# Patient Record
Sex: Male | Born: 1945 | Race: Black or African American | Hispanic: No | Marital: Single | Smoking: Light tobacco smoker
Health system: Southern US, Community
[De-identification: ages and names within clinical notes are randomized; demographics above are authoritative.]

## PROBLEM LIST (undated history)

## (undated) DIAGNOSIS — I10 Essential (primary) hypertension: Secondary | ICD-10-CM

## (undated) DIAGNOSIS — F0390 Unspecified dementia without behavioral disturbance: Secondary | ICD-10-CM

## (undated) DIAGNOSIS — Z978 Presence of other specified devices: Secondary | ICD-10-CM

## (undated) HISTORY — PX: CATARACT EXTRACTION, BILATERAL: SHX1313

## (undated) HISTORY — PX: KNEE SURGERY: SHX244

---

## 2020-07-03 ENCOUNTER — Ambulatory Visit (INDEPENDENT_AMBULATORY_CARE_PROVIDER_SITE_OTHER): Payer: Medicare HMO

## 2020-07-03 ENCOUNTER — Encounter (HOSPITAL_COMMUNITY): Payer: Self-pay

## 2020-07-03 ENCOUNTER — Other Ambulatory Visit: Payer: Self-pay

## 2020-07-03 ENCOUNTER — Ambulatory Visit (HOSPITAL_COMMUNITY)
Admission: EM | Admit: 2020-07-03 | Discharge: 2020-07-03 | Disposition: A | Discharge status: Home / Self Care | Payer: Medicare HMO | Attending: Family Medicine | Admitting: Family Medicine

## 2020-07-03 DIAGNOSIS — R2231 Localized swelling, mass and lump, right upper limb: Secondary | ICD-10-CM

## 2020-07-03 DIAGNOSIS — M79641 Pain in right hand: Secondary | ICD-10-CM

## 2020-07-03 HISTORY — DX: Essential (primary) hypertension: I10

## 2020-07-03 MED ORDER — PREDNISONE 20 MG PO TABS: 40.0000 mg | ORAL_TABLET | Freq: Every day | ORAL | 0 refills | Status: DC

## 2020-07-03 MED ORDER — ACETAMINOPHEN 500 MG PO TABS: ORAL_TABLET | ORAL | 0 refills | Status: DC

## 2020-07-03 NOTE — ED Provider Notes (Signed)
[facility]   [id number] 07/03/20 Arrival Time: 1135  ASSESSMENT & PLAN:  1. Right hand pain     I have personally viewed the imaging studies ordered this visit. Apparent healed/-ing 5th metacarpal fracture.  Likely arthritic hand pain also with swelling.  Begin: Meds ordered this encounter  Medications  . predniSONE (DELTASONE) 20 MG tablet    Sig: Take 2 tablets (40 mg total) by mouth daily.    Dispense:  10 tablet    Refill:  0  . acetaminophen (TYLENOL) 500 MG tablet    Sig: Take no more than 1-2 tablets every 6 hours as needed for pain.    Dispense:  20 tablet    Refill:  0     Orders Placed This Encounter  Procedures  . DG Hand Complete Right    Recommend:    Follow-up Information    Betha Loa, MD.   Specialty: Orthopedic Surgery Why: If worsening or failing to improve as anticipated. Contact information: [address] [place] [address] [phone]                Reviewed expectations re: course of current medical issues. Questions answered. Outlined signs and symptoms indicating need for more acute intervention. Patient verbalized understanding. After Visit Summary given.  SUBJECTIVE: History from: patient and caregiver. Jack Rivas is a 74 y.o. male who reports R hand pain; fall one m ago; on/off soreness since; noted swelling worse since yesterday; is painful. No extremity sensation changes or weakness. OTC pain cream without much help.   Past Surgical History:  Procedure Laterality Date  . CATARACT EXTRACTION, BILATERAL    . KNEE SURGERY        OBJECTIVE:  Vitals:   07/03/20 1308  BP: 107/64  Pulse: 64  Resp: 18  Temp: 97.8 F (36.6 C)  TempSrc: Oral  SpO2: 100%    General appearance: alert; no distress HEENT: [place]; AT Neck: supple with FROM Resp: unlabored respirations Extremities: . RUE: warm with well perfused appearance; poorly localized moderate tenderness over right dorsal proximal hand;  without gross deformities; swelling: minimal; bruising: none; wrist and all fingers ROM: normal CV: brisk extremity capillary refill of RUE; 2+ radial pulse of RUE. Skin: warm and dry; no visible rashes Neurologic: gait normal; normal sensation and strength of RUE Psychological: alert and cooperative; normal mood and affect  Imaging: DG Hand Complete Right  Result Date: 07/03/2020 CLINICAL DATA:  Trauma 1 month ago.  Swelling EXAM: RIGHT HAND - COMPLETE 3+ VIEW COMPARISON:  None. FINDINGS: No acute fracture or dislocation. Mild cortical irregularity of the fifth metacarpal consistent with the sequela of remote prior fracture. Well corticated ossific density adjacent to the ulnar styloid process consistent with sequela of remote prior trauma. Moderate degenerative changes of the first CMC and first MCP. Moderate degenerative changes of the second and third MCPs with hook osteophyte formation and subcortical cyst formation. Scattered degenerative changes throughout the DIPs greater than the PIPs, most pronounced in the fifth phalanx. Scattered subcortical cysts throughout the carpal bones. 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 No unexpected radiopaque foreign body. Vascular calcifications. Soft tissue edema. IMPRESSION: 1. No acute fracture or dislocation. 2. Multifocal degenerative changes including degenerative changes of the second and third MCPs. 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 Electronically Signed   By: Meda Klinefelter MD   On: 07/03/2020 14:05      No Known Allergies  Past Medical History:  Diagnosis Date  . Hypertension    Social History  Socioeconomic History  . Marital status: Single    Spouse name: Not on file  . Number of children: Not on file  . Years of education: Not on file  . Highest education level: Not on file  Occupational History  . Not on file  Tobacco Use  . Smoking status: Light Tobacco Smoker  . Smokeless tobacco: Never Used  Substance and Sexual  Activity  . Alcohol use: Yes  . Drug use: Never  . Sexual activity: Not on file  Other Topics Concern  . Not on file  Social History Narrative  . Not on file   Social Determinants of Health   Financial Resource Strain: Not on file  Food Insecurity: Not on file  Transportation Needs: Not on file  Physical Activity: Not on file  Stress: Not on file  Social Connections: Not on file   History reviewed. No pertinent family history. Past Surgical History:  Procedure Laterality Date  . CATARACT EXTRACTION, BILATERAL    . KNEE SURGERY        Mardella Layman, MD 07/03/20 1446

## 2020-07-03 NOTE — ED Triage Notes (Signed)
Pt presents with pain and swelling in the right wrist and right hand x 1 month. Pt states he fell 1 month ago and tried to catch himself with he hands. Pt used OTC pain creme, without relief.

## 2021-03-19 ENCOUNTER — Emergency Department (HOSPITAL_COMMUNITY): Payer: Medicare Other

## 2021-03-19 ENCOUNTER — Other Ambulatory Visit: Payer: Self-pay

## 2021-03-19 ENCOUNTER — Inpatient Hospital Stay (HOSPITAL_COMMUNITY)
Admission: EM | Admit: 2021-03-19 | Discharge: 2021-03-28 | DRG: 177 | Disposition: A | Discharge status: Skilled Nursing Facility | Payer: Medicare Other | Source: Skilled Nursing Facility | Attending: Internal Medicine | Admitting: Internal Medicine

## 2021-03-19 DIAGNOSIS — Z781 Physical restraint status: Secondary | ICD-10-CM

## 2021-03-19 DIAGNOSIS — F05 Delirium due to known physiological condition: Secondary | ICD-10-CM | POA: Diagnosis present

## 2021-03-19 DIAGNOSIS — W19XXXA Unspecified fall, initial encounter: Secondary | ICD-10-CM

## 2021-03-19 DIAGNOSIS — E162 Hypoglycemia, unspecified: Secondary | ICD-10-CM | POA: Diagnosis present

## 2021-03-19 DIAGNOSIS — Z931 Gastrostomy status: Secondary | ICD-10-CM

## 2021-03-19 DIAGNOSIS — N179 Acute kidney failure, unspecified: Secondary | ICD-10-CM | POA: Diagnosis present

## 2021-03-19 DIAGNOSIS — Z79899 Other long term (current) drug therapy: Secondary | ICD-10-CM

## 2021-03-19 DIAGNOSIS — E440 Moderate protein-calorie malnutrition: Secondary | ICD-10-CM | POA: Diagnosis present

## 2021-03-19 DIAGNOSIS — R059 Cough, unspecified: Secondary | ICD-10-CM

## 2021-03-19 DIAGNOSIS — R1310 Dysphagia, unspecified: Secondary | ICD-10-CM

## 2021-03-19 DIAGNOSIS — R5381 Other malaise: Secondary | ICD-10-CM | POA: Diagnosis present

## 2021-03-19 DIAGNOSIS — F419 Anxiety disorder, unspecified: Secondary | ICD-10-CM | POA: Diagnosis present

## 2021-03-19 DIAGNOSIS — J690 Pneumonitis due to inhalation of food and vomit: Principal | ICD-10-CM | POA: Diagnosis present

## 2021-03-19 DIAGNOSIS — Z20822 Contact with and (suspected) exposure to covid-19: Secondary | ICD-10-CM | POA: Diagnosis present

## 2021-03-19 DIAGNOSIS — Z7952 Long term (current) use of systemic steroids: Secondary | ICD-10-CM

## 2021-03-19 DIAGNOSIS — G9341 Metabolic encephalopathy: Secondary | ICD-10-CM | POA: Diagnosis present

## 2021-03-19 DIAGNOSIS — R680 Hypothermia, not associated with low environmental temperature: Secondary | ICD-10-CM | POA: Diagnosis present

## 2021-03-19 DIAGNOSIS — F0390 Unspecified dementia without behavioral disturbance: Secondary | ICD-10-CM | POA: Diagnosis present

## 2021-03-19 DIAGNOSIS — F17200 Nicotine dependence, unspecified, uncomplicated: Secondary | ICD-10-CM | POA: Diagnosis present

## 2021-03-19 DIAGNOSIS — R4182 Altered mental status, unspecified: Secondary | ICD-10-CM

## 2021-03-19 LAB — CBC
HCT: 32 % — ABNORMAL LOW (ref 39.0–52.0)
Hemoglobin: 10 g/dL — ABNORMAL LOW (ref 13.0–17.0)
MCH: 29.3 pg (ref 26.0–34.0)
MCHC: 31.3 g/dL (ref 30.0–36.0)
MCV: 93.8 fL (ref 80.0–100.0)
Platelets: 162 10*3/uL (ref 150–400)
RBC: 3.41 MIL/uL — ABNORMAL LOW (ref 4.22–5.81)
RDW: 13.3 % (ref 11.5–15.5)
WBC: 6.1 10*3/uL (ref 4.0–10.5)
nRBC: 0 % (ref 0.0–0.2)

## 2021-03-19 LAB — RESP PANEL BY RT-PCR (FLU A&B, COVID) ARPGX2
Influenza A by PCR: NEGATIVE
Influenza B by PCR: NEGATIVE
SARS Coronavirus 2 by RT PCR: NEGATIVE

## 2021-03-19 LAB — CBG MONITORING, ED: Glucose-Capillary: 107 mg/dL — ABNORMAL HIGH (ref 70–99)

## 2021-03-19 LAB — COMPREHENSIVE METABOLIC PANEL
ALT: 91 U/L — ABNORMAL HIGH (ref 0–44)
AST: 53 U/L — ABNORMAL HIGH (ref 15–41)
Albumin: 3.5 g/dL (ref 3.5–5.0)
Alkaline Phosphatase: 71 U/L (ref 38–126)
Anion gap: 8 (ref 5–15)
BUN: 52 mg/dL — ABNORMAL HIGH (ref 8–23)
CO2: 25 mmol/L (ref 22–32)
Calcium: 9.6 mg/dL (ref 8.9–10.3)
Chloride: 109 mmol/L (ref 98–111)
Creatinine, Ser: 2.07 mg/dL — ABNORMAL HIGH (ref 0.61–1.24)
GFR, Estimated: 33 mL/min — ABNORMAL LOW (ref >60)
Glucose, Bld: 97 mg/dL (ref 70–99)
Potassium: 6.2 mmol/L — ABNORMAL HIGH (ref 3.5–5.1)
Sodium: 142 mmol/L (ref 135–145)
Total Bilirubin: 0.8 mg/dL (ref 0.3–1.2)
Total Protein: 8.1 g/dL (ref 6.5–8.1)

## 2021-03-19 LAB — LIPASE, BLOOD: Lipase: 30 U/L (ref 11–51)

## 2021-03-19 LAB — LACTIC ACID, PLASMA: Lactic Acid, Venous: 1.1 mmol/L (ref 0.5–1.9)

## 2021-03-19 MED ORDER — IOHEXOL 350 MG/ML SOLN
70.0000 mL | Freq: Once | INTRAVENOUS | Status: AC | PRN
  Administered 2021-03-20: 70 mL via INTRAVENOUS

## 2021-03-19 NOTE — ED Provider Notes (Signed)
[facility]-EMERGENCY DEPT Provider Note   CSN: [id number] Arrival date & time: 03/19/21  2050     History Chief Complaint  Patient presents with   Aspiration    Jack Rivas is a 75 y.o. male.  The history is provided by medical records. The history is limited by the condition of the patient. No language interpreter was used.  Cough Cough characteristics:  Harsh Severity:  Severe Timing:  Unable to specify Progression:  Unable to specify Relieved by:  Nothing Worsened by:  Nothing Ineffective treatments:  None tried Associated symptoms: no fever and no rash    LVL 5 caveat for AMS     Past Medical History:  Diagnosis Date   Hypertension     There are no problems to display for this patient.   Past Surgical History:  Procedure Laterality Date   CATARACT EXTRACTION, BILATERAL     KNEE SURGERY         No family history on file.  Social History   Tobacco Use   Smoking status: Light Smoker   Smokeless tobacco: Never  Substance Use Topics   Alcohol use: Yes   Drug use: Never    Home Medications Prior to Admission medications   Medication Sig Start Date End Date Taking? Authorizing Provider  acetaminophen (TYLENOL) 500 MG tablet Take no more than 1-2 tablets every 6 hours as needed for pain. 07/03/20   Mardella Layman, MD  amLODipine (NORVASC) 10 MG tablet Take 10 mg by mouth daily. 05/08/20   [provider]  losartan (COZAAR) 50 MG tablet Take 50 mg by mouth daily. 05/12/20   [provider]  predniSONE (DELTASONE) 20 MG tablet Take 2 tablets (40 mg total) by mouth daily. 07/03/20   Mardella Layman, MD    Allergies    Patient has no known allergies.  Review of Systems   Review of Systems  Unable to perform ROS: Mental status change  Constitutional:  Negative for fever.  Respiratory:  Positive for cough.   Gastrointestinal:  Positive for abdominal pain. Negative for nausea and vomiting.  Skin:  Negative for rash  and wound.  Neurological:  Positive for speech difficulty.  Psychiatric/Behavioral:  Negative for agitation.    Physical Exam Updated Vital Signs BP (!) 188/90 (BP Location: Right Arm)   Pulse 80   Temp 98 F (36.7 C) (Oral)   Resp (!) 22   SpO2 99%   Physical Exam Vitals and nursing note reviewed.  Constitutional:      Appearance: He is well-developed.  HENT:     Head: Normocephalic and atraumatic.     Nose: No congestion.     Mouth/Throat:     Mouth: Mucous membranes are moist.  Eyes:     Extraocular Movements: Extraocular movements intact.     Conjunctiva/sclera: Conjunctivae normal.     Pupils: Pupils are equal, round, and reactive to light.  Cardiovascular:     Rate and Rhythm: Normal rate and regular rhythm.     Heart sounds: No murmur heard. Pulmonary:     Effort: Pulmonary effort is normal. No respiratory distress.     Breath sounds: Normal breath sounds.  Abdominal:     General: Abdomen is flat.     Palpations: Abdomen is soft.     Tenderness: There is abdominal tenderness. There is no guarding or rebound.     Comments: Peg tube in place  Musculoskeletal:        General: No tenderness.  Cervical back: Neck supple. No tenderness.  Skin:    General: Skin is warm and dry.  Neurological:     Mental Status: He is alert.     GCS: GCS eye subscore is 4. GCS verbal subscore is 1. GCS motor subscore is 6.     Comments: Patient is essentially nonverbal at this time and family reports this is new for him over the last few days.  Patient appears to have sensation all extremities and would move all extremities for me    ED Results / Procedures / Treatments   Labs (all labs ordered are listed, but only abnormal results are displayed) Labs Reviewed  COMPREHENSIVE METABOLIC PANEL - Abnormal; Notable for the following components:      Result Value   Potassium 6.2 (*)    BUN 52 (*)    Creatinine, Ser 2.07 (*)    AST 53 (*)    ALT 91 (*)    GFR, Estimated 33 (*)     All other components within normal limits  CBC - Abnormal; Notable for the following components:   RBC 3.41 (*)    Hemoglobin 10.0 (*)    HCT 32.0 (*)    All other components within normal limits  TSH - Abnormal; Notable for the following components:   TSH 5.644 (*)    All other components within normal limits  CBG MONITORING, ED - Abnormal; Notable for the following components:   Glucose-Capillary 107 (*)    All other components within normal limits  RESP PANEL BY RT-PCR (FLU A&B, COVID) ARPGX2  CULTURE, BLOOD (ROUTINE X 2)  CULTURE, BLOOD (ROUTINE X 2)  URINE CULTURE  LACTIC ACID, PLASMA  LIPASE, BLOOD  URINALYSIS, ROUTINE W REFLEX MICROSCOPIC    EKG EKG Interpretation  Date/Time:  Saturday March 19 2021 22:37:29 EDT Ventricular Rate:  74 PR Interval:    QRS Duration: 113 QT Interval:  377 QTC Calculation: 419 R Axis:   5 Text Interpretation: Atrial fibrillation Borderline intraventricular conduction delay Borderline abnrm T, anterolateral leads NO prior ECG for comparison. No STEMI Confirmed by Jack Rivas ([id number]) on 03/19/2021 10:44:13 PM  Radiology DG Chest Portable 1 View  Result Date: 03/19/2021 CLINICAL DATA:  Concern for aspiration. Possible altered mental status. Abdominal pain. Lethargy. Cough. EXAM: PORTABLE CHEST 1 VIEW COMPARISON:  None. FINDINGS: Lung volumes are low. The heart is enlarged. Aortic tortuosity. Minor atelectasis at the right lung base. No confluent consolidation. No pleural effusion or pneumothorax. No pulmonary edema. Left humeral head arthroplasty is partially included. IMPRESSION: Low lung volumes with mild atelectasis at the right lung base. Electronically Signed   By: Narda Rutherford M.D.   On: 03/19/2021 22:52    Procedures Procedures   Medications Ordered in ED Medications  iohexol (OMNIPAQUE) 350 MG/ML injection 70 mL (70 mLs Intravenous Contrast Given 03/20/21 0001)    ED Course  I have reviewed the triage vital signs and the  nursing notes.  Pertinent labs & imaging results that were available during my care of the patient were reviewed by me and considered in my medical decision making (see chart for details).    MDM Rules/Calculators/A&P                           Jack Rivas is a 75 y.o. male with a past medical history significant for hypertension, dementia, dysphagia with PEG tube who presents from [facility] facility for concern of cough/aspiration and altered mental  status.  According to EMS report, patient was found to be altered from his baseline but it is unclear the context.  The facility was concerned about coughing and that he may have aspirated as he was fed something orally.  He does have a PEG tube.  Patient is unable to answer any questions and is unclear what his baseline is.  I called the facility multiple times and unfortunately was unable to speak to someone who could provide further information.  On exam, lungs are coarse bilaterally.  Chest is nontender but abdomen is very tender diffusely.  He does have a PEG tube that appears to be in place with no significant drainage surrounding.  Patient is moving extremities and has pupils that are 3 mm and reactive bilaterally.  He is not speaking.  He is keeping his mouth open and was gurgling saliva.  This is concerning for ongoing aspiration.  We were able to suction out his mouth and this improved however he still not answer any questions.  Clinically I am concerned that if he had anything his mouth may have aspirated so we will get a chest x-ray.  With his abdominal tenderness on exam and there is question of altered mental status, will order CT scan to further evaluate given his PEG tube.  We will get other basic labs, COVID swab, and work-up started.  Will start work-up for questionable altered mental status and possible aspiration without much other information at this time  11:01 PM The patient's daughter arrived who she reports is the  healthcare power of attorney.  She says that when patient was discharged from [facility] 5 days ago and went to [facility], he was conversant able to answer questions.  She does say that his dementia makes it hard to talk at times but he normally is conversational.  Per daughter, patient is now not answering any questions and is not his normal speech pattern or baseline.  We will get a head CT.  The last time she spoke with him was several days ago she reports.  She also says that although he has a DNR piece of paper signed with him, he is not DNR and she does not know anything about this paperwork.  She reports that the last time she spoke to him about this he wanted to be full code.  She subsequently remove the DNR form from the room.  As she reports she is the healthcare power attorney and says that he is not DNR, patient will remain full code.  She is concerned that she has seen patient eating food that he was not supposed to.  She says that he is supposed to be using his PEG tube and can only certain foods that are thickened.  It is not clear what thickened foods he is allowed to eat.  She suspects that he is eating candy and may have choked or aspirated.  Given our concern for aspiration, will continue to get chest x-ray as well as a CT abdomen pelvis given the tenderness.  We will add on a head CT to look for acute intracranial abnormality however anticipate he will need admission for altered mental status, speech abnormalities, and presumed aspiration.  Due to the extremely coarse breath sounds and the discomfort in the upper abdomen and diffuse in the abdomen, will get CT chest/abdomen/pelvis to look for evidence of pneumonia, further aspiration, or other abnormalities.  Care transferred to oncoming team while waiting for work-up.  Anticipate admission for  altered mental status and possible aspiration after work-up is completed.   Final Clinical Impression(s) / ED Diagnoses Final diagnoses:   Cough  Altered mental status, unspecified altered mental status type  Fall, initial encounter    Clinical Impression: 1. Cough   2. Altered mental status, unspecified altered mental status type   3. Fall, initial encounter     Disposition: Care transferred to oncoming team while waiting for work-up.  Anticipate admission for altered mental status and possible aspiration after work-up is completed.  This note was prepared with assistance of Conservation officer, historic buildingsDragon voice recognition software. Occasional wrong-word or sound-a-like substitutions may have occurred due to the inherent limitations of voice recognition software.     Caprice Wasko, Canary Brimhristopher J, MD 03/20/21 [phone]

## 2021-03-19 NOTE — ED Triage Notes (Signed)
PT BIB EMS from SNF [facility]) c/o aspiration. Per report pt was found lethargic in his room and have a horrible cough. Per report pt was on soft diet in facility and unsure if they're feeding the pt orally or via G tube. Pt poor historian. Pt hx of dementia.   CBG 178 BP 160/94 HR 78 RR 22 O2sat 100%

## 2021-03-20 ENCOUNTER — Inpatient Hospital Stay (HOSPITAL_COMMUNITY): Payer: Medicare Other

## 2021-03-20 DIAGNOSIS — Z781 Physical restraint status: Secondary | ICD-10-CM | POA: Diagnosis not present

## 2021-03-20 DIAGNOSIS — Z7189 Other specified counseling: Secondary | ICD-10-CM | POA: Diagnosis not present

## 2021-03-20 DIAGNOSIS — Z20822 Contact with and (suspected) exposure to covid-19: Secondary | ICD-10-CM | POA: Diagnosis present

## 2021-03-20 DIAGNOSIS — F419 Anxiety disorder, unspecified: Secondary | ICD-10-CM | POA: Diagnosis present

## 2021-03-20 DIAGNOSIS — Z931 Gastrostomy status: Secondary | ICD-10-CM | POA: Diagnosis not present

## 2021-03-20 DIAGNOSIS — R680 Hypothermia, not associated with low environmental temperature: Secondary | ICD-10-CM | POA: Diagnosis present

## 2021-03-20 DIAGNOSIS — N179 Acute kidney failure, unspecified: Secondary | ICD-10-CM | POA: Diagnosis present

## 2021-03-20 DIAGNOSIS — G9341 Metabolic encephalopathy: Secondary | ICD-10-CM | POA: Diagnosis present

## 2021-03-20 DIAGNOSIS — R1310 Dysphagia, unspecified: Secondary | ICD-10-CM | POA: Diagnosis not present

## 2021-03-20 DIAGNOSIS — J690 Pneumonitis due to inhalation of food and vomit: Secondary | ICD-10-CM

## 2021-03-20 DIAGNOSIS — F17200 Nicotine dependence, unspecified, uncomplicated: Secondary | ICD-10-CM | POA: Diagnosis present

## 2021-03-20 DIAGNOSIS — E162 Hypoglycemia, unspecified: Secondary | ICD-10-CM | POA: Diagnosis present

## 2021-03-20 DIAGNOSIS — Z79899 Other long term (current) drug therapy: Secondary | ICD-10-CM | POA: Diagnosis not present

## 2021-03-20 DIAGNOSIS — F0390 Unspecified dementia without behavioral disturbance: Secondary | ICD-10-CM | POA: Diagnosis present

## 2021-03-20 DIAGNOSIS — E440 Moderate protein-calorie malnutrition: Secondary | ICD-10-CM | POA: Diagnosis present

## 2021-03-20 DIAGNOSIS — Z7952 Long term (current) use of systemic steroids: Secondary | ICD-10-CM | POA: Diagnosis not present

## 2021-03-20 DIAGNOSIS — F05 Delirium due to known physiological condition: Secondary | ICD-10-CM | POA: Diagnosis present

## 2021-03-20 DIAGNOSIS — R5381 Other malaise: Secondary | ICD-10-CM | POA: Diagnosis present

## 2021-03-20 DIAGNOSIS — R059 Cough, unspecified: Secondary | ICD-10-CM | POA: Diagnosis present

## 2021-03-20 DIAGNOSIS — F0391 Unspecified dementia with behavioral disturbance: Secondary | ICD-10-CM | POA: Diagnosis not present

## 2021-03-20 HISTORY — DX: Pneumonitis due to inhalation of food and vomit: J69.0

## 2021-03-20 LAB — COMPREHENSIVE METABOLIC PANEL
ALT: 87 U/L — ABNORMAL HIGH (ref 0–44)
AST: 43 U/L — ABNORMAL HIGH (ref 15–41)
Albumin: 3.3 g/dL — ABNORMAL LOW (ref 3.5–5.0)
Alkaline Phosphatase: 67 U/L (ref 38–126)
Anion gap: 6 (ref 5–15)
BUN: 47 mg/dL — ABNORMAL HIGH (ref 8–23)
CO2: 25 mmol/L (ref 22–32)
Calcium: 9.3 mg/dL (ref 8.9–10.3)
Chloride: 111 mmol/L (ref 98–111)
Creatinine, Ser: 1.88 mg/dL — ABNORMAL HIGH (ref 0.61–1.24)
GFR, Estimated: 37 mL/min — ABNORMAL LOW (ref >60)
Glucose, Bld: 66 mg/dL — ABNORMAL LOW (ref 70–99)
Potassium: 5.2 mmol/L — ABNORMAL HIGH (ref 3.5–5.1)
Sodium: 142 mmol/L (ref 135–145)
Total Bilirubin: 0.4 mg/dL (ref 0.3–1.2)
Total Protein: 7.6 g/dL (ref 6.5–8.1)

## 2021-03-20 LAB — CBC
HCT: 30.3 % — ABNORMAL LOW (ref 39.0–52.0)
Hemoglobin: 9.8 g/dL — ABNORMAL LOW (ref 13.0–17.0)
MCH: 29.5 pg (ref 26.0–34.0)
MCHC: 32.3 g/dL (ref 30.0–36.0)
MCV: 91.3 fL (ref 80.0–100.0)
Platelets: 150 10*3/uL (ref 150–400)
RBC: 3.32 MIL/uL — ABNORMAL LOW (ref 4.22–5.81)
RDW: 13.2 % (ref 11.5–15.5)
WBC: 5.2 10*3/uL (ref 4.0–10.5)
nRBC: 0 % (ref 0.0–0.2)

## 2021-03-20 LAB — URINALYSIS, ROUTINE W REFLEX MICROSCOPIC
Bacteria, UA: NONE SEEN
Bilirubin Urine: NEGATIVE
Glucose, UA: NEGATIVE mg/dL
Ketones, ur: NEGATIVE mg/dL
Leukocytes,Ua: NEGATIVE
Nitrite: NEGATIVE
Protein, ur: 30 mg/dL — AB
RBC / HPF: >50 RBC/hpf — ABNORMAL HIGH (ref 0–5)
Specific Gravity, Urine: <1.005 — ABNORMAL LOW (ref 1.005–1.030)
pH: 7 (ref 5.0–8.0)

## 2021-03-20 LAB — GLUCOSE, CAPILLARY
Glucose-Capillary: 63 mg/dL — ABNORMAL LOW (ref 70–99)
Glucose-Capillary: 73 mg/dL (ref 70–99)
Glucose-Capillary: 76 mg/dL (ref 70–99)

## 2021-03-20 LAB — BLOOD GAS, VENOUS
Acid-Base Excess: 0.4 mmol/L (ref 0.0–2.0)
Bicarbonate: 26.7 mmol/L (ref 20.0–28.0)
O2 Saturation: 49.1 %
Patient temperature: 98.6
pCO2, Ven: 54.8 mmHg (ref 44.0–60.0)
pH, Ven: 7.308 (ref 7.250–7.430)
pO2, Ven: 29 mmHg — CL (ref 32.0–45.0)

## 2021-03-20 LAB — AMMONIA: Ammonia: 17 umol/L (ref 9–35)

## 2021-03-20 LAB — TSH: TSH: 5.644 u[IU]/mL — ABNORMAL HIGH (ref 0.350–4.500)

## 2021-03-20 LAB — STREP PNEUMONIAE URINARY ANTIGEN: Strep Pneumo Urinary Antigen: NEGATIVE

## 2021-03-20 LAB — POTASSIUM: Potassium: 5 mmol/L (ref 3.5–5.1)

## 2021-03-20 LAB — HIV ANTIBODY (ROUTINE TESTING W REFLEX): HIV Screen 4th Generation wRfx: NONREACTIVE

## 2021-03-20 LAB — T4, FREE: Free T4: 0.85 ng/dL (ref 0.61–1.12)

## 2021-03-20 MED ORDER — SODIUM ZIRCONIUM CYCLOSILICATE 10 G PO PACK
10.0000 g | PACK | Freq: Every day | ORAL | Status: DC
  Administered 2021-03-20: 10 g
  Filled 2021-03-20: qty 1

## 2021-03-20 MED ORDER — HEPARIN SODIUM (PORCINE) 5000 UNIT/ML IJ SOLN
5000.0000 [IU] | Freq: Three times a day (TID) | INTRAMUSCULAR | Status: DC
  Administered 2021-03-20 – 2021-03-28 (×24): 5000 [IU] via SUBCUTANEOUS
  Filled 2021-03-20 (×25): qty 1

## 2021-03-20 MED ORDER — MELATONIN 3 MG PO TABS
6.0000 mg | ORAL_TABLET | Freq: Every day | ORAL | Status: DC
  Administered 2021-03-20 – 2021-03-27 (×8): 6 mg
  Filled 2021-03-20 (×7): qty 2

## 2021-03-20 MED ORDER — SODIUM CHLORIDE 0.9 % IV SOLN
500.0000 mg | INTRAVENOUS | Status: AC
  Administered 2021-03-21 – 2021-03-24 (×4): 500 mg via INTRAVENOUS
  Filled 2021-03-20 (×4): qty 500

## 2021-03-20 MED ORDER — HALOPERIDOL LACTATE 5 MG/ML IJ SOLN
2.0000 mg | Freq: Once | INTRAMUSCULAR | Status: AC
  Administered 2021-03-20: 2 mg via INTRAVENOUS
  Filled 2021-03-20: qty 1

## 2021-03-20 MED ORDER — MELATONIN 3 MG PO TABS
6.0000 mg | ORAL_TABLET | Freq: Every day | ORAL | Status: DC
  Filled 2021-03-20: qty 2

## 2021-03-20 MED ORDER — FAMOTIDINE 20 MG PO TABS
20.0000 mg | ORAL_TABLET | Freq: Every day | ORAL | Status: DC
  Administered 2021-03-20 – 2021-03-27 (×8): 20 mg
  Filled 2021-03-20 (×8): qty 1

## 2021-03-20 MED ORDER — LOSARTAN POTASSIUM 50 MG PO TABS
50.0000 mg | ORAL_TABLET | Freq: Every day | ORAL | Status: DC
  Administered 2021-03-20: 50 mg

## 2021-03-20 MED ORDER — AMLODIPINE BESYLATE 10 MG PO TABS
10.0000 mg | ORAL_TABLET | Freq: Every day | ORAL | Status: DC
  Administered 2021-03-20: 10 mg

## 2021-03-20 MED ORDER — SODIUM CHLORIDE 0.9 % IV SOLN
1.0000 g | INTRAVENOUS | Status: AC
  Administered 2021-03-21 – 2021-03-24 (×4): 1 g via INTRAVENOUS
  Filled 2021-03-20: qty 1
  Filled 2021-03-20 (×3): qty 10

## 2021-03-20 MED ORDER — OSMOLITE 1.2 CAL PO LIQD
1000.0000 mL | ORAL | Status: DC
  Administered 2021-03-20 – 2021-03-21 (×3): 1000 mL
  Filled 2021-03-20 (×2): qty 1000

## 2021-03-20 MED ORDER — SODIUM CHLORIDE 0.9 % IV SOLN
2.0000 g | Freq: Once | INTRAVENOUS | Status: AC
  Administered 2021-03-20: 2 g via INTRAVENOUS
  Filled 2021-03-20: qty 20

## 2021-03-20 MED ORDER — AMLODIPINE BESYLATE 10 MG PO TABS
10.0000 mg | ORAL_TABLET | Freq: Every day | ORAL | Status: DC
  Filled 2021-03-20: qty 1

## 2021-03-20 MED ORDER — LOSARTAN POTASSIUM 50 MG PO TABS
50.0000 mg | ORAL_TABLET | Freq: Every day | ORAL | Status: DC
  Filled 2021-03-20: qty 1

## 2021-03-20 MED ORDER — QUETIAPINE FUMARATE 50 MG PO TABS
25.0000 mg | ORAL_TABLET | Freq: Every day | ORAL | Status: DC
  Administered 2021-03-20: 25 mg

## 2021-03-20 MED ORDER — POLYETHYLENE GLYCOL 3350 17 G PO PACK
17.0000 g | PACK | Freq: Two times a day (BID) | ORAL | Status: DC
  Administered 2021-03-20 – 2021-03-26 (×14): 17 g
  Filled 2021-03-20 (×14): qty 1

## 2021-03-20 MED ORDER — SODIUM CHLORIDE 0.9 % IV SOLN
500.0000 mg | Freq: Once | INTRAVENOUS | Status: AC
  Administered 2021-03-20: 500 mg via INTRAVENOUS
  Filled 2021-03-20: qty 500

## 2021-03-20 MED ORDER — SODIUM CHLORIDE 0.9 % IV SOLN: INTRAVENOUS | Status: DC

## 2021-03-20 MED ORDER — ORAL CARE MOUTH RINSE
15.0000 mL | Freq: Two times a day (BID) | OROMUCOSAL | Status: DC
  Administered 2021-03-20 – 2021-03-28 (×9): 15 mL via OROMUCOSAL

## 2021-03-20 MED ORDER — QUETIAPINE FUMARATE 25 MG PO TABS
25.0000 mg | ORAL_TABLET | Freq: Every day | ORAL | Status: DC
  Filled 2021-03-20: qty 1

## 2021-03-20 MED ORDER — CHLORHEXIDINE GLUCONATE 0.12 % MT SOLN
15.0000 mL | Freq: Two times a day (BID) | OROMUCOSAL | Status: DC
  Administered 2021-03-20 – 2021-03-28 (×14): 15 mL via OROMUCOSAL
  Filled 2021-03-20 (×12): qty 15

## 2021-03-20 NOTE — ED Notes (Signed)
Patient resting quietly at this time.  Respirations even and unlabored.  Vital signs stable at this time

## 2021-03-20 NOTE — Plan of Care (Signed)

## 2021-03-20 NOTE — Progress Notes (Signed)
Date and time results received: 03/20/21 0825   Test: ABG Po2 Critical Value: 29  Name of Provider Notified: Ghimire  Orders Received? Or Actions Taken?: Actions Taken: O2 at 2L

## 2021-03-20 NOTE — Plan of Care (Signed)

## 2021-03-20 NOTE — H&P (Signed)
TRH H&P    Patient Demographics:    Jack Rivas, is a 75 y.o. male  MRN: [id number]  DOB - 1946-01-26  Admit Date - 03/19/2021  Referring MD/NP/PA: Blinda Leatherwood  Outpatient Primary MD for the patient is Patient, No Pcp Per (Inactive)  Patient coming from: [facility]  Chief complaint- altered mental status   HPI:    Jack Rivas  is a 75 y.o. male, with history of hypertension, dementia, dysphagia with PEG tube presents the ED with a chief complaint of altered mental status.  Patient is nonverbal at this time.  History is obtained from chart.  Apparently at the facility, which she has been at less than a week after being discharged from [facility], patient started experiencing cough and altered mental status.  According to EMS patient was found to be altered from his baseline but is unclear what his baseline is.  Facility was concerned about the cough because they fear he may have aspirated.  Patient had abdominal tenderness on exam with ED provider and with myself.  CT chest abdomen pelvis shows cardiomegaly, mild groundglass opacities in the right lower lobe, colonic diverticulosis, moderate stool burden, no acute findings in the abdomen or pelvis.  Patient is afebrile, did not have a leukocytosis, lactic acid was normal.  At first patient was very somnolent in the ED.  Later he became very agitated and combative requiring a dose of Haldol.  It took several people to even just draw blood.  His chemistry panel showed an elevated BUN at 52 and an elevated creatinine at 2.07.  Last creatinine was 1.93, and baseline seems to be between 1.7 and 1.9.  BUN seems to be around 50 at baseline.  Patient had negative COVID and flu.  AST and ALT were mildly elevated at 53 and 91.  Patient has not been hypoxic in the ED.  Admission requested for further work-up of acute metabolic encephalopathy.    Review of systems:    By unfortunately  review of systems cannot be obtained secondary to patient's altered mental status    Past History of the following :    Past Medical History:  Diagnosis Date   Hypertension       Past Surgical History:  Procedure Laterality Date   CATARACT EXTRACTION, BILATERAL     KNEE SURGERY        Social History:      Social History   Tobacco Use   Smoking status: Light Smoker   Smokeless tobacco: Never  Substance Use Topics   Alcohol use: Yes       Family History :    No family history on file. Unfortunately family history could not be reviewed due to patient's altered mental status   Home Medications:   Prior to Admission medications   Medication Sig Start Date End Date Taking? Authorizing Provider  acetaminophen (TYLENOL) 500 MG tablet Take no more than 1-2 tablets every 6 hours as needed for pain. 07/03/20   Mardella Layman, MD  amLODipine (NORVASC) 10 MG tablet Take 10 mg by  mouth daily. 05/08/20   [provider]  losartan (COZAAR) 50 MG tablet Take 50 mg by mouth daily. 05/12/20   [provider]  predniSONE (DELTASONE) 20 MG tablet Take 2 tablets (40 mg total) by mouth daily. 07/03/20   Mardella LaymanHagler, Brian, MD     Allergies:    No Known Allergies   Physical Exam:   Vitals  Blood pressure (!) 147/59, pulse (!) 56, temperature (!) 97.5 F (36.4 C), temperature source Axillary, resp. rate 15, height 6\' 3"  (1.905 m), weight 85.4 kg, SpO2 98 %.  1.  General: Patient lying supine in bed,  no acute distress   2. Psychiatric: Somnolent and nonverbal,    3. Neurologic: Incomprehensible moaning, face is symmetric, equal strength in upper extremities bilaterally when resisting my exam, not following commands to move lower extremities,    4. HEENMT:  Head is atraumatic, normocephalic, pupils reactive to light, neck is supple, trachea is midline, mucous membranes are mildly dry   5. Respiratory : Lungs with mild rhonchi, no rales, no cyanosis, no  increase in work of breathing or accessory muscle use   6. Cardiovascular : Heart rate normal, rhythm is regular, no murmurs, rubs or gallops, no peripheral edema, peripheral pulses palpated   7. Gastrointestinal:  Abdomen is soft, nondistended but tender to palpation bowel sounds active, no masses or organomegaly palpated, PEG tube in place-dressing clean dry   8. Skin:  Skin is warm, dry and intact without rashes, acute lesions, or ulcers on limited exam   9.Musculoskeletal:  No acute deformities or trauma, no asymmetry in tone, no peripheral edema, peripheral pulses palpated, no tenderness to palpation in the extremities     Data Review:    CBC Recent Labs  Lab 03/19/21 2209  WBC 6.1  HGB 10.0*  HCT 32.0*  PLT 162  MCV 93.8  MCH 29.3  MCHC 31.3  RDW 13.3   ------------------------------------------------------------------------------------------------------------------  Results for orders placed or performed during the hospital encounter of 03/19/21 (from the past 48 hour(s))  Comprehensive metabolic panel     Status: Abnormal   Collection Time: 03/19/21 10:09 PM  Result Value Ref Range   Sodium 142 135 - 145 mmol/L   Potassium 6.2 (H) 3.5 - 5.1 mmol/L   Chloride 109 98 - 111 mmol/L   CO2 25 22 - 32 mmol/L   Glucose, Bld 97 70 - 99 mg/dL    Comment: Glucose reference range applies only to samples taken after fasting for at least 8 hours.   BUN 52 (H) 8 - 23 mg/dL   Creatinine, Ser 1.612.07 (H) 0.61 - 1.24 mg/dL   Calcium 9.6 8.9 - 09.610.3 mg/dL   Total Protein 8.1 6.5 - 8.1 g/dL   Albumin 3.5 3.5 - 5.0 g/dL   AST 53 (H) 15 - 41 U/L   ALT 91 (H) 0 - 44 U/L   Alkaline Phosphatase 71 38 - 126 U/L   Total Bilirubin 0.8 0.3 - 1.2 mg/dL   GFR, Estimated 33 (L) >60 mL/min    Comment: (NOTE) Calculated using the CKD-EPI Creatinine Equation (2021)    Anion gap 8 5 - 15    Comment: Performed at [facility], [address] [place], [place] [id number]   CBC     Status: Abnormal   Collection Time: 03/19/21 10:09 PM  Result Value Ref Range   WBC 6.1 4.0 - 10.5 K/uL   RBC 3.41 (L) 4.22 - 5.81 MIL/uL   Hemoglobin 10.0 (L)  13.0 - 17.0 g/dL   HCT 03.4 (L) 74.2 - 59.5 %   MCV 93.8 80.0 - 100.0 fL   MCH 29.3 26.0 - 34.0 pg   MCHC 31.3 30.0 - 36.0 g/dL   RDW 63.8 75.6 - 43.3 %   Platelets 162 150 - 400 K/uL   nRBC 0.0 0.0 - 0.2 %    Comment: Performed at [facility], [address] [place], [place] [address]  Lipase, blood     Status: None   Collection Time: 03/19/21 10:09 PM  Result Value Ref Range   Lipase 30 11 - 51 U/L    Comment: Performed at [facility], [address], [place], [place] [address]  Resp Panel by RT-PCR (Flu A&B, Covid) Nasopharyngeal Swab     Status: None   Collection Time: 03/19/21 10:09 PM   Specimen: Nasopharyngeal Swab; Nasopharyngeal(NP) swabs in vial transport medium  Result Value Ref Range   SARS Coronavirus 2 by RT PCR NEGATIVE NEGATIVE    Comment: (NOTE) SARS-CoV-2 target nucleic acids are NOT DETECTED.  The SARS-CoV-2 RNA is generally detectable in upper respiratory specimens during the acute phase of infection. The lowest concentration of SARS-CoV-2 viral copies this assay can detect is 138 copies/mL. A negative result does not preclude SARS-Cov-2 infection and should not be used as the sole basis for treatment or other patient management decisions. A negative result may occur with  improper specimen collection/handling, submission of specimen other than nasopharyngeal swab, presence of viral mutation(s) within the areas targeted by this assay, and inadequate number of viral copies(<138 copies/mL). A negative result must be combined with clinical observations, patient history, and epidemiological information. The expected result is Negative.  Fact Sheet for Patients:  BloggerCourse.com  Fact Sheet for Healthcare Providers:   SeriousBroker.it  This test is no t yet approved or cleared by the [place] FDA and  has been authorized for detection and/or diagnosis of SARS-CoV-2 by FDA under an Emergency Use Authorization (EUA). This EUA will remain  in effect (meaning this test can be used) for the duration of the COVID-19 declaration under Section 564(b)(1) of the Act, 21 U.S.C.section 360bbb-3(b)(1), unless the authorization is terminated  or revoked sooner.       Influenza A by PCR NEGATIVE NEGATIVE   Influenza B by PCR NEGATIVE NEGATIVE    Comment: (NOTE) The Xpert Xpress SARS-CoV-2/FLU/RSV plus assay is intended as an aid in the diagnosis of influenza from Nasopharyngeal swab specimens and should not be used as a sole basis for treatment. Nasal washings and aspirates are unacceptable for Xpert Xpress SARS-CoV-2/FLU/RSV testing.  Fact Sheet for Patients: BloggerCourse.com  Fact Sheet for Healthcare Providers: SeriousBroker.it  This test is not yet approved or cleared by the [place] FDA and has been authorized for detection and/or diagnosis of SARS-CoV-2 by FDA under an Emergency Use Authorization (EUA). This EUA will remain in effect (meaning this test can be used) for the duration of the COVID-19 declaration under Section 564(b)(1) of the Act, 21 U.S.C. section 360bbb-3(b)(1), unless the authorization is terminated or revoked.  Performed at [facility], [address] [place], [place] [address]   TSH     Status: Abnormal   Collection Time: 03/19/21 10:24 PM  Result Value Ref Range   TSH 5.644 (H) 0.350 - 4.500 uIU/mL    Comment: Performed by a 3rd Generation assay with a functional sensitivity of <=0.01 uIU/mL. Performed at [facility], [address] [place], [place] [address]   Lactic acid, plasma     Status: None   Collection Time: 03/19/21 10:26 PM  Result  Value Ref Range   Lactic Acid, Venous 1.1 0.5 - 1.9 mmol/L    Comment: Performed at [facility], [address], [place], [place] [address]  CBG monitoring, ED     Status: Abnormal   Collection Time: 03/19/21 10:47 PM  Result Value Ref Range   Glucose-Capillary 107 (H) 70 - 99 mg/dL    Comment: Glucose reference range applies only to samples taken after fasting for at least 8 hours.  Urinalysis, Routine w reflex microscopic Urine, Clean Catch     Status: Abnormal   Collection Time: 03/20/21  1:51 AM  Result Value Ref Range   Color, Urine YELLOW (A) YELLOW   APPearance CLEAR (A) CLEAR   Specific Gravity, Urine <1.005 (L) 1.005 - 1.030   pH 7.0 5.0 - 8.0   Glucose, UA NEGATIVE NEGATIVE mg/dL   Hgb urine dipstick LARGE (A) NEGATIVE   Bilirubin Urine NEGATIVE NEGATIVE   Ketones, ur NEGATIVE NEGATIVE mg/dL   Protein, ur 30 (A) NEGATIVE mg/dL   Nitrite NEGATIVE NEGATIVE   Leukocytes,Ua NEGATIVE NEGATIVE   RBC / HPF >50 (H) 0 - 5 RBC/hpf   WBC, UA 0-5 0 - 5 WBC/hpf   Bacteria, UA NONE SEEN NONE SEEN   Squamous Epithelial / LPF 0-5 0 - 5   Mucus PRESENT     Comment: Performed at [facility], [address] [place], [place] [address]  Potassium     Status: None   Collection Time: 03/20/21  2:51 AM  Result Value Ref Range   Potassium 5.0 3.5 - 5.1 mmol/L    Comment: DELTA CHECK NOTED Performed at [facility], [address] [place], [place] [address]     Chemistries  Recent Labs  Lab 03/19/21 2209 03/20/21 0251  NA 142  --   K 6.2* 5.0  CL 109  --   CO2 25  --   GLUCOSE 97  --   BUN 52*  --   CREATININE 2.07*  --   CALCIUM 9.6  --   AST 53*  --   ALT 91*  --   ALKPHOS 71  --   BILITOT 0.8  --     ------------------------------------------------------------------------------------------------------------------  ------------------------------------------------------------------------------------------------------------------ GFR: Estimated Creatinine Clearance: 36.9 mL/min (A) (by C-G formula based on SCr of 2.07 mg/dL (H)). Liver Function Tests: Recent Labs  Lab 03/19/21 2209  AST 53*  ALT 91*  ALKPHOS 71  BILITOT 0.8  PROT 8.1  ALBUMIN 3.5   Recent Labs  Lab 03/19/21 2209  LIPASE 30   No results for input(s): AMMONIA in the last 168 hours. Coagulation Profile: No results for input(s): INR, PROTIME in the last 168 hours. Cardiac Enzymes: No results for input(s): CKTOTAL, CKMB, CKMBINDEX, TROPONINI in the last 168 hours. BNP (last 3 results) No results for input(s): PROBNP in the last 8760 hours. HbA1C: No results for input(s): HGBA1C in the last 72 hours. CBG: Recent Labs  Lab 03/19/21 2247  GLUCAP 107*   Lipid Profile: No results for input(s): CHOL, HDL, LDLCALC, TRIG, CHOLHDL, LDLDIRECT in the last 72 hours. Thyroid Function Tests: Recent Labs    03/19/21 2224  TSH 5.644*   Anemia Panel: No results for input(s): VITAMINB12, FOLATE, FERRITIN, TIBC, IRON, RETICCTPCT in the last 72 hours.  --------------------------------------------------------------------------------------------------------------- Urine analysis:    Component Value Date/Time  COLORURINE YELLOW (A) 03/20/2021 0151   APPEARANCEUR CLEAR (A) 03/20/2021 0151   LABSPEC <1.005 (L) 03/20/2021 0151   PHURINE 7.0 03/20/2021 0151   GLUCOSEU NEGATIVE 03/20/2021 0151   HGBUR LARGE (A) 03/20/2021 0151   BILIRUBINUR NEGATIVE 03/20/2021 0151   KETONESUR NEGATIVE 03/20/2021 0151   PROTEINUR 30 (A) 03/20/2021 0151   NITRITE NEGATIVE 03/20/2021 0151   LEUKOCYTESUR NEGATIVE 03/20/2021 0151      Imaging Results:    CT HEAD WO CONTRAST ( )  Result Date: 03/20/2021 CLINICAL DATA:   Encephalopathy EXAM: CT HEAD WITHOUT CONTRAST TECHNIQUE: Contiguous axial images were obtained from the base of the skull through the vertex without intravenous contrast. COMPARISON:  None. FINDINGS: Brain: Motion degraded study. 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 Generalized volume loss is mild. No visible hemorrhage or extra-axial collection. Vascular: No abnormal hyperdensity of the major intracranial arteries or dural venous sinuses. No intracranial atherosclerosis. Skull: The visualized skull base, calvarium and extracranial soft tissues are normal. Sinuses/Orbits: Right mastoid effusion. Mild mucosal thickening of the right sphenoid and maxillary sinuses. Small amount of fluid in the right maxillary sinus. The orbits are normal. IMPRESSION: 1. Motion degraded study. No acute intracranial abnormality. Electronically Signed   By: Deatra Robinson M.D.   On: 03/20/2021 00:57   CT Chest W Contrast  Result Date: 03/20/2021 CLINICAL DATA:  Cough, possible aspiration.  Abdominal pain. EXAM: CT CHEST, ABDOMEN, AND PELVIS WITH CONTRAST TECHNIQUE: Multidetector CT imaging of the chest, abdomen and pelvis was performed following the standard protocol during bolus administration of intravenous contrast. CONTRAST:  70mL OMNIPAQUE IOHEXOL 350 MG/ML SOLN COMPARISON:  None. FINDINGS: CT CHEST FINDINGS Cardiovascular: Mild cardiomegaly. Aortic calcifications. No aneurysm. Mediastinum/Nodes: No mediastinal, hilar, or axillary adenopathy. Trachea and esophagus are unremarkable. Thyroid unremarkable. Lungs/Pleura: Mild ground-glass opacities in the right lower lobe. No confluent opacities on the left. No effusions or pneumothorax. Musculoskeletal: No acute bony abnormality. CT ABDOMEN PELVIS FINDINGS Hepatobiliary: No focal hepatic abnormality. Gallbladder unremarkable. Pancreas: No focal abnormality or ductal dilatation. Spleen: No focal abnormality.  Normal size. Adrenals/Urinary Tract: No adrenal abnormality. No  focal renal abnormality. No stones or hydronephrosis. Urinary bladder is unremarkable. Stomach/Bowel: Large stool burden throughout the colon. Diffuse colonic diverticulosis. No active diverticulitis. No bowel obstruction. Gastrostomy tube within the stomach. Vascular/Lymphatic: Aortic atherosclerosis. Reproductive: Mildly prominent prostate. Other: No free fluid or free air. Musculoskeletal: Degenerative changes in the lumbar spine. No acute bony abnormality. IMPRESSION: Cardiomegaly.  Aortic atherosclerosis. Mild ground-glass opacities in the right lower lobe could reflect atelectasis or early infiltrate. No effusions. Colonic diverticulosis. Moderate stool burden throughout the colon. No acute findings in the abdomen or pelvis. Electronically Signed   By: Charlett Nose M.D.   On: 03/20/2021 00:48   CT ABDOMEN PELVIS W CONTRAST  Result Date: 03/20/2021 CLINICAL DATA:  Cough, possible aspiration.  Abdominal pain. EXAM: CT CHEST, ABDOMEN, AND PELVIS WITH CONTRAST TECHNIQUE: Multidetector CT imaging of the chest, abdomen and pelvis was performed following the standard protocol during bolus administration of intravenous contrast. CONTRAST:  70mL OMNIPAQUE IOHEXOL 350 MG/ML SOLN COMPARISON:  None. FINDINGS: CT CHEST FINDINGS Cardiovascular: Mild cardiomegaly. Aortic calcifications. No aneurysm. Mediastinum/Nodes: No mediastinal, hilar, or axillary adenopathy. Trachea and esophagus are unremarkable. Thyroid unremarkable. Lungs/Pleura: Mild ground-glass opacities in the right lower lobe. No confluent opacities on the left. No effusions or pneumothorax. Musculoskeletal: No acute bony abnormality. CT ABDOMEN PELVIS FINDINGS Hepatobiliary: No focal hepatic abnormality. Gallbladder unremarkable. Pancreas: No focal abnormality or ductal dilatation. Spleen: No focal  abnormality.  Normal size. Adrenals/Urinary Tract: No adrenal abnormality. No focal renal abnormality. No stones or hydronephrosis. Urinary bladder is  unremarkable. Stomach/Bowel: Large stool burden throughout the colon. Diffuse colonic diverticulosis. No active diverticulitis. No bowel obstruction. Gastrostomy tube within the stomach. Vascular/Lymphatic: Aortic atherosclerosis. Reproductive: Mildly prominent prostate. Other: No free fluid or free air. Musculoskeletal: Degenerative changes in the lumbar spine. No acute bony abnormality. IMPRESSION: Cardiomegaly.  Aortic atherosclerosis. Mild ground-glass opacities in the right lower lobe could reflect atelectasis or early infiltrate. No effusions. Colonic diverticulosis. Moderate stool burden throughout the colon. No acute findings in the abdomen or pelvis. Electronically Signed   By: Charlett Nose M.D.   On: 03/20/2021 00:48   DG Chest Portable 1 View  Result Date: 03/19/2021 CLINICAL DATA:  Concern for aspiration. Possible altered mental status. Abdominal pain. Lethargy. Cough. EXAM: PORTABLE CHEST 1 VIEW COMPARISON:  None. FINDINGS: Lung volumes are low. The heart is enlarged. Aortic tortuosity. Minor atelectasis at the right lung base. No confluent consolidation. No pleural effusion or pneumothorax. No pulmonary edema. Left humeral head arthroplasty is partially included. IMPRESSION: Low lung volumes with mild atelectasis at the right lung base. Electronically Signed   By: Narda Rutherford M.D.   On: 03/19/2021 22:52      Assessment & Plan:    Active Problems:   Acute metabolic encephalopathy   Aspiration pneumonia (HCC)   Dementia (HCC)   Dysphagia   Aspiration pneumonia No leukocytosis, but patient does have cough and groundglass opacities in the right lower lobe Continue Rocephin and Zithromax Speech consult Dietitian consult for tube feeds Continue to monitor Dementia Apparently able to converse at baseline according to daughter Haldol required in the ED Continue to attempt reorientation Will add Haldol as needed if needed Dysphagia Known dysphagia with a special diet for which  she has not been getting Speech consult Likely because of his aspiration Acute metabolic encephalopathy Baseline is unclear, but it is reported that this is abnormal for him Patient was at first very somnolent, then very agitated On my exam he is not following commands, but does wake up to palpation of the stomach TSH is elevated and T3 and T4 Add ammonia given slight transaminitis Check blood gas-venous Likely to improve with improved provement of infection Continue to monitor    DVT Prophylaxis-   Heparin- SCDs   AM Labs Ordered, also please review Full Orders  Family Communication: I did not personally speak with family but I am told that daughter was here, and when she said DNR paper at bedside she picked it up, Tore it up and left Code Status: Full code  Admission status: Inpatient :The appropriate admission status for this patient is INPATIENT. Inpatient status is judged to be reasonable and necessary in order to provide the required intensity of service to ensure the patient's safety. The patient's presenting symptoms, physical exam findings, and initial radiographic and laboratory data in the context of their chronic comorbidities is felt to place them at high risk for further clinical deterioration. Furthermore, it is not anticipated that the patient will be medically stable for discharge from the hospital within 2 midnights of admission. The following factors support the admission status of inpatient.     The patient's presenting symptoms include cough altered mental status. The worrisome physical exam findings include encephalopathic. The initial radiographic and laboratory data are worrisome because of pneumonia. The chronic co-morbidities include dementia, hypertension.       * I certify that at the point  of admission it is my clinical judgment that the patient will require inpatient hospital care spanning beyond 2 midnights from the point of admission due to high  intensity of service, high risk for further deterioration and high frequency of surveillance required.*  Time spent in minutes : 65   Shyrl Obi B Zierle-Ghosh DO

## 2021-03-20 NOTE — ED Notes (Signed)
Pt getting agitated and aggressive to staff even with daughter in the bedside. RN unable to get full set of VS. Will continue to monitor.

## 2021-03-20 NOTE — Evaluation (Signed)
Clinical/Bedside Swallow Evaluation Patient Details  Name: Jack Rivas MRN: [id number] Date of Birth: March 11, 1946  Today's Date: 03/20/2021 Time: SLP Start Time (ACUTE ONLY): 1450 SLP Stop Time (ACUTE ONLY): 1504 SLP Time Calculation (min) (ACUTE ONLY): 14 min  Past Medical History:  Past Medical History:  Diagnosis Date   Hypertension    Past Surgical History:  Past Surgical History:  Procedure Laterality Date   CATARACT EXTRACTION, BILATERAL     KNEE SURGERY     HPI:  75 year old gentleman admitted with Acute metabolic encephalopathy in a patient with advanced dementia. 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 Pt with history of hypertension, dementia, dysphagia with PEG tube in place. Pt was brought to [facility] by his daughter last month from [place] where he was at an assisted living facility to be here at [facility].  He was admitted with multiple issues including dysphagia and aspiration pneumonia.  Discharge note indicates patient eating mechanical soft with honey thick liquids. Pt reportedly had 2 MBS while admitted. SLP notes not found, but radiology notes report silent aspriation of necter in one trial via straw, otherwise no aspiration.   Assessment / Plan / Recommendation Clinical Impression  Pt demonstrates mentation likely altered futher from baseline dementia. He is awake and responsive to total assisted feeding, but keeps eyes closed and head tilted posteriorally. He accepts teaspoons of puree and sips of honey thick liquids, but multiple swallows and intermittent wet vocal quality noted during incoherent mumbling. Pt recommended to remain NPO with nutrition and meds via PEG until he is more alert and possibly better positioned with head in neutral. Will f/u for further trials. SLP Visit Diagnosis: Dysphagia, oropharyngeal phase (R13.12)    Aspiration Risk  Severe aspiration risk    Diet Recommendation NPO;Alternative means -  long-term   Medication Administration: Via alternative means    Other  Recommendations Oral Care Recommendations: Oral care QID   Follow up Recommendations Skilled Nursing facility      Frequency and Duration min 2x/week  2 weeks       Prognosis Prognosis for Safe Diet Advancement: Fair Barriers to Reach Goals: Cognitive deficits;Severity of deficits      Swallow Study   General HPI: 75 year old gentleman admitted with Acute metabolic encephalopathy in a patient with advanced dementia. 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 Pt with history of hypertension, dementia, dysphagia with PEG tube in place. Pt was brought to [facility] by his daughter last month from [place] where he was at an assisted living facility to be here at [name].  He was admitted with multiple issues including dysphagia and aspiration pneumonia.  Discharge note indicates patient eating mechanical soft with honey thick liquids. Pt reportedly had 2 MBS while admitted. SLP notes not found, but radiology notes report silent aspriation of necter in one trial via straw, otherwise no aspiration. Type of Study: Bedside Swallow Evaluation Diet Prior to this Study: NPO;PEG tube Temperature Spikes Noted: No Respiratory Status: Room air Behavior/Cognition: Lethargic/Drowsy;Requires cueing Oral Cavity Assessment: Within Functional Limits Oral Care Completed by SLP: No Oral Cavity - Dentition: Adequate natural dentition Self-Feeding Abilities: Total assist Patient Positioning: Upright in bed Baseline Vocal Quality: Low vocal intensity;Wet Volitional Cough: Cognitively unable to elicit Volitional Swallow: Unable to elicit    Oral/Motor/Sensory Function Overall Oral Motor/Sensory Function: Other (comment) (unable)   Ice Chips Ice chips: Not tested   Thin Liquid Thin Liquid: Not tested    Nectar  Thick Nectar Thick Liquid: Not tested   Honey Thick Honey Thick Liquid:  Impaired Presentation: Cup Pharyngeal Phase Impairments: Multiple swallows   Puree Puree: Impaired Presentation: Spoon Pharyngeal Phase Impairments: Multiple swallows;Wet Vocal Quality;Throat Clearing - Immediate   Solid     Solid: Not tested     Harlon Ditty, MA CCC-SLP  Office [phone]  Claudine Mouton 03/20/2021,3:08 PM

## 2021-03-20 NOTE — Progress Notes (Signed)
 Brief Nutrition Note  Consult received for enteral/tube feeding initiation and management. Pt has PEG tube  Adult Enteral Nutrition Protocol initiated. Full assessment to follow.  Admitting Dx: Cough [R05.9] Fall, initial encounter [W19.XXXA] Altered mental status, unspecified altered mental status type [R41.82] Acute metabolic encephalopathy [G93.41]   Labs:  Recent Labs  Lab 03/19/21 2209 03/20/21 0251 03/20/21 0709  NA 142  --  142  K 6.2* 5.0 5.2*  CL 109  --  111  CO2 25  --  25  BUN 52*  --  47*  CREATININE 2.07*  --  1.88*  CALCIUM 9.6  --  9.3  GLUCOSE 97  --  66*     Robin Pafford MS, RDN, LDN, CNSC Registered Dietitian III Clinical Nutrition RD Pager and On-Call Pager Number Located in [place]

## 2021-03-20 NOTE — Progress Notes (Addendum)
Patient admitted early morning hours by nighttime hospitalist.  History assessment plan reviewed.  In summary 75 year old gentleman with history of hypertension, dementia, dysphagia with PEG tube in place presented to the emergency room with altered mental status.  Patient is not able to give any history.  Reviewed his previous hospitalization at [facility] and also admission notes.  According to the records, he was brought to [facility] by his daughter last month from [place] where he was at an assisted living facility to be here at [facility].  He was admitted with multiple issues including dysphagia and aspiration pneumonia.  Discharge note indicates patient eating dysphagia diet and even plan for taking out PEG tube.  Brought in to the emergency room with altered mentation.  Acute metabolic encephalopathy in a patient with advanced dementia: Probably multifactorial but likely advancement of dementia. No other acute findings, patient unable to eat.  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  Plan: N.p.o. Start tube feeding, aspiration precautions, head of bed 30 degree elevation In this condition, I do not believe patient can eat Will treat for aspiration pneumonia with Rocephin, azithromycin added for atypical coverage. Will need goal of care discussion, however ER note indicates that family rescinded DNR orders and made him full code.  Will discuss with patient's daughter and if they agree, will consult palliative care for ongoing discussion. Continue supportive care and full CODE STATUS for now. Continue IV fluids.  No charge visit  Called and updated patient's daughter on the phone  Daughter states developed dementia since January. Patient was on ventilator since Jan -March 2022 and in nursing home since then.

## 2021-03-21 ENCOUNTER — Encounter (HOSPITAL_COMMUNITY): Payer: Self-pay | Admitting: Family Medicine

## 2021-03-21 ENCOUNTER — Other Ambulatory Visit: Payer: Self-pay

## 2021-03-21 DIAGNOSIS — E440 Moderate protein-calorie malnutrition: Secondary | ICD-10-CM | POA: Insufficient documentation

## 2021-03-21 LAB — BASIC METABOLIC PANEL
Anion gap: 6 (ref 5–15)
BUN: 41 mg/dL — ABNORMAL HIGH (ref 8–23)
CO2: 25 mmol/L (ref 22–32)
Calcium: 9.1 mg/dL (ref 8.9–10.3)
Chloride: 108 mmol/L (ref 98–111)
Creatinine, Ser: 1.74 mg/dL — ABNORMAL HIGH (ref 0.61–1.24)
GFR, Estimated: 40 mL/min — ABNORMAL LOW (ref >60)
Glucose, Bld: 121 mg/dL — ABNORMAL HIGH (ref 70–99)
Potassium: 4.5 mmol/L (ref 3.5–5.1)
Sodium: 139 mmol/L (ref 135–145)

## 2021-03-21 LAB — CBC WITH DIFFERENTIAL/PLATELET
Abs Immature Granulocytes: 0.01 10*3/uL (ref 0.00–0.07)
Basophils Absolute: 0 10*3/uL (ref 0.0–0.1)
Basophils Relative: 0 %
Eosinophils Absolute: 0.4 10*3/uL (ref 0.0–0.5)
Eosinophils Relative: 9 %
HCT: 26.5 % — ABNORMAL LOW (ref 39.0–52.0)
Hemoglobin: 8.5 g/dL — ABNORMAL LOW (ref 13.0–17.0)
Immature Granulocytes: 0 %
Lymphocytes Relative: 39 %
Lymphs Abs: 1.6 10*3/uL (ref 0.7–4.0)
MCH: 29.3 pg (ref 26.0–34.0)
MCHC: 32.1 g/dL (ref 30.0–36.0)
MCV: 91.4 fL (ref 80.0–100.0)
Monocytes Absolute: 0.3 10*3/uL (ref 0.1–1.0)
Monocytes Relative: 7 %
Neutro Abs: 1.8 10*3/uL (ref 1.7–7.7)
Neutrophils Relative %: 45 %
Platelets: 142 10*3/uL — ABNORMAL LOW (ref 150–400)
RBC: 2.9 MIL/uL — ABNORMAL LOW (ref 4.22–5.81)
RDW: 13.2 % (ref 11.5–15.5)
WBC: 4 10*3/uL (ref 4.0–10.5)
nRBC: 0 % (ref 0.0–0.2)

## 2021-03-21 LAB — GLUCOSE, CAPILLARY
Glucose-Capillary: 113 mg/dL — ABNORMAL HIGH (ref 70–99)
Glucose-Capillary: 126 mg/dL — ABNORMAL HIGH (ref 70–99)
Glucose-Capillary: 71 mg/dL (ref 70–99)
Glucose-Capillary: 96 mg/dL (ref 70–99)
Glucose-Capillary: 98 mg/dL (ref 70–99)
Glucose-Capillary: 99 mg/dL (ref 70–99)

## 2021-03-21 LAB — PHOSPHORUS: Phosphorus: 4.6 mg/dL (ref 2.5–4.6)

## 2021-03-21 LAB — URINE CULTURE

## 2021-03-21 LAB — MAGNESIUM: Magnesium: 1.9 mg/dL (ref 1.7–2.4)

## 2021-03-21 MED ORDER — HALOPERIDOL LACTATE 5 MG/ML IJ SOLN
2.0000 mg | Freq: Four times a day (QID) | INTRAMUSCULAR | Status: DC | PRN
  Administered 2021-03-21 – 2021-03-28 (×10): 2 mg via INTRAVENOUS
  Filled 2021-03-21 (×10): qty 1

## 2021-03-21 MED ORDER — FREE WATER
100.0000 mL | Status: DC
  Administered 2021-03-21 – 2021-03-22 (×5): 100 mL

## 2021-03-21 MED ORDER — PROSOURCE TF PO LIQD
45.0000 mL | Freq: Every day | ORAL | Status: DC
  Administered 2021-03-21 – 2021-03-28 (×8): 45 mL
  Filled 2021-03-21 (×8): qty 45

## 2021-03-21 MED ORDER — HYDROXYZINE HCL 50 MG/ML IM SOLN
25.0000 mg | Freq: Once | INTRAMUSCULAR | Status: AC
  Administered 2021-03-21: 25 mg via INTRAMUSCULAR
  Filled 2021-03-21: qty 0.5

## 2021-03-21 MED ORDER — OSMOLITE 1.5 CAL PO LIQD
1000.0000 mL | ORAL | Status: DC
  Administered 2021-03-21 – 2021-03-28 (×10): 1000 mL
  Filled 2021-03-21 (×13): qty 1000

## 2021-03-21 MED ORDER — LORAZEPAM 2 MG/ML IJ SOLN
INTRAMUSCULAR | Status: AC
  Administered 2021-03-21: 0.5 mg
  Filled 2021-03-21: qty 1

## 2021-03-21 MED ORDER — CHLORHEXIDINE GLUCONATE CLOTH 2 % EX PADS
6.0000 | MEDICATED_PAD | Freq: Every day | CUTANEOUS | Status: DC
  Administered 2021-03-21 – 2021-03-28 (×8): 6 via TOPICAL

## 2021-03-21 MED ORDER — LORAZEPAM 2 MG/ML IJ SOLN: 0.5000 mg | Freq: Once | INTRAMUSCULAR | Status: AC

## 2021-03-21 MED ORDER — QUETIAPINE FUMARATE 50 MG PO TABS
50.0000 mg | ORAL_TABLET | Freq: Every day | ORAL | Status: DC
  Administered 2021-03-21 – 2021-03-27 (×7): 50 mg
  Filled 2021-03-21 (×7): qty 1

## 2021-03-21 MED ORDER — FLEET ENEMA 7-19 GM/118ML RE ENEM
1.0000 | ENEMA | Freq: Once | RECTAL | Status: AC
  Administered 2021-03-21: 1 via RECTAL
  Filled 2021-03-21: qty 1

## 2021-03-21 NOTE — Progress Notes (Signed)
PROGRESS NOTE    Leonte Horrigan  WUJ:[id number] DOB: Dec 07, 1945 DOA: 03/19/2021 PCP: Patient, No Pcp Per (Inactive)    Brief Narrative:  75 year old gentleman with history of hypertension, dementia, dysphagia with PEG tube in place brought to the emergency room with altered mental status.  Patient is mostly nonverbal.  Apparently, he had extensive hospitalization in [place] since January to March with respiratory failure on ventilator status post tracheostomy and PEG tube placement and was at nursing home.  He has chronic dysphagia and aspiration and supposed to be on pured diet.  Recently admitted to [facility] and discharged to nursing home.  Daughter reported that he has been eating pured diet secondary to his previous tracheostomy but there are reports of choking on food. Presented to the ER with agitation, confusion.  Hemodynamically stable.  Creatinine 2.07.  COVID-19 and flu test negative.  CT head with no acute findings.  Assessment & Plan:   Active Problems:   Acute metabolic encephalopathy   Aspiration pneumonia (HCC)   Dementia (HCC)   Dysphagia  Acute metabolic encephalopathy in a patient with advanced dementia:  Probably multifactorial.  Suspect aspiration pneumonia acute on chronic. CT head with chronic findings.  MRI of the brain was limited but did not show any evidence of acute stroke. Chest x-ray with right lower lobe airspace disease, treating as aspiration pneumonia with Rocephin and Flagyl. Very high risk of recurrent aspiration, currently n.p.o.  Will use PEG tube for all nutrition needs and medications. Once his mental status improves, repeat evaluation with speech and may start puree diet.  Essential hypertension: stable. Not on treatment. No need for antihypertensive at home.   Agitation/ anxiety : previously good response to Seroquel. Restarted on seroquel 25 mg daily.  Acute kidney injury with history of chronic kidney disease: Reported baseline  creatinine of 1.8-1.9 according to previous hospitalization.  Treated with IV fluids.  Creatinine 1.74 today.  This is at about his baseline.  We will continue IV fluids today until he has well-established PEG tube intake.  Goal of care: Detail discussion with daughter who is states that patient has good mentation at baseline and is able to eat pured diet. She is looking forward for him to have rehab. Full code. She wants to explore about other SNF's.   DVT prophylaxis: heparin injection 5,000 Units Start: 03/20/21 0630 SCDs Start: 03/20/21 9562   Code Status: Full code Family Communication: Daughter on the phone Disposition Plan: Status is: Inpatient  Remains inpatient appropriate because:IV treatments appropriate due to intensity of illness or inability to take PO and Inpatient level of care appropriate due to severity of illness  Dispo: The patient is from: SNF              Anticipated d/c is to: SNF              Patient currently is not medically stable to d/c.   Difficult to place patient No         Consultants:  None  Procedures:  None  Antimicrobials:  Rocephin and azithromycin 9/3----   Subjective: Patient was seen and examined.  Today he was slightly more awake but unable to keep up any interaction.  No overnight events.  Nursing reported no agitation.  Objective: Vitals:   03/20/21 1354 03/20/21 2020 03/21/21 0451 03/21/21 1124  BP: 136/83 134/72 (!) 147/97 132/77  Pulse: (!) 53 (!) 55 (!) 53 (!) 55  Resp: Temp: (!) 97.4 F (  36.3 C) 97.6 F (36.4 C) 98 F (36.7 C)   TempSrc: Axillary Oral Axillary   SpO2: 100% 96% 100% 99%  Weight:   85.4 kg   Height:        Intake/Output Summary (Last 24 hours) at 03/21/2021 1142 Last data filed at 03/21/2021 0815 Gross per 24 hour  Intake 2279.37 ml  Output 2400 ml  Net -120.63 ml   Filed Weights   03/20/21 0535 03/21/21 0451  Weight: 85.4 kg 85.4 kg    Examination:  General: Chronically  sick looking.  Frail and debilitated.  Not in any distress.  He is on room air. Patient is alert on stimulation but not oriented.  Unable to keep up any conversation.  He speaks 1 or 2 sentences, he was able to say his name and say "all right" Cardiovascular: S1-S2 normal.  Regular rate rhythm. Respiratory: Bilateral clear.  No added sounds. Gastrointestinal: Soft.  PEG tube present.  He has palpable stool in his colon. Ext: No cyanosis.  No edema. Neuro: Alert on stimulation but not oriented.     Data Reviewed: I have personally reviewed following labs and imaging studies  CBC: Recent Labs  Lab 03/19/21 2209 03/20/21 0709 03/21/21 0420  WBC 6.1 5.2 4.0  NEUTROABS  --   --  1.8  HGB 10.0* 9.8* 8.5*  HCT 32.0* 30.3* 26.5*  MCV 93.8 91.3 91.4  PLT 162 150 142*   Basic Metabolic Panel: Recent Labs  Lab 03/19/21 2209 03/20/21 0251 03/20/21 0709 03/21/21 0420  NA 142  --  142 139  K 6.2* 5.0 5.2* 4.5  CL 109  --  111 108  CO2 25  --  25 25  GLUCOSE 97  --  66* 121*  BUN 52*  --  47* 41*  CREATININE 2.07*  --  1.88* 1.74*  CALCIUM 9.6  --  9.3 9.1  MG  --   --   --  1.9  PHOS  --   --   --  4.6   GFR: Estimated Creatinine Clearance: 43.8 mL/min (A) (by C-G formula based on SCr of 1.74 mg/dL (H)). Liver Function Tests: Recent Labs  Lab 03/19/21 2209 03/20/21 0709  AST 53* 43*  ALT 91* 87*  ALKPHOS 71 67  BILITOT 0.8 0.4  PROT 8.1 7.6  ALBUMIN 3.5 3.3*   Recent Labs  Lab 03/19/21 2209  LIPASE 30   Recent Labs  Lab 03/20/21 0709  AMMONIA 17   Coagulation Profile: No results for input(s): INR, PROTIME in the last 168 hours. Cardiac Enzymes: No results for input(s): CKTOTAL, CKMB, CKMBINDEX, TROPONINI in the last 168 hours. BNP (last 3 results) No results for input(s): PROBNP in the last 8760 hours. HbA1C: No results for input(s): HGBA1C in the last 72 hours. CBG: Recent Labs  Lab 03/20/21 2014 03/21/21 0018 03/21/21 0420 03/21/21 0706  03/21/21 1122  GLUCAP 76 98 126* 99 71   Lipid Profile: No results for input(s): CHOL, HDL, LDLCALC, TRIG, CHOLHDL, LDLDIRECT in the last 72 hours. Thyroid Function Tests: Recent Labs    03/19/21 2224 03/20/21 0709  TSH 5.644*  --   FREET4  --  0.85   Anemia Panel: No results for input(s): VITAMINB12, FOLATE, FERRITIN, TIBC, IRON, RETICCTPCT in the last 72 hours. Sepsis Labs: Recent Labs  Lab 03/19/21 2226  LATICACIDVEN 1.1    Recent Results (from the past 240 hour(s))  Resp Panel by RT-PCR (Flu A&B, Covid) Nasopharyngeal Swab     Status:  None   Collection Time: 03/19/21 10:09 PM   Specimen: Nasopharyngeal Swab; Nasopharyngeal(NP) swabs in vial transport medium  Result Value Ref Range Status   SARS Coronavirus 2 by RT PCR NEGATIVE NEGATIVE Final    Comment: (NOTE) SARS-CoV-2 target nucleic acids are NOT DETECTED.  The SARS-CoV-2 RNA is generally detectable in upper respiratory specimens during the acute phase of infection. The lowest concentration of SARS-CoV-2 viral copies this assay can detect is 138 copies/mL. A negative result does not preclude SARS-Cov-2 infection and should not be used as the sole basis for treatment or other patient management decisions. A negative result may occur with  improper specimen collection/handling, submission of specimen other than nasopharyngeal swab, presence of viral mutation(s) within the areas targeted by this assay, and inadequate number of viral copies(<138 copies/mL). A negative result must be combined with clinical observations, patient history, and epidemiological information. The expected result is Negative.  Fact Sheet for Patients:  BloggerCourse.comhttps://www.fda.gov/media/152166/download  Fact Sheet for Healthcare Providers:  SeriousBroker.ithttps://www.fda.gov/media/152162/download  This test is no t yet approved or cleared by the [place] FDA and  has been authorized for detection and/or diagnosis of SARS-CoV-2 by FDA under an Emergency  Use Authorization (EUA). This EUA will remain  in effect (meaning this test can be used) for the duration of the COVID-19 declaration under Section 564(b)(1) of the Act, 21 U.S.C.section 360bbb-3(b)(1), unless the authorization is terminated  or revoked sooner.       Influenza A by PCR NEGATIVE NEGATIVE Final   Influenza B by PCR NEGATIVE NEGATIVE Final    Comment: (NOTE) The Xpert Xpress SARS-CoV-2/FLU/RSV plus assay is intended as an aid in the diagnosis of influenza from Nasopharyngeal swab specimens and should not be used as a sole basis for treatment. Nasal washings and aspirates are unacceptable for Xpert Xpress SARS-CoV-2/FLU/RSV testing.  Fact Sheet for Patients: BloggerCourse.comhttps://www.fda.gov/media/152166/download  Fact Sheet for Healthcare Providers: SeriousBroker.ithttps://www.fda.gov/media/152162/download  This test is not yet approved or cleared by the [place] FDA and has been authorized for detection and/or diagnosis of SARS-CoV-2 by FDA under an Emergency Use Authorization (EUA). This EUA will remain in effect (meaning this test can be used) for the duration of the COVID-19 declaration under Section 564(b)(1) of the Act, 21 U.S.C. section 360bbb-3(b)(1), unless the authorization is terminated or revoked.  Performed at [facility], [address] [place], [place] [address]   Blood culture (routine x 2)     Status: None (Preliminary result)   Collection Time: 03/19/21 10:24 PM   Specimen: BLOOD  Result Value Ref Range Status   Specimen Description   Final    BLOOD LEFT ANTECUBITAL Performed at [facility], [address] [place], [place] [address]    Special Requests   Final    BOTTLES DRAWN AEROBIC AND ANAEROBIC Blood Culture adequate volume Performed at [facility], [address] [place], [place] [address]    Culture   Final    NO GROWTH 1 DAY Performed at [facility], [address] [place], [place]  [address]    Report Status PENDING  Incomplete  Blood culture (routine x 2)     Status: None (Preliminary result)   Collection Time: 03/19/21 10:24 PM   Specimen: BLOOD  Result Value Ref Range Status   Specimen Description   Final    BLOOD BLOOD LEFT HAND Performed at [facility], [address] [place], [place] [address]    Special Requests  Final    BOTTLES DRAWN AEROBIC AND ANAEROBIC Blood Culture results may not be optimal due to an inadequate volume of blood received in culture bottles Performed at [facility], [address] [place], [place] [address]    Culture   Final    NO GROWTH 1 DAY Performed at [facility], [address] [place], [place] [address]    Report Status PENDING  Incomplete  Urine Culture     Status: Abnormal   Collection Time: 03/20/21  1:51 AM   Specimen: Urine, Clean Catch  Result Value Ref Range Status   Specimen Description   Final    URINE, CLEAN CATCH Performed at [facility], [address], [place], [place] [address]    Special Requests   Final    NONE Performed at [facility], [address] [place], [place] [address]    Culture MULTIPLE SPECIES PRESENT, SUGGEST RECOLLECTION (A)  Final   Report Status 03/21/2021 FINAL  Final         Radiology Studies: CT HEAD WO CONTRAST ( )  Result Date: 03/20/2021 CLINICAL DATA:  Encephalopathy EXAM: CT HEAD WITHOUT CONTRAST TECHNIQUE: Contiguous axial images were obtained from the base of the skull through the vertex without intravenous contrast. COMPARISON:  None. FINDINGS: Brain: Motion degraded study. 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 Generalized volume loss is mild. No visible hemorrhage or extra-axial collection. Vascular: No abnormal hyperdensity of the major intracranial arteries or dural venous sinuses. No intracranial atherosclerosis. Skull: The visualized skull base, calvarium and extracranial  soft tissues are normal. Sinuses/Orbits: Right mastoid effusion. Mild mucosal thickening of the right sphenoid and maxillary sinuses. Small amount of fluid in the right maxillary sinus. The orbits are normal. IMPRESSION: 1. Motion degraded study. No acute intracranial abnormality. Electronically Signed   By: Deatra Robinson M.D.   On: 03/20/2021 00:57   CT Chest W Contrast  Result Date: 03/20/2021 CLINICAL DATA:  Cough, possible aspiration.  Abdominal pain. EXAM: CT CHEST, ABDOMEN, AND PELVIS WITH CONTRAST TECHNIQUE: Multidetector CT imaging of the chest, abdomen and pelvis was performed following the standard protocol during bolus administration of intravenous contrast. CONTRAST:  47mL OMNIPAQUE IOHEXOL 350 MG/ML SOLN COMPARISON:  None. FINDINGS: CT CHEST FINDINGS Cardiovascular: Mild cardiomegaly. Aortic calcifications. No aneurysm. Mediastinum/Nodes: No mediastinal, hilar, or axillary adenopathy. Trachea and esophagus are unremarkable. Thyroid unremarkable. Lungs/Pleura: Mild ground-glass opacities in the right lower lobe. No confluent opacities on the left. No effusions or pneumothorax. Musculoskeletal: No acute bony abnormality. CT ABDOMEN PELVIS FINDINGS Hepatobiliary: No focal hepatic abnormality. Gallbladder unremarkable. Pancreas: No focal abnormality or ductal dilatation. Spleen: No focal abnormality.  Normal size. Adrenals/Urinary Tract: No adrenal abnormality. No focal renal abnormality. No stones or hydronephrosis. Urinary bladder is unremarkable. Stomach/Bowel: Large stool burden throughout the colon. Diffuse colonic diverticulosis. No active diverticulitis. No bowel obstruction. Gastrostomy tube within the stomach. Vascular/Lymphatic: Aortic atherosclerosis. Reproductive: Mildly prominent prostate. Other: No free fluid or free air. Musculoskeletal: Degenerative changes in the lumbar spine. No acute bony abnormality. IMPRESSION: Cardiomegaly.  Aortic atherosclerosis. Mild ground-glass opacities in the  right lower lobe could reflect atelectasis or early infiltrate. No effusions. Colonic diverticulosis. Moderate stool burden throughout the colon. No acute findings in the abdomen or pelvis. Electronically Signed   By: Charlett Nose M.D.   On: 03/20/2021 00:48   MR BRAIN WO CONTRAST  Result Date: 03/20/2021 CLINICAL DATA:  Mental status change, persistent or worsening. EXAM: MRI HEAD WITHOUT CONTRAST TECHNIQUE:  Multiplanar, multiecho pulse sequences of the brain and surrounding structures were obtained without intravenous contrast. COMPARISON:  Head CT performed earlier today 03/20/2021. FINDINGS: Brain: The patient was unable to tolerate the full examination. As a result, a coronal diffusion-weighted sequence and a coronal T2 TSE sequence could not be obtained. Multiple acquired sequences are significantly motion degraded, limiting evaluation. Most notably, there is moderate motion degradation of the axial T2 TSE sequence, moderate/severe motion degradation of the axial SWI sequence, severe motion degradation of the axial T2/FLAIR sequence and severe motion degradation of the axial T1 weighted sequence. Mild generalized cerebral atrophy. The diffusion-weighted imaging is of good quality. No evidence of acute infarction Motion degradation significantly limits evaluation for parenchymal signal abnormality. Within the limitations of motion degradation, no intracranial mass, chronic intracranial blood products or extra-axial fluid collection is identified. No midline shift Vascular: Maintained flow voids within the proximal large arterial vessels. Skull and upper cervical spine: No focal suspicious marrow lesion. 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 Sinuses/Orbits: Visualized orbits show no acute finding. Bilateral lens replacements. Chronic medially displaced fracture deformity of the right lamina papyracea. Mild-to-moderate mucosal thickening within the bilateral  maxillary and right sphenoid sinuses. Mild mucosal thickening within the left sphenoid sinus. Other: Large right middle ear/mastoid effusion. Small left mastoid effusion. IMPRESSION: Prematurely terminated, significantly motion degraded and limited examination as described. The diffusion-weighted imaging is of good quality. No evidence of acute infarction. Within the limitations of motion degradation, no acute intracranial abnormality is identified. Mild generalized cerebral atrophy. Paranasal sinus disease, as described. Large right middle ear/mastoid effusion. A small left mastoid effusion is also present. 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 Electronically Signed   By: Jackey Loge D.O.   On: 03/20/2021 17:35   CT ABDOMEN PELVIS W CONTRAST  Result Date: 03/20/2021 CLINICAL DATA:  Cough, possible aspiration.  Abdominal pain. EXAM: CT CHEST, ABDOMEN, AND PELVIS WITH CONTRAST TECHNIQUE: Multidetector CT imaging of the chest, abdomen and pelvis was performed following the standard protocol during bolus administration of intravenous contrast. CONTRAST:  16mL OMNIPAQUE IOHEXOL 350 MG/ML SOLN COMPARISON:  None. FINDINGS: CT CHEST FINDINGS Cardiovascular: Mild cardiomegaly. Aortic calcifications. No aneurysm. Mediastinum/Nodes: No mediastinal, hilar, or axillary adenopathy. Trachea and esophagus are unremarkable. Thyroid unremarkable. Lungs/Pleura: Mild ground-glass opacities in the right lower lobe. No confluent opacities on the left. No effusions or pneumothorax. Musculoskeletal: No acute bony abnormality. CT ABDOMEN PELVIS FINDINGS Hepatobiliary: No focal hepatic abnormality. Gallbladder unremarkable. Pancreas: No focal abnormality or ductal dilatation. Spleen: No focal abnormality.  Normal size. Adrenals/Urinary Tract: No adrenal abnormality. No focal renal abnormality. No stones or hydronephrosis. Urinary bladder is unremarkable. Stomach/Bowel: Large stool burden  throughout the colon. Diffuse colonic diverticulosis. No active diverticulitis. No bowel obstruction. Gastrostomy tube within the stomach. Vascular/Lymphatic: Aortic atherosclerosis. Reproductive: Mildly prominent prostate. Other: No free fluid or free air. Musculoskeletal: Degenerative changes in the lumbar spine. No acute bony abnormality. IMPRESSION: Cardiomegaly.  Aortic atherosclerosis. Mild ground-glass opacities in the right lower lobe could reflect atelectasis or early infiltrate. No effusions. Colonic diverticulosis. Moderate stool burden throughout the colon. No acute findings in the abdomen or pelvis. Electronically Signed   By: Charlett Nose M.D.   On: 03/20/2021 00:48   DG Chest Portable 1 View  Result Date: 03/19/2021 CLINICAL DATA:  Concern for aspiration. Possible altered mental status. Abdominal pain. Lethargy. Cough. EXAM: PORTABLE CHEST 1 VIEW COMPARISON:  None. FINDINGS: Lung volumes are low. The heart is enlarged. Aortic tortuosity. Minor  atelectasis at the right lung base. No confluent consolidation. No pleural effusion or pneumothorax. No pulmonary edema. Left humeral head arthroplasty is partially included. IMPRESSION: Low lung volumes with mild atelectasis at the right lung base. Electronically Signed   By: Narda Rutherford M.D.   On: 03/19/2021 22:52        Scheduled Meds:  chlorhexidine  15 mL Mouth Rinse BID   famotidine  20 mg Per Tube QHS   heparin  5,000 Units Subcutaneous Q8H   mouth rinse  15 mL Mouth Rinse q12n4p   melatonin  6 mg Per Tube QHS   polyethylene glycol  17 g Per Tube BID   QUEtiapine  25 mg Per Tube QHS   Continuous Infusions:  sodium chloride 75 mL/hr at 03/20/21 2313   azithromycin 500 mg (03/21/21 0605)   cefTRIAXone (ROCEPHIN)  IV 1 g (03/21/21 0438)   feeding supplement (OSMOLITE 1.2 CAL) 1,000 mL (03/21/21 0103)     LOS: 1 day    Time spent: 35 minutes    Dorcas Carrow, MD [facility] Pager [phone]

## 2021-03-21 NOTE — Progress Notes (Signed)
  Speech Language Pathology Treatment: Dysphagia  Patient Details Name: Jack Rivas MRN: [id number] DOB: 12-14-1945 Today's Date: 03/21/2021 Time: 8341-9622 SLP Time Calculation (min) (ACUTE ONLY): 20 min  Assessment / Plan / Recommendation Clinical Impression  Patient seen to address dysphagia goals and determine readiness for PO's. Patient was awake and alert when SLP arrived in his room. No family present at bedside. Patient aggreeable to PO intake. He was able to form lips around cup when SLP presented cup sips of honey thick liquids. He did exhibit mulitiple swallows however swallow initiation appeared timely. Cups sips of honey thick liquids were given while alternating bites of puree solids (applesauce). Oral phase of swallow appeared functional with no observed residuals or holding of boulses. Patient was impulsive and would continue to drink if given the opportunity. Voice appeared mildly congested throughout but this did not seem different compared to prior to PO intake. Patient commented to SLP, "so they feed you now?" and did appear to be motivated to eat/drink. As patient with h/o dysphagia, SLP recommending to progress conservatively and continue NPO with primary nutrition and medications via PEG but allow floor stock purees and honey thick liquids when patient is alert.   HPI HPI: 75 year old gentleman admitted with Acute metabolic encephalopathy in a patient with advanced dementia. 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 Pt with history of hypertension, dementia, dysphagia with PEG tube in place. Pt was brought to [facility] by his daughter last month from [place] where he was at an assisted living facility to be here at [facility].  He was admitted with multiple issues including dysphagia and aspiration pneumonia.  Discharge note indicates patient eating mechanical soft with honey thick liquids. Pt reportedly had 2 MBS while admitted. SLP notes not  found, but radiology notes report silent aspriation of necter in one trial via straw, otherwise no aspiration.      SLP Plan  Continue with current plan of care       Recommendations  Diet recommendations: NPO;Other(comment) (floor stock honey thick liquids, puree solids) Liquids provided via: Cup Medication Administration: Via alternative means Supervision: Full supervision/cueing for compensatory strategies;Trained caregiver to feed patient Compensations: Slow rate;Small sips/bites Postural Changes and/or Swallow Maneuvers: Seated upright 90 degrees                Oral Care Recommendations: Oral care QID;Staff/trained caregiver to provide oral care Follow up Recommendations: Skilled Nursing facility SLP Visit Diagnosis: Dysphagia, oropharyngeal phase (R13.12) Plan: Continue with current plan of care       Angela Nevin, MA, CCC-SLP Speech Therapy

## 2021-03-21 NOTE — Progress Notes (Signed)
Pt rectal temp 94.3. MD notified. Orders placed for transfer to stepdown for blanket warmer. Daughter, [name], was called and updated on patients transfer to room 1225.

## 2021-03-21 NOTE — Progress Notes (Signed)
Initial Nutrition Assessment  DOCUMENTATION CODES:   Non-severe (moderate) malnutrition in context of chronic illness  INTERVENTION:  - will adjust TF regimen: Osmolite 1.5 @ 30 ml/hr to advance by 10 ml every 4 hours to reach goal rate of 60 ml/hr with 45 ml Prosource TF once/day and 100 ml free water every 4 hours. - at goal rate, this regimen will provide 2200 kcal (96% kcal need), 101 grams protein (92% protein need), and 1697 ml free water.  - diet advancement as medically feasible.    NUTRITION DIAGNOSIS:   Moderate Malnutrition related to chronic illness (dementia and dysphagia) as evidenced by moderate fat depletion, moderate muscle depletion.  GOAL:   Patient will meet greater than or equal to 90% of their needs  MONITOR:   Diet advancement, TF tolerance, Labs, Weight trends  REASON FOR ASSESSMENT:   Consult Enteral/tube feeding initiation and management  ASSESSMENT:   75 year old male with medical history of HTN, dementia, and dysphagia s/p PEG. He presented to the ED with AMS. Notes indicate that he has chronic dysphagia and aspiration and is to be on a pureed diet.  Patient noted to be a/o to self only. No family or visitors present. Unable to locate documentation in the EMR about TF regimen PTA.  Patient unable to provide any reliable information. He requests a sandwich, ice cream, and soda. Informed him of NPO status and rationale, but unsure if he understood.   SLP worked with patient yesterday and at that time recommended that he remain NPO. SLP re-evaluated patient a short time ago and continues to recommend NPO but that patient can have purees and honey-thick liquids from floor stock.   PEG in place from PTA and he is currently receiving Osmolite 1.2 @ 50 ml/hr which is providing 1440 kcal, 67 grams protein, and 984 ml free water.  Weight yesterday and today documented as 188 lb and no other weight recordings available in the chart.   Labs reviewed; CBGs:  98, 126, 99, 71 mg/dl, BUN: 41 mg/dl, creatinine: 0.53 mg/dl, GFR: 40 ml/min.  Medications reviewed; 20 mg pepcid/day, 6 mg melatonin/night, 17 g miralax BID. IVF; NS @ 75 ml/hr.     NUTRITION - FOCUSED PHYSICAL EXAM:  Flowsheet Row Most Recent Value  Orbital Region Moderate depletion  Upper Arm Region Moderate depletion  Thoracic and Lumbar Region Unable to assess  Buccal Region Moderate depletion  Temple Region Mild depletion  Clavicle Bone Region Moderate depletion  Clavicle and Acromion Bone Region Moderate depletion  Scapular Bone Region Unable to assess  Dorsal Hand Moderate depletion  Patellar Region Moderate depletion  Anterior Thigh Region Moderate depletion  Posterior Calf Region Moderate depletion  Edema (RD Assessment) Mild  [BLE]  Hair Reviewed  Eyes Reviewed  Mouth Unable to assess  Skin Reviewed  Nails Reviewed       Diet Order:   Diet Order             Diet NPO time specified Except for: Other (See Comments)  Diet effective now                   EDUCATION NEEDS:   No education needs have been identified at this time  Skin:  Skin Assessment: Reviewed RN Assessment  Last BM:  PTA/unknown  Height:   Ht Readings from Last 1 Encounters:  03/20/21 6\' 3"  (1.905 m)    Weight:   Wt Readings from Last 1 Encounters:  03/21/21 85.4 kg    Estimated  Nutritional Needs:  Kcal:  2300-2500 kcal Protein:  110-125 grams Fluid:  >/= 2.5 L/day     Trenton Gammon, MS, RD, LDN, CNSC Inpatient Clinical Dietitian RD pager # available in [facility]  After hours/weekend pager # available in [facility]

## 2021-03-21 NOTE — Plan of Care (Signed)
Pt more alert able to state his name and birthdate.   Problem: Clinical Measurements: Goal: Ability to maintain clinical measurements within normal limits will improve Outcome: Progressing Goal: Will remain free from infection Outcome: Progressing Goal: Diagnostic test results will improve Outcome: Progressing  Problem: Respiratory: Goal: Ability to maintain adequate ventilation will improve Outcome: Progressing Goal: Ability to maintain a clear airway will improve Outcome: Progressing   Problem: Skin Integrity: Goal: Risk for impaired skin integrity will decrease Outcome: Progressing  Problem: Nutrition: Goal: Adequate nutrition will be maintained Outcome: Progressing   Problem: Safety: Goal: Ability to remain free from injury will improve Outcome: Progressing
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
## 2021-03-21 NOTE — NC FL2 (Signed)
[place] MEDICAID FL2 LEVEL OF CARE SCREENING TOOL     IDENTIFICATION  Patient Name: Jack Rivas Birthdate: 1946-01-21 Sex: male Admission Date (Current Location): 03/19/2021  [facility] and [place] Number:  Producer, television/film/video and Address:  [facility],  [address] [place], [place] [address]      Provider Number: [id number]  Attending Physician Name and Address:  Dorcas Carrow, MD  Relative Name and Phone Number:  Rocky Crafts Daughter   [phone]    Current Level of Care: Hospital Recommended Level of Care: Skilled Nursing Facility Prior Approval Number:    Date Approved/Denied:   PASRR Number: [id number]  Discharge Plan: SNF    Current Diagnoses: Patient Active Problem List   Diagnosis Date Noted   Acute metabolic encephalopathy 03/20/2021   Aspiration pneumonia (HCC) 03/20/2021   Dementia (HCC) 03/20/2021   Dysphagia 03/20/2021    Orientation RESPIRATION BLADDER Height & Weight     Self  Normal Incontinent Weight: 188 lb 4.8 oz (85.4 kg) Height:  6\' 3"  (190.5 cm)  BEHAVIORAL SYMPTOMS/MOOD NEUROLOGICAL BOWEL NUTRITION STATUS      Incontinent Feeding tube  AMBULATORY STATUS COMMUNICATION OF NEEDS Skin   Limited Assist Verbally Normal                       Personal Care Assistance Level of Assistance  Bathing, Feeding, Dressing Bathing Assistance: Limited assistance Feeding assistance: Maximum assistance Dressing Assistance: Limited assistance     Functional Limitations Info  Sight, Speech, Hearing Sight Info: Adequate Hearing Info: Adequate Speech Info: Adequate    SPECIAL CARE FACTORS FREQUENCY  PT (By licensed PT), OT (By licensed OT)     PT Frequency: Minimum 5x a week OT Frequency: Minimum 5x a week            Contractures Contractures Info: Not present    Additional Factors Info  Code Status, Allergies, Psychotropic (Full Code) Code Status Info: Full Code Allergies Info: NKA Psychotropic Info: QUEtiapine  (SEROQUEL) tablet 25 mg         Current Medications (03/21/2021):  This is the current hospital active medication list Current Facility-Administered Medications  Medication Dose Route Frequency Provider Last Rate Last Admin   0.9 %  sodium chloride infusion   Intravenous Continuous Zierle-Ghosh, Asia B, DO 75 mL/hr at 03/21/21 1335 New Bag at 03/21/21 1335   azithromycin (ZITHROMAX) 500 mg in sodium chloride 0.9 % 250 mL IVPB  500 mg Intravenous Q24H Zierle-Ghosh, Asia B, DO 250 mL/hr at 03/21/21 0605 500 mg at 03/21/21 0605   cefTRIAXone (ROCEPHIN) 1 g in sodium chloride 0.9 % 100 mL IVPB  1 g Intravenous Q24H Zierle-Ghosh, Asia B, DO 200 mL/hr at 03/21/21 0438 1 g at 03/21/21 0438   chlorhexidine (PERIDEX) 0.12 % solution 15 mL  15 mL Mouth Rinse BID Zierle-Ghosh, Asia B, DO   15 mL at 03/21/21 1057   famotidine (PEPCID) tablet 20 mg  20 mg Per Tube QHS 05/21/21, MD   20 mg at 03/20/21 2218   feeding supplement (OSMOLITE 1.5 CAL) liquid 1,000 mL  1,000 mL Per Tube Continuous 2219, MD       feeding supplement (PROSource TF) liquid 45 mL  45 mL Per Tube Daily Ghimire, Dorcas Carrow, MD       free water 100 mL  100 mL Per Tube Q4H Ghimire, Kuber, MD       heparin injection 5,000 Units  5,000 Units Subcutaneous Q8H Zierle-Ghosh, Asia B, DO  5,000 Units at 03/21/21 1309   MEDLINE mouth rinse  15 mL Mouth Rinse q12n4p Zierle-Ghosh, Asia B, DO   15 mL at 03/21/21 1127   melatonin tablet 6 mg  6 mg Per Tube QHS Dorcas Carrow, MD   6 mg at 03/20/21 2230   polyethylene glycol (MIRALAX / GLYCOLAX) packet 17 g  17 g Per Tube BID Dorcas Carrow, MD   17 g at 03/21/21 1251   QUEtiapine (SEROQUEL) tablet 25 mg  25 mg Per Tube QHS Dorcas Carrow, MD   25 mg at 03/20/21 2230     Discharge Medications: Please see discharge summary for a list of discharge medications.  Relevant Imaging Results:  Relevant Lab Results:   Additional Information SSN [id number]  Darleene Cleaver, LCSW

## 2021-03-21 NOTE — TOC Progression Note (Signed)
Transition of Care [facility]) - Progression Note    Patient Details  Name: Jack Rivas MRN: [id number] Date of Birth: 01-30-1946  Transition of Care [facility]) CM/SW Contact  Darleene Cleaver, [place] Phone Number: 03/21/2021, 2:26 PM  Clinical Narrative:     Patient is from [facility].  CSW was informed that patient's daughter might be interested in a different SNF.  CSW attempted to contact daughter to discuss, however unable to leave a message on voice mail.  TOC to follow up tomorrow and discuss disposition plan.        Expected Discharge Plan and Services                                                 Social Determinants of Health (SDOH) Interventions    Readmission Risk Interventions No flowsheet data found.

## 2021-03-22 LAB — BASIC METABOLIC PANEL
Anion gap: 7 (ref 5–15)
BUN: 37 mg/dL — ABNORMAL HIGH (ref 8–23)
CO2: 25 mmol/L (ref 22–32)
Calcium: 8.9 mg/dL (ref 8.9–10.3)
Chloride: 107 mmol/L (ref 98–111)
Creatinine, Ser: 1.66 mg/dL — ABNORMAL HIGH (ref 0.61–1.24)
GFR, Estimated: 43 mL/min — ABNORMAL LOW (ref >60)
Glucose, Bld: 87 mg/dL (ref 70–99)
Potassium: 4.9 mmol/L (ref 3.5–5.1)
Sodium: 139 mmol/L (ref 135–145)

## 2021-03-22 LAB — MRSA NEXT GEN BY PCR, NASAL: MRSA by PCR Next Gen: NOT DETECTED

## 2021-03-22 LAB — GLUCOSE, CAPILLARY
Glucose-Capillary: 62 mg/dL — ABNORMAL LOW (ref 70–99)
Glucose-Capillary: 94 mg/dL (ref 70–99)
Glucose-Capillary: 115 mg/dL — ABNORMAL HIGH (ref 70–99)
Glucose-Capillary: 38 mg/dL — CL (ref 70–99)
Glucose-Capillary: 49 mg/dL — ABNORMAL LOW (ref 70–99)
Glucose-Capillary: 122 mg/dL — ABNORMAL HIGH (ref 70–99)
Glucose-Capillary: 152 mg/dL — ABNORMAL HIGH (ref 70–99)
Glucose-Capillary: 198 mg/dL — ABNORMAL HIGH (ref 70–99)

## 2021-03-22 LAB — LEGIONELLA PNEUMOPHILA SEROGP 1 UR AG: L. pneumophila Serogp 1 Ur Ag: NEGATIVE

## 2021-03-22 LAB — GLUCOSE, RANDOM: Glucose, Bld: 79 mg/dL (ref 70–99)

## 2021-03-22 LAB — CORTISOL-AM, BLOOD: Cortisol - AM: 6.7 ug/dL (ref 6.7–22.6)

## 2021-03-22 LAB — T3, FREE: T3, Free: 2.6 pg/mL (ref 2.0–4.4)

## 2021-03-22 MED ORDER — HYDROCORTISONE NA SUCCINATE PF 100 MG IJ SOLR
100.0000 mg | Freq: Every day | INTRAMUSCULAR | Status: DC
  Administered 2021-03-22: 100 mg via INTRAVENOUS
  Filled 2021-03-22: qty 2

## 2021-03-22 MED ORDER — QUETIAPINE FUMARATE 25 MG PO TABS
25.0000 mg | ORAL_TABLET | Freq: Every morning | ORAL | Status: DC
  Administered 2021-03-22 – 2021-03-28 (×6): 25 mg
  Filled 2021-03-22 (×6): qty 1

## 2021-03-22 MED ORDER — FREE WATER
150.0000 mL | Status: DC
  Administered 2021-03-22 – 2021-03-28 (×36): 150 mL

## 2021-03-22 MED ORDER — DEXTROSE 10 % IV SOLN: INTRAVENOUS | Status: DC

## 2021-03-22 NOTE — TOC Progression Note (Signed)
Transition of Care [facility]) - Progression Note   Patient Details  Name: Jack Rivas MRN: [id number] Date of Birth: 06/21/1946  Transition [facility]) CM/SW Contact  Ewing Schlein, LCSW Phone Number: 03/22/2021, 2:41 PM  Clinical Narrative: Patient has received 2 bed offers: [facility] and [facility]. CSW called daughter and provided bed offers. Daughter to review offers. TOC to follow.  Expected Discharge Plan: Skilled Nursing Facility Barriers to Discharge: Continued Medical Work up  Expected Discharge Plan and Services Expected Discharge Plan: Skilled Nursing Facility In-house Referral: Clinical Social Work Post Acute Care Choice: Skilled Nursing Facility             DME Arranged: N/A DME Agency: NA  Readmission Risk Interventions No flowsheet data found.

## 2021-03-22 NOTE — Progress Notes (Signed)
PROGRESS NOTE    Cliford Sequeira  MGN:[id number] DOB: 1946-01-30 DOA: 03/19/2021 PCP: Patient, No Pcp Per (Inactive)    Brief Narrative:  75 year old gentleman with history of hypertension, dementia, dysphagia with PEG tube in place brought to the emergency room with altered mental status.  Patient is mostly nonverbal.  Apparently, he had extensive hospitalization in [place] since January to March with respiratory failure on ventilator status post tracheostomy and PEG tube placement and was at nursing home.  He has chronic dysphagia and aspiration and supposed to be on pured diet.  Recently admitted to [facility] and discharged to nursing home.  Daughter reported that he has been eating pured diet secondary to his previous tracheostomy but there are reports of choking on food. Presented to the ER with agitation, confusion.  Hemodynamically stable.  Creatinine 2.07.  COVID-19 and flu test negative.  CT head with no acute findings.  Chest x-ray with possible right lower lobe pneumonia and treated as aspiration pneumonia. Patient was found with significant hypoglycemia even on tube feeding for unknown reason.  Also had episode of hypothermia.  Assessment & Plan:   Active Problems:   Acute metabolic encephalopathy   Aspiration pneumonia (HCC)   Dementia (HCC)   Dysphagia   Malnutrition of moderate degree  Acute metabolic encephalopathy in a patient with advanced dementia:  Multifactorial.  Suspect aspiration pneumonia which is acute on chronic. Episode of hypoglycemia for reason unclear?  Possibly causing encephalopathy.  CT head with chronic findings.  MRI of the brain was limited but did not show any evidence of acute stroke. Chest x-ray with right lower lobe airspace disease, treating as aspiration pneumonia with Rocephin and azithromycin. Very high risk of recurrent aspiration, currently n.p.o with nectar thick liquid at bedside.  All-time aspiration precautions.  Will use PEG  tube for all nutrition needs and medications.  Speech therapy will continue to follow.  At this time patient is on nectar thick from the floor stock.  Nutrition to be met by tube feeding.  Hypoglycemia/hypothermia: Normotensive.  Patient noted to be intermittently hypoglycemic even on full dose of Osmolite.  No obvious cause. Low capillary blood sugars confirmed with serum blood sugar. Was he intermittently getting hypoglycemic at nursing home? Cortisol a.m. 6.7 borderline low, will try hydrocortisone. Check sulfonylurea levels, C-peptide and proinsulin levels. 9/6, will start patient on 10% dextrose solution, continue tube feeding and stress dose of steroids. Hypothermia improved with heating blankets, continue monitoring at the stepdown unit.  Essential hypertension: stable. Not on treatment. No need for antihypertensive at home.   Agitation/ anxiety and agitation: Multifactorial.  Hospital-acquired delirium. Seroquel 25 mg in the morning, 50 mg at night.  As needed Haldol. Due to significant agitation and confusion, to protect patient from injury and to protect IV lines and tubes, patient will need soft restraint.  Acute kidney injury with history of chronic kidney disease: Reported baseline creatinine of 1.8-1.9 according to previous hospitalization.  Treated with IV fluids.  Creatinine 1.74 today.  This is at about his baseline.  We will continue IV fluids today until he has well-established PEG tube intake. Increase free water flush to 150 mL every 4 hours along with Osmolite.  Goal of care: Detail discussion with daughter who is states that patient has good mentation at baseline and is able to eat pured diet. She is looking forward for him to have rehab. Full code. She wants to explore about other SNF's. Social worker on board.   DVT prophylaxis: heparin  injection 5,000 Units Start: 03/20/21 0630 SCDs Start: 03/20/21 0998   Code Status: Full code Family Communication:  Daughter on the phone.  Called and updated. Disposition Plan: Status is: Inpatient  Remains inpatient appropriate because:IV treatments appropriate due to intensity of illness or inability to take PO and Inpatient level of care appropriate due to severity of illness  Dispo: The patient is from: SNF              Anticipated d/c is to: SNF              Patient currently is not medically stable to d/c.   Difficult to place patient No   Significant hypoglycemia , hypothermia, stay at step down unit.    Consultants:  None  Procedures:  None  Antimicrobials:  Rocephin and azithromycin 9/3----   Subjective: Patient seen and examined.  He was alert and awake but not oriented.  Able to follow simple commands.  He was with some paranoid ideas, was talking about some home issues.  His voice was clear but disoriented.  He does not know he is in the hospital. Significant event: Noted his blood sugars 38 on Accu-Cheks, already given some dextrose by mouth, serum blood sugar 72 which correlates with earlier blood sugars.  Temperature improved with Bair hugger.  Objective: Vitals:   03/22/21 0541 03/22/21 0600 03/22/21 0700 03/22/21 0800  BP:  124/61 (!) 148/84 (!) 145/77  Pulse:  77 83 (!) 48  Resp:  $Remo'15 14 11  'DdbaC$ Temp:    97.8 F (36.6 C)  TempSrc:    Oral  SpO2:  100% 100% 100%  Weight: 87.6 kg     Height:        Intake/Output Summary (Last 24 hours) at 03/22/2021 1032 Last data filed at 03/22/2021 0800 Gross per 24 hour  Intake 3268.32 ml  Output 2875 ml  Net 393.32 ml   Filed Weights   03/20/21 0535 03/21/21 0451 03/22/21 0541  Weight: 85.4 kg 85.4 kg 87.6 kg    Examination:  General: Chronically sick looking.  Frail and debilitated.  Not in any distress.  He is on room air. Alert and awake.  Disoriented.  Unable to keep up any conversation.  Easily distractible.  Speaks in few sentences with clear voice. Cardiovascular: S1-S2 normal.  Regular rate rhythm. Respiratory:  Bilateral clear.  No added sounds. Gastrointestinal: Soft.  PEG tube present.  Tube feeding running. Ext: No cyanosis.  No edema. Neuro: Alert on stimulation but not oriented.     Data Reviewed: I have personally reviewed following labs and imaging studies  CBC: Recent Labs  Lab 03/19/21 2209 03/20/21 0709 03/21/21 0420  WBC 6.1 5.2 4.0  NEUTROABS  --   --  1.8  HGB 10.0* 9.8* 8.5*  HCT 32.0* 30.3* 26.5*  MCV 93.8 91.3 91.4  PLT 162 150 338*   Basic Metabolic Panel: Recent Labs  Lab 03/19/21 2209 03/20/21 0251 03/20/21 0709 03/21/21 0420 03/22/21 0236 03/22/21 0933  NA 142  --  142 139 139  --   K 6.2* 5.0 5.2* 4.5 4.9  --   CL 109  --  111 108 107  --   CO2 25  --  $R'25 25 25  'ix$ --   GLUCOSE 97  --  66* 121* 87 79  BUN 52*  --  47* 41* 37*  --   CREATININE 2.07*  --  1.88* 1.74* 1.66*  --   CALCIUM 9.6  --  9.3 9.1  8.9  --   MG  --   --   --  1.9  --   --   PHOS  --   --   --  4.6  --   --    GFR: Estimated Creatinine Clearance: 46 mL/min (A) (by C-G formula based on SCr of 1.66 mg/dL (H)). Liver Function Tests: Recent Labs  Lab 03/19/21 2209 03/20/21 0709  AST 53* 43*  ALT 91* 87*  ALKPHOS 71 67  BILITOT 0.8 0.4  PROT 8.1 7.6  ALBUMIN 3.5 3.3*   Recent Labs  Lab 03/19/21 2209  LIPASE 30   Recent Labs  Lab 03/20/21 0709  AMMONIA 17   Coagulation Profile: No results for input(s): INR, PROTIME in the last 168 hours. Cardiac Enzymes: No results for input(s): CKTOTAL, CKMB, CKMBINDEX, TROPONINI in the last 168 hours. BNP (last 3 results) No results for input(s): PROBNP in the last 8760 hours. HbA1C: No results for input(s): HGBA1C in the last 72 hours. CBG: Recent Labs  Lab 03/22/21 0506 03/22/21 0612 03/22/21 0835 03/22/21 0836 03/22/21 0907  GLUCAP 62* 115* 38* 38* 49*   Lipid Profile: No results for input(s): CHOL, HDL, LDLCALC, TRIG, CHOLHDL, LDLDIRECT in the last 72 hours. Thyroid Function Tests: Recent Labs    03/19/21 2224  03/20/21 0709  TSH 5.644*  --   FREET4  --  0.85  T3FREE  --  2.6   Anemia Panel: No results for input(s): VITAMINB12, FOLATE, FERRITIN, TIBC, IRON, RETICCTPCT in the last 72 hours. Sepsis Labs: Recent Labs  Lab 03/19/21 2226  LATICACIDVEN 1.1    Recent Results (from the past 240 hour(s))  Resp Panel by RT-PCR (Flu A&B, Covid) Nasopharyngeal Swab     Status: None   Collection Time: 03/19/21 10:09 PM   Specimen: Nasopharyngeal Swab; Nasopharyngeal(NP) swabs in vial transport medium  Result Value Ref Range Status   SARS Coronavirus 2 by RT PCR NEGATIVE NEGATIVE Final    Comment: (NOTE) SARS-CoV-2 target nucleic acids are NOT DETECTED.  The SARS-CoV-2 RNA is generally detectable in upper respiratory specimens during the acute phase of infection. The lowest concentration of SARS-CoV-2 viral copies this assay can detect is 138 copies/mL. A negative result does not preclude SARS-Cov-2 infection and should not be used as the sole basis for treatment or other patient management decisions. A negative result may occur with  improper specimen collection/handling, submission of specimen other than nasopharyngeal swab, presence of viral mutation(s) within the areas targeted by this assay, and inadequate number of viral copies(<138 copies/mL). A negative result must be combined with clinical observations, patient history, and epidemiological information. The expected result is Negative.  Fact Sheet for Patients:  EntrepreneurPulse.com.au  Fact Sheet for Healthcare Providers:  IncredibleEmployment.be  This test is no t yet approved or cleared by the [place] FDA and  has been authorized for detection and/or diagnosis of SARS-CoV-2 by FDA under an Emergency Use Authorization (EUA). This EUA will remain  in effect (meaning this test can be used) for the duration of the COVID-19 declaration under Section 564(b)(1) of the Act, 21 U.S.C.section  360bbb-3(b)(1), unless the authorization is terminated  or revoked sooner.       Influenza A by PCR NEGATIVE NEGATIVE Final   Influenza B by PCR NEGATIVE NEGATIVE Final    Comment: (NOTE) The Xpert Xpress SARS-CoV-2/FLU/RSV plus assay is intended as an aid in the diagnosis of influenza from Nasopharyngeal swab specimens and should not be used as a sole basis for  treatment. Nasal washings and aspirates are unacceptable for Xpert Xpress SARS-CoV-2/FLU/RSV testing.  Fact Sheet for Patients: EntrepreneurPulse.com.au  Fact Sheet for Healthcare Providers: IncredibleEmployment.be  This test is not yet approved or cleared by the [place] FDA and has been authorized for detection and/or diagnosis of SARS-CoV-2 by FDA under an Emergency Use Authorization (EUA). This EUA will remain in effect (meaning this test can be used) for the duration of the COVID-19 declaration under Section 564(b)(1) of the Act, 21 U.S.C. section 360bbb-3(b)(1), unless the authorization is terminated or revoked.  Performed at [facility], [place] [address] [place], [place] [address]   Blood culture (routine x 2)     Status: None (Preliminary result)   Collection Time: 03/19/21 10:24 PM   Specimen: BLOOD  Result Value Ref Range Status   Specimen Description   Final    BLOOD LEFT ANTECUBITAL Performed at [facility] [address], [place], [place] [address]    Special Requests   Final    BOTTLES DRAWN AEROBIC AND ANAEROBIC Blood Culture adequate volume Performed at [facility] [address] [place], [place] [address]    Culture   Final    NO GROWTH 2 DAYS Performed at [facility] [address] [place], [place] [address]    Report Status PENDING  Incomplete  Blood culture (routine x 2)     Status: None (Preliminary result)   Collection Time: 03/19/21 10:24 PM   Specimen: BLOOD  Result Value Ref  Range Status   Specimen Description   Final    BLOOD BLOOD LEFT HAND Performed at [facility] [address] [place], [place] [address]    Special Requests   Final    BOTTLES DRAWN AEROBIC AND ANAEROBIC Blood Culture results may not be optimal due to an inadequate volume of blood received in culture bottles Performed at [facility] [address], [place], [place] [address]    Culture   Final    NO GROWTH 2 DAYS Performed at [facility] [address], [place], [place] [address]    Report Status PENDING  Incomplete  Urine Culture     Status: Abnormal   Collection Time: 03/20/21  1:51 AM   Specimen: Urine, Clean Catch  Result Value Ref Range Status   Specimen Description   Final    URINE, CLEAN CATCH Performed at [facility], [place] [address], [place], [place] [address]    Special Requests   Final    NONE Performed at [facility], [place] [address] [place], [place] [address]    Culture MULTIPLE SPECIES PRESENT, SUGGEST RECOLLECTION (A)  Final   Report Status 03/21/2021 FINAL  Final  MRSA Next Gen by PCR, Nasal     Status: None   Collection Time: 03/22/21  8:12 AM   Specimen: Nasal Mucosa; Nasal Swab  Result Value Ref Range Status   MRSA by PCR Next Gen NOT DETECTED NOT DETECTED Final    Comment: (NOTE) The GeneXpert MRSA Assay (FDA approved for NASAL specimens only), is one component of a comprehensive MRSA colonization surveillance program. It is not intended to diagnose MRSA infection nor to guide or monitor treatment for MRSA infections. Test performance is not FDA approved in patients less than 21 years old. Performed at [facility], [place] [address] [place], [place] [address]          Radiology Studies: MR  BRAIN WO CONTRAST  Result Date: 03/20/2021 CLINICAL DATA:  Mental status change, persistent or worsening. EXAM: MRI HEAD WITHOUT CONTRAST TECHNIQUE:  Multiplanar, multiecho pulse sequences of the brain and surrounding structures were obtained without intravenous contrast. COMPARISON:  Head CT performed earlier today 03/20/2021. FINDINGS: Brain: The patient was unable to tolerate the full examination. As a result, a coronal diffusion-weighted sequence and a coronal T2 TSE sequence could not be obtained. Multiple acquired sequences are significantly motion degraded, limiting evaluation. Most notably, there is moderate motion degradation of the axial T2 TSE sequence, moderate/severe motion degradation of the axial SWI sequence, severe motion degradation of the axial T2/FLAIR sequence and severe motion degradation of the axial T1 weighted sequence. Mild generalized cerebral atrophy. The diffusion-weighted imaging is of good quality. No evidence of acute infarction Motion degradation significantly limits evaluation for parenchymal signal abnormality. Within the limitations of motion degradation, no intracranial mass, chronic intracranial blood products or extra-axial fluid collection is identified. No midline shift Vascular: Maintained flow voids within the proximal large arterial vessels. Skull and upper cervical spine: No focal suspicious marrow lesion. 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 Sinuses/Orbits: Visualized orbits show no acute finding. Bilateral lens replacements. Chronic medially displaced fracture deformity of the right lamina papyracea. Mild-to-moderate mucosal thickening within the bilateral maxillary and right sphenoid sinuses. Mild mucosal thickening within the left sphenoid sinus. Other: Large right middle ear/mastoid effusion. Small left mastoid effusion. IMPRESSION: Prematurely terminated, significantly motion degraded and limited examination as described. The diffusion-weighted imaging is of good quality. No evidence of acute infarction. Within the limitations of motion degradation, no acute  intracranial abnormality is identified. Mild generalized cerebral atrophy. Paranasal sinus disease, as described. Large right middle ear/mastoid effusion. A small left mastoid effusion is also present. 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 Electronically Signed   By: Kellie Simmering D.O.   On: 03/20/2021 17:35        Scheduled Meds:  chlorhexidine  15 mL Mouth Rinse BID   Chlorhexidine Gluconate Cloth  6 each Topical Daily   famotidine  20 mg Per Tube QHS   feeding supplement (PROSource TF)  45 mL Per Tube Daily   free water  150 mL Per Tube Q4H   heparin  5,000 Units Subcutaneous Q8H   hydrocortisone sod succinate (SOLU-CORTEF) inj  100 mg Intravenous Daily   mouth rinse  15 mL Mouth Rinse q12n4p   melatonin  6 mg Per Tube QHS   polyethylene glycol  17 g Per Tube BID   QUEtiapine  25 mg Per Tube q morning   QUEtiapine  50 mg Per Tube QHS   Continuous Infusions:  azithromycin Stopped (03/22/21 0640)   cefTRIAXone (ROCEPHIN)  IV Stopped (03/22/21 0536)   dextrose 75 mL/hr at 03/22/21 1002   feeding supplement (OSMOLITE 1.5 CAL) 1,000 mL (03/22/21 0816)     LOS: 2 days    Time spent: 35 minutes    Barb Merino, MD [facility] Pager [phone]

## 2021-03-22 NOTE — Progress Notes (Signed)
Speech Language Pathology Treatment: Dysphagia  Patient Details Name: Jack Rivas MRN: [id number] DOB: [birth year] Today's Date: 03/22/2021 Time: 2202-5427 (+ 15 minutes on phone with daughter) SLP Time Calculation (min) (ACUTE ONLY): 20 min  Assessment / Plan / Recommendation Clinical Impression  Patient seen to determine readiness for full po diet and to discuss pt's hx with daughter Jack Rivas* via phone.  Reveiwed pt's medical history including pt requiring vent use for 3 months - off vent April 2022 - and having had a trach for one month due to secretion aspiration.    Daughter reports pt was on a soft/nectar diet at [facility] after having undergone MBS.  SLP Reviewed [facility] (radiology note) that showed pt had aspiration of thin and nectar via straw - pooling of puree and no aspiration/penetration of solids - but SLP could not locate SLP report.    Daughter confirms pt's voice is "garbled" at baseline - causing SLP to suspect some baseline chronic secretion aspiration.   Jack Rivas further states that the SNF wanted pt to get his PEG tube removed, stating he was on a regular diet. SLP advised daughter speak to MD regarding this issue - but advised that pt is using his PEG tube with this acute illness due to concerns for aspiration.   Per notes from [place], PEG left in place upon DC due to pt's repeated hypoglycemia episodes.    [facility] MD also documented that family was feeding pt with accepted aspiration risk.  Informed daughter to chronicity of aspiration risk with pt's dementia, h/o aspiration, etc and mitigation strategies are implemented.  Further reviewed other risk factors for aspiration pneumonias - not just dysphagia - to help [name] view pt's entire medical condition.    SLP then saw pt to address dysphagia goals. Pt was fully alert and said "I'm hungry".  After repositioning and freeing pt from restraints to allow self feeding, provided pt with nectar thick soda and applesauce - Pt  consumed 7.5 ounces of nectar soda and 4 ounces of applesauce with SLP provided total cues to slow rate and swallow before taking more.  He was observed to become "gurgly" toward end of session and did not follow directions to cough or clear his throat.  Pt's rehab potential for acttive dysphagia treatement will be limited by his decreased ability to follow directions at this time. Mitigation strategies with full supervision is appropriate at this time.   Dependent on mentation, baseline cognition, active SLP swallow rehab may be indicated at next venue of care - as daughter reports pt's cognition is worse currently.    Recommend to continue floor stock items - nectar via cup - and purees for now with total assist for airway protection.  Will follow up for readiness to transition into full po diet and/or instrumental evaluation indication and ongoing family education.  Per review of [facility] notes, pt was at [facility] 02/21/21-03/15/2021 before admitted here on 03/20/2021.  SLP would recommend due to pt's complex medical hx and lengthy [facility] admit, rec consideration for palliative for GOC.    HPI HPI: 75 year old gentleman admitted with Acute metabolic encephalopathy in a patient with advanced dementia. 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 Pt with history of hypertension, dementia, dysphagia with PEG tube in place. Pt was brought to [facility] by his daughter last month from [place] where he was at an assisted living facility to be here at [facility].  He was admitted with multiple issues including dysphagia and aspiration  pneumonia.  Discharge note indicates patient eating mechanical soft with honey thick liquids. Pt reportedly had 2 MBS while admitted. SLP notes not found, but radiology notes report silent aspriation of necter in one trial via straw, otherwise no aspiration.      SLP Plan  Continue with current plan of care       Recommendations   Diet recommendations: Nectar-thick liquid;Dysphagia 1 (puree) Liquids provided via: Cup;No straw Medication Administration: Via alternative means Supervision: Full supervision/cueing for compensatory strategies;Trained caregiver to feed patient Compensations: Slow rate;Small sips/bites Postural Changes and/or Swallow Maneuvers: Seated upright 90 degrees (cue to cough if voice gurgly)                Oral Care Recommendations: Oral care QID;Staff/trained caregiver to provide oral care Follow up Recommendations: Skilled Nursing facility SLP Visit Diagnosis: Dysphagia, oropharyngeal phase (R13.12) Plan: Continue with current plan of care       GO                Chales Abrahams 03/22/2021, 12:00 PM

## 2021-03-22 NOTE — Progress Notes (Signed)
Occupational Therapy Evaluation  Patient with history of dementia and superimposed AMS therefore unable to provide any PLOF information and family not present. Patient very tangential and often making statements that do not make sense. Does not appear to like having people behind him or adjusting under gown (doing so to ensure safety with PEG tube), becomes more combative and does swing at PT. Patient not following any 1 step directions throughout session. Attempted sit to stand with OT/ PT supporting under each arm and RN in front to hold arms/hands however patient with strong posterior lean and even scissors left leg over right therefore stand back onto bed. Unable to fully extend at hips with attempt, unsure if related to cognition/not understanding task of trying to stand vs balance. Will trial acute OT services to minimize caregiver burden and recommend return to SNF at D/C as patient currently needing 24/7 assistance for all self care tasks.     03/22/21 1301  OT Visit Information  Last OT Received On 03/22/21  Assistance Needed +2  PT/OT/SLP Co-Evaluation/Treatment Yes  Reason for Co-Treatment Complexity of the patient's impairments (multi-system involvement);Necessary to address cognition/behavior during functional activity;For patient/therapist safety;To address functional/ADL transfers  PT goals addressed during session Mobility/safety with mobility  OT goals addressed during session ADL's and self-care  History of Present Illness 75 year old gentleman brought to the emergency room with altered mental status, recently DC'd from [facility] to SNF. Apparently, he had extensive hospitalization in [place] since January to March with respiratory failure on ventilator status post tracheostomy and PEG tube placement. Admitted with Acute metabolic encephalopathy in a patient with advanced dementia: Probably multifactorial.  Suspect aspiration pneumonia acute on chronic.  CT head with  chronic findings.  MRI of the brain was limited but did not show any evidence of acute stroke.  Chest x-ray with right lower lobe airspace disease, treating as aspiration pneumonia. PMH includes hypertension, dementia, dysphagia with PEG tube in place  Precautions  Precautions Fall  Precaution Comments gets agitated and combative, PEG  Restrictions  Weight Bearing Restrictions No  Home Living  Family/patient expects to be discharged to: Skilled nursing facility  Prior Function  Comments unsure of patient's PLOF as he has had extensive hospitalizations since apparently Janurary per chart review  Communication  Communication Expressive difficulties  Pain Assessment  Pain Assessment Faces  Faces Pain Scale 0  Cognition  Arousal/Alertness Awake/alert  Behavior During Therapy Agitated;Restless  Overall Cognitive Status No family/caregiver present to determine baseline cognitive functioning  General Comments not oriented, never spoke his name when asked, Did not follow any directions. More agitated when touched. Released wrist restraints briefly and replaced at end. intermittently takes a swing at therapist  Upper Extremity Assessment  Upper Extremity Assessment Difficult to assess due to impaired cognition  Lower Extremity Assessment  Lower Extremity Assessment  (appears functional strength given resistance when trying to look at arm bands and attempting to stand)  ADL  Overall ADL's  Needs assistance/impaired  Eating/Feeding Total assistance  Grooming Total assistance  Upper Body Bathing Total assistance  Lower Body Bathing Total assistance  Upper Body Dressing  Total assistance  Lower Body Dressing Total assistance  Toilet Transfer Maximal assistance (+3)  Toilet Transfer Details (indicate cue type and reason) unable to come to complete stand at edge of bed patient strong posterior lean, unsure if this is balace related, cognition/not following cues, two assist under arms and RN  present to hold patient hands  Toileting- Clothing Manipulation and Hygiene Total assistance;Bed  level  General ADL Comments due to confusion, unable to follow any 1 step directions patient is total assist for all self care tasks at this time  Bed Mobility  Overal bed mobility Needs Assistance  Bed Mobility Supine to Sit;Sit to Supine  Supine to sit Mod assist  Sit to supine Max assist;+2 for physical assistance;+2 for safety/equipment  General bed mobility comments extra time, multimodal cues but does not follow directions, Legs already over bed edge, patient  rocked to attempt to get closer to bed edge. 2 max to return to supine , patient more agitated  Transfers  Overall transfer level Needs assistance  Equipment used  (3 person hand hold)  Transfers Sit to/from Stand  General transfer comment attempted to stand with patient, 2 under arms and 1 holding both hands to attempt to  pull up to stand, buttock clear bed however unable to come to complete stand  Balance  Overall balance assessment Needs assistance  Sitting-balance support No upper extremity supported;Feet unsupported  Sitting balance-Leahy Scale Fair  Sitting balance - Comments sits on bed edge and maintains balance  OT - End of Session  Activity Tolerance Treatment limited secondary to agitation  Patient left in bed;with call bell/phone within reach;with nursing/sitter in room;with restraints reapplied  Nurse Communication Other (comment) (RN present for majority of session)  OT Assessment  OT Recommendation/Assessment Patient needs continued OT Services  OT Visit Diagnosis Other abnormalities of gait and mobility (R26.89);Other symptoms and signs involving cognitive function  OT Problem List Impaired balance (sitting and/or standing);Decreased cognition;Decreased safety awareness;Decreased activity tolerance  OT Plan  OT Frequency (ACUTE ONLY) Min 2X/week  OT Treatment/Interventions (ACUTE ONLY) Self-care/ADL  training;Therapeutic activities;Cognitive remediation/compensation;Patient/family education;Balance training  AM-PAC OT "6 Clicks" Daily Activity Outcome Measure (Version 2)  Help from another person eating meals? 1  Help from another person taking care of personal grooming? 1  Help from another person toileting, which includes using toliet, bedpan, or urinal? 1  Help from another person bathing (including washing, rinsing, drying)? 1  Help from another person to put on and taking off regular upper body clothing? 1  Help from another person to put on and taking off regular lower body clothing? 1  6 Click Score 6  Progressive Mobility  What is the highest level of mobility based on the progressive mobility assessment? Level 2 (Chairfast) - Balance while sitting on edge of bed and cannot stand  Mobility Sit up in bed/chair position for meals  OT Recommendation  Follow Up Recommendations SNF;Supervision/Assistance - 24 hour  OT Equipment None recommended by OT  Individuals Consulted  Consulted and Agree with Results and Recommendations Patient unable/family or caregiver not available  Acute Rehab OT Goals  Patient Stated Goal "where is my truck"  OT Goal Formulation With patient  Time For Goal Achievement 04/05/21  Potential to Achieve Goals Fair  OT Time Calculation  OT Start Time (ACUTE ONLY) 1135  OT Stop Time (ACUTE ONLY) 1150  OT Time Calculation (min) 15 min  OT General Charges  $OT Visit 1 Visit  OT Evaluation  $OT Eval Low Complexity 1 Low  Written Expression  Dominant Hand  (unable to specify)   Marlyce Huge OT OT pager: [phone]

## 2021-03-22 NOTE — Plan of Care (Signed)
  Problem: Education: Goal: Knowledge of General Education information will improve Description: Including pain rating scale, medication(s)/side effects and non-pharmacologic comfort measures Outcome: Not Progressing   Problem: Health Behavior/Discharge Planning: Goal: Ability to manage health-related needs will improve Outcome: Not Progressing   Problem: Activity: Goal: Risk for activity intolerance will decrease Outcome: Not Progressing   Problem: Nutrition: Goal: Adequate nutrition will be maintained Outcome: Progressing   Problem: Coping: Goal: Level of anxiety will decrease Outcome: Not Progressing

## 2021-03-22 NOTE — Evaluation (Signed)
Physical Therapy Evaluation Patient Details Name: Jack Rivas MRN: [id number] DOB: [birth year] Today's Date: 03/22/2021   History of Present Illness  75 year old gentleman with history of hypertension, dementia, dysphagia with PEG tube in place brought to the emergency room with altered mental status, recently DC'd from [facility] to SNF.Apparently, he had extensive hospitalization in [place] since January to March with respiratory failure on ventilator status post tracheostomy and PEG tube placement. Admitted with Acute metabolic encephalopathy in a patient with advanced dementia:   Suspect aspiration pneumonia- acute on chronic.  CT head with chronic findings.  MRI of the brain was limited but did not show any evidence of acute stroke.  Chest x-ray with right lower lobe airspace disease, treating as aspiration pneumonia  Clinical Impression  The  patient is restless in bed, legs hanging over edge. Patient did not follow any directions,  Not oriented.Speech mumbled mostly , at times able to understand :" I want my car." Verbally aggressive and swinging at therapist.  Patient's wrist restraints loosened in order to assess bed mobility. Patient sat upright and rocked self to scoot to bed edge. With 3 persons, attempted to stand . Patient  did not rise and bear weight. More agitated when being touched. Assisted back into bed and restraints applied. RN in room.  Patient comes from SNF, prior level of function unknown at this time. Recommend return to SNF.   Will provide trial of PT if patient is able to participate and not be aggressive .  Follow Up Recommendations SNF    Equipment Recommendations  None recommended by PT    Recommendations for Other Services       Precautions / Restrictions Precautions Precautions: Fall Precaution Comments: gets agitated and combative, PEG      Mobility  Bed Mobility Overal bed mobility: Needs Assistance Bed Mobility: Supine to Sit;Sit to Supine      Supine to sit: Mod assist Sit to supine: Max assist   General bed mobility comments: extra time, multimodal cues but does not follow directions, Legs already over bed edge, patient  rocked to attempt to get closer to bed edge. 2 max to return to supine , patient more agitated    Transfers Overall transfer level: Needs assistance Equipment used:  (3 person  handhold, body support) Transfers: Sit to/from Stand           General transfer comment: attempted to stand with patient, 2 under arms and 1 holding both hands to attempt to  pull up to stand., patient did not stand  Ambulation/Gait                Stairs            Wheelchair Mobility    Modified Rankin (Stroke Patients Only)       Balance Overall balance assessment: Needs assistance Sitting-balance support: No upper extremity supported;Feet unsupported Sitting balance-Leahy Scale: Fair Sitting balance - Comments: sits on bed edge and maintains balance                                     Pertinent Vitals/Pain Pain Assessment: Faces Faces Pain Scale: No hurt    Home Living Family/patient expects to be discharged to:: Skilled nursing facility                      Prior Function  Comments: unsure of functional level     Hand Dominance        Extremity/Trunk Assessment        Lower Extremity Assessment Lower Extremity Assessment: Difficult to assess due to impaired cognition (patient did have both legs over bed edge, sis not bear weight with attempt to stand with 3 persons)    Cervical / Trunk Assessment Cervical / Trunk Assessment: Normal;Other exceptions Cervical / Trunk Exceptions: sitting upright in bed not leaning against bed, restless, fidgety  Communication   Communication: Expressive difficulties  Cognition Arousal/Alertness: Awake/alert Behavior During Therapy: Agitated;Restless Overall Cognitive Status: No family/caregiver present to  determine baseline cognitive functioning                                 General Comments: not oriented, never spoke his name when asked, Did not follow any directions. More agitated when touched. Released mits briefly and replaced at end. intermittently takes a swing at therapist      General Comments      Exercises     Assessment/Plan    PT Assessment Patient needs continued PT services  PT Problem List Decreased mobility;Decreased activity tolerance;Decreased cognition;Decreased knowledge of use of DME       PT Treatment Interventions Therapeutic activities;Cognitive remediation;Therapeutic exercise;Patient/family education;Functional mobility training    PT Goals (Current goals can be found in the Care Plan section)  Acute Rehab PT Goals PT Goal Formulation: Patient unable to participate in goal setting Time For Goal Achievement: 04/05/21 Potential to Achieve Goals: Fair    Frequency Min 2X/week   Barriers to discharge        Co-evaluation PT/OT/SLP Co-Evaluation/Treatment: Yes Reason for Co-Treatment: Necessary to address cognition/behavior during functional activity;For patient/therapist safety PT goals addressed during session: Mobility/safety with mobility OT goals addressed during session: ADL's and self-care       AM-PAC PT "6 Clicks" Mobility  Outcome Measure Help needed turning from your back to your side while in a flat bed without using bedrails?: Total Help needed moving from lying on your back to sitting on the side of a flat bed without using bedrails?: Total Help needed moving to and from a bed to a chair (including a wheelchair)?: Total Help needed standing up from a chair using your arms (e.g., wheelchair or bedside chair)?: Total Help needed to walk in hospital room?: Total Help needed climbing 3-5 steps with a railing? : Total 6 Click Score: 6    End of Session   Activity Tolerance: Treatment limited secondary to  agitation Patient left: in bed;with call bell/phone within reach;with nursing/sitter in room;with restraints reapplied Nurse Communication: Mobility status PT Visit Diagnosis: Muscle weakness (generalized) (M62.81)    Time: 3295-1884 PT Time Calculation (min) (ACUTE ONLY): 17 min   Charges:   PT Evaluation $PT Eval Low Complexity: 1 Low         Blanchard Kelch PT Acute Rehabilitation Services Pager [phone] Office [phone]   Rada Hay 03/22/2021, 12:52 PM

## 2021-03-23 DIAGNOSIS — Z515 Encounter for palliative care: Secondary | ICD-10-CM

## 2021-03-23 DIAGNOSIS — Z7189 Other specified counseling: Secondary | ICD-10-CM

## 2021-03-23 LAB — BASIC METABOLIC PANEL WITH GFR
Anion gap: 7 (ref 5–15)
BUN: 36 mg/dL — ABNORMAL HIGH (ref 8–23)
CO2: 25 mmol/L (ref 22–32)
Calcium: 8.8 mg/dL — ABNORMAL LOW (ref 8.9–10.3)
Chloride: 103 mmol/L (ref 98–111)
Creatinine, Ser: 1.66 mg/dL — ABNORMAL HIGH (ref 0.61–1.24)
GFR, Estimated: 43 mL/min — ABNORMAL LOW
Glucose, Bld: 135 mg/dL — ABNORMAL HIGH (ref 70–99)
Potassium: 5.1 mmol/L (ref 3.5–5.1)
Sodium: 135 mmol/L (ref 135–145)

## 2021-03-23 LAB — CBC WITH DIFFERENTIAL/PLATELET
Abs Immature Granulocytes: 0.02 K/uL (ref 0.00–0.07)
Basophils Absolute: 0 K/uL (ref 0.0–0.1)
Basophils Relative: 0 %
Eosinophils Absolute: 0 K/uL (ref 0.0–0.5)
Eosinophils Relative: 1 %
HCT: 28.9 % — ABNORMAL LOW (ref 39.0–52.0)
Hemoglobin: 9.4 g/dL — ABNORMAL LOW (ref 13.0–17.0)
Immature Granulocytes: 0 %
Lymphocytes Relative: 28 %
Lymphs Abs: 1.4 K/uL (ref 0.7–4.0)
MCH: 29.3 pg (ref 26.0–34.0)
MCHC: 32.5 g/dL (ref 30.0–36.0)
MCV: 90 fL (ref 80.0–100.0)
Monocytes Absolute: 0.5 K/uL (ref 0.1–1.0)
Monocytes Relative: 9 %
Neutro Abs: 3.2 K/uL (ref 1.7–7.7)
Neutrophils Relative %: 62 %
Platelets: 142 K/uL — ABNORMAL LOW (ref 150–400)
RBC: 3.21 MIL/uL — ABNORMAL LOW (ref 4.22–5.81)
RDW: 13.2 % (ref 11.5–15.5)
WBC: 5.1 K/uL (ref 4.0–10.5)
nRBC: 0 % (ref 0.0–0.2)

## 2021-03-23 LAB — C-PEPTIDE: C-Peptide: 2.6 ng/mL (ref 1.1–4.4)

## 2021-03-23 LAB — GLUCOSE, CAPILLARY
Glucose-Capillary: 141 mg/dL — ABNORMAL HIGH (ref 70–99)
Glucose-Capillary: 116 mg/dL — ABNORMAL HIGH (ref 70–99)
Glucose-Capillary: 97 mg/dL (ref 70–99)
Glucose-Capillary: 118 mg/dL — ABNORMAL HIGH (ref 70–99)
Glucose-Capillary: 145 mg/dL — ABNORMAL HIGH (ref 70–99)
Glucose-Capillary: 155 mg/dL — ABNORMAL HIGH (ref 70–99)
Glucose-Capillary: 135 mg/dL — ABNORMAL HIGH (ref 70–99)

## 2021-03-23 LAB — MAGNESIUM: Magnesium: 1.7 mg/dL (ref 1.7–2.4)

## 2021-03-23 LAB — PHOSPHORUS: Phosphorus: 4.3 mg/dL (ref 2.5–4.6)

## 2021-03-23 MED ORDER — HYDROCORTISONE NA SUCCINATE PF 100 MG IJ SOLR
50.0000 mg | Freq: Every day | INTRAMUSCULAR | Status: DC
  Administered 2021-03-23 – 2021-03-25 (×3): 50 mg via INTRAVENOUS
  Filled 2021-03-23 (×3): qty 2

## 2021-03-23 NOTE — Progress Notes (Signed)
PROGRESS NOTE    Jack Rivas  LNL:[id number] DOB: [birth year] DOA: 03/19/2021 PCP: Patient, No Pcp Per (Inactive)    Chief Complaint  Patient presents with   Aspiration    Brief Narrative:  75 year old gentleman prior history of hypertension, dementia, dysphagia s/p PEG placement brought to ED with altered mental status patient is mostly nonverbal.  As per the family patient had extensive hospitalization for 6 months due to respiratory failure s/p trach and PEG placement and was discharged to a nursing home facility earlier this year.  In view of his chronic dysphagia and aspiration pneumonia patient had been on pured diet he was also recently admitted to [facility] for similar complaints and discharged to SNF.  Prior to this admission patient has been on pured diet with episodes of choking on food.  Patient's CT of the chest showed possible right lower lobe filtrates suspicious for aspiration pneumonia and his hospital course was complicated by significant hypoglycemia. Asimia episodes have improved with IV Solu-Cortef.   Assessment & Plan:   Active Problems:   Acute metabolic encephalopathy   Aspiration pneumonia (HCC)   Dementia (HCC)   Dysphagia   Malnutrition of moderate degree   Acute metabolic encephalopathy in the setting of advanced dementia Possibly secondary to aspiration pneumonia which also might be contributing to hypothermia and hypoglycemia. Initial CT of the head is negative for acute findings. MRI of the brain was limited due to motion but did not show any acute stroke. Patient was started empirically on IV Rocephin and Zithromax, patient is more alert today but on restraints. Patient is very high risk for recurrent aspiration. SLP on board and will continue to follow.    Hypothermia/hypoglycemia Probably secondary to aspiration pneumonia Of note patient has borderline cortisol levels.  IV Solu-Cortef was added with improvement in CBGs. Pending sulfonylurea  C-peptide and proinsulin levels which have been ordered yesterday.  We will slowly wean his dextrose fluids.  And his hypothermia has improved.     Essential hypertension Blood pressure parameters have been stable at this time.    Acute kidney injury Baseline creatinine appears to be around 1.8-1.9. Creatinine appears to be better than baseline.    Intermittent agitation Probably secondary to hospital-acquired delirium in the setting of history of dementia. Currently on soft restraints at this time. Also on Seroquel 25 mg in the morning and 50 mg at night. Patient is on Haldol as needed.       DVT prophylaxis: Heparin Code Status: Full code Family Communication: None at bedside disposition:   Status is: Inpatient  Remains inpatient appropriate because:IV treatments appropriate due to intensity of illness or inability to take PO  Dispo: The patient is from: SNF              Anticipated d/c is to: SNF              Patient currently is not medically stable to d/c.   Difficult to place patient No       Consultants:  None Procedures: (None Antimicrobials:  Antibiotics Given (last 72 hours)     Date/Time Action Medication Dose Rate   03/21/21 0438 New Bag/Given   cefTRIAXone (ROCEPHIN) 1 g in sodium chloride 0.9 % 100 mL IVPB 1 g 200 mL/hr   03/21/21 0605 New Bag/Given   azithromycin (ZITHROMAX) 500 mg in sodium chloride 0.9 % 250 mL IVPB 500 mg 250 mL/hr   03/22/21 0459 New Bag/Given   cefTRIAXone (ROCEPHIN) 1 g in sodium chloride  0.9 % 100 mL IVPB 1 g 200 mL/hr   03/22/21 0539 New Bag/Given   azithromycin (ZITHROMAX) 500 mg in sodium chloride 0.9 % 250 mL IVPB 500 mg 250 mL/hr   03/23/21 0433 New Bag/Given   cefTRIAXone (ROCEPHIN) 1 g in sodium chloride 0.9 % 100 mL IVPB 1 g 200 mL/hr   03/23/21 0533 New Bag/Given   azithromycin (ZITHROMAX) 500 mg in sodium chloride 0.9 % 250 mL IVPB 500 mg 250 mL/hr         Subjective: PT ALERT AND OPENING EYES ON  VERBAL COMMANDS.   Objective: Vitals:   03/23/21 0800 03/23/21 0900 03/23/21 1000 03/23/21 1114  BP: (!) 151/81 (!) 154/75 (!) 149/103 (!) 100/55  Pulse:      Resp: 14 13 15  (!) 9  Temp: (!) 96.5 F (35.8 C)     TempSrc: Axillary     SpO2: 98%   94%  Weight:      Height:        Intake/Output Summary (Last 24 hours) at 03/23/2021 1159 Last data filed at 03/23/2021 1000 Gross per 24 hour  Intake 2842.32 ml  Output 1450 ml  Net 1392.32 ml   Filed Weights   03/21/21 0451 03/22/21 0541 03/23/21 0500  Weight: 85.4 kg 87.6 kg 90.1 kg    Examination:  General exam: Appears calm and comfortable  Respiratory system: Clear to auscultation. Respiratory effort normal. Cardiovascular system: S1 & S2 heard, RRR. No JVD,  No pedal edema. Gastrointestinal system: Abdomen is nondistended, soft and nontender.  Normal bowel sounds heard. Central nervous system: Alert but confused.  Extremities: Symmetric 5 x 5 power. Skin: No rashes,  Psychiatry: pt is calm, but still in restraints.     Data Reviewed: I have personally reviewed following labs and imaging studies  CBC: Recent Labs  Lab 03/19/21 2209 03/20/21 0709 03/21/21 0420 03/23/21 0248  WBC 6.1 5.2 4.0 5.1  NEUTROABS  --   --  1.8 3.2  HGB 10.0* 9.8* 8.5* 9.4*  HCT 32.0* 30.3* 26.5* 28.9*  MCV 93.8 91.3 91.4 90.0  PLT 162 150 142* 142*    Basic Metabolic Panel: Recent Labs  Lab 03/19/21 2209 03/20/21 0251 03/20/21 0709 03/21/21 0420 03/22/21 0236 03/22/21 0933 03/23/21 0248  NA 142  --  142 139 139  --  135  K 6.2* 5.0 5.2* 4.5 4.9  --  5.1  CL 109  --  111 108 107  --  103  CO2 25  --  25 25 25   --  25  GLUCOSE 97  --  66* 121* 87 79 135*  BUN 52*  --  47* 41* 37*  --  36*  CREATININE 2.07*  --  1.88* 1.74* 1.66*  --  1.66*  CALCIUM 9.6  --  9.3 9.1 8.9  --  8.8*  MG  --   --   --  1.9  --   --  1.7  PHOS  --   --   --  4.6  --   --  4.3    GFR: Estimated Creatinine Clearance: 46 mL/min (A) (by C-G  formula based on SCr of 1.66 mg/dL (H)).  Liver Function Tests: Recent Labs  Lab 03/19/21 2209 03/20/21 0709  AST 53* 43*  ALT 91* 87*  ALKPHOS 71 67  BILITOT 0.8 0.4  PROT 8.1 7.6  ALBUMIN 3.5 3.3*    CBG: Recent Labs  Lab 03/22/21 1547 03/22/21 2019 03/23/21 0013 03/23/21 0419 03/23/21 05/23/21  GLUCAP 152* 198* 141* 116* 97     Recent Results (from the past 240 hour(s))  Resp Panel by RT-PCR (Flu A&B, Covid) Nasopharyngeal Swab     Status: None   Collection Time: 03/19/21 10:09 PM   Specimen: Nasopharyngeal Swab; Nasopharyngeal(NP) swabs in vial transport medium  Result Value Ref Range Status   SARS Coronavirus 2 by RT PCR NEGATIVE NEGATIVE Final    Comment: (NOTE) SARS-CoV-2 target nucleic acids are NOT DETECTED.  The SARS-CoV-2 RNA is generally detectable in upper respiratory specimens during the acute phase of infection. The lowest concentration of SARS-CoV-2 viral copies this assay can detect is 138 copies/mL. A negative result does not preclude SARS-Cov-2 infection and should not be used as the sole basis for treatment or other patient management decisions. A negative result may occur with  improper specimen collection/handling, submission of specimen other than nasopharyngeal swab, presence of viral mutation(s) within the areas targeted by this assay, and inadequate number of viral copies(<138 copies/mL). A negative result must be combined with clinical observations, patient history, and epidemiological information. The expected result is Negative.  Fact Sheet for Patients:  BloggerCourse.comhttps://www.fda.gov/media/152166/download  Fact Sheet for Healthcare Providers:  SeriousBroker.ithttps://www.fda.gov/media/152162/download  This test is no t yet approved or cleared by the [place] FDA and  has been authorized for detection and/or diagnosis of SARS-CoV-2 by FDA under an Emergency Use Authorization (EUA). This EUA will remain  in effect (meaning this test can be used) for the  duration of the COVID-19 declaration under Section 564(b)(1) of the Act, 21 U.S.C.section 360bbb-3(b)(1), unless the authorization is terminated  or revoked sooner.       Influenza A by PCR NEGATIVE NEGATIVE Final   Influenza B by PCR NEGATIVE NEGATIVE Final    Comment: (NOTE) The Xpert Xpress SARS-CoV-2/FLU/RSV plus assay is intended as an aid in the diagnosis of influenza from Nasopharyngeal swab specimens and should not be used as a sole basis for treatment. Nasal washings and aspirates are unacceptable for Xpert Xpress SARS-CoV-2/FLU/RSV testing.  Fact Sheet for Patients: BloggerCourse.comhttps://www.fda.gov/media/152166/download  Fact Sheet for Healthcare Providers: SeriousBroker.ithttps://www.fda.gov/media/152162/download  This test is not yet approved or cleared by the [place] FDA and has been authorized for detection and/or diagnosis of SARS-CoV-2 by FDA under an Emergency Use Authorization (EUA). This EUA will remain in effect (meaning this test can be used) for the duration of the COVID-19 declaration under Section 564(b)(1) of the Act, 21 U.S.C. section 360bbb-3(b)(1), unless the authorization is terminated or revoked.  Performed at [facility], [address] [place], [place] [address]   Blood culture (routine x 2)     Status: None (Preliminary result)   Collection Time: 03/19/21 10:24 PM   Specimen: BLOOD  Result Value Ref Range Status   Specimen Description   Final    BLOOD LEFT ANTECUBITAL Performed at [facility], [address] [place], [place] [address]    Special Requests   Final    BOTTLES DRAWN AEROBIC AND ANAEROBIC Blood Culture adequate volume Performed at [facility], [address] [place], [place] [address]    Culture   Final    NO GROWTH 3 DAYS Performed at [facility], [address] [place], [place] [address]    Report Status PENDING  Incomplete  Blood culture (routine x 2)     Status: None  (Preliminary result)   Collection Time: 03/19/21 10:24 PM   Specimen: BLOOD  Result Value Ref Range Status  Specimen Description   Final    BLOOD BLOOD LEFT HAND Performed at [facility], [address], [place], [place] [address]    Special Requests   Final    BOTTLES DRAWN AEROBIC AND ANAEROBIC Blood Culture results may not be optimal due to an inadequate volume of blood received in culture bottles Performed at [facility], [address], [place], [place] [address]    Culture   Final    NO GROWTH 3 DAYS Performed at [facility], [address] [place], [place] [address]    Report Status PENDING  Incomplete  Urine Culture     Status: Abnormal   Collection Time: 03/20/21  1:51 AM   Specimen: Urine, Clean Catch  Result Value Ref Range Status   Specimen Description   Final    URINE, CLEAN CATCH Performed at [facility], [address] [place], [place] [address]    Special Requests   Final    NONE Performed at [facility], [address] [place], [place] [address]    Culture MULTIPLE SPECIES PRESENT, SUGGEST RECOLLECTION (A)  Final   Report Status 03/21/2021 FINAL  Final  MRSA Next Gen by PCR, Nasal     Status: None   Collection Time: 03/22/21  8:12 AM   Specimen: Nasal Mucosa; Nasal Swab  Result Value Ref Range Status   MRSA by PCR Next Gen NOT DETECTED NOT DETECTED Final    Comment: (NOTE) The GeneXpert MRSA Assay (FDA approved for NASAL specimens only), is one component of a comprehensive MRSA colonization surveillance program. It is not intended to diagnose MRSA infection nor to guide or monitor treatment for MRSA infections. Test performance is not FDA approved in patients less than 40 years old. Performed at [facility], [address] [place], [place] [address]          Radiology Studies: No results found.      Scheduled Meds:  chlorhexidine   15 mL Mouth Rinse BID   Chlorhexidine Gluconate Cloth  6 each Topical Daily   famotidine  20 mg Per Tube QHS   feeding supplement (PROSource TF)  45 mL Per Tube Daily   free water  150 mL Per Tube Q4H   heparin  5,000 Units Subcutaneous Q8H   hydrocortisone sod succinate (SOLU-CORTEF) inj  50 mg Intravenous Daily   mouth rinse  15 mL Mouth Rinse q12n4p   melatonin  6 mg Per Tube QHS   polyethylene glycol  17 g Per Tube BID   QUEtiapine  25 mg Per Tube q morning   QUEtiapine  50 mg Per Tube QHS   Continuous Infusions:  azithromycin Stopped (03/23/21 7893)   cefTRIAXone (ROCEPHIN)  IV Stopped (03/23/21 0503)   dextrose 50 mL/hr at 03/23/21 1037   feeding supplement (OSMOLITE 1.5 CAL) 1,000 mL (03/23/21 1037)     LOS: 3 days       Kathlen Mody, MD [facility]   To contact the attending provider between 7A-7P or the covering provider during after hours 7P-7A, please log into the web site [email or web address] and access using universal Admire password for that web site. If you do not have the password, please call the hospital operator.  03/23/2021, 11:59 AM

## 2021-03-23 NOTE — TOC Progression Note (Signed)
Transition of Care [facility]) - Progression Note   Patient Details  Name: Jack Rivas MRN: [id number] Date of Birth: Jun 19, 1946  Transition of Care [facility]) CM/SW Contact  Ewing Schlein, LCSW Phone Number: 03/23/2021, 2:21 PM  Clinical Narrative: Daughter chose [facility] for SNF, but due to staff limitations [facility] is no longer able to accept the patient. CSW spoke with daughter and she selected [facility]. CSW spoke with Olegario Messier at [facility] and they can accept the patient with the PEG tube. [facility] is aware the patient has been vaccinated for COVID, but the family does not know where his COVID vaccine card is located. TOC to follow.  Expected Discharge Plan: Skilled Nursing Facility Barriers to Discharge: Continued Medical Work up  Expected Discharge Plan and Services Expected Discharge Plan: Skilled Nursing Facility In-house Referral: Clinical Social Work Post Acute Care Choice: Skilled Nursing Facility              DME Arranged: N/A DME Agency: NA  Readmission Risk Interventions No flowsheet data found.

## 2021-03-24 LAB — BASIC METABOLIC PANEL
Anion gap: 10 (ref 5–15)
BUN: 40 mg/dL — ABNORMAL HIGH (ref 8–23)
CO2: 26 mmol/L (ref 22–32)
Calcium: 8.7 mg/dL — ABNORMAL LOW (ref 8.9–10.3)
Chloride: 101 mmol/L (ref 98–111)
Creatinine, Ser: 1.77 mg/dL — ABNORMAL HIGH (ref 0.61–1.24)
GFR, Estimated: 40 mL/min — ABNORMAL LOW (ref >60)
Glucose, Bld: 91 mg/dL (ref 70–99)
Potassium: 5.1 mmol/L (ref 3.5–5.1)
Sodium: 137 mmol/L (ref 135–145)

## 2021-03-24 LAB — GLUCOSE, CAPILLARY
Glucose-Capillary: 121 mg/dL — ABNORMAL HIGH (ref 70–99)
Glucose-Capillary: 135 mg/dL — ABNORMAL HIGH (ref 70–99)
Glucose-Capillary: 140 mg/dL — ABNORMAL HIGH (ref 70–99)
Glucose-Capillary: 180 mg/dL — ABNORMAL HIGH (ref 70–99)
Glucose-Capillary: 67 mg/dL — ABNORMAL LOW (ref 70–99)
Glucose-Capillary: 88 mg/dL (ref 70–99)

## 2021-03-24 LAB — CBC WITH DIFFERENTIAL/PLATELET
Abs Immature Granulocytes: 0.01 10*3/uL (ref 0.00–0.07)
Basophils Absolute: 0 10*3/uL (ref 0.0–0.1)
Basophils Relative: 0 %
Eosinophils Absolute: 0.1 10*3/uL (ref 0.0–0.5)
Eosinophils Relative: 1 %
HCT: 26.7 % — ABNORMAL LOW (ref 39.0–52.0)
Hemoglobin: 8.7 g/dL — ABNORMAL LOW (ref 13.0–17.0)
Immature Granulocytes: 0 %
Lymphocytes Relative: 36 %
Lymphs Abs: 1.5 10*3/uL (ref 0.7–4.0)
MCH: 29.4 pg (ref 26.0–34.0)
MCHC: 32.6 g/dL (ref 30.0–36.0)
MCV: 90.2 fL (ref 80.0–100.0)
Monocytes Absolute: 0.3 10*3/uL (ref 0.1–1.0)
Monocytes Relative: 8 %
Neutro Abs: 2.3 10*3/uL (ref 1.7–7.7)
Neutrophils Relative %: 55 %
Platelets: 147 10*3/uL — ABNORMAL LOW (ref 150–400)
RBC: 2.96 MIL/uL — ABNORMAL LOW (ref 4.22–5.81)
RDW: 13.1 % (ref 11.5–15.5)
WBC: 4.2 10*3/uL (ref 4.0–10.5)
nRBC: 0 % (ref 0.0–0.2)

## 2021-03-24 MED ORDER — DEXTROSE 50 % IV SOLN
12.5000 g | INTRAVENOUS | Status: AC
  Administered 2021-03-24: 12.5 g via INTRAVENOUS
  Filled 2021-03-24: qty 50

## 2021-03-24 NOTE — TOC Progression Note (Signed)
Transition of Care [facility]) - Progression Note    Patient Details  Name: Jack Rivas MRN: [id number] Date of Birth: [birth year]  Transition of Care [facility]) CM/SW Contact  Darleene Cleaver, [place] Phone Number: 03/24/2021, 5:55 PM  Clinical Narrative:     CSW received message that patient's daughter wanted to talk with CSW regarding any discharge updates.  CSW spoke to daughter [name], and she asked if there are any other bed offers for SNF placement, CSW informed her no.  Patient's daughter had chosen [facility] or [facility].  Daughter asked if there would be any other chance a different facility may offer, and CSW told, her that updated clinicals can be sent out once patient is closer to discharge and CSW can check if there are new offers.  CSW also informed her that if patient has a feeding tube it may reduce the choices of SNFs.  Patient's daughter expressed understanding and in agreement.  CSW to send updated clinicals to SNFs once patient becomes more medically ready for discharge. CSW continuing to follow patient's progress.     Expected Discharge Plan: Skilled Nursing Facility Barriers to Discharge: Continued Medical Work up  Expected Discharge Plan and Services Expected Discharge Plan: Skilled Nursing Facility In-house Referral: Clinical Social Work   Post Acute Care Choice: Skilled Nursing Facility                   DME Arranged: N/A DME Agency: NA                   Social Determinants of Health (SDOH) Interventions    Readmission Risk Interventions No flowsheet data found.

## 2021-03-24 NOTE — Consult Note (Signed)
Consultation Note Date: 03/24/2021   Patient Name: Jack Rivas  DOB: [birth year]  MRN: [id number]  Age / Sex: 75 y.o., male  PCP: Patient, No Pcp Per (Inactive) Referring Physician: Kathlen Mody, MD  Reason for Consultation: Establishing goals of care  HPI/Patient Profile: 75 y.o. male  with past medical history of hypertension, dementia, dysphagia with PEG tube and recent extended admission to [facility] admitted on 03/19/2021 with altered mental status.  He had possible infiltrate concerning for aspiration pneumonia as well as significant hypoglycemia.  Hypoglycemic episodes have improved with IV Solu-Cortef.  He has continued to have periods of agitation.  Of note, he was living in [place] prior to admission to [facility] but his daughter brought him from facility there to the emergency room at [facility].  He had a prolonged admission but was then discharged to local skilled facility once his [place] Medicaid became active.  Clinical Assessment and Goals of Care: Palliative care consult received.  Chart reviewed including personal review of pertinent labs and imaging.  I spent a great deal of time reviewing prior notes from [facility] that are available in care everywhere.  During that admission, he was on the ACE unit (acute care for the elderly) and providers had multiple conversations with family regarding the same concerns we are currently dealing with with poor nutrition and aspiration.    it was noted there that family at the bedside continued to feed him despite known aspiration risk.  Also noted that during that encounter, he was DNR.  H&P reveals that this paperwork was torn up by his daughter on arrival to the hospital and he is remains full code at this time.  I saw and examined Mr. Fitchett today.  He was awake but is confused and does not have insight into his situation.  He is noted to be combative at  times but was calm during my encounter.  I called and was able to reach patient's daughter, [name].  Unfortunately, we did not have a good connection as it sounded as though [name] may have been in public environment [employer]?)  And it was difficult to hear each other.  We did discuss continued concerns about aspiration, poor nutrition, and declining functional status in light of progressive disease of dementia.  [name] reports that she wants to work to get him out of the hospital to another facility to see if he can rehab prior to making any other long-term care plans.  She is concerned to know what day he is going to discharge.  SUMMARY OF RECOMMENDATIONS   -Currently full code full scope -His daughter feels that he needs another opportunity to see how he does at skilled facility prior to further conversations regarding long-term goals of care.  I did voice concern that this likely to be recurrent issue with repeat admissions. -I would recommend outpatient palliative care to follow him at skilled facility  Code Status/Advance Care Planning: Full code  Palliative Prophylaxis:  Aspiration  Additional Recommendations (Limitations, Scope, Preferences): Full Scope  Treatment  Psycho-social/Spiritual:  Desire for further Chaplaincy support: Did not address today Additional Recommendations: Caregiving  Support/Resources  Prognosis:  Guarded  Discharge Planning: Skilled Nursing Facility for rehab with Palliative care service follow-up      Primary Diagnoses: Present on Admission:  Acute metabolic encephalopathy  Aspiration pneumonia (HCC)  Dementia (HCC)   I have reviewed the medical record, interviewed the patient and family, and examined the patient. The following aspects are pertinent.  Past Medical History:  Diagnosis Date   Hypertension    Social History   Socioeconomic History   Marital status: Single    Spouse name: Not on file   Number of children: Not on file    Years of education: Not on file   Highest education level: Not on file  Occupational History   Not on file  Tobacco Use   Smoking status: Light Smoker   Smokeless tobacco: Never  Substance and Sexual Activity   Alcohol use: Yes   Drug use: Never   Sexual activity: Not on file  Other Topics Concern   Not on file  Social History Narrative   Not on file   Social Determinants of Health   Financial Resource Strain: Not on file  Food Insecurity: Not on file  Transportation Needs: Not on file  Physical Activity: Not on file  Stress: Not on file  Social Connections: Not on file   History reviewed. No pertinent family history. Scheduled Meds:  chlorhexidine  15 mL Mouth Rinse BID   Chlorhexidine Gluconate Cloth  6 each Topical Daily   famotidine  20 mg Per Tube QHS   feeding supplement (PROSource TF)  45 mL Per Tube Daily   free water  150 mL Per Tube Q4H   heparin  5,000 Units Subcutaneous Q8H   hydrocortisone sod succinate (SOLU-CORTEF) inj  50 mg Intravenous Daily   mouth rinse  15 mL Mouth Rinse q12n4p   melatonin  6 mg Per Tube QHS   polyethylene glycol  17 g Per Tube BID   QUEtiapine  25 mg Per Tube q morning   QUEtiapine  50 mg Per Tube QHS   Continuous Infusions:  azithromycin Stopped (03/24/21 0707)   cefTRIAXone (ROCEPHIN)  IV Stopped (03/24/21 0436)   dextrose 100 mL/hr at 03/24/21 0840   feeding supplement (OSMOLITE 1.5 CAL) 1,000 mL (03/24/21 0400)   PRN Meds:.haloperidol lactate Medications Prior to Admission:  Prior to Admission medications   Medication Sig Start Date End Date Taking? Authorizing Provider  acetaminophen (TYLENOL) 325 MG tablet Take 650 mg by mouth every 6 (six) hours as needed for pain. 03/15/21  Yes [provider]  diclofenac Sodium (VOLTAREN) 1 % GEL Apply 1 application topically 2 (two) times daily. Apply to right knee 08/26/19  Yes [provider]  famotidine (PEPCID) 20 MG tablet Take 20 mg by mouth at bedtime. 03/15/21   Yes [provider]  melatonin 3 MG TABS tablet Take 6 mg by mouth at bedtime. 03/15/21  Yes [provider]  polyethylene glycol powder (GLYCOLAX/MIRALAX) 17 GM/SCOOP powder Take 17 g by mouth daily. Mix with 8oz of fluid 03/15/21  Yes [provider]  sodium zirconium cyclosilicate (LOKELMA) 10 g PACK packet Take 10 g by mouth daily. 03/15/21  Yes [provider]  acetaminophen (TYLENOL) 500 MG tablet Take no more than 1-2 tablets every 6 hours as needed for pain. Patient not taking: No sig reported 07/03/20   Mardella Layman, MD  amLODipine (NORVASC) 10 MG  tablet Take 10 mg by mouth daily. Patient not taking: Reported on 03/20/2021 05/08/20   [provider]  losartan (COZAAR) 50 MG tablet Take 50 mg by mouth daily. Patient not taking: Reported on 03/20/2021 05/12/20   [provider]  predniSONE (DELTASONE) 20 MG tablet Take 2 tablets (40 mg total) by mouth daily. Patient not taking: Reported on 03/20/2021 07/03/20   Mardella Layman, MD   No Known Allergies Review of Systems Unable to obtain  Physical Exam General: Alert, awake, in no acute distress.  Confused. HEENT: No bruits, no goiter, no JVD Heart: Regular rate and rhythm. No murmur appreciated. Lungs: Good air movement, clear Abdomen: Soft, nontender, nondistended, positive bowel sounds.   Ext: No significant edema Skin: Warm and dry  Vital Signs: BP (!) 149/102   Pulse 74   Temp 97.7 F (36.5 C) (Axillary)   Resp 11   Ht 6\' 3"  (1.905 m)   Wt 89.8 kg   SpO2 95%   BMI 24.74 kg/m  Pain Scale: PAINAD   Pain Score: 0-No pain   SpO2: SpO2: 95 % O2 Device:SpO2: 95 % O2 Flow Rate: .O2 Flow Rate (L/min): 2 L/min  IO: Intake/output summary:  Intake/Output Summary (Last 24 hours) at 03/24/2021 0851 Last data filed at 03/24/2021 0600 Gross per 24 hour  Intake 4016.68 ml  Output 700 ml  Net 3316.68 ml    LBM: Last BM Date: 03/21/21 Baseline Weight: Weight: 85.4 kg Most recent  weight: Weight: 89.8 kg     Palliative Assessment/Data:   Flowsheet Rows    Flowsheet Row Most Recent Value  Intake Tab   Referral Department Hospitalist  Unit at Time of Referral ICU  Palliative Care Primary Diagnosis Sepsis/Infectious Disease  Date Notified 03/22/21  Palliative Care Type New Palliative care  Reason for referral Clarify Goals of Care  Date of Admission 03/19/21  Date first seen by Palliative Care 03/23/21  # of days Palliative referral response time 1 Day(s)  # of days IP prior to Palliative referral 3  Clinical Assessment   Palliative Performance Scale Score 30%  Psychosocial & Spiritual Assessment   Palliative Care Outcomes   Patient/Family meeting held? Yes  Who was at the meeting? Daughter via phone       Time In: 1750 Time Out: 1850 Time Total: 60 Greater than 50%  of this time was spent counseling and coordinating care related to the above assessment and plan.  Signed by: 05/23/21, MD   Please contact Palliative Medicine Team phone at [phone] for questions and concerns.  For individual provider: See [phone]

## 2021-03-24 NOTE — Progress Notes (Signed)
  Speech Language Pathology Treatment: Dysphagia  Patient Details Name: Jack Rivas MRN: [id number] DOB: [birth year] Today's Date: 03/24/2021 Time: 7106-2694 SLP Time Calculation (min) (ACUTE ONLY): 20 min  Assessment / Plan / Recommendation Clinical Impression  No indication of aspiration with ice cream, Ensure mixture even with sequential consumption via straw.  Pt stated "come on" when SLP giving him intake - indicating SlP feeding him too slowly. He again is impulsive and takes large boluses if allowed.  Cough x2 after graham crackers - concerning for inadequate mastication/lingual loss into larynx.  No wet gurgly voice noted during entire snack.- RN reports observed x1 after pudding - indicative of likely secretions retained in larynx.     Recommend to advance to dys1/nectar and consider MBS next am to assure pt is on the least restrictive diet.  He repeatedly indicates desire for "hot dog" despite SLP advising this is not in the hospital.    Suspect his mentation may be near baseline and as long as pt has full supervision with strict precautions - he should tolerate po diet and this may improve his QOL/cooperation.  Will plan MBS 03/25/2021 with Md permission.    RN Zollie Scale informed and agreeable to plan. Pt did not report understanding to information reviewed but did not debate po offering.    HPI HPI: 75 year old gentleman admitted with Acute metabolic encephalopathy in a patient with advanced dementia. 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 Pt with history of hypertension, dementia, dysphagia with PEG tube in place. Pt was [facility] by his daughter last month from [place] where he was at an assisted living facility to be [facility] at [facility].  He was admitted with multiple issues including dysphagia and aspiration pneumonia.  Discharge note indicates patient eating mechanical soft with honey thick liquids. Pt reportedly had 2 MBS while  admitted. SLP notes not found, but radiology notes report silent aspriation of necter in one trial via straw, otherwise no aspiration.      SLP Plan  Continue with current plan of care;MBS       Recommendations  Diet recommendations: Dysphagia 1 (puree);Nectar-thick liquid Liquids provided via: Cup Medication Administration: Via alternative means Supervision: Full supervision/cueing for compensatory strategies;Trained caregiver to feed patient Compensations: Slow rate;Small sips/bites Postural Changes and/or Swallow Maneuvers: Seated upright 90 degrees (cue to cough if voice gurgly)                Oral Care Recommendations: Oral care QID;Staff/trained caregiver to provide oral care Follow up Recommendations: Skilled Nursing facility SLP Visit Diagnosis: Dysphagia, oropharyngeal phase (R13.12) Plan: Continue with current plan of care;MBS       GO               Rolena Infante, MS [facility] SLP Acute Rehab Services Office [phone] Pager [phone]   Chales Abrahams 03/24/2021, 1:02 PM

## 2021-03-24 NOTE — Progress Notes (Signed)
PROGRESS NOTE    Jack DikesJohn Rivas  NGE:[id number] DOB: Dec 02, 1945 DOA: 03/19/2021 PCP: Patient, No Pcp Per (Inactive)    Chief Complaint  Patient presents with   Aspiration    Brief Narrative:  75 year old gentleman prior history of hypertension, dementia, dysphagia s/p PEG placement brought to ED with altered mental status patient is mostly nonverbal.  As per the family patient had extensive hospitalization for 6 months due to respiratory failure s/p trach and PEG placement and was discharged to a nursing home facility earlier this year.  In view of his chronic dysphagia and aspiration pneumonia patient had been on pured diet he was also recently admitted to [facility] for similar complaints and discharged to SNF.  Prior to this admission patient has been on pured diet with episodes of choking on food.  Patient's CT of the chest showed possible right lower lobe filtrates suspicious for aspiration pneumonia and his hospital course was complicated by significant hypoglycemia. Hypoglycemia episodes have improved with IV Solu-Cortef.   Assessment & Plan:   Active Problems:   Acute metabolic encephalopathy   Aspiration pneumonia (HCC)   Dementia (HCC)   Dysphagia   Malnutrition of moderate degree   Acute metabolic encephalopathy in the setting of advanced dementia Possibly secondary to aspiration pneumonia which also might be contributing to hypothermia and hypoglycemia. Initial CT of the head is negative for acute findings. MRI of the brain was limited due to motion but did not show any acute stroke. Patient was started empirically on IV Rocephin and Zithromax,d/c antibiotics after 5 days.   patient is more alert today but on restraints. Patient is very high risk for recurrent aspiration. SLP on board and will continue to follow. Plan for MBS tomorrow.     Hypothermia/hypoglycemia Probably secondary to aspiration pneumonia Of note patient has borderline cortisol levels.  IV  Solu-Cortef was added with improvement in CBGs. Start weaning his solu cortef. C peptide level is wnl.  Pending sulfonylurea and proinsulin levels which have been ordered yesterday.  We will slowly wean his dextrose fluids.  And his hypothermia has improved.     Essential hypertension Blood pressure parameters have been optimal.     Acute kidney injury Baseline creatinine appears to be around 1.8-1.9. Creatinine appears to be better than baseline. Creatinine at 1.77 today.     Intermittent agitation Probably secondary to hospital-acquired delirium in the setting of history of dementia. Currently on soft restraints at this time. Also on Seroquel 25 mg in the morning and 50 mg at night. Patient is on Haldol as needed.       DVT prophylaxis: Heparin Code Status: Full code Family Communication: None at bedside , discussed with daughter over the phone today. disposition:   Status is: Inpatient  Remains inpatient appropriate because:IV treatments appropriate due to intensity of illness or inability to take PO  Dispo: The patient is from: SNF              Anticipated d/c is to: SNF              Patient currently is not medically stable to d/c.   Difficult to place patient No       Consultants:  None Procedures: (None Antimicrobials:  Antibiotics Given (last 72 hours)     Date/Time Action Medication Dose Rate   03/22/21 0459 New Bag/Given   cefTRIAXone (ROCEPHIN) 1 g in sodium chloride 0.9 % 100 mL IVPB 1 g 200 mL/hr   03/22/21 0539 New Bag/Given  azithromycin (ZITHROMAX) 500 mg in sodium chloride 0.9 % 250 mL IVPB 500 mg 250 mL/hr   03/23/21 0433 New Bag/Given   cefTRIAXone (ROCEPHIN) 1 g in sodium chloride 0.9 % 100 mL IVPB 1 g 200 mL/hr   03/23/21 0533 New Bag/Given   azithromycin (ZITHROMAX) 500 mg in sodium chloride 0.9 % 250 mL IVPB 500 mg 250 mL/hr   03/24/21 0406 New Bag/Given   cefTRIAXone (ROCEPHIN) 1 g in sodium chloride 0.9 % 100 mL IVPB 1 g 200  mL/hr   03/24/21 0607 New Bag/Given   azithromycin (ZITHROMAX) 500 mg in sodium chloride 0.9 % 250 mL IVPB 500 mg 250 mL/hr         Subjective: Pt alert. Asking for food.   Objective: Vitals:   03/24/21 0400 03/24/21 0500 03/24/21 0600 03/24/21 0700  BP: (!) 156/104  (!) 149/102   Pulse:      Resp: 12  11   Temp:    97.7 F (36.5 C)  TempSrc:    Axillary  SpO2: 95%     Weight:  89.8 kg    Height:        Intake/Output Summary (Last 24 hours) at 03/24/2021 1313 Last data filed at 03/24/2021 1300 Gross per 24 hour  Intake 3636.89 ml  Output 1450 ml  Net 2186.89 ml    Filed Weights   03/22/21 0541 03/23/21 0500 03/24/21 0500  Weight: 87.6 kg 90.1 kg 89.8 kg    Examination:  General exam: elderly gentleman chronically ill appearing on RA.  Respiratory system: diminished air entry at bases, no wheezing heard.  Cardiovascular system: S1 & S2 heard, RRR, no JVD, no pedal edema.  Gastrointestinal system: Abdomen is soft, non tender non distended bs+ Central nervous system: Alert but confused Extremities: No edema Skin: No rashes,  Psychiatry: pt is calm, but still in restraints.     Data Reviewed: I have personally reviewed following labs and imaging studies  CBC: Recent Labs  Lab 03/19/21 2209 03/20/21 0709 03/21/21 0420 03/23/21 0248 03/24/21 0239  WBC 6.1 5.2 4.0 5.1 4.2  NEUTROABS  --   --  1.8 3.2 2.3  HGB 10.0* 9.8* 8.5* 9.4* 8.7*  HCT 32.0* 30.3* 26.5* 28.9* 26.7*  MCV 93.8 91.3 91.4 90.0 90.2  PLT 162 150 142* 142* 147*     Basic Metabolic Panel: Recent Labs  Lab 03/20/21 0709 03/21/21 0420 03/22/21 0236 03/22/21 0933 03/23/21 0248 03/24/21 0850  NA 142 139 139  --  135 137  K 5.2* 4.5 4.9  --  5.1 5.1  CL 111 108 107  --  103 101  CO2 25 25 25   --  25 26  GLUCOSE 66* 121* 87 79 135* 91  BUN 47* 41* 37*  --  36* 40*  CREATININE 1.88* 1.74* 1.66*  --  1.66* 1.77*  CALCIUM 9.3 9.1 8.9  --  8.8* 8.7*  MG  --  1.9  --   --  1.7  --    PHOS  --  4.6  --   --  4.3  --      GFR: Estimated Creatinine Clearance: 43.1 mL/min (A) (by C-G formula based on SCr of 1.77 mg/dL (H)).  Liver Function Tests: Recent Labs  Lab 03/19/21 2209 03/20/21 0709  AST 53* 43*  ALT 91* 87*  ALKPHOS 71 67  BILITOT 0.8 0.4  PROT 8.1 7.6  ALBUMIN 3.5 3.3*     CBG: Recent Labs  Lab 03/23/21 2313 03/24/21 0318  03/24/21 0752 03/24/21 0842 03/24/21 1203  GLUCAP 135* 121* 67* 88 140*      Recent Results (from the past 240 hour(s))  Resp Panel by RT-PCR (Flu A&B, Covid) Nasopharyngeal Swab     Status: None   Collection Time: 03/19/21 10:09 PM   Specimen: Nasopharyngeal Swab; Nasopharyngeal(NP) swabs in vial transport medium  Result Value Ref Range Status   SARS Coronavirus 2 by RT PCR NEGATIVE NEGATIVE Final    Comment: (NOTE) SARS-CoV-2 target nucleic acids are NOT DETECTED.  The SARS-CoV-2 RNA is generally detectable in upper respiratory specimens during the acute phase of infection. The lowest concentration of SARS-CoV-2 viral copies this assay can detect is 138 copies/mL. A negative result does not preclude SARS-Cov-2 infection and should not be used as the sole basis for treatment or other patient management decisions. A negative result may occur with  improper specimen collection/handling, submission of specimen other than nasopharyngeal swab, presence of viral mutation(s) within the areas targeted by this assay, and inadequate number of viral copies(<138 copies/mL). A negative result must be combined with clinical observations, patient history, and epidemiological information. The expected result is Negative.  Fact Sheet for Patients:  BloggerCourse.com  Fact Sheet for Healthcare Providers:  SeriousBroker.it  This test is no t yet approved or cleared by the [place] FDA and  has been authorized for detection and/or diagnosis of SARS-CoV-2 by FDA under an  Emergency Use Authorization (EUA). This EUA will remain  in effect (meaning this test can be used) for the duration of the COVID-19 declaration under Section 564(b)(1) of the Act, 21 U.S.C.section 360bbb-3(b)(1), unless the authorization is terminated  or revoked sooner.       Influenza A by PCR NEGATIVE NEGATIVE Final   Influenza B by PCR NEGATIVE NEGATIVE Final    Comment: (NOTE) The Xpert Xpress SARS-CoV-2/FLU/RSV plus assay is intended as an aid in the diagnosis of influenza from Nasopharyngeal swab specimens and should not be used as a sole basis for treatment. Nasal washings and aspirates are unacceptable for Xpert Xpress SARS-CoV-2/FLU/RSV testing.  Fact Sheet for Patients: BloggerCourse.com  Fact Sheet for Healthcare Providers: SeriousBroker.it  This test is not yet approved or cleared by the [place] FDA and has been authorized for detection and/or diagnosis of SARS-CoV-2 by FDA under an Emergency Use Authorization (EUA). This EUA will remain in effect (meaning this test can be used) for the duration of the COVID-19 declaration under Section 564(b)(1) of the Act, 21 U.S.C. section 360bbb-3(b)(1), unless the authorization is terminated or revoked.  Performed at [facility], [address], [place], [place] [address]   Blood culture (routine x 2)     Status: None (Preliminary result)   Collection Time: 03/19/21 10:24 PM   Specimen: BLOOD  Result Value Ref Range Status   Specimen Description   Final    BLOOD LEFT ANTECUBITAL Performed at [facility], [address] [place], [place] [address]    Special Requests   Final    BOTTLES DRAWN AEROBIC AND ANAEROBIC Blood Culture adequate volume Performed at [facility], [address] [place], [place] [address]    Culture   Final    NO GROWTH 4 DAYS Performed at [facility], [address],  [place], [place] [address]    Report Status PENDING  Incomplete  Blood culture (routine x 2)     Status: None (Preliminary result)   Collection Time: 03/19/21 10:24 PM   Specimen:  BLOOD  Result Value Ref Range Status   Specimen Description   Final    BLOOD BLOOD LEFT HAND Performed at [facility], [address] [place], [place] [address]    Special Requests   Final    BOTTLES DRAWN AEROBIC AND ANAEROBIC Blood Culture results may not be optimal due to an inadequate volume of blood received in culture bottles Performed at [facility], [address] [place], [place] [address]    Culture   Final    NO GROWTH 4 DAYS Performed at [facility], [address] [place], [place] [address]    Report Status PENDING  Incomplete  Urine Culture     Status: Abnormal   Collection Time: 03/20/21  1:51 AM   Specimen: Urine, Clean Catch  Result Value Ref Range Status   Specimen Description   Final    URINE, CLEAN CATCH Performed at [facility], [address], [place], [place] [address]    Special Requests   Final    NONE Performed at [facility], [address] [place], [place] [address]    Culture MULTIPLE SPECIES PRESENT, SUGGEST RECOLLECTION (A)  Final   Report Status 03/21/2021 FINAL  Final  MRSA Next Gen by PCR, Nasal     Status: None   Collection Time: 03/22/21  8:12 AM   Specimen: Nasal Mucosa; Nasal Swab  Result Value Ref Range Status   MRSA by PCR Next Gen NOT DETECTED NOT DETECTED Final    Comment: (NOTE) The GeneXpert MRSA Assay (FDA approved for NASAL specimens only), is one component of a comprehensive MRSA colonization surveillance program. It is not intended to diagnose MRSA infection nor to guide or monitor treatment for MRSA infections. Test performance is not FDA approved in patients less than 76 years old. Performed at [facility], [address], [place],  [place] [address]           Radiology Studies: No results found.      Scheduled Meds:  chlorhexidine  15 mL Mouth Rinse BID   Chlorhexidine Gluconate Cloth  6 each Topical Daily   famotidine  20 mg Per Tube QHS   feeding supplement (PROSource TF)  45 mL Per Tube Daily   free water  150 mL Per Tube Q4H   heparin  5,000 Units Subcutaneous Q8H   hydrocortisone sod succinate (SOLU-CORTEF) inj  50 mg Intravenous Daily   mouth rinse  15 mL Mouth Rinse q12n4p   melatonin  6 mg Per Tube QHS   polyethylene glycol  17 g Per Tube BID   QUEtiapine  25 mg Per Tube q morning   QUEtiapine  50 mg Per Tube QHS   Continuous Infusions:  azithromycin Stopped (03/24/21 0707)   cefTRIAXone (ROCEPHIN)  IV Stopped (03/24/21 0436)   dextrose 100 mL/hr at 03/24/21 0840   feeding supplement (OSMOLITE 1.5 CAL) 1,000 mL (03/24/21 0400)     LOS: 4 days       Kathlen Mody, MD [facility]   To contact the attending provider between 7A-7P or the covering provider during after hours 7P-7A, please log into the web site [email or web address] and access using universal [place] password for that web site. If you do not have the password, please call the hospital operator.  03/24/2021, 1:13 PM

## 2021-03-25 ENCOUNTER — Inpatient Hospital Stay (HOSPITAL_COMMUNITY): Payer: Medicare Other

## 2021-03-25 LAB — CBC WITH DIFFERENTIAL/PLATELET
Abs Immature Granulocytes: 0.01 10*3/uL (ref 0.00–0.07)
Basophils Absolute: 0 10*3/uL (ref 0.0–0.1)
Basophils Relative: 0 %
Eosinophils Absolute: 0.2 10*3/uL (ref 0.0–0.5)
Eosinophils Relative: 3 %
HCT: 29.5 % — ABNORMAL LOW (ref 39.0–52.0)
Hemoglobin: 9.6 g/dL — ABNORMAL LOW (ref 13.0–17.0)
Immature Granulocytes: 0 %
Lymphocytes Relative: 40 %
Lymphs Abs: 1.9 10*3/uL (ref 0.7–4.0)
MCH: 29.3 pg (ref 26.0–34.0)
MCHC: 32.5 g/dL (ref 30.0–36.0)
MCV: 89.9 fL (ref 80.0–100.0)
Monocytes Absolute: 0.4 10*3/uL (ref 0.1–1.0)
Monocytes Relative: 8 %
Neutro Abs: 2.2 10*3/uL (ref 1.7–7.7)
Neutrophils Relative %: 49 %
Platelets: 148 10*3/uL — ABNORMAL LOW (ref 150–400)
RBC: 3.28 MIL/uL — ABNORMAL LOW (ref 4.22–5.81)
RDW: 13 % (ref 11.5–15.5)
WBC: 4.6 10*3/uL (ref 4.0–10.5)
nRBC: 0 % (ref 0.0–0.2)

## 2021-03-25 LAB — GLUCOSE, CAPILLARY
Glucose-Capillary: 112 mg/dL — ABNORMAL HIGH (ref 70–99)
Glucose-Capillary: 96 mg/dL (ref 70–99)
Glucose-Capillary: 102 mg/dL — ABNORMAL HIGH (ref 70–99)
Glucose-Capillary: 132 mg/dL — ABNORMAL HIGH (ref 70–99)
Glucose-Capillary: 106 mg/dL — ABNORMAL HIGH (ref 70–99)

## 2021-03-25 LAB — CULTURE, BLOOD (ROUTINE X 2)
Culture: NO GROWTH
Culture: NO GROWTH
Special Requests: ADEQUATE

## 2021-03-25 MED ORDER — FOOD THICKENER (SIMPLYTHICK)
1.0000 | ORAL | Status: DC | PRN
  Filled 2021-03-25 (×6): qty 1

## 2021-03-25 MED ORDER — ACETAMINOPHEN 325 MG PO TABS
650.0000 mg | ORAL_TABLET | ORAL | Status: DC | PRN
  Administered 2021-03-27: 650 mg via ORAL
  Filled 2021-03-25: qty 2

## 2021-03-25 NOTE — Progress Notes (Signed)
Modified Barium Swallow Progress Note  Patient Details  Name: Jack Rivas MRN: [id number] Date of Birth: 16-Aug-1945  Today's Date: 03/25/2021  Modified Barium Swallow completed.  Full report located under Chart Review in the Imaging Section.  Brief recommendations include the following:  Clinical Impression  Patient presents with oral more than pharyngeal dysphagia c/b decreased oral coordination with premature spillage of barium into pharynx.  Solids accumulate to pyriform sinus region with swallow triggering at 8 seconds.  Aspiration of thin noted due to delay in swallow and barium mixing with secretions.  Pharyngeal swallow is strong but DELAYED and this delay, decreased ability to follow directions and with weak cough - raise pt's aspiration pna risk significantly.   Recommend pt have puree/nectar and allowed tsp of thin.  Soft solids from family when visiting ok but institutionalized feeding still recommend dys1/nectar.  Oral suctioning advised before and after meals and strict dental brushing am and pm.   Follow up briefly for dysphagia management at SNF advised to determine appropriateness for advancement.  Skilled SLP included trials of compensations - chin tuck inadquately performed and cued cough/dry swallows not helpful.   Swallow Evaluation Recommendations       SLP Diet Recommendations: Nectar thick liquid;Thin liquid;Dysphagia 1 (Puree) solids (tsps thin)   Liquid Administration via: Cup;Straw   Medication Administration: Via alternative means   Supervision: Patient able to self feed   Compensations: Slow rate;Small sips/bites (have pt orally suction before and after meals - aspirates secretions)   Postural Changes: Remain semi-upright after after feeds/meals (Comment);Seated upright at 90 degrees   Oral Care Recommendations: Oral care BID      Rolena Infante, MS [facility] SLP Acute Rehab Services Office [phone] Pager [phone]   Chales Abrahams 03/25/2021,9:42 AM

## 2021-03-25 NOTE — Progress Notes (Signed)
PROGRESS NOTE    Iris Tatsch  TKP:[id number] DOB: [birth year] DOA: 03/19/2021 PCP: Patient, No Pcp Per (Inactive)    Chief Complaint  Patient presents with   Aspiration    Brief Narrative:  75 year old gentleman prior history of hypertension, dementia, dysphagia s/p PEG placement brought to ED with altered mental status patient is mostly nonverbal.  As per the family patient had extensive hospitalization for 6 months due to respiratory failure s/p trach and PEG placement and was discharged to a nursing home facility earlier this year.  In view of his chronic dysphagia and aspiration pneumonia patient had been on pured diet he was also recently admitted to [facility] for similar complaints and discharged to SNF.  Prior to this admission patient has been on pured diet with episodes of choking on food.  Patient's CT of the chest showed possible right lower lobe filtrates suspicious for aspiration pneumonia and his hospital course was complicated by significant hypoglycemia. Hypoglycemia episodes have improved with IV Solu-Cortef.   Assessment & Plan:   Active Problems:   Acute metabolic encephalopathy   Aspiration pneumonia (HCC)   Dementia (HCC)   Dysphagia   Malnutrition of moderate degree   Acute metabolic encephalopathy in the setting of advanced dementia Possibly secondary to aspiration pneumonia which also might be contributing to hypothermia and hypoglycemia. Initial CT of the head is negative for acute findings. MRI of the brain was limited due to motion but did not show any acute stroke. Patient was started empirically on IV Rocephin and Zithromax, completed the course of antibiotics Patient is alert and off restraints today since the afternoon Patient is very high risk for recurrent aspiration. SLP on board and will continue to follow.  Modified barium swallow done and recommended dysphagia 1 diet with nectar thick liquids    Hypothermia/hypoglycemia Probably secondary  to aspiration pneumonia Of note patient has borderline cortisol levels.  IV Solu-Cortef was added with improvement in CBGs. Start weaning his solu cortef. C peptide level is wnl.  DC Solu-Cortef today Pending sulfonylurea and proinsulin levels which have been ordered  Decrease the dextrose infusion to 50 mL/h and continue with Prosource 45 mL/h    Essential hypertension Blood pressure parameters have been elevated slightly.  We will start him on as needed hydralazine.    Acute kidney injury Baseline creatinine appears to be around 1.8-1.9. Creatinine appears to be better than baseline. Creatinine at 1.77 today.     Intermittent agitation Probably secondary to hospital-acquired delirium in the setting of history of dementia. Also on Seroquel 25 mg in the morning and 50 mg at night. Patient is on Haldol as needed. Will plan to be taken off his restraints tonight. Continue with melatonin 6 mg at bedtime       DVT prophylaxis: Heparin Code Status: Full code Family Communication: Discussed with daughter at bedside disposition:   Status is: Inpatient  Remains inpatient appropriate because:IV treatments appropriate due to intensity of illness or inability to take PO  Dispo: The patient is from: SNF              Anticipated d/c is to: SNF              Patient currently is not medically stable to d/c.   Difficult to place patient No       Consultants:  None Procedures: (None Antimicrobials:  Antibiotics Given (last 72 hours)     Date/Time Action Medication Dose Rate   03/23/21 0433 New Bag/Given  cefTRIAXone (ROCEPHIN) 1 g in sodium chloride 0.9 % 100 mL IVPB 1 g 200 mL/hr   03/23/21 0533 New Bag/Given   azithromycin (ZITHROMAX) 500 mg in sodium chloride 0.9 % 250 mL IVPB 500 mg 250 mL/hr   03/24/21 0406 New Bag/Given   cefTRIAXone (ROCEPHIN) 1 g in sodium chloride 0.9 % 100 mL IVPB 1 g 200 mL/hr   03/24/21 0607 New Bag/Given   azithromycin (ZITHROMAX) 500 mg in  sodium chloride 0.9 % 250 mL IVPB 500 mg 250 mL/hr         Subjective: Patient is alert and communicating  Objective: Vitals:   03/24/21 2350 03/25/21 0614 03/25/21 0750 03/25/21 1300  BP: (!) 186/89 (!) 145/85 (!) 152/79 (!) 150/80  Pulse: (!) 57 (!) 53 60 (!) 56  Resp: 14 14  16   Temp: (!) 97.5 F (36.4 C) 97.9 F (36.6 C) (!) 97.1 F (36.2 C) (!) 97 F (36.1 C)  TempSrc: Oral Oral Oral Oral  SpO2: 100% 100% 100% 100%  Weight:  93.4 kg    Height:        Intake/Output Summary (Last 24 hours) at 03/25/2021 1514 Last data filed at 03/25/2021 1300 Gross per 24 hour  Intake 2748.68 ml  Output 3300 ml  Net -551.32 ml    Filed Weights   03/24/21 0500 03/24/21 2009 03/25/21 0614  Weight: 89.8 kg 93.4 kg 93.4 kg    Examination:  General exam: Elderly gentleman, appears comfortable not in any kind of distress Respiratory system: Air entry fair bilateral on room air no tachypnea Cardiovascular system: S1-S2 heard, regular rate rhythm, no JVD or pedal edema Gastrointestinal system: Abdomen is soft nontender bowel sounds normal Central nervous system: Alert, able to answer simple questions Extremities: No pedal edema Skin: No rashes,  Psychiatry: Patient is more calmer today    Data Reviewed: I have personally reviewed following labs and imaging studies  CBC: Recent Labs  Lab 03/20/21 0709 03/21/21 0420 03/23/21 0248 03/24/21 0239 03/25/21 0516  WBC 5.2 4.0 5.1 4.2 4.6  NEUTROABS  --  1.8 3.2 2.3 2.2  HGB 9.8* 8.5* 9.4* 8.7* 9.6*  HCT 30.3* 26.5* 28.9* 26.7* 29.5*  MCV 91.3 91.4 90.0 90.2 89.9  PLT 150 142* 142* 147* 148*     Basic Metabolic Panel: Recent Labs  Lab 03/20/21 0709 03/21/21 0420 03/22/21 0236 03/22/21 0933 03/23/21 0248 03/24/21 0850  NA 142 139 139  --  135 137  K 5.2* 4.5 4.9  --  5.1 5.1  CL 111 108 107  --  103 101  CO2 25 25 25   --  25 26  GLUCOSE 66* 121* 87 79 135* 91  BUN 47* 41* 37*  --  36* 40*  CREATININE 1.88* 1.74*  1.66*  --  1.66* 1.77*  CALCIUM 9.3 9.1 8.9  --  8.8* 8.7*  MG  --  1.9  --   --  1.7  --   PHOS  --  4.6  --   --  4.3  --      GFR: Estimated Creatinine Clearance: 43.1 mL/min (A) (by C-G formula based on SCr of 1.77 mg/dL (H)).  Liver Function Tests: Recent Labs  Lab 03/19/21 2209 03/20/21 0709  AST 53* 43*  ALT 91* 87*  ALKPHOS 71 67  BILITOT 0.8 0.4  PROT 8.1 7.6  ALBUMIN 3.5 3.3*     CBG: Recent Labs  Lab 03/24/21 1554 03/24/21 2352 03/25/21 0617 03/25/21 0800 03/25/21 1155  GLUCAP 180*  135* 112* 96 102*      Recent Results (from the past 240 hour(s))  Resp Panel by RT-PCR (Flu A&B, Covid) Nasopharyngeal Swab     Status: None   Collection Time: 03/19/21 10:09 PM   Specimen: Nasopharyngeal Swab; Nasopharyngeal(NP) swabs in vial transport medium  Result Value Ref Range Status   SARS Coronavirus 2 by RT PCR NEGATIVE NEGATIVE Final    Comment: (NOTE) SARS-CoV-2 target nucleic acids are NOT DETECTED.  The SARS-CoV-2 RNA is generally detectable in upper respiratory specimens during the acute phase of infection. The lowest concentration of SARS-CoV-2 viral copies this assay can detect is 138 copies/mL. A negative result does not preclude SARS-Cov-2 infection and should not be used as the sole basis for treatment or other patient management decisions. A negative result may occur with  improper specimen collection/handling, submission of specimen other than nasopharyngeal swab, presence of viral mutation(s) within the areas targeted by this assay, and inadequate number of viral copies(<138 copies/mL). A negative result must be combined with clinical observations, patient history, and epidemiological information. The expected result is Negative.  Fact Sheet for Patients:  BloggerCourse.com  Fact Sheet for Healthcare Providers:  SeriousBroker.it  This test is no t yet approved or cleared by the [place]  FDA and  has been authorized for detection and/or diagnosis of SARS-CoV-2 by FDA under an Emergency Use Authorization (EUA). This EUA will remain  in effect (meaning this test can be used) for the duration of the COVID-19 declaration under Section 564(b)(1) of the Act, 21 U.S.C.section 360bbb-3(b)(1), unless the authorization is terminated  or revoked sooner.       Influenza A by PCR NEGATIVE NEGATIVE Final   Influenza B by PCR NEGATIVE NEGATIVE Final    Comment: (NOTE) The Xpert Xpress SARS-CoV-2/FLU/RSV plus assay is intended as an aid in the diagnosis of influenza from Nasopharyngeal swab specimens and should not be used as a sole basis for treatment. Nasal washings and aspirates are unacceptable for Xpert Xpress SARS-CoV-2/FLU/RSV testing.  Fact Sheet for Patients: BloggerCourse.com  Fact Sheet for Healthcare Providers: SeriousBroker.it  This test is not yet approved or cleared by the [place] FDA and has been authorized for detection and/or diagnosis of SARS-CoV-2 by FDA under an Emergency Use Authorization (EUA). This EUA will remain in effect (meaning this test can be used) for the duration of the COVID-19 declaration under Section 564(b)(1) of the Act, 21 U.S.C. section 360bbb-3(b)(1), unless the authorization is terminated or revoked.  Performed at [facility], [address], [place], [place] [address]   Blood culture (routine x 2)     Status: None   Collection Time: 03/19/21 10:24 PM   Specimen: BLOOD  Result Value Ref Range Status   Specimen Description   Final    BLOOD LEFT ANTECUBITAL Performed at [facility], [address] [place], [place] [address]    Special Requests   Final    BOTTLES DRAWN AEROBIC AND ANAEROBIC Blood Culture adequate volume Performed at [facility], [address] [place], [place] [address]    Culture   Final    NO  GROWTH 5 DAYS Performed at [facility], [address], [place], [place] [address]    Report Status 03/25/2021 FINAL  Final  Blood culture (routine x 2)     Status: None   Collection Time: 03/19/21 10:24 PM   Specimen: BLOOD  Result Value Ref Range Status   Specimen Description  Final    BLOOD BLOOD LEFT HAND Performed at [facility], [address] [place], [place] [address]    Special Requests   Final    BOTTLES DRAWN AEROBIC AND ANAEROBIC Blood Culture results may not be optimal due to an inadequate volume of blood received in culture bottles Performed at [facility], [address], [place], [place] [address]    Culture   Final    NO GROWTH 5 DAYS Performed at [facility], [address] [place], [place] [address]    Report Status 03/25/2021 FINAL  Final  Urine Culture     Status: Abnormal   Collection Time: 03/20/21  1:51 AM   Specimen: Urine, Clean Catch  Result Value Ref Range Status   Specimen Description   Final    URINE, CLEAN CATCH Performed at [facility], [address] [place], [place] [address]    Special Requests   Final    NONE Performed at [facility], [address] [place], [place] [address]    Culture MULTIPLE SPECIES PRESENT, SUGGEST RECOLLECTION (A)  Final   Report Status 03/21/2021 FINAL  Final  MRSA Next Gen by PCR, Nasal     Status: None   Collection Time: 03/22/21  8:12 AM   Specimen: Nasal Mucosa; Nasal Swab  Result Value Ref Range Status   MRSA by PCR Next Gen NOT DETECTED NOT DETECTED Final    Comment: (NOTE) The GeneXpert MRSA Assay (FDA approved for NASAL specimens only), is one component of a comprehensive MRSA colonization surveillance program. It is not intended to diagnose MRSA infection nor to guide or monitor treatment for MRSA infections. Test performance is not FDA approved in patients less than 36 years old. Performed at [facility], [address] [place], [place] [address]           Radiology Studies: DG Swallowing Func-Speech Pathology  Result Date: 03/25/2021 Table formatting from the original result was not included. Objective Swallowing Evaluation: Type of Study: MBS-Modified Barium Swallow Study  Patient Details Name: Abiel Antrim MRN: [id number] Date of Birth: February 26, 1946 Today's Date: 03/25/2021 Time: SLP Start Time (ACUTE ONLY): 0840 -SLP Stop Time (ACUTE ONLY): 0903 SLP Time Calculation (min) (ACUTE ONLY): 23 min Past Medical History: Past Medical History: Diagnosis Date  Hypertension  Past Surgical History: Past Surgical History: Procedure Laterality Date  CATARACT EXTRACTION, BILATERAL    KNEE SURGERY   HPI: 75 year old gentleman admitted with Acute metabolic encephalopathy in a patient with advanced dementia. 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 Pt with history of hypertension, dementia, dysphagia with PEG tube in place. Pt was brought to [facility] by his daughter last month from [place] where he was at an assisted living facility to be here at [facility].  He was admitted with multiple issues including dysphagia and aspiration pneumonia.  Discharge note indicates patient eating mechanical soft with honey thick liquids. Pt reportedly had 2 MBS while admitted. SLP notes not found, but radiology notes report silent aspriation of necter in one trial via straw, otherwise no aspiration.  Subjective: pt awke in chair Assessment / Plan / Recommendation CHL IP CLINICAL IMPRESSIONS 03/25/2021 Clinical Impression Patient presents with oral more than pharyngeal dysphagia c/b decreased oral coordination with premature spillage of barium into pharynx.  Solids accumulate to pyriform sinus region with swallow triggering at 8 seconds.  Aspiration of thin noted due to delay in swallow  and barium mixing with secretions.  Pharyngeal swallow is strong but DELAYED and this delay,  decreased ability to follow directions and with weak cough - raise pt's aspiration pna risk significantly.   Recommend pt have puree/nectar and allowed tsp of thin.  Soft solids from family when visiting ok but institutionalized feeding still recommend dys1/nectar.  Oral suctioning advised before and after meals and strict dental brushing am and pm.   Follow up briefly for dysphagia management at SNF advised to determine appropriateness for advancement.  Skilled SLP included trials of compensations - chin tuck inadquately performed and cued cough/dry swallows not helpful. SLP Visit Diagnosis Dysphagia, oropharyngeal phase (R13.12) Attention and concentration deficit following -- Frontal lobe and executive function deficit following -- Impact on safety and function Moderate aspiration risk   CHL IP TREATMENT RECOMMENDATION 03/25/2021 Treatment Recommendations Therapy as outlined in treatment plan below   Prognosis 03/25/2021 Prognosis for Safe Diet Advancement Fair Barriers to Reach Goals Cognitive deficits;Severity of deficits Barriers/Prognosis Comment -- CHL IP DIET RECOMMENDATION 03/25/2021 SLP Diet Recommendations Nectar thick liquid;Thin liquid;Dysphagia 1 (Puree) solids Liquid Administration via Cup;Straw Medication Administration Via alternative means Compensations Slow rate;Small sips/bites Postural Changes Remain semi-upright after after feeds/meals (Comment);Seated upright at 90 degrees   CHL IP OTHER RECOMMENDATIONS 03/25/2021 Recommended Consults -- Oral Care Recommendations Oral care BID Other Recommendations --   CHL IP FOLLOW UP RECOMMENDATIONS 03/25/2021 Follow up Recommendations Skilled Nursing facility   [facility] IP FREQUENCY AND DURATION 03/25/2021 Speech Therapy Frequency (ACUTE ONLY) min 2x/week Treatment Duration 2 weeks      CHL IP ORAL PHASE 03/25/2021 Oral Phase Impaired Oral - Pudding Teaspoon -- Oral - Pudding Cup -- Oral - Honey Teaspoon -- Oral - Honey Cup -- Oral - Nectar Teaspoon Decreased bolus cohesion  Oral - Nectar Cup Decreased bolus cohesion;Premature spillage Oral - Nectar Straw Decreased bolus cohesion;Premature spillage Oral - Thin Teaspoon Decreased bolus cohesion;Premature spillage Oral - Thin Cup Decreased bolus cohesion;Lingual/palatal residue;Premature spillage Oral - Thin Straw Decreased bolus cohesion;Lingual/palatal residue;Premature spillage Oral - Puree Decreased bolus cohesion;Premature spillage Oral - Mech Soft Decreased bolus cohesion;Lingual/palatal residue;Premature spillage;Weak lingual manipulation;Piecemeal swallowing;Other (Comment) Oral - Regular -- Oral - Multi-Consistency -- Oral - Pill -- Oral Phase - Comment --  CHL IP PHARYNGEAL PHASE 03/25/2021 Pharyngeal Phase Impaired Pharyngeal- Pudding Teaspoon -- Pharyngeal -- Pharyngeal- Pudding Cup -- Pharyngeal -- Pharyngeal- Honey Teaspoon -- Pharyngeal -- Pharyngeal- Honey Cup -- Pharyngeal -- Pharyngeal- Nectar Teaspoon Delayed swallow initiation-pyriform sinuses;Delayed swallow initiation-vallecula Pharyngeal Material does not enter airway Pharyngeal- Nectar Cup Delayed swallow initiation-pyriform sinuses;Delayed swallow initiation-vallecula Pharyngeal Material enters airway, CONTACTS cords and not ejected out Pharyngeal- Nectar Straw Delayed swallow initiation-pyriform sinuses;Delayed swallow initiation-vallecula;Penetration/Aspiration during swallow Pharyngeal Material enters airway, CONTACTS cords and not ejected out Pharyngeal- Thin Teaspoon Delayed swallow initiation-pyriform sinuses;Penetration/Aspiration during swallow Pharyngeal Material enters airway, passes BELOW cords without attempt by patient to eject out (silent aspiration) Pharyngeal- Thin Cup Delayed swallow initiation-vallecula;Delayed swallow initiation-pyriform sinuses;Penetration/Aspiration during swallow Pharyngeal Material enters airway, passes BELOW cords without attempt by patient to eject out (silent aspiration) Pharyngeal- Thin Straw Delayed swallow  initiation-pyriform sinuses Pharyngeal Material enters airway, passes BELOW cords without attempt by patient to eject out (silent aspiration) Pharyngeal- Puree Delayed swallow initiation-pyriform sinuses Pharyngeal Material does not enter airway Pharyngeal- Mechanical Soft Delayed swallow initiation-pyriform sinuses Pharyngeal Material does not enter airway Pharyngeal- Regular -- Pharyngeal -- Pharyngeal- Multi-consistency -- Pharyngeal -- Pharyngeal- Pill -- Pharyngeal -- Pharyngeal Comment minimal pharyngeal retention mixed with secretions - with aspiration -  silent in nature-, pt did not cough on comman nor to aspiration - cough noted x3 during MBS - without barium visualized in proximal trachea or larynx at that time - ? delayed response to aspiration?  CHL IP CERVICAL ESOPHAGEAL PHASE 03/25/2021 Cervical Esophageal Phase Impaired Pudding Teaspoon -- Pudding Cup -- Honey Teaspoon -- Honey Cup -- Nectar Teaspoon -- Nectar Cup -- Nectar Straw -- Thin Teaspoon -- Thin Cup -- Thin Straw -- Puree -- Mechanical Soft -- Regular -- Multi-consistency -- Pill -- Cervical Esophageal Comment -- Rolena Infanteammy K, MS [facility] SLP Acute Rehab Services Office [phone] Pager [phone] Chales AbrahamsKimball, Tamara Ann 03/25/2021, 9:43 AM                   Scheduled Meds:  chlorhexidine  15 mL Mouth Rinse BID   Chlorhexidine Gluconate Cloth  6 each Topical Daily   famotidine  20 mg Per Tube QHS   feeding supplement (PROSource TF)  45 mL Per Tube Daily   free water  150 mL Per Tube Q4H   heparin  5,000 Units Subcutaneous Q8H   hydrocortisone sod succinate (SOLU-CORTEF) inj  50 mg Intravenous Daily   mouth rinse  15 mL Mouth Rinse q12n4p   melatonin  6 mg Per Tube QHS   polyethylene glycol  17 g Per Tube BID   QUEtiapine  25 mg Per Tube q morning   QUEtiapine  50 mg Per Tube QHS   Continuous Infusions:  dextrose 100 mL/hr at 03/25/21 1140   feeding supplement (OSMOLITE 1.5 CAL) 60 mL/hr at 03/25/21 0958     LOS: 5 days        Kathlen ModyVijaya Keasha Malkiewicz, MD [facility]   To contact the attending provider between 7A-7P or the covering provider during after hours 7P-7A, please log into the web site [email or web address] and access using universal [place] password for that web site. If you do not have the password, please call the hospital operator.  03/25/2021, 3:14 PM

## 2021-03-25 NOTE — Care Management Important Message (Signed)
Important Message  Patient Details IM Letter placed in room for Daughter Judi Cong. Name: Jack Rivas MRN: [id number] Date of Birth: [birth year]   Medicare Important Message Given:  Yes     Caren Macadam 03/25/2021, 10:02 AM

## 2021-03-25 NOTE — Progress Notes (Signed)
Physical Therapy Treatment Patient Details Name: Jack Rivas MRN: [id number] DOB: April 09, 1946 Today's Date: 03/25/2021       PT Comments     Patient improved ability to participate this date but continues to require repeated cues to complete tasks. Max+2 assist required for all mobility and pt completed 2 stands with HHA and 2 stands with Stedy. Pt with flexed/kyphotic trunk and hip flexed in standing. Pt with poor safety awareness and attempted to pick Lt foot up while sitting on Stedy paddles. Therapist manually prevented for safety as pt not following verbal commands. EOS pt Total/Max +2 assist for rolling to finish pericare 2/2 BM. Will continue to progress pt as able in acute setting.    03/25/21 1300  PT Visit Information  Last PT Received On 03/25/21  Assistance Needed +2  PT/OT/SLP Co-Evaluation/Treatment Yes  Reason for Co-Treatment For patient/therapist safety;To address functional/ADL transfers  PT goals addressed during session Mobility/safety with mobility;Balance;Proper use of DME  OT goals addressed during session ADL's and self-care  History of Present Illness 75 year old gentleman brought to the emergency room with altered mental status, recently DC'd from [facility] to SNF. Apparently, he had extensive hospitalization in [place] since January to March with respiratory failure on ventilator status post tracheostomy and PEG tube placement. Admitted with Acute metabolic encephalopathy in a patient with advanced dementia: Probably multifactorial.  Suspect aspiration pneumonia acute on chronic.  CT head with chronic findings.  MRI of the brain was limited but did not show any evidence of acute stroke.  Chest x-ray with right lower lobe airspace disease, treating as aspiration pneumonia. PMH includes hypertension, dementia, dysphagia with PEG tube in place  Subjective Data  Patient Stated Goal "i want some breakfast"  Precautions  Precautions Fall  Precaution Comments  PEG, mits and restraints not combative 9/9  Restrictions  Weight Bearing Restrictions No  Pain Assessment  Pain Assessment Faces  Faces Pain Scale 6  Pain Location knees, back  Pain Descriptors / Indicators Grimacing  Pain Intervention(s) Limited activity within patient's tolerance;Monitored during session;Repositioned  Cognition  Arousal/Alertness Awake/alert  Behavior During Therapy WFL for tasks assessed/performed  Overall Cognitive Status No family/caregiver present to determine baseline cognitive functioning  General Comments pt pleasant today. requires repeated cues for tasks and is tangental in conversation. pt has hx of dementia.  Bed Mobility  Overal bed mobility Needs Assistance  Bed Mobility Supine to Sit;Sit to Supine;Rolling  Rolling Max assist;+2 for physical assistance;+2 for safety/equipment  Supine to sit Mod assist;HOB elevated  Sit to supine Max assist;+2 for physical assistance  General bed mobility comments Multimodal cues for sequencing and Mod assist to bring LE's off EOB; pt able to use rail to initiate pivoting trunk to sit. Max +2 to return to supine safely. 2+ max asssit for rolling to finish pericare.  Transfers  Overall transfer level Needs assistance  Equipment used 2 person hand held assist  Transfer via Psychologist, forensic  Transfers Sit to/from Stand  Sit to Stand Max assist;+2 physical assistance;+2 safety/equipment;From elevated surface  General transfer comment Heavy Max +2 assist for sit<>stand from EOB x2 with 2HHA. pt unable to obtain fully upright posture. 2x sit<>stand with Stedy toincrease upright acitvity and allow for pericare due to significant BM in bed.  Balance  Overall balance assessment Needs assistance  Sitting-balance support Feet supported  Sitting balance-Leahy Scale Poor  Sitting balance - Comments reliant on UE support  Standing balance support Bilateral upper extremity supported  Standing balance-Leahy Scale Zero  Standing  balance comment max x2 and unable to come to complete stand in stedy  PT - End of Session  Equipment Utilized During Treatment Gait belt  Activity Tolerance Patient tolerated treatment well  Patient left in bed;with call bell/phone within reach;with bed alarm set;with restraints reapplied  Nurse Communication Mobility status   PT - Assessment/Plan  PT Plan Current plan remains appropriate  PT Visit Diagnosis Muscle weakness (generalized) (M62.81)  PT Frequency (ACUTE ONLY) Min 2X/week  Follow Up Recommendations SNF  PT equipment None recommended by PT  AM-PAC PT "6 Clicks" Mobility Outcome Measure (Version 2)  Help needed turning from your back to your side while in a flat bed without using bedrails? 1  Help needed moving from lying on your back to sitting on the side of a flat bed without using bedrails? 1  Help needed moving to and from a bed to a chair (including a wheelchair)? 1  Help needed standing up from a chair using your arms (e.g., wheelchair or bedside chair)? 1  Help needed to walk in hospital room? 1  Help needed climbing 3-5 steps with a railing?  1  6 Click Score 6  Consider Recommendation of Discharge To: CIR/SNF/LTACH  Progressive Mobility  What is the highest level of mobility based on the progressive mobility assessment? Level 2 (Chairfast) - Balance while sitting on edge of bed and cannot stand  Mobility Out of bed to chair with meals  PT Goal Progression  Progress towards PT goals Progressing toward goals  Acute Rehab PT Goals  PT Goal Formulation Patient unable to participate in goal setting  Time For Goal Achievement 04/05/21  Potential to Achieve Goals Fair  PT Time Calculation  PT Start Time (ACUTE ONLY) 0926  PT Stop Time (ACUTE ONLY) 0957  PT Time Calculation (min) (ACUTE ONLY) 31 min  PT General Charges  $$ ACUTE PT VISIT 1 Visit  PT Treatments  $Therapeutic Activity 8-22 mins    Wynn Maudlin, DPT Acute Rehabilitation Services Office  [phone] Pager [phone]   Anitra Lauth 03/25/2021, 6:23 PM

## 2021-03-25 NOTE — Progress Notes (Signed)
Occupational Therapy Treatment Patient Details Name: Jack Rivas MRN: [id number] DOB: 26-Nov-1945 Today's Date: 03/25/2021    History of present illness 75 year old gentleman brought to the emergency room with altered mental status, recently DC'd from [facility] to SNF. Apparently, he had extensive hospitalization in [place] since January to March with respiratory failure on ventilator status post tracheostomy and PEG tube placement. Admitted with Acute metabolic encephalopathy in a patient with advanced dementia: Probably multifactorial.  Suspect aspiration pneumonia acute on chronic.  CT head with chronic findings.  MRI of the brain was limited but did not show any evidence of acute stroke.  Chest x-ray with right lower lobe airspace disease, treating as aspiration pneumonia. PMH includes hypertension, dementia, dysphagia with PEG tube in place   OT comments  Patient much more pleasant this session, does not strike out at anytime with therapists. Still having difficulty following cues/initiating tasks and tangential conversation with inappropriate answers to questions at times (not making sense). Patient needing max x2 to power up to semi-stand position, kyphotic posture and unable to fully extend at hips but was able to stand enough to place stedy paddles under patient buttock. Note patient had bowel movement, attempted perianal care in semi stand however patient fatigued and completed with rolling in bed, total A for hygiene. Acute OT to follow.    Follow Up Recommendations  SNF;Supervision/Assistance - 24 hour    Equipment Recommendations  None recommended by OT       Precautions / Restrictions Precautions Precautions: Fall Precaution Comments: PEG, mits and restraints not combative 9/9       Mobility Bed Mobility Overal bed mobility: Needs Assistance Bed Mobility: Supine to Sit;Sit to Supine     Supine to sit: Mod assist;HOB elevated Sit to supine: Max assist;+2 for  physical assistance   General bed mobility comments: needing multimodal cues to bring legs to edge of bed and mod A for trunk. max x2 to position trunk and lift legs back onto bed at end of session    Transfers Overall transfer level: Needs assistance   Transfers: Sit to/from Stand Sit to Stand: Max assist;+2 physical assistance;+2 safety/equipment;From elevated surface         General transfer comment: please see toilet transfer in ADL section; heavy assist with third person present to assist with standing from stedy    Balance Overall balance assessment: Needs assistance Sitting-balance support: Feet supported Sitting balance-Leahy Scale: Fair     Standing balance support: Bilateral upper extremity supported Standing balance-Leahy Scale: Zero Standing balance comment: max x2 and unable to come to complete stand in stedy                           ADL either performed or assessed with clinical judgement   ADL Overall ADL's : Needs assistance/impaired                     Lower Body Dressing: Total assistance Lower Body Dressing Details (indicate cue type and reason): to don socks Toilet Transfer: Total assistance;+2 for physical assistance;+2 for safety/equipment;Cueing for safety;Cueing for sequencing Toilet Transfer Details (indicate cue type and reason): max A x2 to power up to semi stand enough to place stedy paddles underneath patient buttock, very flexed posture. max x2-3 to lift up from stedy to transition back onto the bed Toileting- Clothing Manipulation and Hygiene: Total assistance;+2 for physical assistance;+2 for safety/equipment;Sit to/from stand;Bed level Toileting - Clothing Manipulation Details (indicate cue  type and reason): attempted perianal care in semi-stand from stedy however patient fatigued therefore completed with rolling bed as patient soiled from bowel movement     Functional mobility during ADLs: Maximal assistance;+2 for physical  assistance;+2 for safety/equipment;Total assistance (stedy) General ADL Comments: patient better able to interact with therapists this session however still having difficulty with direction following and max difficulty with attempted standing      Cognition Arousal/Alertness: Awake/alert Behavior During Therapy: WFL for tasks assessed/performed Overall Cognitive Status: No family/caregiver present to determine baseline cognitive functioning                                 General Comments: per chart has history of dementia so unsure of baseline however still needing repetitve cues to follow 1 step directions and is not always consistently following. also tangential speech not always answering questions appropriately. more pleasant today did not get combative at all.                   Pertinent Vitals/ Pain       Pain Assessment: Faces Faces Pain Scale: Hurts even more Pain Location: knees, back Pain Descriptors / Indicators: Grimacing Pain Intervention(s): Monitored during session         Frequency  Min 2X/week        Progress Toward Goals  OT Goals(current goals can now be found in the care plan section)  Progress towards OT goals: Progressing toward goals  Acute Rehab OT Goals Patient Stated Goal: "i want some breakfast" OT Goal Formulation: With patient Time For Goal Achievement: 04/05/21 Potential to Achieve Goals: Fair ADL Goals Pt Will Perform Grooming: with supervision Pt Will Transfer to Toilet: with mod assist;stand pivot transfer;bedside commode Additional ADL Goal #1: Patient will follow 1 step directions with 50% multimodal cues in order to safely participate in self care tasks.  Plan Discharge plan remains appropriate    Co-evaluation    PT/OT/SLP Co-Evaluation/Treatment: Yes Reason for Co-Treatment: For patient/therapist safety;To address functional/ADL transfers PT goals addressed during session: Mobility/safety with mobility OT  goals addressed during session: ADL's and self-care      AM-PAC OT "6 Clicks" Daily Activity     Outcome Measure   Help from another person eating meals?: Total Help from another person taking care of personal grooming?: A Lot Help from another person toileting, which includes using toliet, bedpan, or urinal?: Total Help from another person bathing (including washing, rinsing, drying)?: A Lot Help from another person to put on and taking off regular upper body clothing?: A Lot Help from another person to put on and taking off regular lower body clothing?: Total 6 Click Score: 9    End of Session Equipment Utilized During Treatment: Gait belt (stedy)  OT Visit Diagnosis: Other abnormalities of gait and mobility (R26.89);Other symptoms and signs involving cognitive function   Activity Tolerance Patient tolerated treatment well   Patient Left in bed;with call bell/phone within reach;with bed alarm set;with restraints reapplied   Nurse Communication Mobility status;Need for lift equipment;Other (comment) (notified RN 1 mit missing however bilateral wrist restraints intact)        Time: [id number] OT Time Calculation (min): 34 min  Charges: OT General Charges $OT Visit: 1 Visit OT Treatments $Self Care/Home Management : 8-22 mins  Marlyce Huge OT OT pager: [phone]   Carmelia Roller 03/25/2021, 12:44 PM

## 2021-03-26 LAB — BASIC METABOLIC PANEL
Anion gap: 8 (ref 5–15)
BUN: 40 mg/dL — ABNORMAL HIGH (ref 8–23)
CO2: 30 mmol/L (ref 22–32)
Calcium: 9.3 mg/dL (ref 8.9–10.3)
Chloride: 101 mmol/L (ref 98–111)
Creatinine, Ser: 1.61 mg/dL — ABNORMAL HIGH (ref 0.61–1.24)
GFR, Estimated: 44 mL/min — ABNORMAL LOW (ref >60)
Glucose, Bld: 103 mg/dL — ABNORMAL HIGH (ref 70–99)
Potassium: 5 mmol/L (ref 3.5–5.1)
Sodium: 139 mmol/L (ref 135–145)

## 2021-03-26 LAB — GLUCOSE, CAPILLARY
Glucose-Capillary: 105 mg/dL — ABNORMAL HIGH (ref 70–99)
Glucose-Capillary: 106 mg/dL — ABNORMAL HIGH (ref 70–99)
Glucose-Capillary: 108 mg/dL — ABNORMAL HIGH (ref 70–99)
Glucose-Capillary: 111 mg/dL — ABNORMAL HIGH (ref 70–99)
Glucose-Capillary: 118 mg/dL — ABNORMAL HIGH (ref 70–99)
Glucose-Capillary: 90 mg/dL (ref 70–99)
Glucose-Capillary: 96 mg/dL (ref 70–99)
Glucose-Capillary: 99 mg/dL (ref 70–99)

## 2021-03-26 LAB — CBC WITH DIFFERENTIAL/PLATELET
Abs Immature Granulocytes: 0.01 10*3/uL (ref 0.00–0.07)
Basophils Absolute: 0 10*3/uL (ref 0.0–0.1)
Basophils Relative: 0 %
Eosinophils Absolute: 0.2 10*3/uL (ref 0.0–0.5)
Eosinophils Relative: 6 %
HCT: 31.3 % — ABNORMAL LOW (ref 39.0–52.0)
Hemoglobin: 10.3 g/dL — ABNORMAL LOW (ref 13.0–17.0)
Immature Granulocytes: 0 %
Lymphocytes Relative: 28 %
Lymphs Abs: 1.2 10*3/uL (ref 0.7–4.0)
MCH: 29.9 pg (ref 26.0–34.0)
MCHC: 32.9 g/dL (ref 30.0–36.0)
MCV: 91 fL (ref 80.0–100.0)
Monocytes Absolute: 0.3 10*3/uL (ref 0.1–1.0)
Monocytes Relative: 7 %
Neutro Abs: 2.6 10*3/uL (ref 1.7–7.7)
Neutrophils Relative %: 59 %
Platelets: 157 10*3/uL (ref 150–400)
RBC: 3.44 MIL/uL — ABNORMAL LOW (ref 4.22–5.81)
RDW: 13.2 % (ref 11.5–15.5)
WBC: 4.3 10*3/uL (ref 4.0–10.5)
nRBC: 0 % (ref 0.0–0.2)

## 2021-03-26 NOTE — Progress Notes (Signed)
PROGRESS NOTE    Jack Rivas  PYP:[id number] DOB: 1946/03/12 DOA: 03/19/2021 PCP: Patient, No Pcp Per (Inactive)    Chief Complaint  Patient presents with   Aspiration    Brief Narrative:  75 year old gentleman prior history of hypertension, dementia, dysphagia s/p PEG placement brought to ED with altered mental status patient is mostly nonverbal.  As per the family patient had extensive hospitalization for 6 months due to respiratory failure s/p trach and PEG placement and was discharged to a nursing home facility earlier this year.  In view of his chronic dysphagia and aspiration pneumonia patient had been on pured diet he was also recently admitted to [facility] for similar complaints and discharged to SNF.  Prior to this admission patient has been on pured diet with episodes of choking on food.  Patient's CT of the chest showed possible right lower lobe filtrates suspicious for aspiration pneumonia and his hospital course was complicated by significant hypoglycemia. Hypoglycemia episodes have improved with IV Solu-Cortef. Pt seen and examined at bedside, off restraints since yesterday.    Assessment & Plan:   Active Problems:   Acute metabolic encephalopathy   Aspiration pneumonia (HCC)   Dementia (HCC)   Dysphagia   Malnutrition of moderate degree   Acute metabolic encephalopathy in the setting of advanced dementia Possibly secondary to aspiration pneumonia which also might be contributing to hypothermia and hypoglycemia. Initial CT of the head is negative for acute findings. MRI of the brain was limited due to motion but did not show any acute stroke. Patient was started empirically on IV Rocephin and Zithromax, completed the course of antibiotics.  Urine for strep antigen and Legionella antigen is negative. Patient is alert and off restraints since yesterday. Patient is very high risk for recurrent aspiration. SLP on board and will continue to follow.  Modified barium  swallow done and recommended dysphagia 1 diet with nectar thick liquids    Hypothermia/hypoglycemia Probably secondary to aspiration pneumonia Of note patient has borderline cortisol levels.  IV Solu-Cortef was added with improvement in CBGs. Start weaning his solu cortef. C peptide level is wnl.  Discontinued Solu-Cortef and dextrose fluids. Pending sulfonylurea and proinsulin levels which have been ordered  continue with Prosource 45 mL/h    Essential hypertension Blood pressure parameters are better controlled today    Acute kidney injury Baseline creatinine appears to be around 1.8-1.9. Creatinine appears to be better than baseline. Creatinine at 1.6 today    Intermittent agitation Probably secondary to hospital-acquired delirium in the setting of history of dementia. Also on Seroquel 25 mg in the morning and 50 mg at night. Patient is on Haldol as needed. Will plan to be taken off his restraints tonight. Continue with melatonin 6 mg at bedtime       DVT prophylaxis: Heparin Code Status: Full code Family Communication: Discussed with daughter at bedside disposition:   Status is: Inpatient  Remains inpatient appropriate because:IV treatments appropriate due to intensity of illness or inability to take PO  Dispo: The patient is from: SNF              Anticipated d/c is to: SNF              Patient currently is medically stable to d/c.   Difficult to place patient No       Consultants:  None Procedures: (None Antimicrobials:  Antibiotics Given (last 72 hours)     Date/Time Action Medication Dose Rate   03/24/21 0406 New Bag/Given  cefTRIAXone (ROCEPHIN) 1 g in sodium chloride 0.9 % 100 mL IVPB 1 g 200 mL/hr   03/24/21 0607 New Bag/Given   azithromycin (ZITHROMAX) 500 mg in sodium chloride 0.9 % 250 mL IVPB 500 mg 250 mL/hr         Subjective: Patient has no new complaints  Objective: Vitals:   03/25/21 1300 03/25/21 2030 03/26/21 0559  03/26/21 0956  BP: (!) 150/80 (!) 154/91 (!) 142/86 (!) 137/91  Pulse: (!) 56 66 (!) 46 (!) 58  Resp: 16 18 18 18   Temp: (!) 97 F (36.1 C) 98 F (36.7 C) 98 F (36.7 C) 98.2 F (36.8 C)  TempSrc: Oral   Axillary  SpO2: 100% (!) 89%  92%  Weight:      Height:        Intake/Output Summary (Last 24 hours) at 03/26/2021 1249 Last data filed at 03/26/2021 [id number] Gross per 24 hour  Intake 6583.74 ml  Output 2125 ml  Net 4458.74 ml    Filed Weights   03/24/21 0500 03/24/21 2009 03/25/21 0614  Weight: 89.8 kg 93.4 kg 93.4 kg    Examination:  General exam: Elderly gentleman, no new complaints does not appear to be in distress at this time. Respiratory system: Air entry fair bilateral no wheezing or rhonchi Cardiovascular system: S1-S2 heard, regular rate rhythm no pedal edema Gastrointestinal system: Abdomen soft, nontender bowel sounds normal Central nervous system: No new deficits Extremities: No pedal edema Skin: No rashes seen Psychiatry: Mood appropriate    Data Reviewed: I have personally reviewed following labs and imaging studies  CBC: Recent Labs  Lab 03/21/21 0420 03/23/21 0248 03/24/21 0239 03/25/21 0516 03/26/21 0659  WBC 4.0 5.1 4.2 4.6 4.3  NEUTROABS 1.8 3.2 2.3 2.2 2.6  HGB 8.5* 9.4* 8.7* 9.6* 10.3*  HCT 26.5* 28.9* 26.7* 29.5* 31.3*  MCV 91.4 90.0 90.2 89.9 91.0  PLT 142* 142* 147* 148* 157     Basic Metabolic Panel: Recent Labs  Lab 03/21/21 0420 03/22/21 0236 03/22/21 0933 03/23/21 0248 03/24/21 0850 03/26/21 0659  NA 139 139  --  135 137 139  K 4.5 4.9  --  5.1 5.1 5.0  CL 108 107  --  103 101 101  CO2 25 25  --  25 26 30   GLUCOSE 121* 87 79 135* 91 103*  BUN 41* 37*  --  36* 40* 40*  CREATININE 1.74* 1.66*  --  1.66* 1.77* 1.61*  CALCIUM 9.1 8.9  --  8.8* 8.7* 9.3  MG 1.9  --   --  1.7  --   --   PHOS 4.6  --   --  4.3  --   --      GFR: Estimated Creatinine Clearance: 47.4 mL/min (A) (by C-G formula based on SCr of 1.61 mg/dL  (H)).  Liver Function Tests: Recent Labs  Lab 03/19/21 2209 03/20/21 0709  AST 53* 43*  ALT 91* 87*  ALKPHOS 71 67  BILITOT 0.8 0.4  PROT 8.1 7.6  ALBUMIN 3.5 3.3*     CBG: Recent Labs  Lab 03/26/21 0012 03/26/21 0557 03/26/21 0618 03/26/21 0721 03/26/21 1128  GLUCAP 118* 96 99 106* 111*      Recent Results (from the past 240 hour(s))  Resp Panel by RT-PCR (Flu A&B, Covid) Nasopharyngeal Swab     Status: None   Collection Time: 03/19/21 10:09 PM   Specimen: Nasopharyngeal Swab; Nasopharyngeal(NP) swabs in vial transport medium  Result Value Ref Range Status  SARS Coronavirus 2 by RT PCR NEGATIVE NEGATIVE Final    Comment: (NOTE) SARS-CoV-2 target nucleic acids are NOT DETECTED.  The SARS-CoV-2 RNA is generally detectable in upper respiratory specimens during the acute phase of infection. The lowest concentration of SARS-CoV-2 viral copies this assay can detect is 138 copies/mL. A negative result does not preclude SARS-Cov-2 infection and should not be used as the sole basis for treatment or other patient management decisions. A negative result may occur with  improper specimen collection/handling, submission of specimen other than nasopharyngeal swab, presence of viral mutation(s) within the areas targeted by this assay, and inadequate number of viral copies(<138 copies/mL). A negative result must be combined with clinical observations, patient history, and epidemiological information. The expected result is Negative.  Fact Sheet for Patients:  BloggerCourse.com  Fact Sheet for Healthcare Providers:  SeriousBroker.it  This test is no t yet approved or cleared by the [place] FDA and  has been authorized for detection and/or diagnosis of SARS-CoV-2 by FDA under an Emergency Use Authorization (EUA). This EUA will remain  in effect (meaning this test can be used) for the duration of the COVID-19  declaration under Section 564(b)(1) of the Act, 21 U.S.C.section 360bbb-3(b)(1), unless the authorization is terminated  or revoked sooner.       Influenza A by PCR NEGATIVE NEGATIVE Final   Influenza B by PCR NEGATIVE NEGATIVE Final    Comment: (NOTE) The Xpert Xpress SARS-CoV-2/FLU/RSV plus assay is intended as an aid in the diagnosis of influenza from Nasopharyngeal swab specimens and should not be used as a sole basis for treatment. Nasal washings and aspirates are unacceptable for Xpert Xpress SARS-CoV-2/FLU/RSV testing.  Fact Sheet for Patients: BloggerCourse.com  Fact Sheet for Healthcare Providers: SeriousBroker.it  This test is not yet approved or cleared by the [place] FDA and has been authorized for detection and/or diagnosis of SARS-CoV-2 by FDA under an Emergency Use Authorization (EUA). This EUA will remain in effect (meaning this test can be used) for the duration of the COVID-19 declaration under Section 564(b)(1) of the Act, 21 U.S.C. section 360bbb-3(b)(1), unless the authorization is terminated or revoked.  Performed at [facility], [address] [place], [place] [address]   Blood culture (routine x 2)     Status: None   Collection Time: 03/19/21 10:24 PM   Specimen: BLOOD  Result Value Ref Range Status   Specimen Description   Final    BLOOD LEFT ANTECUBITAL Performed at [facility], [address], [place], [place] [address]    Special Requests   Final    BOTTLES DRAWN AEROBIC AND ANAEROBIC Blood Culture adequate volume Performed at [facility], [address] [place], [place] [address]    Culture   Final    NO GROWTH 5 DAYS Performed at [facility], [address], [place], [place] [address]    Report Status 03/25/2021 FINAL  Final  Blood culture (routine x 2)     Status: None   Collection Time: 03/19/21 10:24 PM    Specimen: BLOOD  Result Value Ref Range Status   Specimen Description   Final    BLOOD BLOOD LEFT HAND Performed at [facility], [address], [place], [place] [address]    Special Requests   Final    BOTTLES DRAWN AEROBIC AND ANAEROBIC Blood Culture results may not be optimal due to an inadequate volume of blood received in culture bottles Performed at  [facility], [address], [place], [place] [address]    Culture   Final    NO GROWTH 5 DAYS Performed at [facility], [address] [place], [place] [address]    Report Status 03/25/2021 FINAL  Final  Urine Culture     Status: Abnormal   Collection Time: 03/20/21  1:51 AM   Specimen: Urine, Clean Catch  Result Value Ref Range Status   Specimen Description   Final    URINE, CLEAN CATCH Performed at [facility], [address] [place], [place] [address]    Special Requests   Final    NONE Performed at [facility], [address], [place], [place] [address]    Culture MULTIPLE SPECIES PRESENT, SUGGEST RECOLLECTION (A)  Final   Report Status 03/21/2021 FINAL  Final  MRSA Next Gen by PCR, Nasal     Status: None   Collection Time: 03/22/21  8:12 AM   Specimen: Nasal Mucosa; Nasal Swab  Result Value Ref Range Status   MRSA by PCR Next Gen NOT DETECTED NOT DETECTED Final    Comment: (NOTE) The GeneXpert MRSA Assay (FDA approved for NASAL specimens only), is one component of a comprehensive MRSA colonization surveillance program. It is not intended to diagnose MRSA infection nor to guide or monitor treatment for MRSA infections. Test performance is not FDA approved in patients less than 57 years old. Performed at [facility], [address], [place], [place] [address]           Radiology Studies: DG Swallowing Func-Speech Pathology  Result Date: 03/25/2021 Table formatting from the original result was not  included. Objective Swallowing Evaluation: Type of Study: MBS-Modified Barium Swallow Study  Patient Details Name: Aldyn Toon MRN: [id number] Date of Birth: [birth year] Today's Date: 03/25/2021 Time: SLP Start Time (ACUTE ONLY): 0840 -SLP Stop Time (ACUTE ONLY): 0903 SLP Time Calculation (min) (ACUTE ONLY): 23 min Past Medical History: Past Medical History: Diagnosis Date  Hypertension  Past Surgical History: Past Surgical History: Procedure Laterality Date  CATARACT EXTRACTION, BILATERAL    KNEE SURGERY   HPI: 75 year old gentleman admitted with Acute metabolic encephalopathy in a patient with advanced dementia. 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 Pt with history of hypertension, dementia, dysphagia with PEG tube in place. Pt was brought to [facility] by his daughter last month from [place] where he was at an assisted living facility to be here at [facility].  He was admitted with multiple issues including dysphagia and aspiration pneumonia.  Discharge note indicates patient eating mechanical soft with honey thick liquids. Pt reportedly had 2 MBS while admitted. SLP notes not found, but radiology notes report silent aspriation of necter in one trial via straw, otherwise no aspiration.  Subjective: pt awke in chair Assessment / Plan / Recommendation CHL IP CLINICAL IMPRESSIONS 03/25/2021 Clinical Impression Patient presents with oral more than pharyngeal dysphagia c/b decreased oral coordination with premature spillage of barium into pharynx.  Solids accumulate to pyriform sinus region with swallow triggering at 8 seconds.  Aspiration of thin noted due to delay in swallow and barium mixing with secretions.  Pharyngeal swallow is strong but DELAYED and this delay, decreased ability to follow directions and with weak cough - raise pt's aspiration pna risk significantly.   Recommend pt have puree/nectar and allowed tsp of thin.  Soft solids from family when visiting ok but  institutionalized feeding still recommend dys1/nectar.  Oral suctioning advised before and after meals and strict dental brushing am and pm.   Follow up briefly for dysphagia management at SNF advised to determine appropriateness for advancement.  Skilled SLP included trials of compensations - chin tuck inadquately performed and cued cough/dry swallows not helpful. SLP Visit Diagnosis Dysphagia, oropharyngeal phase (R13.12) Attention and concentration deficit following -- Frontal lobe and executive function deficit following -- Impact on safety and function Moderate aspiration risk   CHL IP TREATMENT RECOMMENDATION 03/25/2021 Treatment Recommendations Therapy as outlined in treatment plan below   Prognosis 03/25/2021 Prognosis for Safe Diet Advancement Fair Barriers to Reach Goals Cognitive deficits;Severity of deficits Barriers/Prognosis Comment -- CHL IP DIET RECOMMENDATION 03/25/2021 SLP Diet Recommendations Nectar thick liquid;Thin liquid;Dysphagia 1 (Puree) solids Liquid Administration via Cup;Straw Medication Administration Via alternative means Compensations Slow rate;Small sips/bites Postural Changes Remain semi-upright after after feeds/meals (Comment);Seated upright at 90 degrees   CHL IP OTHER RECOMMENDATIONS 03/25/2021 Recommended Consults -- Oral Care Recommendations Oral care BID Other Recommendations --   CHL IP FOLLOW UP RECOMMENDATIONS 03/25/2021 Follow up Recommendations Skilled Nursing facility   [facility] IP FREQUENCY AND DURATION 03/25/2021 Speech Therapy Frequency (ACUTE ONLY) min 2x/week Treatment Duration 2 weeks      CHL IP ORAL PHASE 03/25/2021 Oral Phase Impaired Oral - Pudding Teaspoon -- Oral - Pudding Cup -- Oral - Honey Teaspoon -- Oral - Honey Cup -- Oral - Nectar Teaspoon Decreased bolus cohesion Oral - Nectar Cup Decreased bolus cohesion;Premature spillage Oral - Nectar Straw Decreased bolus cohesion;Premature spillage Oral - Thin Teaspoon Decreased bolus cohesion;Premature spillage Oral - Thin Cup  Decreased bolus cohesion;Lingual/palatal residue;Premature spillage Oral - Thin Straw Decreased bolus cohesion;Lingual/palatal residue;Premature spillage Oral - Puree Decreased bolus cohesion;Premature spillage Oral - Mech Soft Decreased bolus cohesion;Lingual/palatal residue;Premature spillage;Weak lingual manipulation;Piecemeal swallowing;Other (Comment) Oral - Regular -- Oral - Multi-Consistency -- Oral - Pill -- Oral Phase - Comment --  CHL IP PHARYNGEAL PHASE 03/25/2021 Pharyngeal Phase Impaired Pharyngeal- Pudding Teaspoon -- Pharyngeal -- Pharyngeal- Pudding Cup -- Pharyngeal -- Pharyngeal- Honey Teaspoon -- Pharyngeal -- Pharyngeal- Honey Cup -- Pharyngeal -- Pharyngeal- Nectar Teaspoon Delayed swallow initiation-pyriform sinuses;Delayed swallow initiation-vallecula Pharyngeal Material does not enter airway Pharyngeal- Nectar Cup Delayed swallow initiation-pyriform sinuses;Delayed swallow initiation-vallecula Pharyngeal Material enters airway, CONTACTS cords and not ejected out Pharyngeal- Nectar Straw Delayed swallow initiation-pyriform sinuses;Delayed swallow initiation-vallecula;Penetration/Aspiration during swallow Pharyngeal Material enters airway, CONTACTS cords and not ejected out Pharyngeal- Thin Teaspoon Delayed swallow initiation-pyriform sinuses;Penetration/Aspiration during swallow Pharyngeal Material enters airway, passes BELOW cords without attempt by patient to eject out (silent aspiration) Pharyngeal- Thin Cup Delayed swallow initiation-vallecula;Delayed swallow initiation-pyriform sinuses;Penetration/Aspiration during swallow Pharyngeal Material enters airway, passes BELOW cords without attempt by patient to eject out (silent aspiration) Pharyngeal- Thin Straw Delayed swallow initiation-pyriform sinuses Pharyngeal Material enters airway, passes BELOW cords without attempt by patient to eject out (silent aspiration) Pharyngeal- Puree Delayed swallow initiation-pyriform sinuses Pharyngeal  Material does not enter airway Pharyngeal- Mechanical Soft Delayed swallow initiation-pyriform sinuses Pharyngeal Material does not enter airway Pharyngeal- Regular -- Pharyngeal -- Pharyngeal- Multi-consistency -- Pharyngeal -- Pharyngeal- Pill -- Pharyngeal -- Pharyngeal Comment minimal pharyngeal retention mixed with secretions - with aspiration - silent in nature-, pt did not cough on comman nor to aspiration - cough noted x3 during MBS - without barium visualized in proximal trachea or larynx at that time - ? delayed response to aspiration?  CHL IP CERVICAL ESOPHAGEAL PHASE 03/25/2021 Cervical Esophageal Phase Impaired Pudding Teaspoon -- Pudding Cup -- Honey Teaspoon -- Honey Cup --  Nectar Teaspoon -- Nectar Cup -- Nectar Straw -- Thin Teaspoon -- Thin Cup -- Thin Straw -- Puree -- Mechanical Soft -- Regular -- Multi-consistency -- Pill -- Cervical Esophageal Comment -- Rolena Infante, MS [facility] SLP Acute Rehab Services Office [phone] Pager [phone] Chales Abrahams 03/25/2021, 9:43 AM                   Scheduled Meds:  chlorhexidine  15 mL Mouth Rinse BID   Chlorhexidine Gluconate Cloth  6 each Topical Daily   famotidine  20 mg Per Tube QHS   feeding supplement (PROSource TF)  45 mL Per Tube Daily   free water  150 mL Per Tube Q4H   heparin  5,000 Units Subcutaneous Q8H   mouth rinse  15 mL Mouth Rinse q12n4p   melatonin  6 mg Per Tube QHS   polyethylene glycol  17 g Per Tube BID   QUEtiapine  25 mg Per Tube q morning   QUEtiapine  50 mg Per Tube QHS   Continuous Infusions:  dextrose 50 mL/hr at 03/26/21 1149   feeding supplement (OSMOLITE 1.5 CAL) 60 mL/hr at 03/25/21 0958     LOS: 6 days       Kathlen Mody, MD [facility]   To contact the attending provider between 7A-7P or the covering provider during after hours 7P-7A, please log into the web site [email or web address] and access using universal [place] password for that web site. If you do not have the password, please  call the hospital operator.  03/26/2021, 12:49 PM

## 2021-03-27 LAB — GLUCOSE, CAPILLARY
Glucose-Capillary: 102 mg/dL — ABNORMAL HIGH (ref 70–99)
Glucose-Capillary: 113 mg/dL — ABNORMAL HIGH (ref 70–99)
Glucose-Capillary: 60 mg/dL — ABNORMAL LOW (ref 70–99)
Glucose-Capillary: 61 mg/dL — ABNORMAL LOW (ref 70–99)
Glucose-Capillary: 61 mg/dL — ABNORMAL LOW (ref 70–99)
Glucose-Capillary: 72 mg/dL (ref 70–99)
Glucose-Capillary: 75 mg/dL (ref 70–99)
Glucose-Capillary: 87 mg/dL (ref 70–99)
Glucose-Capillary: 98 mg/dL (ref 70–99)

## 2021-03-27 LAB — CBC WITH DIFFERENTIAL/PLATELET
Abs Immature Granulocytes: 0 10*3/uL (ref 0.00–0.07)
Basophils Absolute: 0 10*3/uL (ref 0.0–0.1)
Basophils Relative: 0 %
Eosinophils Absolute: 0.3 10*3/uL (ref 0.0–0.5)
Eosinophils Relative: 5 %
HCT: 29.5 % — ABNORMAL LOW (ref 39.0–52.0)
Hemoglobin: 9.8 g/dL — ABNORMAL LOW (ref 13.0–17.0)
Immature Granulocytes: 0 %
Lymphocytes Relative: 31 %
Lymphs Abs: 1.7 10*3/uL (ref 0.7–4.0)
MCH: 29.8 pg (ref 26.0–34.0)
MCHC: 33.2 g/dL (ref 30.0–36.0)
MCV: 89.7 fL (ref 80.0–100.0)
Monocytes Absolute: 0.4 10*3/uL (ref 0.1–1.0)
Monocytes Relative: 8 %
Neutro Abs: 3 10*3/uL (ref 1.7–7.7)
Neutrophils Relative %: 56 %
Platelets: 162 10*3/uL (ref 150–400)
RBC: 3.29 MIL/uL — ABNORMAL LOW (ref 4.22–5.81)
RDW: 13.1 % (ref 11.5–15.5)
WBC: 5.4 10*3/uL (ref 4.0–10.5)
nRBC: 0 % (ref 0.0–0.2)

## 2021-03-27 MED ORDER — DEXTROSE 50 % IV SOLN
INTRAVENOUS | Status: AC
  Filled 2021-03-27: qty 50

## 2021-03-27 MED ORDER — DEXTROSE 50 % IV SOLN
12.5000 g | INTRAVENOUS | Status: AC
  Administered 2021-03-27: 12.5 g via INTRAVENOUS
  Filled 2021-03-27: qty 50

## 2021-03-27 NOTE — Progress Notes (Signed)
PROGRESS NOTE    Jack DikesJohn Rivas  ZOX:[id number] DOB: 05/14/1946 DOA: 03/19/2021 PCP: Patient, No Pcp Per (Inactive)    Chief Complaint  Patient presents with   Aspiration    Brief Narrative:  75 year old gentleman prior history of hypertension, dementia, dysphagia s/p PEG placement brought to ED with altered mental status patient is mostly nonverbal.  As per the family patient had extensive hospitalization for 6 months due to respiratory failure s/p trach and PEG placement and was discharged to a nursing home facility earlier this year.  In view of his chronic dysphagia and aspiration pneumonia patient had been on pured diet he was also recently [facility] similar [facility] and discharged to SNF.  Prior to this admission patient has been on pured diet with episodes of choking on food.  Patient's CT of the chest showed possible right lower lobe filtrates suspicious for aspiration pneumonia and his hospital course was complicated by significant hypoglycemia. Hypoglycemia episodes have improved with IV Solu-Cortef. Pt seen and examined at bedside, off restraints since yesterday. D/c mittens today.    Assessment & Plan:   Active Problems:   Acute metabolic encephalopathy   Aspiration pneumonia (HCC)   Dementia (HCC)   Dysphagia   Malnutrition of moderate degree   Acute metabolic encephalopathy in the setting of advanced dementia Possibly secondary to aspiration pneumonia which also might be contributing to hypothermia and hypoglycemia. Initial CT of the head is negative for acute findings. MRI of the brain was limited due to motion but did not show any acute stroke. Patient was started empirically on IV Rocephin and Zithromax, completed the course of antibiotics.  Urine for strep antigen and Legionella antigen is negative. Patient is off restraints.  Patient is very high risk for recurrent aspiration. SLP on board and will continue to follow.  Modified barium swallow  done and recommended dysphagia 1 diet with nectar thick liquids Pt is alert and comfortable.     Hypothermia/hypoglycemia Probably secondary to aspiration pneumonia Of note patient has borderline cortisol levels.  IV Solu-Cortef was added with improvement in CBGs. Start weaning his solu cortef. C peptide level is wnl.  Discontinued Solu-Cortef and dextrose fluids. Pending sulfonylurea and proinsulin levels which have been ordered  continue with Prosource 45 mL/h    Essential hypertension Blood pressure parameters are well controlled today.     Acute kidney injury Baseline creatinine appears to be around 1.8-1.9. Creatinine appears to be better than baseline. Creatinine at 1.6 today    Intermittent agitation Probably secondary to hospital-acquired delirium in the setting of history of dementia. Also on Seroquel 25 mg in the morning and 50 mg at night. Patient is on Haldol as needed. Taken off mittens and restraints.  Continue with melatonin 6 mg at bedtime   Pt is medically stable for discharge.     DVT prophylaxis: Heparin Code Status: Full code Family Communication: Discussed with daughter at bedside  disposition:   Status is: Inpatient  Remains inpatient appropriate because:IV treatments appropriate due to intensity of illness or inability to take PO  Dispo: The patient is from: SNF              Anticipated d/c is to: SNF              Patient currently is medically stable to d/c.   Difficult to place patient No       Consultants:  None Procedures: (None Antimicrobials:  Antibiotics Given (last 72 hours)  None         Subjective: Patient has no new complaints  Objective: Vitals:   03/26/21 1749 03/26/21 1958 03/27/21 0020 03/27/21 0418  BP: (!) 151/44 (!) 114/52 (!) 125/57 (!) 175/61  Pulse: 66 72 69 62  Resp: 16 17 18 19   Temp: (!) 97.4 F (36.3 C) 97.8 F (36.6 C) 97.9 F (36.6 C) 98.7 F (37.1 C)  TempSrc: Oral Oral Oral    SpO2: 97% 99% 98% 100%  Weight:      Height:        Intake/Output Summary (Last 24 hours) at 03/27/2021 1256 Last data filed at 03/27/2021 0845 Gross per 24 hour  Intake 1860 ml  Output 3501 ml  Net -1641 ml    Filed Weights   03/24/21 0500 03/24/21 2009 03/25/21 0614  Weight: 89.8 kg 93.4 kg 93.4 kg    Examination:  General exam: Appears calm and comfortable  Respiratory system: Clear to auscultation. Respiratory effort normal. Cardiovascular system: S1 & S2 heard, RRR. No JVD, murmurs, No pedal edema. Gastrointestinal system: Abdomen is abdomen, soft, Non tender, Non distended, Bowel sounds wnl Central nervous system: Alert and comfortable.  Extremities: Symmetric 5 x 5 power. Skin: No rashes, lesions or ulcers Psychiatry: Mood is appropriate.      Data Reviewed: I have personally reviewed following labs and imaging studies  CBC: Recent Labs  Lab 03/23/21 0248 03/24/21 0239 03/25/21 0516 03/26/21 0659 03/27/21 0603  WBC 5.1 4.2 4.6 4.3 5.4  NEUTROABS 3.2 2.3 2.2 2.6 3.0  HGB 9.4* 8.7* 9.6* 10.3* 9.8*  HCT 28.9* 26.7* 29.5* 31.3* 29.5*  MCV 90.0 90.2 89.9 91.0 89.7  PLT 142* 147* 148* 157 162     Basic Metabolic Panel: Recent Labs  Lab 03/21/21 0420 03/22/21 0236 03/22/21 0933 03/23/21 0248 03/24/21 0850 03/26/21 0659  NA 139 139  --  135 137 139  K 4.5 4.9  --  5.1 5.1 5.0  CL 108 107  --  103 101 101  CO2 25 25  --  25 26 30   GLUCOSE 121* 87 79 135* 91 103*  BUN 41* 37*  --  36* 40* 40*  CREATININE 1.74* 1.66*  --  1.66* 1.77* 1.61*  CALCIUM 9.1 8.9  --  8.8* 8.7* 9.3  MG 1.9  --   --  1.7  --   --   PHOS 4.6  --   --  4.3  --   --      GFR: Estimated Creatinine Clearance: 47.4 mL/min (A) (by C-G formula based on SCr of 1.61 mg/dL (H)).  Liver Function Tests: No results for input(s): AST, ALT, ALKPHOS, BILITOT, PROT, ALBUMIN in the last 168 hours.   CBG: Recent Labs  Lab 03/27/21 0349 03/27/21 0433 03/27/21 0726 03/27/21 0838  03/27/21 0840  GLUCAP 60* 75 61* 61* 72      Recent Results (from the past 240 hour(s))  Resp Panel by RT-PCR (Flu A&B, Covid) Nasopharyngeal Swab     Status: None   Collection Time: 03/19/21 10:09 PM   Specimen: Nasopharyngeal Swab; Nasopharyngeal(NP) swabs in vial transport medium  Result Value Ref Range Status   SARS Coronavirus 2 by RT PCR NEGATIVE NEGATIVE Final    Comment: (NOTE) SARS-CoV-2 target nucleic acids are NOT DETECTED.  The SARS-CoV-2 RNA is generally detectable in upper respiratory specimens during the acute phase of infection. The lowest concentration of SARS-CoV-2 viral copies this assay can detect is 138 copies/mL. A negative result does not  preclude SARS-Cov-2 infection and should not be used as the sole basis for treatment or other patient management decisions. A negative result may occur with  improper specimen collection/handling, submission of specimen other than nasopharyngeal swab, presence of viral mutation(s) within the areas targeted by this assay, and inadequate number of viral copies(<138 copies/mL). A negative result must be combined with clinical observations, patient history, and epidemiological information. The expected result is Negative.  Fact Sheet for Patients:  BloggerCourse.com  Fact Sheet for Healthcare Providers:  SeriousBroker.it  This test is no t yet approved or cleared by the [place] FDA and  has been authorized for detection and/or diagnosis of SARS-CoV-2 by FDA under an Emergency Use Authorization (EUA). This EUA will remain  in effect (meaning this test can be used) for the duration of the COVID-19 declaration under Section 564(b)(1) of the Act, 21 U.S.C.section 360bbb-3(b)(1), unless the authorization is terminated  or revoked sooner.       Influenza A by PCR NEGATIVE NEGATIVE Final   Influenza B by PCR NEGATIVE NEGATIVE Final    Comment: (NOTE) The Xpert Xpress  SARS-CoV-2/FLU/RSV plus assay is intended as an aid in the diagnosis of influenza from Nasopharyngeal swab specimens and should not be used as a sole basis for treatment. Nasal washings and aspirates are unacceptable for Xpert Xpress SARS-CoV-2/FLU/RSV testing.  Fact Sheet for Patients: BloggerCourse.com  Fact Sheet for Healthcare Providers: SeriousBroker.it  This test is not yet approved or cleared by the [place] FDA and has been authorized for detection and/or diagnosis of SARS-CoV-2 by FDA under an Emergency Use Authorization (EUA). This EUA will remain in effect (meaning this test can be used) for the duration of the COVID-19 declaration under Section 564(b)(1) of the Act, 21 U.S.C. section 360bbb-3(b)(1), unless the authorization is terminated or revoked.  Performed at [facility], [address], [place], [place] [address]   Blood culture (routine x 2)     Status: None   Collection Time: 03/19/21 10:24 PM   Specimen: BLOOD  Result Value Ref Range Status   Specimen Description   Final    BLOOD LEFT ANTECUBITAL Performed at [facility], [address], [place], [place] [address]    Special Requests   Final    BOTTLES DRAWN AEROBIC AND ANAEROBIC Blood Culture adequate volume Performed at [facility], [address] [place], [place] [address]    Culture   Final    NO GROWTH 5 DAYS Performed at [facility], [address], [place], [place] [address]    Report Status 03/25/2021 FINAL  Final  Blood culture (routine x 2)     Status: None   Collection Time: 03/19/21 10:24 PM   Specimen: BLOOD  Result Value Ref Range Status   Specimen Description   Final    BLOOD BLOOD LEFT HAND Performed at [facility], [address], [place], [place] [address]    Special Requests   Final    BOTTLES DRAWN AEROBIC AND ANAEROBIC Blood Culture  results may not be optimal due to an inadequate volume of blood received in culture bottles Performed at [facility], [address] [place], [place] [address]    Culture   Final    NO GROWTH 5 DAYS Performed at [facility], [address] [place], [place] [address]    Report Status 03/25/2021 FINAL  Final  Urine Culture     Status: Abnormal  Collection Time: 03/20/21  1:51 AM   Specimen: Urine, Clean Catch  Result Value Ref Range Status   Specimen Description   Final    URINE, CLEAN CATCH Performed at [facility], [address], [place], [place] [address]    Special Requests   Final    NONE Performed at [facility], [address], [place], [place] [address]    Culture MULTIPLE SPECIES PRESENT, SUGGEST RECOLLECTION (A)  Final   Report Status 03/21/2021 FINAL  Final  MRSA Next Gen by PCR, Nasal     Status: None   Collection Time: 03/22/21  8:12 AM   Specimen: Nasal Mucosa; Nasal Swab  Result Value Ref Range Status   MRSA by PCR Next Gen NOT DETECTED NOT DETECTED Final    Comment: (NOTE) The GeneXpert MRSA Assay (FDA approved for NASAL specimens only), is one component of a comprehensive MRSA colonization surveillance program. It is not intended to diagnose MRSA infection nor to guide or monitor treatment for MRSA infections. Test performance is not FDA approved in patients less than 47 years old. Performed at [facility], [address], [place], [place] [address]           Radiology Studies: No results found.      Scheduled Meds:  chlorhexidine  15 mL Mouth Rinse BID   Chlorhexidine Gluconate Cloth  6 each Topical Daily   famotidine  20 mg Per Tube QHS   feeding supplement (PROSource TF)  45 mL Per Tube Daily   free water  150 mL Per Tube Q4H   heparin  5,000 Units Subcutaneous Q8H   mouth rinse  15 mL Mouth Rinse q12n4p   melatonin  6 mg Per Tube QHS   polyethylene  glycol  17 g Per Tube BID   QUEtiapine  25 mg Per Tube q morning   QUEtiapine  50 mg Per Tube QHS   Continuous Infusions:  feeding supplement (OSMOLITE 1.5 CAL) 1,000 mL (03/27/21 0320)     LOS: 7 days       Kathlen Mody, MD [facility]   To contact the attending provider between 7A-7P or the covering provider during after hours 7P-7A, please log into the web site [email or web address] and access using universal [place] password for that web site. If you do not have the password, please call the hospital operator.  03/27/2021, 12:56 PM

## 2021-03-28 LAB — CBC WITH DIFFERENTIAL/PLATELET
Abs Immature Granulocytes: 0.01 10*3/uL (ref 0.00–0.07)
Basophils Absolute: 0 10*3/uL (ref 0.0–0.1)
Basophils Relative: 0 %
Eosinophils Absolute: 0.3 10*3/uL (ref 0.0–0.5)
Eosinophils Relative: 5 %
HCT: 30 % — ABNORMAL LOW (ref 39.0–52.0)
Hemoglobin: 9.6 g/dL — ABNORMAL LOW (ref 13.0–17.0)
Immature Granulocytes: 0 %
Lymphocytes Relative: 43 %
Lymphs Abs: 2 10*3/uL (ref 0.7–4.0)
MCH: 29.3 pg (ref 26.0–34.0)
MCHC: 32 g/dL (ref 30.0–36.0)
MCV: 91.5 fL (ref 80.0–100.0)
Monocytes Absolute: 0.5 10*3/uL (ref 0.1–1.0)
Monocytes Relative: 11 %
Neutro Abs: 2 10*3/uL (ref 1.7–7.7)
Neutrophils Relative %: 41 %
Platelets: 157 10*3/uL (ref 150–400)
RBC: 3.28 MIL/uL — ABNORMAL LOW (ref 4.22–5.81)
RDW: 13.3 % (ref 11.5–15.5)
WBC: 4.8 10*3/uL (ref 4.0–10.5)
nRBC: 0 % (ref 0.0–0.2)

## 2021-03-28 LAB — GLUCOSE, CAPILLARY
Glucose-Capillary: 113 mg/dL — ABNORMAL HIGH (ref 70–99)
Glucose-Capillary: 88 mg/dL (ref 70–99)

## 2021-03-28 LAB — SARS CORONAVIRUS 2 (TAT 6-24 HRS): SARS Coronavirus 2: NEGATIVE

## 2021-03-28 MED ORDER — FREE WATER: 150.0000 mL | Status: DC

## 2021-03-28 MED ORDER — FOOD THICKENER (SIMPLYTHICK): 1.0000 | ORAL | Status: DC | PRN

## 2021-03-28 MED ORDER — OSMOLITE 1.5 CAL PO LIQD: 1000.0000 mL | ORAL | 0 refills | Status: DC

## 2021-03-28 MED ORDER — QUETIAPINE FUMARATE 25 MG PO TABS: 25.0000 mg | ORAL_TABLET | Freq: Every morning | ORAL | Status: DC

## 2021-03-28 MED ORDER — QUETIAPINE FUMARATE 50 MG PO TABS: 50.0000 mg | ORAL_TABLET | Freq: Every day | ORAL | Status: DC

## 2021-03-28 MED ORDER — PROSOURCE TF PO LIQD: 45.0000 mL | Freq: Every day | ORAL | Status: DC

## 2021-03-28 NOTE — Care Management Important Message (Signed)
Important Message  Patient Details IM Letter given to the Patient. Name: Jack Rivas MRN: [id number] Date of Birth: August 09, 1945   Medicare Important Message Given:  Yes     Caren Macadam 03/28/2021, 11:46 AM

## 2021-03-28 NOTE — TOC Transition Note (Signed)
Transition of Care [facility]) - CM/SW Discharge Note   Patient Details  Name: Jack Rivas MRN: [id number] Date of Birth: 1946-06-02  [facility] CM/SW Contact:  Bartholome Bill, RN Phone Number: 03/28/2021, 3:36 PM   Clinical Narrative:    Pt to dc to [facility] today. Pt to be transported via [facility]. Daughter [name] called to alert of transfer. RN to call report to [phone].    Final next level of care: Skilled Nursing Facility Barriers to Discharge: No Barriers Identified   Patient Goals and CMS Choice Patient states their goals for this hospitalization and ongoing recovery are:: Go to rehab CMS Medicare.gov Compare Post Acute Care list provided to:: Patient Represenative (must comment) Choice offered to / list presented to : Adult Children  Discharge Placement              Patient [facility] bed at: [facility] Patient to be transferred to facility by: [facility] Name of family member notified: [name] Patient and family notified of of transfer: 03/28/21  Discharge Plan and Services In-house Referral: Clinical Social Work   Post Acute Care Choice: Skilled Nursing Facility          DME Arranged: N/A DME Agency: NA                  Social Determinants of Health (SDOH) Interventions     Readmission Risk Interventions Readmission Risk Prevention Plan 03/28/2021  Transportation Screening Complete  PCP or Specialist Appt within 3-5 Days Complete  HRI or Home Care Consult Complete  Social Work Consult for Recovery Care Planning/Counseling Complete  Palliative Care Screening Not Applicable  Medication Review [employer]) Complete

## 2021-03-28 NOTE — Progress Notes (Signed)
Report called to [facility], spoke with [place]. AVS was sent with [facility]. Explained  to [place] that the patient was a hIGH risk for aspiration. Pt slept most of the day. Has a good appetite, needs to be feed. He was transported via [employer]

## 2021-03-28 NOTE — Discharge Summary (Signed)
Physician Discharge Summary  Jack Rivas WUJ:[id number] DOB: 1945-10-06 DOA: 03/19/2021  PCP: Patient, No Pcp Per (Inactive)  Admit date: 03/19/2021 Discharge date: 03/28/2021  Admitted From: SNF Disposition:  SNF  Recommendations for Outpatient Follow-up:  Follow up with PCP in 1-2 weeks Please obtain BMP/CBC in one week   Discharge Condition:STABLE CODE STATUS:full code.  Diet recommendation:   Brief/Interim Summary: 75 year old gentleman prior history of hypertension, dementia, dysphagia s/p PEG placement brought to ED with altered mental status patient is mostly nonverbal.  As per the family patient had extensive hospitalization for 6 months due to respiratory failure s/p trach and PEG placement and was discharged to a nursing home facility earlier this year.  In view of his chronic dysphagia and aspiration pneumonia patient had been on pured diet he was also recently admitted to [facility] for similar complaints and discharged to SNF.  Prior to this admission patient has been on pured diet with episodes of choking on food.  Patient's CT of the chest showed possible right lower lobe filtrates suspicious for aspiration pneumonia and his hospital course was complicated by significant hypoglycemia. Hypoglycemia episodes have improved with IV Solu-Cortef.  Discharge Diagnoses:  Active Problems:   Acute metabolic encephalopathy   Aspiration pneumonia (HCC)   Dementia (HCC)   Dysphagia   Malnutrition of moderate degree  Acute metabolic encephalopathy in the setting of advanced dementia Possibly secondary to aspiration pneumonia which also might be contributing to hypothermia and hypoglycemia. Initial CT of the head is negative for acute findings. MRI of the brain was limited due to motion but did not show any acute stroke. Patient was started empirically on IV Rocephin and Zithromax, completed the course of antibiotics.  Urine for strep antigen and Legionella antigen is  negative. Patient is off restraints.  Patient is very high risk for recurrent aspiration. SLP on board and will continue to follow.  Modified barium swallow done and recommended dysphagia 1 diet with nectar thick liquids Pt is alert and comfortable.        Hypothermia/hypoglycemia Probably secondary to aspiration pneumonia Of note patient has borderline cortisol levels.  IV Solu-Cortef was added with improvement in CBGs. Start weaning his solu cortef. C peptide level is wnl.  Discontinued Solu-Cortef and dextrose fluids. Pending sulfonylurea and proinsulin levels which have been ordered  continue with Prosource 45 mL/h       Essential hypertension Blood pressure parameters are well controlled today.        Acute kidney injury Baseline creatinine appears to be around 1.8-1.9. Creatinine appears to be better than baseline. Creatinine at 1.6 today       Intermittent agitation Probably secondary to hospital-acquired delirium in the setting of history of dementia. Also on Seroquel 25 mg in the morning and 50 mg at night. Patient is on Haldol as needed. Taken off mittens and restraints.  Continue with melatonin 6 mg at bedtime     Pt is medically stable for discharge.       Discharge Instructions  Discharge Instructions     Diet - low sodium heart healthy   Complete by: As directed    Discharge instructions   Complete by: As directed    Please follow up with PCP in one week.      Allergies as of 03/28/2021   No Known Allergies      Medication List     STOP taking these medications    amLODipine 10 MG tablet Commonly known as: NORVASC   losartan  50 MG tablet Commonly known as: COZAAR   predniSONE 20 MG tablet Commonly known as: DELTASONE   sodium zirconium cyclosilicate 10 g Pack packet Commonly known as: LOKELMA       TAKE these medications    acetaminophen 325 MG tablet Commonly known as: TYLENOL Take 650 mg by mouth every 6 (six) hours as  needed for pain. What changed: Another medication with the same name was removed. Continue taking this medication, and follow the directions you see here.   diclofenac Sodium 1 % Gel Commonly known as: VOLTAREN Apply 1 application topically 2 (two) times daily. Apply to right knee   famotidine 20 MG tablet Commonly known as: PEPCID Take 20 mg by mouth at bedtime.   feeding supplement (OSMOLITE 1.5 CAL) Liqd Place 1,000 mLs into feeding tube continuous.   feeding supplement (PROSource TF) liquid Place 45 mLs into feeding tube daily.   food thickener Gel Commonly known as: SIMPLYTHICK (NECTAR/LEVEL 2/MILDLY THICK) Take 1 packet by mouth as needed.   free water Soln Place 150 mLs into feeding tube every 4 (four) hours.   melatonin 3 MG Tabs tablet Take 6 mg by mouth at bedtime.   polyethylene glycol powder 17 GM/SCOOP powder Commonly known as: GLYCOLAX/MIRALAX Take 17 g by mouth daily. Mix with 8oz of fluid   QUEtiapine 50 MG tablet Commonly known as: SEROQUEL Place 1 tablet (50 mg total) into feeding tube at bedtime.   QUEtiapine 25 MG tablet Commonly known as: SEROQUEL Place 1 tablet (25 mg total) into feeding tube every morning.        Contact information for after-discharge care     Destination     [facility] Preferred SNF .   Service: Skilled Nursing Contact information: [address] [place] [address] [phone]                    No Known Allergies  Consultations: None.    Procedures/Studies: CT HEAD WO CONTRAST ( )  Result Date: 03/20/2021 CLINICAL DATA:  Encephalopathy EXAM: CT HEAD WITHOUT CONTRAST TECHNIQUE: Contiguous axial images were obtained from the base of the skull through the vertex without intravenous contrast. COMPARISON:  None. FINDINGS: Brain: Motion degraded study. 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 Generalized volume loss is mild. No visible hemorrhage or extra-axial  collection. Vascular: No abnormal hyperdensity of the major intracranial arteries or dural venous sinuses. No intracranial atherosclerosis. Skull: The visualized skull base, calvarium and extracranial soft tissues are normal. Sinuses/Orbits: Right mastoid effusion. Mild mucosal thickening of the right sphenoid and maxillary sinuses. Small amount of fluid in the right maxillary sinus. The orbits are normal. IMPRESSION: 1. Motion degraded study. No acute intracranial abnormality. Electronically Signed   By: Deatra Robinson M.D.   On: 03/20/2021 00:57   CT Chest W Contrast  Result Date: 03/20/2021 CLINICAL DATA:  Cough, possible aspiration.  Abdominal pain. EXAM: CT CHEST, ABDOMEN, AND PELVIS WITH CONTRAST TECHNIQUE: Multidetector CT imaging of the chest, abdomen and pelvis was performed following the standard protocol during bolus administration of intravenous contrast. CONTRAST:  70mL OMNIPAQUE IOHEXOL 350 MG/ML SOLN COMPARISON:  None. FINDINGS: CT CHEST FINDINGS Cardiovascular: Mild cardiomegaly. Aortic calcifications. No aneurysm. Mediastinum/Nodes: No mediastinal, hilar, or axillary adenopathy. Trachea and esophagus are unremarkable. Thyroid unremarkable. Lungs/Pleura: Mild ground-glass opacities in the right lower lobe. No confluent opacities on the left. No effusions or pneumothorax. Musculoskeletal: No acute bony abnormality. CT ABDOMEN PELVIS FINDINGS Hepatobiliary: No focal hepatic abnormality. Gallbladder unremarkable. Pancreas:  No focal abnormality or ductal dilatation. Spleen: No focal abnormality.  Normal size. Adrenals/Urinary Tract: No adrenal abnormality. No focal renal abnormality. No stones or hydronephrosis. Urinary bladder is unremarkable. Stomach/Bowel: Large stool burden throughout the colon. Diffuse colonic diverticulosis. No active diverticulitis. No bowel obstruction. Gastrostomy tube within the stomach. Vascular/Lymphatic: Aortic atherosclerosis. Reproductive: Mildly prominent prostate.  Other: No free fluid or free air. Musculoskeletal: Degenerative changes in the lumbar spine. No acute bony abnormality. IMPRESSION: Cardiomegaly.  Aortic atherosclerosis. Mild ground-glass opacities in the right lower lobe could reflect atelectasis or early infiltrate. No effusions. Colonic diverticulosis. Moderate stool burden throughout the colon. No acute findings in the abdomen or pelvis. Electronically Signed   By: Charlett Nose M.D.   On: 03/20/2021 00:48   MR BRAIN WO CONTRAST  Result Date: 03/20/2021 CLINICAL DATA:  Mental status change, persistent or worsening. EXAM: MRI HEAD WITHOUT CONTRAST TECHNIQUE: Multiplanar, multiecho pulse sequences of the brain and surrounding structures were obtained without intravenous contrast. COMPARISON:  Head CT performed earlier today 03/20/2021. FINDINGS: Brain: The patient was unable to tolerate the full examination. As a result, a coronal diffusion-weighted sequence and a coronal T2 TSE sequence could not be obtained. Multiple acquired sequences are significantly motion degraded, limiting evaluation. Most notably, there is moderate motion degradation of the axial T2 TSE sequence, moderate/severe motion degradation of the axial SWI sequence, severe motion degradation of the axial T2/FLAIR sequence and severe motion degradation of the axial T1 weighted sequence. Mild generalized cerebral atrophy. The diffusion-weighted imaging is of good quality. No evidence of acute infarction Motion degradation significantly limits evaluation for parenchymal signal abnormality. Within the limitations of motion degradation, no intracranial mass, chronic intracranial blood products or extra-axial fluid collection is identified. No midline shift Vascular: Maintained flow voids within the proximal large arterial vessels. Skull and upper cervical spine: No focal suspicious marrow lesion. 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 Sinuses/Orbits:  Visualized orbits show no acute finding. Bilateral lens replacements. Chronic medially displaced fracture deformity of the right lamina papyracea. Mild-to-moderate mucosal thickening within the bilateral maxillary and right sphenoid sinuses. Mild mucosal thickening within the left sphenoid sinus. Other: Large right middle ear/mastoid effusion. Small left mastoid effusion. IMPRESSION: Prematurely terminated, significantly motion degraded and limited examination as described. The diffusion-weighted imaging is of good quality. No evidence of acute infarction. Within the limitations of motion degradation, no acute intracranial abnormality is identified. Mild generalized cerebral atrophy. Paranasal sinus disease, as described. Large right middle ear/mastoid effusion. A small left mastoid effusion is also present. 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 Electronically Signed   By: Jackey Loge D.O.   On: 03/20/2021 17:35   CT ABDOMEN PELVIS W CONTRAST  Result Date: 03/20/2021 CLINICAL DATA:  Cough, possible aspiration.  Abdominal pain. EXAM: CT CHEST, ABDOMEN, AND PELVIS WITH CONTRAST TECHNIQUE: Multidetector CT imaging of the chest, abdomen and pelvis was performed following the standard protocol during bolus administration of intravenous contrast. CONTRAST:  44mL OMNIPAQUE IOHEXOL 350 MG/ML SOLN COMPARISON:  None. FINDINGS: CT CHEST FINDINGS Cardiovascular: Mild cardiomegaly. Aortic calcifications. No aneurysm. Mediastinum/Nodes: No mediastinal, hilar, or axillary adenopathy. Trachea and esophagus are unremarkable. Thyroid unremarkable. Lungs/Pleura: Mild ground-glass opacities in the right lower lobe. No confluent opacities on the left. No effusions or pneumothorax. Musculoskeletal: No acute bony abnormality. CT ABDOMEN PELVIS FINDINGS Hepatobiliary: No focal hepatic abnormality. Gallbladder unremarkable. Pancreas: No focal abnormality or ductal dilatation. Spleen: No focal  abnormality.  Normal size. Adrenals/Urinary Tract: No adrenal abnormality. No  focal renal abnormality. No stones or hydronephrosis. Urinary bladder is unremarkable. Stomach/Bowel: Large stool burden throughout the colon. Diffuse colonic diverticulosis. No active diverticulitis. No bowel obstruction. Gastrostomy tube within the stomach. Vascular/Lymphatic: Aortic atherosclerosis. Reproductive: Mildly prominent prostate. Other: No free fluid or free air. Musculoskeletal: Degenerative changes in the lumbar spine. No acute bony abnormality. IMPRESSION: Cardiomegaly.  Aortic atherosclerosis. Mild ground-glass opacities in the right lower lobe could reflect atelectasis or early infiltrate. No effusions. Colonic diverticulosis. Moderate stool burden throughout the colon. No acute findings in the abdomen or pelvis. Electronically Signed   By: Charlett NoseKevin  Dover M.D.   On: 03/20/2021 00:48   DG Chest Portable 1 View  Result Date: 03/19/2021 CLINICAL DATA:  Concern for aspiration. Possible altered mental status. Abdominal pain. Lethargy. Cough. EXAM: PORTABLE CHEST 1 VIEW COMPARISON:  None. FINDINGS: Lung volumes are low. The heart is enlarged. Aortic tortuosity. Minor atelectasis at the right lung base. No confluent consolidation. No pleural effusion or pneumothorax. No pulmonary edema. Left humeral head arthroplasty is partially included. IMPRESSION: Low lung volumes with mild atelectasis at the right lung base. Electronically Signed   By: Narda RutherfordMelanie  Sanford M.D.   On: 03/19/2021 22:52   DG Swallowing Func-Speech Pathology  Result Date: 03/25/2021 Table formatting from the original result was not included. Objective Swallowing Evaluation: Type of Study: MBS-Modified Barium Swallow Study  Patient Details Name: Milon DikesJohn Blixt MRN: [id number] Date of Birth: May 22, 1946 Today's Date: 03/25/2021 Time: SLP Start Time (ACUTE ONLY): 0840 -SLP Stop Time (ACUTE ONLY): 0903 SLP Time Calculation (min) (ACUTE ONLY): 23 min Past Medical History:  Past Medical History: Diagnosis Date  Hypertension  Past Surgical History: Past Surgical History: Procedure Laterality Date  CATARACT EXTRACTION, BILATERAL    KNEE SURGERY   HPI: 75 year old gentleman admitted with Acute metabolic encephalopathy in a patient with advanced dementia. 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 Pt with history of hypertension, dementia, dysphagia with PEG tube in place. Pt was [facility] by [place] living [place] [facility].  He was admitted with multiple issues including dysphagia and aspiration pneumonia.  Discharge note indicates patient eating mechanical soft with honey thick liquids. Pt reportedly had 2 MBS while admitted. SLP notes not found, but radiology notes report silent aspriation of necter in one trial via straw, otherwise no aspiration.  Subjective: pt awke in chair Assessment / Plan / Recommendation CHL IP CLINICAL IMPRESSIONS 03/25/2021 Clinical Impression Patient presents with oral more than pharyngeal dysphagia c/b decreased oral coordination with premature spillage of barium into pharynx.  Solids accumulate to pyriform sinus region with swallow triggering at 8 seconds.  Aspiration of thin noted due to delay in swallow and barium mixing with secretions.  Pharyngeal swallow is strong but DELAYED and this delay, decreased ability to follow directions and with weak cough - raise pt's aspiration pna risk significantly.   Recommend pt have puree/nectar and allowed tsp of thin.  Soft solids from family when visiting ok but institutionalized feeding still recommend dys1/nectar.  Oral suctioning advised before and after meals and strict dental brushing am and pm.   Follow up briefly for dysphagia management at SNF advised to determine appropriateness for advancement.  Skilled SLP included trials of compensations - chin tuck inadquately performed and cued  cough/dry swallows not helpful. SLP Visit Diagnosis Dysphagia, oropharyngeal phase (R13.12) Attention and concentration deficit following -- Frontal lobe and executive function deficit  following -- Impact on safety and function Moderate aspiration risk   CHL IP TREATMENT RECOMMENDATION 03/25/2021 Treatment Recommendations Therapy as outlined in treatment plan below   Prognosis 03/25/2021 Prognosis for Safe Diet Advancement Fair Barriers to Reach Goals Cognitive deficits;Severity of deficits Barriers/Prognosis Comment -- CHL IP DIET RECOMMENDATION 03/25/2021 SLP Diet Recommendations Nectar thick liquid;Thin liquid;Dysphagia 1 (Puree) solids Liquid Administration via Cup;Straw Medication Administration Via alternative means Compensations Slow rate;Small sips/bites Postural Changes Remain semi-upright after after feeds/meals (Comment);Seated upright at 90 degrees   CHL IP OTHER RECOMMENDATIONS 03/25/2021 Recommended Consults -- Oral Care Recommendations Oral care BID Other Recommendations --   CHL IP FOLLOW UP RECOMMENDATIONS 03/25/2021 Follow up Recommendations Skilled Nursing facility   [facility] IP FREQUENCY AND DURATION 03/25/2021 Speech Therapy Frequency (ACUTE ONLY) min 2x/week Treatment Duration 2 weeks      CHL IP ORAL PHASE 03/25/2021 Oral Phase Impaired Oral - Pudding Teaspoon -- Oral - Pudding Cup -- Oral - Honey Teaspoon -- Oral - Honey Cup -- Oral - Nectar Teaspoon Decreased bolus cohesion Oral - Nectar Cup Decreased bolus cohesion;Premature spillage Oral - Nectar Straw Decreased bolus cohesion;Premature spillage Oral - Thin Teaspoon Decreased bolus cohesion;Premature spillage Oral - Thin Cup Decreased bolus cohesion;Lingual/palatal residue;Premature spillage Oral - Thin Straw Decreased bolus cohesion;Lingual/palatal residue;Premature spillage Oral - Puree Decreased bolus cohesion;Premature spillage Oral - Mech Soft Decreased bolus cohesion;Lingual/palatal residue;Premature spillage;Weak lingual manipulation;Piecemeal  swallowing;Other (Comment) Oral - Regular -- Oral - Multi-Consistency -- Oral - Pill -- Oral Phase - Comment --  CHL IP PHARYNGEAL PHASE 03/25/2021 Pharyngeal Phase Impaired Pharyngeal- Pudding Teaspoon -- Pharyngeal -- Pharyngeal- Pudding Cup -- Pharyngeal -- Pharyngeal- Honey Teaspoon -- Pharyngeal -- Pharyngeal- Honey Cup -- Pharyngeal -- Pharyngeal- Nectar Teaspoon Delayed swallow initiation-pyriform sinuses;Delayed swallow initiation-vallecula Pharyngeal Material does not enter airway Pharyngeal- Nectar Cup Delayed swallow initiation-pyriform sinuses;Delayed swallow initiation-vallecula Pharyngeal Material enters airway, CONTACTS cords and not ejected out Pharyngeal- Nectar Straw Delayed swallow initiation-pyriform sinuses;Delayed swallow initiation-vallecula;Penetration/Aspiration during swallow Pharyngeal Material enters airway, CONTACTS cords and not ejected out Pharyngeal- Thin Teaspoon Delayed swallow initiation-pyriform sinuses;Penetration/Aspiration during swallow Pharyngeal Material enters airway, passes BELOW cords without attempt by patient to eject out (silent aspiration) Pharyngeal- Thin Cup Delayed swallow initiation-vallecula;Delayed swallow initiation-pyriform sinuses;Penetration/Aspiration during swallow Pharyngeal Material enters airway, passes BELOW cords without attempt by patient to eject out (silent aspiration) Pharyngeal- Thin Straw Delayed swallow initiation-pyriform sinuses Pharyngeal Material enters airway, passes BELOW cords without attempt by patient to eject out (silent aspiration) Pharyngeal- Puree Delayed swallow initiation-pyriform sinuses Pharyngeal Material does not enter airway Pharyngeal- Mechanical Soft Delayed swallow initiation-pyriform sinuses Pharyngeal Material does not enter airway Pharyngeal- Regular -- Pharyngeal -- Pharyngeal- Multi-consistency -- Pharyngeal -- Pharyngeal- Pill -- Pharyngeal -- Pharyngeal Comment minimal pharyngeal retention mixed with secretions -  with aspiration - silent in nature-, pt did not cough on comman nor to aspiration - cough noted x3 during MBS - without barium visualized in proximal trachea or larynx at that time - ? delayed response to aspiration?  CHL IP CERVICAL ESOPHAGEAL PHASE 03/25/2021 Cervical Esophageal Phase Impaired Pudding Teaspoon -- Pudding Cup -- Honey Teaspoon -- Honey Cup -- Nectar Teaspoon -- Nectar Cup -- Nectar Straw -- Thin Teaspoon -- Thin Cup -- Thin Straw -- Puree -- Mechanical Soft -- Regular -- Multi-consistency -- Pill -- Cervical Esophageal Comment -- Rolena Infante, MS [facility] SLP Acute Rehab Services Office [phone] Pager [phone] Chales Abrahams 03/25/2021, 9:43 AM  Subjective: No new complaints.   Discharge Exam: Vitals:   03/27/21 1312 03/27/21 2025  BP: (!) 139/58 102/63  Pulse: 66 73  Resp:  15  Temp: 97.7 F (36.5 C) 98.2 F (36.8 C)  SpO2: 98% 99%   Vitals:   03/27/21 0020 03/27/21 0418 03/27/21 1312 03/27/21 2025  BP: (!) 125/57 (!) 175/61 (!) 139/58 102/63  Pulse: 69 62 66 73  Resp: 18 19  15   Temp: 97.9 F (36.6 C) 98.7 F (37.1 C) 97.7 F (36.5 C) 98.2 F (36.8 C)  TempSrc: Oral  Oral Oral  SpO2: 98% 100% 98% 99%  Weight:      Height:        General: Pt is alert, awake, not in acute distress Cardiovascular: RRR, S1/S2 +, no rubs, no gallops Respiratory: CTA bilaterally, no wheezing, no rhonchi Abdominal: Soft, NT, ND, bowel sounds + Extremities: no edema, no cyanosis    The results of significant diagnostics from this hospitalization (including imaging, microbiology, ancillary and laboratory) are listed below for reference.     Microbiology: Recent Results (from the past 240 hour(s))  Resp Panel by RT-PCR (Flu A&B, Covid) Nasopharyngeal Swab     Status: None   Collection Time: 03/19/21 10:09 PM   Specimen: Nasopharyngeal Swab; Nasopharyngeal(NP) swabs in vial transport medium  Result Value Ref Range Status   SARS Coronavirus 2 by RT PCR NEGATIVE  NEGATIVE Final    Comment: (NOTE) SARS-CoV-2 target nucleic acids are NOT DETECTED.  The SARS-CoV-2 RNA is generally detectable in upper respiratory specimens during the acute phase of infection. The lowest concentration of SARS-CoV-2 viral copies this assay can detect is 138 copies/mL. A negative result does not preclude SARS-Cov-2 infection and should not be used as the sole basis for treatment or other patient management decisions. A negative result may occur with  improper specimen collection/handling, submission of specimen other than nasopharyngeal swab, presence of viral mutation(s) within the areas targeted by this assay, and inadequate number of viral copies(<138 copies/mL). A negative result must be combined with clinical observations, patient history, and epidemiological information. The expected result is Negative.  Fact Sheet for Patients:  05/19/21  Fact Sheet for Healthcare Providers:  BloggerCourse.com  This test is no t yet approved or cleared by the [employer] FDA and  has been authorized for detection and/or diagnosis of SARS-CoV-2 by FDA under an Emergency Use Authorization (EUA). This EUA will remain  in effect (meaning this test can be used) for the duration of the COVID-19 declaration under Section 564(b)(1) of the Act, 21 U.S.C.section 360bbb-3(b)(1), unless the authorization is terminated  or revoked sooner.       Influenza A by PCR NEGATIVE NEGATIVE Final   Influenza B by PCR NEGATIVE NEGATIVE Final    Comment: (NOTE) The Xpert Xpress SARS-CoV-2/FLU/RSV plus assay is intended as an aid in the diagnosis of influenza from Nasopharyngeal swab specimens and should not be used as a sole basis for treatment. Nasal washings and aspirates are unacceptable for Xpert Xpress SARS-CoV-2/FLU/RSV testing.  Fact Sheet for Patients: [place]  Fact Sheet for Healthcare  Providers: BloggerCourse.com  This test is not yet approved or cleared by the [employer]ious[employer]ker.[employer] FDA and has been authorized for detection and/or diagnosis of SARS-CoV-2 by FDA under an Emergency Use Authorization (EUA). This EUA will remain in effect (meaning this test can be used) for the duration of the COVID-19 declaration under Section 564(b)(1) of the Act, 21 U.S.C. section 360bbb-3(b)(1), unless the authorization is terminated  or revoked.  Performed at [facility], [address] [place], [place] [address]   Blood culture (routine x 2)     Status: None   Collection Time: 03/19/21 10:24 PM   Specimen: BLOOD  Result Value Ref Range Status   Specimen Description   Final    BLOOD LEFT ANTECUBITAL Performed at [facility], [address] [address], [place], [place] [address]    Special Requests   Final    BOTTLES DRAWN AEROBIC AND ANAEROBIC Blood Culture adequate volume Performed at [facility], [address] [place], [place] [address]    Culture   Final    NO GROWTH 5 DAYS Performed at [facility], [address] [place], [place] [address]    Report Status 03/25/2021 FINAL  Final  Blood culture (routine x 2)     Status: None   Collection Time: 03/19/21 10:24 PM   Specimen: BLOOD  Result Value Ref Range Status   Specimen Description   Final    BLOOD BLOOD LEFT HAND Performed at [facility], [address], [place], [place] [address]    Special Requests   Final    BOTTLES DRAWN AEROBIC AND ANAEROBIC Blood Culture results may not be optimal due to an inadequate volume of blood received in culture bottles Performed at [facility], [address], [place], [place] [address]    Culture   Final    NO GROWTH 5 DAYS Performed at [facility], [address], [place], [place] [address]    Report Status 03/25/2021 FINAL  Final  Urine Culture     Status:  Abnormal   Collection Time: 03/20/21  1:51 AM   Specimen: Urine, Clean Catch  Result Value Ref Range Status   Specimen Description   Final    URINE, CLEAN CATCH Performed at [facility], [address] [place], [place] [address]    Special Requests   Final    NONE Performed at [facility], [address], [place], [place] [address]    Culture MULTIPLE SPECIES PRESENT, SUGGEST RECOLLECTION (A)  Final   Report Status 03/21/2021 FINAL  Final  MRSA Next Gen by PCR, Nasal     Status: None   Collection Time: 03/22/21  8:12 AM   Specimen: Nasal Mucosa; Nasal Swab  Result Value Ref Range Status   MRSA by PCR Next Gen NOT DETECTED NOT DETECTED Final    Comment: (NOTE) The GeneXpert MRSA Assay (FDA approved for NASAL specimens only), is one component of a comprehensive MRSA colonization surveillance program. It is not intended to diagnose MRSA infection nor to guide or monitor treatment for MRSA infections. Test performance is not FDA approved in patients less than 64 years old. Performed at [facility], [address], [place], [place] [address]   SARS CORONAVIRUS 2 (TAT 6-24 HRS) Nasopharyngeal Nasopharyngeal Swab     Status: None   Collection Time: 03/27/21  5:35 PM   Specimen: Nasopharyngeal Swab  Result Value Ref Range Status   SARS Coronavirus 2 NEGATIVE NEGATIVE Final    Comment: (NOTE) SARS-CoV-2 target nucleic acids are NOT DETECTED.  The SARS-CoV-2 RNA is generally detectable in upper and lower respiratory specimens during the acute phase of infection. Negative results do not preclude SARS-CoV-2 infection, do not rule out co-infections with other pathogens, and should not be used as the sole basis for treatment or other patient management  decisions. Negative results must be combined with clinical observations, patient history, and epidemiological information. The expected result is Negative.  Fact Sheet for  Patients: HairSlick.no  Fact Sheet for Healthcare Providers: quierodirigir.com  This test is not yet approved or cleared by the [place] FDA and  has been authorized for detection and/or diagnosis of SARS-CoV-2 by FDA under an Emergency Use Authorization (EUA). This EUA will remain  in effect (meaning this test can be used) for the duration of the COVID-19 declaration under Se ction 564(b)(1) of the Act, 21 U.S.C. section 360bbb-3(b)(1), unless the authorization is terminated or revoked sooner.  Performed at [facility], [address] [place], [place] [address]      Labs: BNP (last 3 results) No results for input(s): BNP in the last 8760 hours. Basic Metabolic Panel: Recent Labs  Lab 03/22/21 0236 03/22/21 0933 03/23/21 0248 03/24/21 0850 03/26/21 0659  NA 139  --  135 137 139  K 4.9  --  5.1 5.1 5.0  CL 107  --  103 101 101  CO2 25  --  GLUCOSE 87 79 135* 91 103*  BUN 37*  --  36* 40* 40*  CREATININE 1.66*  --  1.66* 1.77* 1.61*  CALCIUM 8.9  --  8.8* 8.7* 9.3  MG  --   --  1.7  --   --   PHOS  --   --  4.3  --   --    Liver Function Tests: No results for input(s): AST, ALT, ALKPHOS, BILITOT, PROT, ALBUMIN in the last 168 hours. No results for input(s): LIPASE, AMYLASE in the last 168 hours. No results for input(s): AMMONIA in the last 168 hours. CBC: Recent Labs  Lab 03/23/21 0248 03/24/21 0239 03/25/21 0516 03/26/21 0659 03/27/21 0603  WBC 5.1 4.2 4.6 4.3 5.4  NEUTROABS 3.2 2.3 2.2 2.6 3.0  HGB 9.4* 8.7* 9.6* 10.3* 9.8*  HCT 28.9* 26.7* 29.5* 31.3* 29.5*  MCV 90.0 90.2 89.9 91.0 89.7  PLT 142* 147* 148* 157 162   Cardiac Enzymes: No results for input(s): CKTOTAL, CKMB, CKMBINDEX, TROPONINI in the last 168 hours. BNP: Invalid input(s): POCBNP CBG: Recent Labs  Lab 03/27/21 1303 03/27/21 1628 03/27/21 1953 03/27/21 2349 03/28/21 0401  GLUCAP 98 102* 87 113* 113*    D-Dimer No results for input(s): DDIMER in the last 72 hours. Hgb A1c No results for input(s): HGBA1C in the last 72 hours. Lipid Profile No results for input(s): CHOL, HDL, LDLCALC, TRIG, CHOLHDL, LDLDIRECT in the last 72 hours. Thyroid function studies No results for input(s): TSH, T4TOTAL, T3FREE, THYROIDAB in the last 72 hours.  Invalid input(s): FREET3 Anemia work up No results for input(s): VITAMINB12, FOLATE, FERRITIN, TIBC, IRON, RETICCTPCT in the last 72 hours. Urinalysis    Component Value Date/Time   COLORURINE YELLOW (A) 03/20/2021 0151   APPEARANCEUR CLEAR (A) 03/20/2021 0151   LABSPEC <1.005 (L) 03/20/2021 0151   PHURINE 7.0 03/20/2021 0151   GLUCOSEU NEGATIVE 03/20/2021 0151   HGBUR LARGE (A) 03/20/2021 0151   BILIRUBINUR NEGATIVE 03/20/2021 0151   KETONESUR NEGATIVE 03/20/2021 0151   PROTEINUR 30 (A) 03/20/2021 0151   NITRITE NEGATIVE 03/20/2021 0151   LEUKOCYTESUR NEGATIVE 03/20/2021 0151   Sepsis Labs Invalid input(s): PROCALCITONIN,  WBC,  LACTICIDVEN Microbiology Recent Results (from the past 240 hour(s))  Resp Panel by RT-PCR (Flu A&B, Covid) Nasopharyngeal Swab     Status: None   Collection Time: 03/19/21 10:09 PM  Specimen: Nasopharyngeal Swab; Nasopharyngeal(NP) swabs in vial transport medium  Result Value Ref Range Status   SARS Coronavirus 2 by RT PCR NEGATIVE NEGATIVE Final    Comment: (NOTE) SARS-CoV-2 target nucleic acids are NOT DETECTED.  The SARS-CoV-2 RNA is generally detectable in upper respiratory specimens during the acute phase of infection. The lowest concentration of SARS-CoV-2 viral copies this assay can detect is 138 copies/mL. A negative result does not preclude SARS-Cov-2 infection and should not be used as the sole basis for treatment or other patient management decisions. A negative result may occur with  improper specimen collection/handling, submission of specimen other than nasopharyngeal swab, presence of viral  mutation(s) within the areas targeted by this assay, and inadequate number of viral copies(<138 copies/mL). A negative result must be combined with clinical observations, patient history, and epidemiological information. The expected result is Negative.  Fact Sheet for Patients:  BloggerCourse.com  Fact Sheet for Healthcare Providers:  SeriousBroker.it  This test is no t yet approved or cleared by the [place] FDA and  has been authorized for detection and/or diagnosis of SARS-CoV-2 by FDA under an Emergency Use Authorization (EUA). This EUA will remain  in effect (meaning this test can be used) for the duration of the COVID-19 declaration under Section 564(b)(1) of the Act, 21 U.S.C.section 360bbb-3(b)(1), unless the authorization is terminated  or revoked sooner.       Influenza A by PCR NEGATIVE NEGATIVE Final   Influenza B by PCR NEGATIVE NEGATIVE Final    Comment: (NOTE) The Xpert Xpress SARS-CoV-2/FLU/RSV plus assay is intended as an aid in the diagnosis of influenza from Nasopharyngeal swab specimens and should not be used as a sole basis for treatment. Nasal washings and aspirates are unacceptable for Xpert Xpress SARS-CoV-2/FLU/RSV testing.  Fact Sheet for Patients: BloggerCourse.com  Fact Sheet for Healthcare Providers: SeriousBroker.it  This test is not yet approved or cleared by the [place] FDA and has been authorized for detection and/or diagnosis of SARS-CoV-2 by FDA under an Emergency Use Authorization (EUA). This EUA will remain in effect (meaning this test can be used) for the duration of the COVID-19 declaration under Section 564(b)(1) of the Act, 21 U.S.C. section 360bbb-3(b)(1), unless the authorization is terminated or revoked.  Performed at [facility], [address] [place], [place] [address]   Blood culture (routine  x 2)     Status: None   Collection Time: 03/19/21 10:24 PM   Specimen: BLOOD  Result Value Ref Range Status   Specimen Description   Final    BLOOD LEFT ANTECUBITAL Performed at [facility], [address], [place], [place] [address]    Special Requests   Final    BOTTLES DRAWN AEROBIC AND ANAEROBIC Blood Culture adequate volume Performed at [facility], [address] [place] [address] [place], [place] [address]    Culture   Final    NO GROWTH 5 DAYS Performed at [facility], [address] [place], [place] [address]    Report Status 03/25/2021 FINAL  Final  Blood culture (routine x 2)     Status: None   Collection Time: 03/19/21 10:24 PM   Specimen: BLOOD  Result Value Ref Range Status   Specimen Description   Final    BLOOD BLOOD LEFT HAND Performed at [facility], [address] [place], [place] [address]    Special Requests   Final    BOTTLES DRAWN AEROBIC AND ANAEROBIC Blood Culture results  may not be optimal due to an inadequate volume of blood received in culture bottles Performed at [facility], [address] [place], [place] [address]    Culture   Final    NO GROWTH 5 DAYS Performed at [facility], [address] [place], [place] [address]    Report Status 03/25/2021 FINAL  Final  Urine Culture     Status: Abnormal   Collection Time: 03/20/21  1:51 AM   Specimen: Urine, Clean Catch  Result Value Ref Range Status   Specimen Description   Final    URINE, CLEAN CATCH Performed at [facility], [address], [place], [place] [address]    Special Requests   Final    NONE Performed at [facility], [address], [place], [place] [address]    Culture MULTIPLE SPECIES PRESENT, SUGGEST RECOLLECTION (A)  Final   Report Status 03/21/2021 FINAL  Final  MRSA Next Gen by PCR, Nasal     Status: None   Collection Time: 03/22/21  8:12 AM   Specimen:  Nasal Mucosa; Nasal Swab  Result Value Ref Range Status   MRSA by PCR Next Gen NOT DETECTED NOT DETECTED Final    Comment: (NOTE) The GeneXpert MRSA Assay (FDA approved for NASAL specimens only), is one component of a comprehensive MRSA colonization surveillance program. It is not intended to diagnose MRSA infection nor to guide or monitor treatment for MRSA infections. Test performance is not FDA approved in patients less than 43 years old. Performed at [facility], [address], [place], [place] [address]   SARS CORONAVIRUS 2 (TAT 6-24 HRS) Nasopharyngeal Nasopharyngeal Swab     Status: None   Collection Time: 03/27/21  5:35 PM   Specimen: Nasopharyngeal Swab  Result Value Ref Range Status   SARS Coronavirus 2 NEGATIVE NEGATIVE Final    Comment: (NOTE) SARS-CoV-2 target nucleic acids are NOT DETECTED.  The SARS-CoV-2 RNA is generally detectable in upper and lower respiratory specimens during the acute phase of infection. Negative results do not preclude SARS-CoV-2 infection, do not rule out co-infections with other pathogens, and should not be used as the sole basis for treatment or other patient management decisions. Negative results must be combined with clinical observations, patient history, and epidemiological information. The expected result is Negative.  Fact Sheet for Patients: HairSlick.no  Fact Sheet for Healthcare Providers: quierodirigir.com  This test is not yet approved or cleared by the [place] FDA and  has been authorized for detection and/or diagnosis of SARS-CoV-2 by FDA under an Emergency Use Authorization (EUA). This EUA will remain  in effect (meaning this test can be used) for the duration of the COVID-19 declaration under Se ction 564(b)(1) of the Act, 21 U.S.C. section 360bbb-3(b)(1), unless the authorization is terminated or revoked sooner.  Performed at [facility], [address], [place], [place] [address]      Time coordinating discharge: 36 minutes.   SIGNED:   Kathlen Mody, MD  [facility] 03/28/2021, 8:29 AM

## 2021-03-31 LAB — GLUCOSE, CAPILLARY: Glucose-Capillary: 38 mg/dL — CL (ref 70–99)

## 2021-03-31 LAB — SULFONYLUREA HYPOGLYCEMICS PANEL, SERUM
Acetohexamide: NEGATIVE ug/mL (ref 20–60)
Chlorpropamide: NEGATIVE ug/mL (ref 75–250)
Glimepiride: NEGATIVE ng/mL (ref 80–250)
Glipizide: NEGATIVE ng/mL (ref 200–1000)
Glyburide: NEGATIVE ng/mL
Nateglinide: NEGATIVE ng/mL
Repaglinide: NEGATIVE ng/mL
Tolazamide: NEGATIVE ug/mL
Tolbutamide: NEGATIVE ug/mL (ref 40–100)

## 2021-04-20 ENCOUNTER — Inpatient Hospital Stay (HOSPITAL_COMMUNITY)
Admission: EM | Admit: 2021-04-20 | Discharge: 2021-04-26 | DRG: 871 | Disposition: A | Discharge status: Skilled Nursing Facility | Payer: Medicare Other | Source: Skilled Nursing Facility | Attending: Internal Medicine | Admitting: Internal Medicine

## 2021-04-20 ENCOUNTER — Other Ambulatory Visit: Payer: Self-pay

## 2021-04-20 ENCOUNTER — Emergency Department (HOSPITAL_COMMUNITY): Payer: Medicare Other

## 2021-04-20 ENCOUNTER — Encounter (HOSPITAL_COMMUNITY): Payer: Self-pay

## 2021-04-20 DIAGNOSIS — Z8249 Family history of ischemic heart disease and other diseases of the circulatory system: Secondary | ICD-10-CM | POA: Diagnosis not present

## 2021-04-20 DIAGNOSIS — F03918 Unspecified dementia, unspecified severity, with other behavioral disturbance: Secondary | ICD-10-CM | POA: Diagnosis present

## 2021-04-20 DIAGNOSIS — N183 Chronic kidney disease, stage 3 unspecified: Secondary | ICD-10-CM | POA: Diagnosis present

## 2021-04-20 DIAGNOSIS — Z931 Gastrostomy status: Secondary | ICD-10-CM

## 2021-04-20 DIAGNOSIS — N179 Acute kidney failure, unspecified: Secondary | ICD-10-CM | POA: Diagnosis present

## 2021-04-20 DIAGNOSIS — A419 Sepsis, unspecified organism: Secondary | ICD-10-CM

## 2021-04-20 DIAGNOSIS — Z7189 Other specified counseling: Secondary | ICD-10-CM | POA: Diagnosis not present

## 2021-04-20 DIAGNOSIS — Z20822 Contact with and (suspected) exposure to covid-19: Secondary | ICD-10-CM | POA: Diagnosis present

## 2021-04-20 DIAGNOSIS — J189 Pneumonia, unspecified organism: Secondary | ICD-10-CM

## 2021-04-20 DIAGNOSIS — I129 Hypertensive chronic kidney disease with stage 1 through stage 4 chronic kidney disease, or unspecified chronic kidney disease: Secondary | ICD-10-CM | POA: Diagnosis present

## 2021-04-20 DIAGNOSIS — G9341 Metabolic encephalopathy: Secondary | ICD-10-CM | POA: Diagnosis not present

## 2021-04-20 DIAGNOSIS — R6520 Severe sepsis without septic shock: Secondary | ICD-10-CM

## 2021-04-20 DIAGNOSIS — E162 Hypoglycemia, unspecified: Secondary | ICD-10-CM | POA: Diagnosis present

## 2021-04-20 DIAGNOSIS — H9193 Unspecified hearing loss, bilateral: Secondary | ICD-10-CM | POA: Diagnosis present

## 2021-04-20 DIAGNOSIS — F17210 Nicotine dependence, cigarettes, uncomplicated: Secondary | ICD-10-CM | POA: Diagnosis present

## 2021-04-20 DIAGNOSIS — Y95 Nosocomial condition: Secondary | ICD-10-CM | POA: Diagnosis present

## 2021-04-20 DIAGNOSIS — N1832 Chronic kidney disease, stage 3b: Secondary | ICD-10-CM | POA: Diagnosis present

## 2021-04-20 DIAGNOSIS — F03A18 Unspecified dementia, mild, with other behavioral disturbance: Secondary | ICD-10-CM

## 2021-04-20 DIAGNOSIS — R001 Bradycardia, unspecified: Secondary | ICD-10-CM | POA: Diagnosis present

## 2021-04-20 DIAGNOSIS — F0390 Unspecified dementia without behavioral disturbance: Secondary | ICD-10-CM | POA: Diagnosis present

## 2021-04-20 DIAGNOSIS — J690 Pneumonitis due to inhalation of food and vomit: Secondary | ICD-10-CM | POA: Diagnosis not present

## 2021-04-20 DIAGNOSIS — Z79899 Other long term (current) drug therapy: Secondary | ICD-10-CM

## 2021-04-20 DIAGNOSIS — N1831 Chronic kidney disease, stage 3a: Secondary | ICD-10-CM | POA: Diagnosis present

## 2021-04-20 DIAGNOSIS — Z9181 History of falling: Secondary | ICD-10-CM

## 2021-04-20 DIAGNOSIS — Z6823 Body mass index (BMI) 23.0-23.9, adult: Secondary | ICD-10-CM

## 2021-04-20 DIAGNOSIS — R1310 Dysphagia, unspecified: Secondary | ICD-10-CM | POA: Diagnosis present

## 2021-04-20 DIAGNOSIS — Z8701 Personal history of pneumonia (recurrent): Secondary | ICD-10-CM

## 2021-04-20 DIAGNOSIS — E440 Moderate protein-calorie malnutrition: Secondary | ICD-10-CM | POA: Diagnosis present

## 2021-04-20 HISTORY — DX: Sepsis, unspecified organism: R65.20

## 2021-04-20 HISTORY — DX: Pneumonia, unspecified organism: J18.9

## 2021-04-20 HISTORY — DX: Sepsis, unspecified organism: A41.9

## 2021-04-20 HISTORY — DX: Presence of other specified devices: Z97.8

## 2021-04-20 HISTORY — DX: Unspecified dementia, unspecified severity, without behavioral disturbance, psychotic disturbance, mood disturbance, and anxiety: F03.90

## 2021-04-20 LAB — CBC WITH DIFFERENTIAL/PLATELET
Abs Immature Granulocytes: 0.01 10*3/uL (ref 0.00–0.07)
Basophils Absolute: 0 10*3/uL (ref 0.0–0.1)
Basophils Relative: 0 %
Eosinophils Absolute: 0.2 10*3/uL (ref 0.0–0.5)
Eosinophils Relative: 6 %
HCT: 32.2 % — ABNORMAL LOW (ref 39.0–52.0)
Hemoglobin: 10.1 g/dL — ABNORMAL LOW (ref 13.0–17.0)
Immature Granulocytes: 0 %
Lymphocytes Relative: 41 %
Lymphs Abs: 1.4 10*3/uL (ref 0.7–4.0)
MCH: 29 pg (ref 26.0–34.0)
MCHC: 31.4 g/dL (ref 30.0–36.0)
MCV: 92.5 fL (ref 80.0–100.0)
Monocytes Absolute: 0.3 10*3/uL (ref 0.1–1.0)
Monocytes Relative: 9 %
Neutro Abs: 1.4 10*3/uL — ABNORMAL LOW (ref 1.7–7.7)
Neutrophils Relative %: 44 %
Platelets: 89 10*3/uL — ABNORMAL LOW (ref 150–400)
RBC: 3.48 MIL/uL — ABNORMAL LOW (ref 4.22–5.81)
RDW: 13.6 % (ref 11.5–15.5)
WBC: 3.3 10*3/uL — ABNORMAL LOW (ref 4.0–10.5)
nRBC: 0 % (ref 0.0–0.2)

## 2021-04-20 LAB — BASIC METABOLIC PANEL
Anion gap: 10 (ref 5–15)
BUN: 54 mg/dL — ABNORMAL HIGH (ref 8–23)
CO2: 28 mmol/L (ref 22–32)
Calcium: 9.7 mg/dL (ref 8.9–10.3)
Chloride: 107 mmol/L (ref 98–111)
Creatinine, Ser: 2.01 mg/dL — ABNORMAL HIGH (ref 0.61–1.24)
GFR, Estimated: 34 mL/min — ABNORMAL LOW (ref >60)
Glucose, Bld: 119 mg/dL — ABNORMAL HIGH (ref 70–99)
Potassium: 5.6 mmol/L — ABNORMAL HIGH (ref 3.5–5.1)
Sodium: 145 mmol/L (ref 135–145)

## 2021-04-20 LAB — URINALYSIS, ROUTINE W REFLEX MICROSCOPIC
Bacteria, UA: NONE SEEN
Bilirubin Urine: NEGATIVE
Glucose, UA: NEGATIVE mg/dL
Ketones, ur: NEGATIVE mg/dL
Leukocytes,Ua: NEGATIVE
Nitrite: NEGATIVE
Protein, ur: 30 mg/dL — AB
Specific Gravity, Urine: 1.009 (ref 1.005–1.030)
pH: 7 (ref 5.0–8.0)

## 2021-04-20 LAB — CBC
HCT: 33.8 % — ABNORMAL LOW (ref 39.0–52.0)
Hemoglobin: 10.8 g/dL — ABNORMAL LOW (ref 13.0–17.0)
MCH: 29.3 pg (ref 26.0–34.0)
MCHC: 32 g/dL (ref 30.0–36.0)
MCV: 91.6 fL (ref 80.0–100.0)
Platelets: 88 10*3/uL — ABNORMAL LOW (ref 150–400)
RBC: 3.69 MIL/uL — ABNORMAL LOW (ref 4.22–5.81)
RDW: 13.3 % (ref 11.5–15.5)
WBC: 2.8 10*3/uL — ABNORMAL LOW (ref 4.0–10.5)
nRBC: 0 % (ref 0.0–0.2)

## 2021-04-20 LAB — CREATININE, SERUM
Creatinine, Ser: 1.87 mg/dL — ABNORMAL HIGH (ref 0.61–1.24)
GFR, Estimated: 37 mL/min — ABNORMAL LOW (ref >60)

## 2021-04-20 LAB — RESP PANEL BY RT-PCR (FLU A&B, COVID) ARPGX2
Influenza A by PCR: NEGATIVE
Influenza B by PCR: NEGATIVE
SARS Coronavirus 2 by RT PCR: NEGATIVE

## 2021-04-20 LAB — LACTIC ACID, PLASMA: Lactic Acid, Venous: 1 mmol/L (ref 0.5–1.9)

## 2021-04-20 MED ORDER — ENOXAPARIN SODIUM 40 MG/0.4ML IJ SOSY
40.0000 mg | PREFILLED_SYRINGE | INTRAMUSCULAR | Status: DC
  Administered 2021-04-21 – 2021-04-26 (×6): 40 mg via SUBCUTANEOUS
  Filled 2021-04-20 (×6): qty 0.4

## 2021-04-20 MED ORDER — SODIUM CHLORIDE 0.9 % IV SOLN
2.0000 g | Freq: Two times a day (BID) | INTRAVENOUS | Status: DC
  Administered 2021-04-20 – 2021-04-25 (×10): 2 g via INTRAVENOUS
  Filled 2021-04-20 (×12): qty 2

## 2021-04-20 MED ORDER — SODIUM CHLORIDE 0.9 % IV SOLN
INTRAVENOUS | Status: DC
  Administered 2021-04-20: 20 mL/h via INTRAVENOUS

## 2021-04-20 MED ORDER — SODIUM CHLORIDE 0.9 % IV SOLN: 500.0000 mg | Freq: Once | INTRAVENOUS | Status: DC

## 2021-04-20 MED ORDER — ACETAMINOPHEN 650 MG RE SUPP: 650.0000 mg | Freq: Four times a day (QID) | RECTAL | Status: DC | PRN

## 2021-04-20 MED ORDER — ACETAMINOPHEN 325 MG PO TABS: 650.0000 mg | ORAL_TABLET | Freq: Four times a day (QID) | ORAL | Status: DC | PRN

## 2021-04-20 MED ORDER — SODIUM CHLORIDE 0.9 % IV SOLN: 1.0000 g | Freq: Once | INTRAVENOUS | Status: DC

## 2021-04-20 MED ORDER — ONDANSETRON HCL 4 MG/2ML IJ SOLN: 4.0000 mg | Freq: Four times a day (QID) | INTRAMUSCULAR | Status: DC | PRN

## 2021-04-20 MED ORDER — LACTATED RINGERS IV SOLN: INTRAVENOUS | Status: DC

## 2021-04-20 MED ORDER — OSMOLITE 1.2 CAL PO LIQD
1000.0000 mL | ORAL | Status: DC
  Administered 2021-04-20: 1000 mL
  Filled 2021-04-20 (×2): qty 1000

## 2021-04-20 MED ORDER — LORAZEPAM 2 MG/ML IJ SOLN
0.5000 mg | Freq: Four times a day (QID) | INTRAMUSCULAR | Status: DC | PRN
  Administered 2021-04-20 – 2021-04-25 (×3): 0.5 mg via INTRAVENOUS
  Filled 2021-04-20 (×3): qty 1

## 2021-04-20 MED ORDER — SODIUM CHLORIDE 0.9% FLUSH
3.0000 mL | Freq: Two times a day (BID) | INTRAVENOUS | Status: DC
  Administered 2021-04-21 – 2021-04-26 (×4): 3 mL via INTRAVENOUS

## 2021-04-20 MED ORDER — ONDANSETRON HCL 4 MG PO TABS: 4.0000 mg | ORAL_TABLET | Freq: Four times a day (QID) | ORAL | Status: DC | PRN

## 2021-04-20 NOTE — H&P (Signed)
History and Physical  [name] XVQ:[id number] DOB: 01-22-1946 DOA: 04/20/2021  Referring physician: Bruce Donath, ED Physician PCP: Patient, No Pcp Per (Inactive)  Outpatient Specialists: None Patient coming from: Nursing Facility & is able to ambulate with full assistance, usually with wheelchair  Chief Complaint: Decreased level of consciousness  Please note that history was obtained from patient's daughter.  Patient has underlying dementia and is acutely confused due to infection and unable to interact with me. HPI: Jack Rivas is a 75 y.o. male with medical history significant for dementia, dysphagia status post feeding tube (although unclear as to what caused this.  No previous history of stroke ) and hypertension who has been residing in a skilled nursing facility for rehab.  Patient has history of dysphagia status post feeding tube.  He still able to take some p.o. with nectar thick liquids and recently had been stopped on his tube feedings.  Reportedly he had been diagnosed with pneumonia a few days prior and started on Levaquin which she had been getting through his PEG tube.  Confusion continued to persist and at family's insistence, he was sent to the emergency room today.  ED Course: In the emergency room, patient noted to be bradycardic with a heart rate in the 40s, and lab work noting acute kidney injury with a creatinine of 2 and white blood cell count of 3.3.  Also noted to have a respiratory rate as high as 45.  Chest x-ray consistent with pneumonia.  Findings consistent with sepsis secondary to pneumonia.  Patient not hypoxic.  Blood cultures done.  Patient started on IV fluids and antibiotics.  Hospitalist called for further admission.  Review of Systems: Patient seen in the emergency room. He is unable to give me a review of systems.  At times he will kind of make some unintelligible noises when talked to.  Does not really follow commands.  According to patient's daughter, his  baseline is that he is interactive, takes p.o. nectar thick liquids and participates in rehab with maximum assistance.   Past Medical History:  Diagnosis Date   Dementia (HCC)    Hypertension    Uses feeding tube    Past Surgical History:  Procedure Laterality Date   CATARACT EXTRACTION, BILATERAL     KNEE SURGERY      Social History:  reports that he has been smoking. He has never used smokeless tobacco. He reports current alcohol use. He reports that he does not use drugs. Ambulates with maximal assistance with wheelchair  No Known Allergies  Family history of high blood pressure  Prior to Admission medications   Medication Sig Start Date End Date Taking? Authorizing Provider  acetaminophen (TYLENOL) 325 MG tablet Take 650 mg by mouth every 6 (six) hours as needed for pain. 03/15/21   [provider]  diclofenac Sodium (VOLTAREN) 1 % GEL Apply 1 application topically 2 (two) times daily. Apply to right knee 08/26/19   [provider]  famotidine (PEPCID) 20 MG tablet Take 20 mg by mouth at bedtime. 03/15/21   [provider]  food thickener (SIMPLYTHICK, NECTAR/LEVEL 2/MILDLY THICK,) GEL Take 1 packet by mouth as needed. 03/28/21   Kathlen Mody, MD  melatonin 3 MG TABS tablet Take 6 mg by mouth at bedtime. 03/15/21   [provider]  Nutritional Supplements (FEEDING SUPPLEMENT, OSMOLITE 1.5 CAL,) LIQD Place 1,000 mLs into feeding tube continuous. 03/28/21   Kathlen Mody, MD  Nutritional Supplements (FEEDING SUPPLEMENT, PROSOURCE TF,) liquid Place 45 mLs  into feeding tube daily. 03/28/21   Kathlen Mody, MD  polyethylene glycol powder (GLYCOLAX/MIRALAX) 17 GM/SCOOP powder Take 17 g by mouth daily. Mix with 8oz of fluid 03/15/21   [provider]  QUEtiapine (SEROQUEL) 25 MG tablet Place 1 tablet (25 mg total) into feeding tube every morning. 03/28/21   Kathlen Mody, MD  QUEtiapine (SEROQUEL) 50 MG tablet Place 1 tablet (50 mg total) into feeding  tube at bedtime. 03/28/21   Kathlen Mody, MD  Water For Irrigation, Sterile (FREE WATER) SOLN Place 150 mLs into feeding tube every 4 (four) hours. 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 Cardiovascular: Bradycardic, irregular rhythm Respiratory: Scattered rhonchi Abdomen: Soft, nontender, nondistended, hypoactive bowel sounds, PEG tube noted Skin: No skin breaks, tears or lesions Musculoskeletal: No clubbing or cyanosis or edema Psychiatric: Underlying dementia with acute delirium Neurologic: No immediate focal deficits          Labs on Admission:  Basic Metabolic Panel: Recent Labs  Lab 04/20/21 1355  NA 145  K 5.6*  CL 107  CO2 28  GLUCOSE 119*  BUN 54*  CREATININE 2.01*  CALCIUM 9.7   Liver Function Tests: No results for input(s): AST, ALT, ALKPHOS, BILITOT, PROT, ALBUMIN in the last 168 hours. No results for input(s): LIPASE, AMYLASE in the last 168 hours. No results for input(s): AMMONIA in the last 168 hours. CBC: Recent Labs  Lab 04/20/21 1355  WBC 3.3*  NEUTROABS 1.4*  HGB 10.1*  HCT 32.2*  MCV 92.5  PLT 89*   Cardiac Enzymes: No results for input(s): CKTOTAL, CKMB, CKMBINDEX, TROPONINI in the last 168 hours.  BNP (last 3 results) No results for input(s): BNP in the last 8760 hours.  ProBNP (last 3 results) No results for input(s): PROBNP in the last 8760 hours.  CBG: No results for input(s): GLUCAP in the last 168 hours.  Radiological Exams on Admission: DG Chest Port 1 View  Result Date: 04/20/2021 CLINICAL DATA:  Pneumonia. EXAM: PORTABLE CHEST 1 VIEW COMPARISON:  03/19/2021  FINDINGS: The cardiac silhouette, mediastinal and hilar contours are is within normal limits and stable. Patchy airspace opacity in the right lower lobe consistent with pneumonia. No pleural effusions. No pneumothorax. IMPRESSION: Right lower lobe pneumonia. Electronically Signed   By: 05/19/2021 M.D.   On: 04/20/2021 15:12    EKG: Independently reviewed.  Bradycardic with irregular irregular rhythm, with possible atrial fibrillation  Assessment/Plan Present on Admission:  Severe sepsis from HCAP (healthcare-associated pneumonia)/aspiration pneumonia: Patient meets criteria for sepsis on admission given tachypnea and white blood cell count less than 4.0, with aspiration pneumonia source and acute kidney injury.  Lactic acid level normal.  Continue fluids and antibiotics.  Given that he was receiving Levaquin at facility, have changed to cefepime.   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  Acute kidney injury in the setting of CKD (chronic kidney disease), stage III (HCC): Secondary to sepsis.  IV fluids, follow-up labs in the morning   Bradycardia: Unclear etiology.  Patient does have reported previous history  of bradycardia.  Placed on telemetry.  As needed atropine if heart rate stays below 40 consistently  Active Problems:   Acute metabolic encephalopathy   Aspiration pneumonia (HCC)   Dementia (HCC)   Dysphagia   HCAP (healthcare-associated pneumonia)   CKD (chronic kidney disease), stage III (HCC)   Bradycardia   DVT prophylaxis: Lovenox, renally adjusted  Code Status: Full code as confirmed by daughter  Family Communication: Daughter at the bedside  Disposition Plan: Here for several days until sepsis resolved.  Back to skilled nursing after  Consults called: None  Admission status: Patient expected to be here past 2 midnights needing acute hospital services, therefore admit as inpatient  COVID  status: Pending  Level of care: Telemetry   Hollice Espy MD [facility] Pager [phone]  If 7PM-7AM, please contact night-coverage [email or web address] Password [email or web address]  04/20/2021, 8:07 PM

## 2021-04-20 NOTE — Progress Notes (Signed)
Called to patient bedside to suction trach per family request. Upon arrival, noted that patient has patent anatomical airway with a closed tracheotomy that family states has been closed for about 2 months. Family asks that a new tracheostomy be placed and that patient be frequently suctioned in that manner. Deep oropharyngeal suction done for moderate amounts of thick white secretions. Patient has strong wet cough that moves secretions up into his pharynx but he is not able to expectorate and has difficultly phonating past the mucous. However, NAD noted and room air saturations 100%. Once daughter witnessed the difference oral suction made, she agrees that a trach is not the best option at this time.

## 2021-04-20 NOTE — Progress Notes (Signed)
   04/20/21 2155  Assess: MEWS Score  Temp (!) 91.4 F (33 C)  BP (!) 161/75  Pulse Rate 98  Resp 14  Level of Consciousness Alert  SpO2 97 %  O2 Device Room Air  Assess: MEWS Score  MEWS Temp 2  MEWS Systolic 0  MEWS Pulse 0  MEWS RR 0  MEWS LOC 0  MEWS Score 2  MEWS Score Color Yellow  Assess: if the MEWS score is Yellow or Red  Were vital signs taken at a resting state? Yes  Focused Assessment No change from prior assessment  Does the patient meet 2 or more of the SIRS criteria? Yes  Does the patient have a confirmed or suspected source of infection? Yes  Provider and Rapid Response Notified? Yes  MEWS guidelines implemented *See Row Information* Yes  Treat  MEWS Interventions Other (Comment) (warm blankets and heat packs)  Pain Scale PAINAD  Breathing 0  Negative Vocalization 0  Facial Expression 0  Body Language 2  Consolability 1  PAINAD Score 3  Take Vital Signs  Increase Vital Sign Frequency  Red: Q 1hr X 4 then Q 4hr X 4, if remains red, continue Q 4hrs  Notify: Charge Nurse/RN  Name of Charge Nurse/RN Notified Lily Lovings., RN  Date Charge Nurse/RN Notified 04/20/21  Time Charge Nurse/RN Notified 2200  Notify: Provider  Provider Name/Title J. Garner Nash  Date Provider Notified 04/20/21  Time Provider Notified 2210  Notification Type Page (Secure chat)  Notification Reason Other (Comment) (Temp 91.4 Rectal)  Date of Provider Response 04/20/21  Time of Provider Response 2212  Notify: Rapid Response  Name of Rapid Response RN Notified Mandy, RN  Date Rapid Response Notified 04/20/21  Time Rapid Response Notified 2200  Assess: SIRS CRITERIA  SIRS Temperature  1  SIRS Pulse 1  SIRS Respirations  0  SIRS WBC 1  SIRS Score Sum  3  Received pt from ED, Pt alert to self, not able to follow commands, no s/s of respiratory distress and no c/o pain, will continue plan of care.

## 2021-04-20 NOTE — Progress Notes (Signed)
Pharmacy Antibiotic Note  Jack Rivas is a 75 y.o. male admitted on 04/20/2021 with pneumonia.  Pharmacy has been consulted for cefepime dosing.  Plan: Cefepime 2g IV q12h Follow up renal function & cultures  Height: 6\' 3"  (190.5 cm) Weight: 83.9 kg (185 lb) IBW/kg (Calculated) : 84.5  Temp (24hrs), Avg:98 F (36.7 C), Min:98 F (36.7 C), Max:98 F (36.7 C)  Recent Labs  Lab 04/20/21 1355  WBC 3.3*  CREATININE 2.01*    Estimated Creatinine Clearance: 37.7 mL/min (A) (by C-G formula based on SCr of 2.01 mg/dL (H)).    No Known Allergies  Antimicrobials this admission: 10/5 Cefepime >>  Dose adjustments this admission:  Microbiology results: 10/5 BCx:  Thank you for allowing pharmacy to be a part of this patient's care.  06/20/21, PharmD, BCPS Pharmacy: [phone] 04/20/2021 3:59 PM

## 2021-04-20 NOTE — ED Provider Notes (Signed)
[facility]-EMERGENCY DEPT Provider Note   CSN: [id number] Arrival date & time: 04/20/21  1300     History No chief complaint on file.   Jack Rivas is a 75 y.o. male.  75 year old male with history of dementia who presents from nursing home due to family concern for patient not being taken care of.  Patient diagnosed with pneumonia recently and is getting Levaquin per PEG tube.  Patient had complained of being cold which is why patient was brought here.  History is limited due to his current state      Past Medical History:  Diagnosis Date   Dementia (HCC)    Hypertension    Uses feeding tube     Patient Active Problem List   Diagnosis Date Noted   Malnutrition of moderate degree 03/21/2021   Acute metabolic encephalopathy 03/20/2021   Aspiration pneumonia (HCC) 03/20/2021   Dementia (HCC) 03/20/2021   Dysphagia 03/20/2021    Past Surgical History:  Procedure Laterality Date   CATARACT EXTRACTION, BILATERAL     KNEE SURGERY         No family history on file.  Social History   Tobacco Use   Smoking status: Light Smoker   Smokeless tobacco: Never  Substance Use Topics   Alcohol use: Yes   Drug use: Never    Home Medications Prior to Admission medications   Medication Sig Start Date End Date Taking? Authorizing Provider  acetaminophen (TYLENOL) 325 MG tablet Take 650 mg by mouth every 6 (six) hours as needed for pain. 03/15/21   [provider]  diclofenac Sodium (VOLTAREN) 1 % GEL Apply 1 application topically 2 (two) times daily. Apply to right knee 08/26/19   [provider]  famotidine (PEPCID) 20 MG tablet Take 20 mg by mouth at bedtime. 03/15/21   [provider]  food thickener (SIMPLYTHICK, NECTAR/LEVEL 2/MILDLY THICK,) GEL Take 1 packet by mouth as needed. 03/28/21   Kathlen Mody, MD  melatonin 3 MG TABS tablet Take 6 mg by mouth at bedtime. 03/15/21   [provider]  Nutritional Supplements  (FEEDING SUPPLEMENT, OSMOLITE 1.5 CAL,) LIQD Place 1,000 mLs into feeding tube continuous. 03/28/21   Kathlen Mody, MD  Nutritional Supplements (FEEDING SUPPLEMENT, PROSOURCE TF,) liquid Place 45 mLs into feeding tube daily. 03/28/21   Kathlen Mody, MD  polyethylene glycol powder (GLYCOLAX/MIRALAX) 17 GM/SCOOP powder Take 17 g by mouth daily. Mix with 8oz of fluid 03/15/21   [provider]  QUEtiapine (SEROQUEL) 25 MG tablet Place 1 tablet (25 mg total) into feeding tube every morning. 03/28/21   Kathlen Mody, MD  QUEtiapine (SEROQUEL) 50 MG tablet Place 1 tablet (50 mg total) into feeding tube at bedtime. 03/28/21   Kathlen Mody, MD  Water For Irrigation, Sterile (FREE WATER) SOLN Place 150 mLs into feeding tube every 4 (four) hours. 03/28/21   Kathlen Mody, MD    Allergies    Patient has no known allergies.  Review of Systems   Review of Systems  Unable to perform ROS: Acuity of condition   Physical Exam Updated Vital Signs BP 120/73   Pulse (!) 41   Temp 98 F (36.7 C) (Rectal)   Resp (!) 9   Ht 1.905 m (6\' 3" )   Wt 83.9 kg   SpO2 100%   BMI 23.12 kg/m   Physical Exam Vitals and nursing note reviewed.  Constitutional:      General: He is not in acute distress.    Appearance:  Normal appearance. He is well-developed. He is not toxic-appearing.  HENT:     Head: Normocephalic and atraumatic.  Eyes:     General: Lids are normal.     Conjunctiva/sclera: Conjunctivae normal.     Pupils: Pupils are equal, round, and reactive to light.  Neck:     Thyroid: No thyroid mass.     Trachea: No tracheal deviation.  Cardiovascular:     Rate and Rhythm: Normal rate and regular rhythm.     Heart sounds: Normal heart sounds. No murmur heard.   No gallop.  Pulmonary:     Effort: Pulmonary effort is normal. No respiratory distress.     Breath sounds: Normal breath sounds. No stridor. No decreased breath sounds, wheezing, rhonchi or rales.  Abdominal:     General: There is no  distension.     Palpations: Abdomen is soft.     Tenderness: There is no abdominal tenderness. There is no rebound.  Musculoskeletal:        General: No tenderness. Normal range of motion.     Cervical back: Normal range of motion and neck supple.  Skin:    General: Skin is warm and dry.     Findings: No abrasion or rash.  Neurological:     Mental Status: He is alert. Mental status is at baseline. He is confused.     GCS: GCS eye subscore is 4. GCS verbal subscore is 5. GCS motor subscore is 6.     Cranial Nerves: Cranial nerves are intact. No cranial nerve deficit.     Sensory: No sensory deficit.     Motor: No tremor.  Psychiatric:        Attention and Perception: He is inattentive.    ED Results / Procedures / Treatments   Labs (all labs ordered are listed, but only abnormal results are displayed) Labs Reviewed  CULTURE, BLOOD (ROUTINE X 2)  CULTURE, BLOOD (ROUTINE X 2)  CBC WITH DIFFERENTIAL/PLATELET  BASIC METABOLIC PANEL  URINALYSIS, ROUTINE W REFLEX MICROSCOPIC  LACTIC ACID, PLASMA    EKG EKG Interpretation  Date/Time:  Wednesday April 20 2021 13:47:34 EDT Ventricular Rate:  43 PR Interval:    QRS Duration: 112 QT Interval:  534 QTC Calculation: 483 R Axis:   28 Text Interpretation: Atrial fibrillation Anteroseptal infarct, age indeterminate Confirmed by Lorre Nick ([id number]) on 04/20/2021 1:52:38 PM  Radiology No results found.  Procedures Procedures   Medications Ordered in ED Medications  0.9 %  sodium chloride infusion (has no administration in time range)    ED Course  I have reviewed the triage vital signs and the nursing notes.  Pertinent labs & imaging results that were available during my care of the patient were reviewed by me and considered in my medical decision making (see chart for details).    MDM Rules/Calculators/A&P                          Patient with periods of bradycardia but blood pressures been stable.  He does have a  history of bradycardia in the past. Patient with evidence of pneumonia on chest x-ray.  Has evidence also AKI.  Started on IV antibiotics and will be admitted to the hospital service. Final Clinical Impression(s) / ED Diagnoses Final diagnoses:  None    Rx / DC Orders ED Discharge Orders     None        Lorre Nick, MD 04/20/21 1545

## 2021-04-20 NOTE — ED Triage Notes (Signed)
"  Daughter visited his care facility and was upset that the facility was not taking care of him, he was cold and had no blanket. Staff stated he was diagnosed with pna 2 days ago and was being treated with oral antibiotics via feeding tube, his mental state is within normal limits, the daughter just said he was cold and was unhappy and requested EMS be called" per EMS

## 2021-04-21 ENCOUNTER — Other Ambulatory Visit: Payer: Self-pay

## 2021-04-21 DIAGNOSIS — R6520 Severe sepsis without septic shock: Secondary | ICD-10-CM | POA: Diagnosis not present

## 2021-04-21 DIAGNOSIS — A419 Sepsis, unspecified organism: Secondary | ICD-10-CM | POA: Diagnosis not present

## 2021-04-21 LAB — COMPREHENSIVE METABOLIC PANEL
ALT: 45 U/L — ABNORMAL HIGH (ref 0–44)
AST: 32 U/L (ref 15–41)
Albumin: 3.1 g/dL — ABNORMAL LOW (ref 3.5–5.0)
Alkaline Phosphatase: 77 U/L (ref 38–126)
Anion gap: 6 (ref 5–15)
BUN: 48 mg/dL — ABNORMAL HIGH (ref 8–23)
CO2: 25 mmol/L (ref 22–32)
Calcium: 9.7 mg/dL (ref 8.9–10.3)
Chloride: 109 mmol/L (ref 98–111)
Creatinine, Ser: 1.91 mg/dL — ABNORMAL HIGH (ref 0.61–1.24)
GFR, Estimated: 36 mL/min — ABNORMAL LOW (ref >60)
Glucose, Bld: 71 mg/dL (ref 70–99)
Potassium: 6.1 mmol/L — ABNORMAL HIGH (ref 3.5–5.1)
Sodium: 140 mmol/L (ref 135–145)
Total Bilirubin: 0.4 mg/dL (ref 0.3–1.2)
Total Protein: 7.3 g/dL (ref 6.5–8.1)

## 2021-04-21 LAB — CBC
HCT: 30.7 % — ABNORMAL LOW (ref 39.0–52.0)
Hemoglobin: 10.1 g/dL — ABNORMAL LOW (ref 13.0–17.0)
MCH: 29.4 pg (ref 26.0–34.0)
MCHC: 32.9 g/dL (ref 30.0–36.0)
MCV: 89.5 fL (ref 80.0–100.0)
Platelets: 92 10*3/uL — ABNORMAL LOW (ref 150–400)
RBC: 3.43 MIL/uL — ABNORMAL LOW (ref 4.22–5.81)
RDW: 13.2 % (ref 11.5–15.5)
WBC: 2.8 10*3/uL — ABNORMAL LOW (ref 4.0–10.5)
nRBC: 0.7 % — ABNORMAL HIGH (ref 0.0–0.2)

## 2021-04-21 LAB — PROCALCITONIN: Procalcitonin: <0.1 ng/mL

## 2021-04-21 LAB — GLUCOSE, CAPILLARY
Glucose-Capillary: 108 mg/dL — ABNORMAL HIGH (ref 70–99)
Glucose-Capillary: 144 mg/dL — ABNORMAL HIGH (ref 70–99)
Glucose-Capillary: 54 mg/dL — ABNORMAL LOW (ref 70–99)
Glucose-Capillary: 64 mg/dL — ABNORMAL LOW (ref 70–99)
Glucose-Capillary: 66 mg/dL — ABNORMAL LOW (ref 70–99)
Glucose-Capillary: 72 mg/dL (ref 70–99)
Glucose-Capillary: 73 mg/dL (ref 70–99)
Glucose-Capillary: 81 mg/dL (ref 70–99)

## 2021-04-21 LAB — CORTISOL-AM, BLOOD: Cortisol - AM: 14.5 ug/dL (ref 6.7–22.6)

## 2021-04-21 MED ORDER — DEXTROSE 50 % IV SOLN: 12.5000 g | INTRAVENOUS | Status: AC

## 2021-04-21 MED ORDER — DEXTROSE 5 % IV SOLN: INTRAVENOUS | Status: DC

## 2021-04-21 MED ORDER — FREE WATER
200.0000 mL | Status: DC
  Administered 2021-04-21 – 2021-04-26 (×32): 200 mL

## 2021-04-21 MED ORDER — JEVITY 1.5 CAL/FIBER PO LIQD
1000.0000 mL | ORAL | Status: DC
  Administered 2021-04-21 – 2021-04-26 (×4): 1000 mL
  Filled 2021-04-21 (×9): qty 1000

## 2021-04-21 MED ORDER — ORAL CARE MOUTH RINSE
15.0000 mL | Freq: Two times a day (BID) | OROMUCOSAL | Status: DC
  Administered 2021-04-21 – 2021-04-26 (×11): 15 mL via OROMUCOSAL

## 2021-04-21 MED ORDER — PROSOURCE TF PO LIQD
45.0000 mL | Freq: Two times a day (BID) | ORAL | Status: DC
  Administered 2021-04-21 – 2021-04-26 (×11): 45 mL
  Filled 2021-04-21 (×11): qty 45

## 2021-04-21 MED ORDER — DEXTROSE 50 % IV SOLN
12.5000 g | INTRAVENOUS | Status: AC
  Administered 2021-04-21: 12.5 g via INTRAVENOUS
  Filled 2021-04-21: qty 50

## 2021-04-21 MED ORDER — DEXTROSE 10 % IV SOLN: INTRAVENOUS | Status: DC

## 2021-04-21 MED ORDER — CHLORHEXIDINE GLUCONATE 0.12 % MT SOLN
15.0000 mL | Freq: Two times a day (BID) | OROMUCOSAL | Status: DC
  Administered 2021-04-21 – 2021-04-26 (×10): 15 mL via OROMUCOSAL
  Filled 2021-04-21 (×8): qty 15

## 2021-04-21 NOTE — Progress Notes (Signed)
PROGRESS NOTE  Jack Rivas  DOB: [birth year]  PCP: Patient, No Pcp Per (Inactive) MHD:[id number]  DOA: 04/20/2021  LOS: 1 day  Hospital Day: 2  Chief Complaint: Decreased level of consciousness  Brief narrative: Jack Rivas is a 75 y.o. male with PMH significant for HTN, dementia, dysphagia for which he gets PEG tube feeding.  He is a nursing home resident and at baseline interactive, takes p.o. nectar thick liquid and participating rehab with maximal assistance.  Per report, few days ago he was diagnosed with pneumonia and was started on Levaquin through PEG tube.  His respiratory status was stable but confusion persisted and hence he was sent to the ED per family's request.  In the ED, patient was bradycardic with heart rate in 40s.  His temperature was noted to be as low as 91.4. Labs with creatinine elevated to 2, WBC at 3.3 Chest x-ray with evidence of right lower lobe pneumonia Admitted to hospital service for sepsis secondary to pneumonia. See below for details.  Subjective: Patient was seen and examined this morning.  Elderly African-American male.  Alert, awake, demented, moving his extremities.  Unable to have a conversation.  Physically strong.  Kicking staff.  Tube feeding ongoing.  Earlier this morning while on tube feeding, his blood sugar level was low down to 54.  No family at bedside.  Assessment/Plan: Severe sepsis secondary to healthcare associated pneumonia/aspiration pneumonia -Meets criteria with leukopenia, hypothermia, elevated creatinine.  Lactic acid level normal. -In the SNF, patient was started on Levaquin.  Currently on IV cefepime.  Acute metabolic encephalopathy Dementia with behavioral disturbances -Per family, at baseline, patient is interactive.  Currently completely altered, restless, unable to have a conversation.  Continue to monitor mental status change.  Expect improvement with antibiotics. -As needed IV sedatives to continue  Chronic sinus  bradycardia -Patient reportedly had history of bradycardia.  Unclear etiology. -Continue to monitor in telemetry for abnormal pauses.  Mobility: Unclear baseline mobility status Code Status:   Code Status: Full Code  Nutritional status: Body mass index is 23.12 kg/m.     Diet:  Diet Order             Diet NPO time specified  Diet effective now                  DVT prophylaxis:  enoxaparin (LOVENOX) injection 40 mg Start: 04/21/21 1000   Antimicrobials: IV cefepime Fluid: D5W at 75 mill per hour.  Tube feeding Consultants: None Family Communication: None at bedside  Status is: Patient  Remains inpatient appropriate because: Remains altered.  Dispo: The patient is from: Nursing facility              Anticipated d/c is to: Back to nursing facility              Patient currently is not medically stable to d/c.   Difficult to place patient No     Infusions:   sodium chloride Stopped (04/20/21 2300)   ceFEPime (MAXIPIME) IV 2 g (04/21/21 0540)   dextrose 75 mL/hr at 04/21/21 0835   feeding supplement (OSMOLITE 1.2 CAL) 50 mL/hr at 04/21/21 0430   lactated ringers 100 mL/hr at 04/20/21 2346    Scheduled Meds:  chlorhexidine  15 mL Mouth Rinse BID   enoxaparin (LOVENOX) injection  40 mg Subcutaneous Q24H   mouth rinse  15 mL Mouth Rinse q12n4p   sodium chloride flush  3 mL Intravenous Q12H    Antimicrobials: Anti-infectives (From admission,  onward)    Start     Dose/Rate Route Frequency Ordered Stop   04/20/21 1800  ceFEPIme (MAXIPIME) 2 g in sodium chloride 0.9 % 100 mL IVPB        2 g 200 mL/hr over 30 Minutes Intravenous Every 12 hours 04/20/21 1558     04/20/21 1545  cefTRIAXone (ROCEPHIN) 1 g in sodium chloride 0.9 % 100 mL IVPB  Status:  Discontinued        1 g 200 mL/hr over 30 Minutes Intravenous  Once 04/20/21 1532 04/20/21 1548   04/20/21 1545  azithromycin (ZITHROMAX) 500 mg in sodium chloride 0.9 % 250 mL IVPB  Status:  Discontinued        500  mg 250 mL/hr over 60 Minutes Intravenous  Once 04/20/21 1532 04/20/21 1548       PRN meds: acetaminophen **OR** acetaminophen, LORazepam, ondansetron **OR** ondansetron (ZOFRAN) IV   Objective: Vitals:   04/21/21 1100 04/21/21 1354  BP:  123/74  Pulse:  74  Resp:  15  Temp: (!) 100.7 F (38.2 C)   SpO2:      Intake/Output Summary (Last 24 hours) at 04/21/2021 1405 Last data filed at 04/21/2021 0600 Gross per 24 hour  Intake 1281.57 ml  Output 550 ml  Net 731.57 ml   Filed Weights   04/20/21 1316  Weight: 83.9 kg   Weight change:  Body mass index is 23.12 kg/m.   Physical Exam: General exam: Elderly African-American male.  Does not seem to be in pain Skin: No rashes, lesions or ulcers. HEENT: Atraumatic, normocephalic, no obvious bleeding Lungs: Clear to auscultation bilaterally CVS: Bradycardic, no murmur GI/Abd soft, nontender, nondistended, bowel sound present.  PEG tube site intact CNS: Alert, awake restless, unable to have a conversation Psychiatry: Altered Extremities: No pedal edema, no calf tenderness  Data Review: I have personally reviewed the laboratory data and studies available.  Recent Labs  Lab 04/20/21 1355 04/20/21 2315 04/21/21 0453  WBC 3.3* 2.8* 2.8*  NEUTROABS 1.4*  --   --   HGB 10.1* 10.8* 10.1*  HCT 32.2* 33.8* 30.7*  MCV 92.5 91.6 89.5  PLT 89* 88* 92*   Recent Labs  Lab 04/20/21 1355 04/20/21 2315 04/21/21 0453  NA 145  --  140  K 5.6*  --  6.1*  CL 107  --  109  CO2 28  --  25  GLUCOSE 119*  --  71  BUN 54*  --  48*  CREATININE 2.01* 1.87* 1.91*  CALCIUM 9.7  --  9.7    F/u labs ordered Unresulted Labs (From admission, onward)     Start     Ordered   04/27/21 0500  Creatinine, serum  (enoxaparin (LOVENOX)    CrCl < 30 ml/min)  Weekly,   R     Comments: while on enoxaparin therapy.    04/20/21 2235   04/22/21 0500  CBC with Differential/Platelet  Daily,   R      04/21/21 1405   04/22/21 0500  Basic metabolic  panel  Daily,   R      04/21/21 1405            Signed, Lorin Glass, MD [facility] 04/21/2021

## 2021-04-21 NOTE — Progress Notes (Signed)
PHARMACY NOTE -  Cefepim  Pharmacy has been assisting with dosing of cefepime for PNA. Dosage remains stable at 2g IV q12 hr and further renal adjustments per institutional Pharmacy antibiotic protocol  Pharmacy will sign off, following peripherally for culture results or dose adjustments. Please reconsult if a change in clinical status warrants re-evaluation of dosage.  Bernadene Person, PharmD, BCPS [phone] 04/21/2021, 12:31 PM

## 2021-04-21 NOTE — Progress Notes (Addendum)
Initial Nutrition Assessment  DOCUMENTATION CODES:   Non-severe (moderate) malnutrition in context of chronic illness  INTERVENTION:   Continue enteral feeding via PEG: Jevity 1.5 @ 65 mL/hr (1560 mL/day) 45 mL ProSource TF - BID 200 mL water flush q4h  Provides 2420 kcal, 122 gm PRO, 1186 mL free water (2386 mL total with water flushes)  Start TF at 35 mL/hr and advance rate by 10 mL q8h until goal is met   NUTRITION DIAGNOSIS:   Moderate Malnutrition related to chronic illness as evidenced by mild muscle depletion, mild fat depletion, percent weight loss.  GOAL:   Patient will meet greater than or equal to 90% of their needs   MONITOR:   TF tolerance, Weight trends, Labs  REASON FOR ASSESSMENT:   Consult Enteral/tube feeding initiation and management  ASSESSMENT:   75 y.o. male presented to the ED with decreased consciousness from nursing facility. PMH of dementia, dysphagia w/ PEG, CKD III, HTN, and malnutrition. Pt admitted with sepsis caused from healthcare associated pneumonia/aspiration pneumonia.   Pt unable to provide nutrition related information at this time due to decreased mentation. No family present at bedside to provide information.   Per EMR, pt has lost 10.2% of his body weight in ~1 month.   Unsure what formula tube feed was hanging at bedside due to formula poured into bag and not labeled. Running at 40 mL/hr.   Pt was previously hospitalized and followed by RD and placed on Osmolite 1.5.   Medications reviewed and include:  IV antibiotics Labs reviewed: Potassium 6.1, BUN 48, 24 hr BG trends 54-144 mg/dL   NUTRITION - FOCUSED PHYSICAL EXAM:  Exam performed but was limited due to pt mentation and being combative.   Flowsheet Row Most Recent Value  Orbital Region Mild depletion  Upper Arm Region No depletion  Thoracic and Lumbar Region No depletion  Buccal Region Mild depletion  Temple Region Moderate depletion  Clavicle Bone Region Mild  depletion  Clavicle and Acromion Bone Region Mild depletion  Scapular Bone Region Unable to assess  Dorsal Hand Unable to assess  [pt in mitts]  Patellar Region Mild depletion  Anterior Thigh Region Mild depletion  Posterior Calf Region Mild depletion  Edema (RD Assessment) None  Hair Reviewed  Eyes Reviewed  Mouth Reviewed  Skin Reviewed  Nails Reviewed       Diet Order:   Diet Order             Diet NPO time specified  Diet effective now                   EDUCATION NEEDS:   No education needs have been identified at this time  Skin:  Skin Assessment: Reviewed RN Assessment  Last BM:  04/20/2021 - Type 6  Height:   Ht Readings from Last 1 Encounters:  04/20/21 _0  (1.905 m)    Weight:   Wt Readings from Last 1 Encounters:  04/20/21 83.9 kg    Ideal Body Weight:     BMI:  Body mass index is 23.12 kg/m.  Estimated Nutritional Needs:   Kcal:  5427-0623  Protein:  115-130 grams  Fluid:  >/= 2.3 L    Zaccai Chavarin BS, PLDN Clinical Dietitian See [facility] for contact information.

## 2021-04-21 NOTE — Progress Notes (Signed)
The follow-up CBG was 144. Vitals: 92.3 F (Rectal);HR 66;RR 12;139/144 (MAP 122); 100% Room Air.  The PCP on call came to the patient's room to see the patient again.

## 2021-04-21 NOTE — Progress Notes (Signed)
Pt daughter called and updated about pt condition.

## 2021-04-21 NOTE — Progress Notes (Signed)
   04/21/21 1314  Provider Notification  Provider Name/Title Lorin Glass MD  Date Provider Notified 04/21/21  Time Provider Notified 1342  Notification Type Page  Notification Reason Other (Comment) [facility])  Provider response See new orders  Date of Provider Response 04/21/21

## 2021-04-21 NOTE — Progress Notes (Signed)
   04/21/21 0744  Provider Notification  Provider Name/Title Lorin Glass MB  Date Provider Notified 04/21/21  Time Provider Notified 0745  Notification Type Page  Notification Reason Other (Comment) (CBG - 13)  Provider response See new orders  Date of Provider Response 04/21/21  Time of Provider Response [phone]

## 2021-04-21 NOTE — Progress Notes (Signed)
Pt agitated, combative and resistive with care, PRN ativan given.

## 2021-04-21 NOTE — Plan of Care (Signed)
  Problem: Clinical Measurements: Goal: Respiratory complications will improve Outcome: Progressing   Problem: Nutrition: Goal: Adequate nutrition will be maintained Outcome: Progressing   

## 2021-04-21 NOTE — Progress Notes (Signed)
Staff are continuing to closely monitor this patient. Warm Blankets and Heat Packs are being used to help heat the patient.  RN/CN in close contact with Rapid Response Nurse and PCP on call.  Blood Glucose Reading was 66. Will treat patient following the HYPOGLYCEMIC protocol. PCP was notified.

## 2021-04-22 DIAGNOSIS — A419 Sepsis, unspecified organism: Secondary | ICD-10-CM | POA: Diagnosis not present

## 2021-04-22 DIAGNOSIS — R6520 Severe sepsis without septic shock: Secondary | ICD-10-CM | POA: Diagnosis not present

## 2021-04-22 LAB — CBC WITH DIFFERENTIAL/PLATELET
Abs Immature Granulocytes: 0.02 10*3/uL (ref 0.00–0.07)
Basophils Absolute: 0 10*3/uL (ref 0.0–0.1)
Basophils Relative: 0 %
Eosinophils Absolute: 0.1 10*3/uL (ref 0.0–0.5)
Eosinophils Relative: 2 %
HCT: 30.1 % — ABNORMAL LOW (ref 39.0–52.0)
Hemoglobin: 10.1 g/dL — ABNORMAL LOW (ref 13.0–17.0)
Immature Granulocytes: 0 %
Lymphocytes Relative: 21 %
Lymphs Abs: 1.3 10*3/uL (ref 0.7–4.0)
MCH: 30.1 pg (ref 26.0–34.0)
MCHC: 33.6 g/dL (ref 30.0–36.0)
MCV: 89.6 fL (ref 80.0–100.0)
Monocytes Absolute: 0.4 10*3/uL (ref 0.1–1.0)
Monocytes Relative: 6 %
Neutro Abs: 4.3 10*3/uL (ref 1.7–7.7)
Neutrophils Relative %: 71 %
Platelets: 94 10*3/uL — ABNORMAL LOW (ref 150–400)
RBC: 3.36 MIL/uL — ABNORMAL LOW (ref 4.22–5.81)
RDW: 13.3 % (ref 11.5–15.5)
WBC: 6.1 10*3/uL (ref 4.0–10.5)
nRBC: 0 % (ref 0.0–0.2)

## 2021-04-22 LAB — GLUCOSE, CAPILLARY
Glucose-Capillary: 107 mg/dL — ABNORMAL HIGH (ref 70–99)
Glucose-Capillary: 112 mg/dL — ABNORMAL HIGH (ref 70–99)
Glucose-Capillary: 71 mg/dL (ref 70–99)
Glucose-Capillary: 86 mg/dL (ref 70–99)
Glucose-Capillary: 89 mg/dL (ref 70–99)
Glucose-Capillary: 92 mg/dL (ref 70–99)

## 2021-04-22 LAB — BASIC METABOLIC PANEL
Anion gap: 9 (ref 5–15)
BUN: 45 mg/dL — ABNORMAL HIGH (ref 8–23)
CO2: 24 mmol/L (ref 22–32)
Calcium: 9.2 mg/dL (ref 8.9–10.3)
Chloride: 106 mmol/L (ref 98–111)
Creatinine, Ser: 2.25 mg/dL — ABNORMAL HIGH (ref 0.61–1.24)
GFR, Estimated: 30 mL/min — ABNORMAL LOW (ref >60)
Glucose, Bld: 103 mg/dL — ABNORMAL HIGH (ref 70–99)
Potassium: 4.9 mmol/L (ref 3.5–5.1)
Sodium: 139 mmol/L (ref 135–145)

## 2021-04-22 MED ORDER — DEXTROSE-NACL 5-0.9 % IV SOLN: INTRAVENOUS | Status: DC

## 2021-04-22 NOTE — Care Management Important Message (Signed)
Important Message  Patient Details IM Letter given to the Patient Name: Jack Rivas MRN: [id number] Date of Birth: [birth year]   Medicare Important Message Given:  Yes     Caren Macadam 04/22/2021, 12:13 PM

## 2021-04-22 NOTE — Evaluation (Signed)
Physical Therapy Evaluation Patient Details Name: Jack Rivas MRN: [id number] DOB: 1945-08-05 Today's Date: 04/22/2021  History of Present Illness  Pt is a 75 y.o. male with AMS, admitted for severe sepsis secondary to healthcare associated pneumonia/aspiration pneumonia.  PMH significant for HTN, dementia, dysphagia for which he receives PEG tube feeding.  Clinical Impression  Pt is a 75 y.o. male with above HPI. Pt speech mostly mumbled, at times able to understand "how do you?" And "I'm going to fight everybody." Pt required Total A +2 for bed mobility today and with increasing agitation/ aggression with mobility and when therapist attempting to remove mits for session. Per EMR, pt has recent admission where he was able to perform sit to stand transfers with +2 assist and using stedy with physical therapy team. No family/caregiver present to confirm PLOF, attempted to reach pt's daughter via phone but no answer. Recommend return to SNF.  Will attempt trial of skilled PT services during stay if pt is able to participate.      Recommendations for follow up therapy are one component of a multi-disciplinary discharge planning process, led by the attending physician.  Recommendations may be updated based on patient status, additional functional criteria and insurance authorization.  Follow Up Recommendations SNF    Equipment Recommendations  None recommended by PT    Recommendations for Other Services       Precautions / Restrictions Precautions Precautions: Fall Restrictions Weight Bearing Restrictions: No      Mobility  Bed Mobility Overal bed mobility: Needs Assistance Bed Mobility: Supine to Sit;Sit to Supine     Supine to sit: +2 for physical assistance;+2 for safety/equipment;HOB elevated;Total assist Sit to supine: Total assist;+2 for physical assistance;+2 for safety/equipment;HOB elevated   General bed mobility comments: Therapist provided verbal cues and then tactile  cuing for initiation to bring LEs toward EOB, pt guarding with rigid LEs initially. Pt with poor ability to follow commands requiring Total A +2 for supine to sit with use of chuck pad to assist with bringing hips to EOB and trunk to upright. Pt intermittently agressive/combative swinging fists and scrathing near IV site so mits left on while EOB. Pt initially with significant posterior lean/resisting requiring MAX A to maintain upright seated balance. Therapist attempted to remove mits to assess pt's ability to balance with use of UE support, when pt seeming calmed down while sitting EOB. Pt became agitated and repeating "no, no" and stating "I'm going to fight everybody", pt's mitts left on to decrease agitation.  Noted improved balace with prolonged sitting and intermittent cuing at posterior shoulders for forward weight shifting progressing to MOD A to maintain. Total A +2 for sit to supine and scooting to [facility] for repositioning, noted pt's LEs becoming rigid with attempts to bring up onto EOB, transfers deferred this date to maintain safety due to increased agitation with attempts to mobilize and resistance/guarding against therapist and tech during mobility.    Transfers                 General transfer comment: deferred to maintain pt, therapist, and tech safety.  Ambulation/Gait                Stairs            Wheelchair Mobility    Modified Rankin (Stroke Patients Only)       Balance Overall balance assessment: Needs assistance Sitting-balance support: Feet supported Sitting balance-Leahy Scale: Poor Sitting balance - Comments: MOD-MAX A to maintain  Pertinent Vitals/Pain Pain Assessment: Faces Faces Pain Scale: No hurt Pain Location: unable to assess due to pt's expressive difficulties and inability to follow commands. Pt seeming in no acute distress/pain during session.    Home Living Family/patient  expects to be discharged to:: Skilled nursing facility                      Prior Function           Comments: no family present to confirm pt's PLOF, attempted to contact pt's daughter via phone but received voicemail. Per EMr pt has had multiple hospitalizations this year and extensive hospitalization beginning of yr.     Hand Dominance        Extremity/Trunk Assessment   Upper Extremity Assessment Upper Extremity Assessment: Overall WFL for tasks assessed    Lower Extremity Assessment Lower Extremity Assessment: Difficult to assess due to impaired cognition    Cervical / Trunk Assessment Cervical / Trunk Assessment: Normal  Communication   Communication: Expressive difficulties  Cognition Arousal/Alertness: Awake/alert Behavior During Therapy: Agitated;Impulsive Overall Cognitive Status: History of cognitive impairments - at baseline                                 General Comments: pt with history of dementia      General Comments      Exercises     Assessment/Plan    PT Assessment Patient needs continued PT services  PT Problem List Decreased strength;Decreased activity tolerance;Decreased balance;Decreased mobility;Decreased safety awareness       PT Treatment Interventions Gait training;Functional mobility training;Therapeutic activities;Therapeutic exercise;Balance training;Patient/family education    PT Goals (Current goals can be found in the Care Plan section)  Acute Rehab PT Goals Patient Stated Goal: pt unable to verbalize- no family present/available PT Goal Formulation: Patient unable to participate in goal setting Time For Goal Achievement: 05/06/21 Potential to Achieve Goals: Fair    Frequency Min 2X/week   Barriers to discharge        Co-evaluation               AM-PAC PT "6 Clicks" Mobility  Outcome Measure Help needed turning from your back to your side while in a flat bed without using bedrails?: A  Lot Help needed moving from lying on your back to sitting on the side of a flat bed without using bedrails?: Total Help needed moving to and from a bed to a chair (including a wheelchair)?: Total Help needed standing up from a chair using your arms (e.g., wheelchair or bedside chair)?: Total Help needed to walk in hospital room?: Total Help needed climbing 3-5 steps with a railing? : Total 6 Click Score: 7    End of Session   Activity Tolerance: Patient tolerated treatment well Patient left: in bed;with call bell/phone within reach;with bed alarm set Nurse Communication: Mobility status PT Visit Diagnosis: Other abnormalities of gait and mobility (R26.89)    Time: 0102-7253 PT Time Calculation (min) (ACUTE ONLY): 12 min   Charges:   PT Evaluation $PT Eval Low Complexity: 1 Low          Lyman Speller PT, DPT  Acute Rehabilitation Services  Office [phone]  04/22/2021, 11:50 AM

## 2021-04-22 NOTE — Progress Notes (Signed)
PROGRESS NOTE  Jack Rivas  DOB: [birth year]  PCP: Patient, No Pcp Per (Inactive) [id number]  DOA: 04/20/2021  LOS: 2 days  Hospital Day: 3  Chief Complaint: Decreased level of consciousness  Brief narrative: Jack Rivas is a 75 y.o. male with PMH significant for HTN, dementia, dysphagia for which he gets PEG tube feeding.  He is a nursing home resident and at baseline interactive, takes p.o. nectar thick liquid and participating rehab with maximal assistance.  Per report, few days ago he was diagnosed with pneumonia and was started on Levaquin through PEG tube.  His respiratory status was stable but confusion persisted and hence he was sent to the ED per family's request.  In the ED, patient was bradycardic with heart rate in 40s.  His temperature was noted to be as low as 91.4. Labs with creatinine elevated to 2, WBC at 3.3 Chest x-ray with evidence of right lower lobe pneumonia Admitted to hospital service for sepsis secondary to pneumonia. See below for details.  Subjective: Patient was seen and examined this morning.  Elderly African-American male.  Alert, awake, mumbling.  Not in pain.  Unable to have a conversation or follow commands.  Assessment/Plan: Severe sepsis secondary to healthcare associated pneumonia/aspiration pneumonia -Met criteria with leukopenia, hypothermia, elevated creatinine.  Lactic acid level normal. -In the SNF, patient was started on Levaquin which was switched to IV cefepime on admission.  AKI on CKD 3B -Baseline creatinine less than 1.7.  Presented with a creatinine elevated 2.01 which is gradually improving but worse again to 2.25 this morning.  I switched his fluid to D5 NS at 75 mill per hour. Recent Labs    03/19/21 2209 03/20/21 0709 03/21/21 0420 03/22/21 0236 03/23/21 0248 03/24/21 0850 03/26/21 0659 04/20/21 1355 04/20/21 2315 04/21/21 0453 04/22/21 0454  BUN 52* 47* 41* 37* 36* 40* 40* 54*  --  48* 45*  CREATININE 2.07* 1.88*  1.74* 1.66* 1.66* 1.77* 1.61* 2.01* 1.87* 1.91* 5.18*   Acute metabolic encephalopathy Dementia with behavioral disturbances -Per family, at baseline, patient is interactive.  Currently patient remains completely altered, restless, unable to have a conversation. Continue to monitor mental status change.  Hopefully it improves with antibiotics and hydration. -As needed IV sedatives to continue  Hypoglycemia -No history of diabetes.   -Blood sugar level was low in 50s yesterday.  Improved after dextrose drip was initiated.  Also on PEG tube feeding.  Continue to monitor. Recent Labs  Lab 04/21/21 2050 04/22/21 0055 04/22/21 0453 04/22/21 0806 04/22/21 1203  GLUCAP 108* 112* 89 107* 86   Chronic sinus bradycardia -Patient reportedly had history of bradycardia.  Unclear etiology. -Continue to monitor in telemetry for abnormal pauses.  Mobility: Unclear baseline mobility status Code Status:   Code Status: Full Code  Nutritional status: Body mass index is 23.34 kg/m. Nutrition Problem: Moderate Malnutrition Etiology: chronic illness Signs/Symptoms: mild muscle depletion, mild fat depletion, percent weight loss Percent weight loss: 10.2 % Diet:  Diet Order             Diet NPO time specified  Diet effective now                  DVT prophylaxis:  enoxaparin (LOVENOX) injection 40 mg Start: 04/21/21 1000   Antimicrobials: IV cefepime Fluid: D5 NS at 75 mill per hour.  Tube feeding Consultants: None Family Communication: Called and updated patient's daughter Ms. [name] at this afternoon.  Status is: Patient  Remains inpatient appropriate because: Remains altered,  continues to need antibiotics, IV fluids.  Dispo: The patient is from: Nursing facility              Anticipated d/c is to: Back to nursing facility              Patient currently is not medically stable to d/c.   Difficult to place patient No     Infusions:   sodium chloride Stopped (04/20/21 2300)    ceFEPime (MAXIPIME) IV 2 g (04/22/21 0615)   dextrose 5 % and 0.9% NaCl 75 mL/hr at 04/22/21 0921   feeding supplement (JEVITY 1.5 CAL/FIBER) 65 mL/hr at 04/22/21 0107    Scheduled Meds:  chlorhexidine  15 mL Mouth Rinse BID   enoxaparin (LOVENOX) injection  40 mg Subcutaneous Q24H   feeding supplement (PROSource TF)  45 mL Per Tube BID   free water  200 mL Per Tube Q4H   mouth rinse  15 mL Mouth Rinse q12n4p   sodium chloride flush  3 mL Intravenous Q12H    Antimicrobials: Anti-infectives (From admission, onward)    Start     Dose/Rate Route Frequency Ordered Stop   04/20/21 1800  ceFEPIme (MAXIPIME) 2 g in sodium chloride 0.9 % 100 mL IVPB        2 g 200 mL/hr over 30 Minutes Intravenous Every 12 hours 04/20/21 1558     04/20/21 1545  cefTRIAXone (ROCEPHIN) 1 g in sodium chloride 0.9 % 100 mL IVPB  Status:  Discontinued        1 g 200 mL/hr over 30 Minutes Intravenous  Once 04/20/21 1532 04/20/21 1548   04/20/21 1545  azithromycin (ZITHROMAX) 500 mg in sodium chloride 0.9 % 250 mL IVPB  Status:  Discontinued        500 mg 250 mL/hr over 60 Minutes Intravenous  Once 04/20/21 1532 04/20/21 1548       PRN meds: acetaminophen **OR** acetaminophen, LORazepam, ondansetron **OR** ondansetron (ZOFRAN) IV   Objective: Vitals:   04/22/21 0100 04/22/21 0617  BP: 127/77 130/69  Pulse: 69 83  Resp: 14 16  Temp: 97.8 F (36.6 C) 99.2 F (37.3 C)  SpO2: 99% 99%    Intake/Output Summary (Last 24 hours) at 04/22/2021 1316 Last data filed at 04/22/2021 0923 Gross per 24 hour  Intake 926.65 ml  Output 2550 ml  Net -1623.35 ml   Filed Weights   04/20/21 1316 04/22/21 0716  Weight: 83.9 kg 84.7 kg   Weight change:  Body mass index is 23.34 kg/m.   Physical Exam: General exam: Elderly African-American male.  Alert awake but not able to follow command.  Not in pain or distress. Skin: No rashes, lesions or ulcers. HEENT: Atraumatic, normocephalic, no obvious bleeding Lungs:  Clear to auscultation bilaterally CVS: Bradycardic, no murmur GI/Abd soft, nontender, nondistended, bowel sound present.  PEG tube site intact CNS: Alert, awake restless, unable to have a conversation.  Not able to follow command Psychiatry: Altered Extremities: No pedal edema, no calf tenderness  Data Review: I have personally reviewed the laboratory data and studies available.  Recent Labs  Lab 04/20/21 1355 04/20/21 2315 04/21/21 0453 04/22/21 0454  WBC 3.3* 2.8* 2.8* 6.1  NEUTROABS 1.4*  --   --  4.3  HGB 10.1* 10.8* 10.1* 10.1*  HCT 32.2* 33.8* 30.7* 30.1*  MCV 92.5 91.6 89.5 89.6  PLT 89* 88* 92* 94*   Recent Labs  Lab 04/20/21 1355 04/20/21 2315 04/21/21 0453 04/22/21 0454  NA 145  --  140 139  K 5.6*  --  6.1* 4.9  CL 107  --  109 106  CO2 28  --  25 24  GLUCOSE 119*  --  71 103*  BUN 54*  --  48* 45*  CREATININE 2.01* 1.87* 1.91* 2.25*  CALCIUM 9.7  --  9.7 9.2    F/u labs ordered Unresulted Labs (From admission, onward)     Start     Ordered   04/27/21 0500  Creatinine, serum  (enoxaparin (LOVENOX)    CrCl < 30 ml/min)  Weekly,   R     Comments: while on enoxaparin therapy.    04/20/21 2235   04/22/21 0500  CBC with Differential/Platelet  Daily,   R      04/21/21 1405   04/22/21 8406  Basic metabolic panel  Daily,   R      04/21/21 1405            Signed, Terrilee Croak, MD [facility] 04/22/2021

## 2021-04-22 NOTE — Plan of Care (Signed)
  Problem: Clinical Measurements: Goal: Will remain free from infection Outcome: Progressing Goal: Respiratory complications will improve Outcome: Progressing   Problem: Safety: Goal: Ability to remain free from injury will improve Outcome: Progressing   Problem: Activity: Goal: Risk for activity intolerance will decrease Outcome: Not Progressing

## 2021-04-23 DIAGNOSIS — R6520 Severe sepsis without septic shock: Secondary | ICD-10-CM | POA: Diagnosis not present

## 2021-04-23 DIAGNOSIS — A419 Sepsis, unspecified organism: Secondary | ICD-10-CM | POA: Diagnosis not present

## 2021-04-23 LAB — CBC WITH DIFFERENTIAL/PLATELET
Abs Immature Granulocytes: 0.01 10*3/uL (ref 0.00–0.07)
Basophils Absolute: 0 10*3/uL (ref 0.0–0.1)
Basophils Relative: 0 %
Eosinophils Absolute: 0.2 10*3/uL (ref 0.0–0.5)
Eosinophils Relative: 4 %
HCT: 31.3 % — ABNORMAL LOW (ref 39.0–52.0)
Hemoglobin: 10.1 g/dL — ABNORMAL LOW (ref 13.0–17.0)
Immature Granulocytes: 0 %
Lymphocytes Relative: 31 %
Lymphs Abs: 1.6 10*3/uL (ref 0.7–4.0)
MCH: 29.4 pg (ref 26.0–34.0)
MCHC: 32.3 g/dL (ref 30.0–36.0)
MCV: 91 fL (ref 80.0–100.0)
Monocytes Absolute: 0.4 10*3/uL (ref 0.1–1.0)
Monocytes Relative: 8 %
Neutro Abs: 3 10*3/uL (ref 1.7–7.7)
Neutrophils Relative %: 57 %
Platelets: 95 10*3/uL — ABNORMAL LOW (ref 150–400)
RBC: 3.44 MIL/uL — ABNORMAL LOW (ref 4.22–5.81)
RDW: 13.5 % (ref 11.5–15.5)
WBC: 5.3 10*3/uL (ref 4.0–10.5)
nRBC: 0 % (ref 0.0–0.2)

## 2021-04-23 LAB — GLUCOSE, CAPILLARY
Glucose-Capillary: 101 mg/dL — ABNORMAL HIGH (ref 70–99)
Glucose-Capillary: 104 mg/dL — ABNORMAL HIGH (ref 70–99)
Glucose-Capillary: 122 mg/dL — ABNORMAL HIGH (ref 70–99)
Glucose-Capillary: 142 mg/dL — ABNORMAL HIGH (ref 70–99)
Glucose-Capillary: 79 mg/dL (ref 70–99)
Glucose-Capillary: 93 mg/dL (ref 70–99)

## 2021-04-23 LAB — BASIC METABOLIC PANEL
Anion gap: 8 (ref 5–15)
BUN: 45 mg/dL — ABNORMAL HIGH (ref 8–23)
CO2: 26 mmol/L (ref 22–32)
Calcium: 9.1 mg/dL (ref 8.9–10.3)
Chloride: 106 mmol/L (ref 98–111)
Creatinine, Ser: 2.37 mg/dL — ABNORMAL HIGH (ref 0.61–1.24)
GFR, Estimated: 28 mL/min — ABNORMAL LOW (ref >60)
Glucose, Bld: 97 mg/dL (ref 70–99)
Potassium: 4.9 mmol/L (ref 3.5–5.1)
Sodium: 140 mmol/L (ref 135–145)

## 2021-04-23 MED ORDER — SODIUM CHLORIDE 0.9 % IV SOLN: INTRAVENOUS | Status: DC

## 2021-04-23 NOTE — Progress Notes (Signed)
PROGRESS NOTE  Jack Rivas  DOB: January 10, 1946  PCP: Patient, No Pcp Per (Inactive) IDM:[id number]  DOA: 04/20/2021  LOS: 3 days  Hospital Day: 4  Chief Complaint: Decreased level of consciousness  Brief narrative: Jack Rivas is a 75 y.o. male with PMH significant for HTN, dementia, dysphagia for which he gets PEG tube feeding.  He is a nursing home resident and at baseline interactive, takes p.o. nectar thick liquid and participating rehab with maximal assistance.  Per report, few days ago he was diagnosed with pneumonia and was started on Levaquin through PEG tube.  His respiratory status was stable but confusion persisted and hence he was sent to the ED per family's request.  In the ED, patient was bradycardic with heart rate in 40s.  His temperature was noted to be as low as 91.4. Labs with creatinine elevated to 2, WBC at 3.3 Chest x-ray with evidence of right lower lobe pneumonia Admitted to hospital service for sepsis secondary to pneumonia. See below for details.  Subjective: Patient was seen and examined this morning.   Mumbling, alert, awake.  When I asked him how he was doing, he replied in a very muffled voice 'I don't know'.  Unable to follow motor commands.  Has mittens on both hands.  Assessment/Plan: Severe sepsis secondary to healthcare associated pneumonia/aspiration pneumonia -Met criteria on admission with leukopenia, hypothermia, elevated creatinine.  Lactic acid level normal. -In the SNF, patient was started on Levaquin which was switched to IV cefepime on admission. Recent Labs  Lab 04/20/21 1355 04/20/21 1549 04/20/21 2315 04/21/21 0453 04/22/21 0454 04/23/21 0542  WBC 3.3*  --  2.8* 2.8* 6.1 5.3  LATICACIDVEN  --  1.0  --   --   --   --   PROCALCITON  --   --  <0.10  --   --   --    AKI on CKD 3B -Baseline creatinine less than 1.7.  Presented with a creatinine elevated 2.01 which is gradually improved at first but is worsening again in the last 48  hours despite IV hydration, today creatinine is 2.27.  Continue to monitor for next 24 hours.  If no improvement, will get nephrology consultation. Recent Labs    03/20/21 0709 03/21/21 0420 03/22/21 0236 03/23/21 0248 03/24/21 0850 03/26/21 0659 04/20/21 1355 04/20/21 2315 04/21/21 0453 04/22/21 0454 04/23/21 0542  BUN 47* 41* 37* 36* 40* 40* 54*  --  48* 45* 45*  CREATININE 1.88* 1.74* 1.66* 1.66* 1.77* 1.61* 2.01* 1.87* 1.91* 2.25* 2.37*   Acute metabolic encephalopathy Dementia with behavioral disturbances -Per family, at baseline, patient is interactive.  Currently patient remains completely altered, restless, unable to have a conversation. Continue to monitor mental status change.  Hopefully it improves with antibiotics and hydration. -As needed IV sedatives to continue.  Mittens on both hands  Chronic sinus bradycardia -Patient reportedly had history of bradycardia.  Unclear etiology. -Currently normal sinus rhythm.  Continue to monitor in telemetry for abnormal pauses.  Mobility: Unclear baseline mobility status Code Status:   Code Status: Full Code  Nutritional status: Body mass index is 23.09 kg/m. Nutrition Problem: Moderate Malnutrition Etiology: chronic illness Signs/Symptoms: mild muscle depletion, mild fat depletion, percent weight loss Percent weight loss: 10.2 % Diet:  Diet Order             Diet NPO time specified  Diet effective now                  DVT prophylaxis:  enoxaparin (LOVENOX) injection 40 mg Start: 04/21/21 1000   Antimicrobials: IV cefepime Fluid: Switched IV fluid to NS at 75 mill per hour.  Ongoing tube feeding Consultants: None Family Communication: Called and updated patient's daughter Ms. [name] on 10/7.  Status is: Patient  Remains inpatient appropriate because: Remains altered, continues to need antibiotics, IV fluids.  Dispo: The patient is from: Nursing facility              Anticipated d/c is to: Back to nursing  facility              Patient currently is not medically stable to d/c.   Difficult to place patient No     Infusions:   sodium chloride Stopped (04/20/21 2300)   sodium chloride     ceFEPime (MAXIPIME) IV 2 g (04/23/21 0629)   feeding supplement (JEVITY 1.5 CAL/FIBER) 1,000 mL (04/23/21 1155)    Scheduled Meds:  chlorhexidine  15 mL Mouth Rinse BID   enoxaparin (LOVENOX) injection  40 mg Subcutaneous Q24H   feeding supplement (PROSource TF)  45 mL Per Tube BID   free water  200 mL Per Tube Q4H   mouth rinse  15 mL Mouth Rinse q12n4p   sodium chloride flush  3 mL Intravenous Q12H    Antimicrobials: Anti-infectives (From admission, onward)    Start     Dose/Rate Route Frequency Ordered Stop   04/20/21 1800  ceFEPIme (MAXIPIME) 2 g in sodium chloride 0.9 % 100 mL IVPB        2 g 200 mL/hr over 30 Minutes Intravenous Every 12 hours 04/20/21 1558     04/20/21 1545  cefTRIAXone (ROCEPHIN) 1 g in sodium chloride 0.9 % 100 mL IVPB  Status:  Discontinued        1 g 200 mL/hr over 30 Minutes Intravenous  Once 04/20/21 1532 04/20/21 1548   04/20/21 1545  azithromycin (ZITHROMAX) 500 mg in sodium chloride 0.9 % 250 mL IVPB  Status:  Discontinued        500 mg 250 mL/hr over 60 Minutes Intravenous  Once 04/20/21 1532 04/20/21 1548       PRN meds: acetaminophen **OR** acetaminophen, LORazepam, ondansetron **OR** ondansetron (ZOFRAN) IV   Objective: Vitals:   04/23/21 0526 04/23/21 1245  BP: 127/67 (!) 145/82  Pulse: 68 72  Resp: 16 13  Temp: 98.1 F (36.7 C) 98 F (36.7 C)  SpO2: 99% 100%    Intake/Output Summary (Last 24 hours) at 04/23/2021 1329 Last data filed at 04/23/2021 1246 Gross per 24 hour  Intake 1326.49 ml  Output 1550 ml  Net -223.51 ml   Filed Weights   04/20/21 1316 04/22/21 0716 04/23/21 0500  Weight: 83.9 kg 84.7 kg 83.8 kg   Weight change:  Body mass index is 23.09 kg/m.   Physical Exam: General exam: Elderly African-American male.  Alert  awake but not able to follow command.  Not in pain or distress. Skin: No rashes, lesions or ulcers. HEENT: Atraumatic, normocephalic, no obvious bleeding Lungs: Clear to auscultation bilaterally CVS: Regular rate and rhythm, no murmur, GI/Abd soft, nontender, nondistended, bowel sound present.  PEG tube site intact CNS: Alert, awake restless, unable to have a conversation.  Not able to follow command Psychiatry: Altered Extremities: No pedal edema, no calf tenderness  Data Review: I have personally reviewed the laboratory data and studies available.  Recent Labs  Lab 04/20/21 1355 04/20/21 2315 04/21/21 0453 04/22/21 0454 04/23/21 0542  WBC 3.3* 2.8* 2.8* 6.1  5.3  NEUTROABS 1.4*  --   --  4.3 3.0  HGB 10.1* 10.8* 10.1* 10.1* 10.1*  HCT 32.2* 33.8* 30.7* 30.1* 31.3*  MCV 92.5 91.6 89.5 89.6 91.0  PLT 89* 88* 92* 94* 95*   Recent Labs  Lab 04/20/21 1355 04/20/21 2315 04/21/21 0453 04/22/21 0454 04/23/21 0542  NA 145  --  140 139 140  K 5.6*  --  6.1* 4.9 4.9  CL 107  --  109 106 106  CO2 28  --  $R'25 24 26  'iv$ GLUCOSE 119*  --  71 103* 97  BUN 54*  --  48* 45* 45*  CREATININE 2.01* 1.87* 1.91* 2.25* 2.37*  CALCIUM 9.7  --  9.7 9.2 9.1    F/u labs ordered Unresulted Labs (From admission, onward)     Start     Ordered   04/27/21 0500  Creatinine, serum  (enoxaparin (LOVENOX)    CrCl < 30 ml/min)  Weekly,   R     Comments: while on enoxaparin therapy.    04/20/21 2235   04/22/21 0500  CBC with Differential/Platelet  Daily,   R      04/21/21 1405   04/22/21 5247  Basic metabolic panel  Daily,   R      04/21/21 1405            Signed, Terrilee Croak, MD [facility] 04/23/2021

## 2021-04-24 DIAGNOSIS — A419 Sepsis, unspecified organism: Secondary | ICD-10-CM | POA: Diagnosis not present

## 2021-04-24 DIAGNOSIS — R6520 Severe sepsis without septic shock: Secondary | ICD-10-CM | POA: Diagnosis not present

## 2021-04-24 LAB — GLUCOSE, CAPILLARY
Glucose-Capillary: 100 mg/dL — ABNORMAL HIGH (ref 70–99)
Glucose-Capillary: 96 mg/dL (ref 70–99)
Glucose-Capillary: 132 mg/dL — ABNORMAL HIGH (ref 70–99)
Glucose-Capillary: 99 mg/dL (ref 70–99)
Glucose-Capillary: 98 mg/dL (ref 70–99)

## 2021-04-24 LAB — CBC WITH DIFFERENTIAL/PLATELET
Abs Immature Granulocytes: 0.01 10*3/uL (ref 0.00–0.07)
Basophils Absolute: 0 10*3/uL (ref 0.0–0.1)
Basophils Relative: 0 %
Eosinophils Absolute: 0.3 10*3/uL (ref 0.0–0.5)
Eosinophils Relative: 6 %
HCT: 30.5 % — ABNORMAL LOW (ref 39.0–52.0)
Hemoglobin: 9.9 g/dL — ABNORMAL LOW (ref 13.0–17.0)
Immature Granulocytes: 0 %
Lymphocytes Relative: 31 %
Lymphs Abs: 1.5 10*3/uL (ref 0.7–4.0)
MCH: 29.5 pg (ref 26.0–34.0)
MCHC: 32.5 g/dL (ref 30.0–36.0)
MCV: 90.8 fL (ref 80.0–100.0)
Monocytes Absolute: 0.4 10*3/uL (ref 0.1–1.0)
Monocytes Relative: 9 %
Neutro Abs: 2.6 10*3/uL (ref 1.7–7.7)
Neutrophils Relative %: 54 %
Platelets: 93 10*3/uL — ABNORMAL LOW (ref 150–400)
RBC: 3.36 MIL/uL — ABNORMAL LOW (ref 4.22–5.81)
RDW: 13.3 % (ref 11.5–15.5)
WBC: 4.8 10*3/uL (ref 4.0–10.5)
nRBC: 0 % (ref 0.0–0.2)

## 2021-04-24 LAB — BASIC METABOLIC PANEL
Anion gap: 10 (ref 5–15)
BUN: 46 mg/dL — ABNORMAL HIGH (ref 8–23)
CO2: 23 mmol/L (ref 22–32)
Calcium: 9.4 mg/dL (ref 8.9–10.3)
Chloride: 112 mmol/L — ABNORMAL HIGH (ref 98–111)
Creatinine, Ser: 1.99 mg/dL — ABNORMAL HIGH (ref 0.61–1.24)
GFR, Estimated: 34 mL/min — ABNORMAL LOW (ref >60)
Glucose, Bld: 79 mg/dL (ref 70–99)
Potassium: 4.7 mmol/L (ref 3.5–5.1)
Sodium: 145 mmol/L (ref 135–145)

## 2021-04-24 NOTE — Progress Notes (Signed)
PROGRESS NOTE  Jack Rivas  DOB: May 28, 1946  PCP: Patient, No Pcp Per (Inactive) QPR:[id number]  DOA: 04/20/2021  LOS: 4 days  Hospital Day: 5  Chief Complaint: Decreased level of consciousness  Brief narrative: Jack Rivas is a 75 y.o. male with PMH significant for HTN, dementia, dysphagia for which he gets PEG tube feeding.  He is a nursing home resident and at baseline interactive, takes p.o. nectar thick liquid and participating rehab with maximal assistance.  Per report, few days ago he was diagnosed with pneumonia and was started on Levaquin through PEG tube.  His respiratory status was stable but confusion persisted and hence he was sent to the ED per family's request.  In the ED, patient was bradycardic with heart rate in 40s.  His temperature was noted to be as low as 91.4. Labs with creatinine elevated to 2, WBC at 3.3 Chest x-ray with evidence of right lower lobe pneumonia Admitted to hospital service for sepsis secondary to pneumonia. See below for details.  Subjective: Patient was seen and examined this morning.   Sleepy, opens eyes on verbal command.  Mumbling.  Not in pain.  Muffled voice.  Assessment/Plan: Severe sepsis secondary to healthcare associated pneumonia/aspiration pneumonia -Met criteria on admission with leukopenia, hypothermia, elevated creatinine.  Lactic acid level normal. -In the SNF, patient was started on Levaquin which was switched to IV cefepime on admission.  Currently remains on the same.  WBC count normal.  I will give him 5-day course of cefepime. Recent Labs  Lab 04/20/21 1549 04/20/21 2315 04/21/21 0453 04/22/21 0454 04/23/21 0542 04/24/21 0511  WBC  --  2.8* 2.8* 6.1 5.3 4.8  LATICACIDVEN 1.0  --   --   --   --   --   PROCALCITON  --  <0.10  --   --   --   --    AKI on CKD 3B -Baseline creatinine less than 1.7.  Presented with a creatinine elevated 2.01.  Currently on IV fluid, creatinine worsened to peak at 2.37, improved to 1.99  today.  Continue to monitor on IV fluids. Recent Labs    03/21/21 0420 03/22/21 0236 03/23/21 0248 03/24/21 0850 03/26/21 0659 04/20/21 1355 04/20/21 2315 04/21/21 0453 04/22/21 0454 04/23/21 0542 04/24/21 0511  BUN 41* 37* 36* 40* 40* 54*  --  48* 45* 45* 46*  CREATININE 1.74* 1.66* 1.66* 1.77* 1.61* 2.01* 1.87* 1.91* 2.25* 2.37* 9.93*   Acute metabolic encephalopathy Dementia with behavioral disturbances -Per family, at baseline, patient is interactive.  Currently patient remains completely altered, restless, unable to have a conversation. Continue to monitor mental status change.  Hopefully it improves with antibiotics and hydration. -As needed IV sedatives to continue.  Mittens on both hands  Chronic sinus bradycardia -Patient reportedly had history of bradycardia.  Unclear etiology. -Currently normal sinus rhythm.  Continue to monitor in telemetry for abnormal pauses.  Goals of care -I discussed the case with patient's daughter on 10/7.  She would like to continue aggressive care at this time.  I think patient may not have a good quality of life going forward.  I will obtain palliative care consultation.  Mobility: Unclear baseline mobility status Code Status:   Code Status: Full Code  Nutritional status: Body mass index is 23.97 kg/m. Nutrition Problem: Moderate Malnutrition Etiology: chronic illness Signs/Symptoms: mild muscle depletion, mild fat depletion, percent weight loss Percent weight loss: 10.2 % Diet:  Diet Order  Diet NPO time specified  Diet effective now                  DVT prophylaxis:  enoxaparin (LOVENOX) injection 40 mg Start: 04/21/21 1000   Antimicrobials: IV cefepime Fluid: Continue NS at 75 mill per hour.  Ongoing tube feeding Consultants: None Family Communication: Called and updated patient's daughter Ms. [name] on 10/7.  Status is: Patient  Remains inpatient appropriate because: Remains altered, continues to need  antibiotics, IV fluids.  Dispo: The patient is from: Nursing facility              Anticipated d/c is to: Back to nursing facility              Patient currently is not medically stable to d/c.   Difficult to place patient No     Infusions:   sodium chloride Stopped (04/20/21 2300)   sodium chloride 75 mL/hr at 04/23/21 1559   ceFEPime (MAXIPIME) IV 2 g (04/24/21 0618)   feeding supplement (JEVITY 1.5 CAL/FIBER) 1,000 mL (04/23/21 1155)    Scheduled Meds:  chlorhexidine  15 mL Mouth Rinse BID   enoxaparin (LOVENOX) injection  40 mg Subcutaneous Q24H   feeding supplement (PROSource TF)  45 mL Per Tube BID   free water  200 mL Per Tube Q4H   mouth rinse  15 mL Mouth Rinse q12n4p   sodium chloride flush  3 mL Intravenous Q12H    Antimicrobials: Anti-infectives (From admission, onward)    Start     Dose/Rate Route Frequency Ordered Stop   04/20/21 1800  ceFEPIme (MAXIPIME) 2 g in sodium chloride 0.9 % 100 mL IVPB        2 g 200 mL/hr over 30 Minutes Intravenous Every 12 hours 04/20/21 1558     04/20/21 1545  cefTRIAXone (ROCEPHIN) 1 g in sodium chloride 0.9 % 100 mL IVPB  Status:  Discontinued        1 g 200 mL/hr over 30 Minutes Intravenous  Once 04/20/21 1532 04/20/21 1548   04/20/21 1545  azithromycin (ZITHROMAX) 500 mg in sodium chloride 0.9 % 250 mL IVPB  Status:  Discontinued        500 mg 250 mL/hr over 60 Minutes Intravenous  Once 04/20/21 1532 04/20/21 1548       PRN meds: acetaminophen **OR** acetaminophen, LORazepam, ondansetron **OR** ondansetron (ZOFRAN) IV   Objective: Vitals:   04/24/21 0500 04/24/21 1206  BP: 137/71 119/73  Pulse:  (!) 53  Resp: 16   Temp: 98.3 F (36.8 C) 98.6 F (37 C)  SpO2: 99% 100%    Intake/Output Summary (Last 24 hours) at 04/24/2021 1400 Last data filed at 04/24/2021 0957 Gross per 24 hour  Intake 347.65 ml  Output 650 ml  Net -302.35 ml   Filed Weights   04/22/21 0716 04/23/21 0500 04/24/21 0500  Weight: 84.7 kg  83.8 kg 87 kg   Weight change: 2.3 kg Body mass index is 23.97 kg/m.   Physical Exam: General exam: Elderly African-American male.  Not in pain or distress. Skin: No rashes, lesions or ulcers. HEENT: Atraumatic, normocephalic, no obvious bleeding Lungs: Clear to auscultation bilaterally CVS: Regular rate and rhythm, no murmur, GI/Abd soft, nontender, nondistended, bowel sound present.  PEG tube site intact CNS: Sleepy, opens eyes on verbal, mumbling, unable to have a conversation or follow command.   Psychiatry: Altered Extremities: No pedal edema, no calf tenderness  Data Review: I have personally reviewed the laboratory data and studies  available.  Recent Labs  Lab 04/20/21 1355 04/20/21 2315 04/21/21 0453 04/22/21 0454 04/23/21 0542 04/24/21 0511  WBC 3.3* 2.8* 2.8* 6.1 5.3 4.8  NEUTROABS 1.4*  --   --  4.3 3.0 2.6  HGB 10.1* 10.8* 10.1* 10.1* 10.1* 9.9*  HCT 32.2* 33.8* 30.7* 30.1* 31.3* 30.5*  MCV 92.5 91.6 89.5 89.6 91.0 90.8  PLT 89* 88* 92* 94* 95* 93*   Recent Labs  Lab 04/20/21 1355 04/20/21 2315 04/21/21 0453 04/22/21 0454 04/23/21 0542 04/24/21 0511  NA 145  --  140 139 140 145  K 5.6*  --  6.1* 4.9 4.9 4.7  CL 107  --  109 106 106 112*  CO2 28  --  $R'25 24 26 23  'DJ$ GLUCOSE 119*  --  71 103* 97 79  BUN 54*  --  48* 45* 45* 46*  CREATININE 2.01* 1.87* 1.91* 2.25* 2.37* 1.99*  CALCIUM 9.7  --  9.7 9.2 9.1 9.4    F/u labs ordered Unresulted Labs (From admission, onward)     Start     Ordered   04/27/21 0500  Creatinine, serum  (enoxaparin (LOVENOX)    CrCl < 30 ml/min)  Weekly,   R     Comments: while on enoxaparin therapy.    04/20/21 2235            Signed, Terrilee Croak, MD [facility] 04/24/2021

## 2021-04-25 DIAGNOSIS — R1310 Dysphagia, unspecified: Secondary | ICD-10-CM

## 2021-04-25 DIAGNOSIS — G9341 Metabolic encephalopathy: Secondary | ICD-10-CM | POA: Diagnosis not present

## 2021-04-25 DIAGNOSIS — R6520 Severe sepsis without septic shock: Secondary | ICD-10-CM | POA: Diagnosis not present

## 2021-04-25 DIAGNOSIS — F03A18 Unspecified dementia, mild, with other behavioral disturbance: Secondary | ICD-10-CM | POA: Diagnosis not present

## 2021-04-25 DIAGNOSIS — J690 Pneumonitis due to inhalation of food and vomit: Secondary | ICD-10-CM | POA: Diagnosis not present

## 2021-04-25 DIAGNOSIS — Z7189 Other specified counseling: Secondary | ICD-10-CM

## 2021-04-25 DIAGNOSIS — A419 Sepsis, unspecified organism: Secondary | ICD-10-CM | POA: Diagnosis not present

## 2021-04-25 LAB — CULTURE, BLOOD (ROUTINE X 2)
Culture: NO GROWTH
Culture: NO GROWTH
Special Requests: ADEQUATE

## 2021-04-25 LAB — CBC WITH DIFFERENTIAL/PLATELET
Abs Immature Granulocytes: 0.01 10*3/uL (ref 0.00–0.07)
Basophils Absolute: 0 10*3/uL (ref 0.0–0.1)
Basophils Relative: 0 %
Eosinophils Absolute: 0.4 10*3/uL (ref 0.0–0.5)
Eosinophils Relative: 7 %
HCT: 30.5 % — ABNORMAL LOW (ref 39.0–52.0)
Hemoglobin: 9.9 g/dL — ABNORMAL LOW (ref 13.0–17.0)
Immature Granulocytes: 0 %
Lymphocytes Relative: 25 %
Lymphs Abs: 1.3 10*3/uL (ref 0.7–4.0)
MCH: 30 pg (ref 26.0–34.0)
MCHC: 32.5 g/dL (ref 30.0–36.0)
MCV: 92.4 fL (ref 80.0–100.0)
Monocytes Absolute: 0.5 10*3/uL (ref 0.1–1.0)
Monocytes Relative: 9 %
Neutro Abs: 3 10*3/uL (ref 1.7–7.7)
Neutrophils Relative %: 59 %
Platelets: 96 10*3/uL — ABNORMAL LOW (ref 150–400)
RBC: 3.3 MIL/uL — ABNORMAL LOW (ref 4.22–5.81)
RDW: 13.2 % (ref 11.5–15.5)
WBC: 5.1 10*3/uL (ref 4.0–10.5)
nRBC: 0 % (ref 0.0–0.2)

## 2021-04-25 LAB — MAGNESIUM: Magnesium: 2 mg/dL (ref 1.7–2.4)

## 2021-04-25 LAB — BASIC METABOLIC PANEL
Anion gap: 7 (ref 5–15)
BUN: 43 mg/dL — ABNORMAL HIGH (ref 8–23)
CO2: 25 mmol/L (ref 22–32)
Calcium: 9.3 mg/dL (ref 8.9–10.3)
Chloride: 110 mmol/L (ref 98–111)
Creatinine, Ser: 1.63 mg/dL — ABNORMAL HIGH (ref 0.61–1.24)
GFR, Estimated: 44 mL/min — ABNORMAL LOW (ref >60)
Glucose, Bld: 93 mg/dL (ref 70–99)
Potassium: 4.9 mmol/L (ref 3.5–5.1)
Sodium: 142 mmol/L (ref 135–145)

## 2021-04-25 LAB — GLUCOSE, CAPILLARY
Glucose-Capillary: 120 mg/dL — ABNORMAL HIGH (ref 70–99)
Glucose-Capillary: 75 mg/dL (ref 70–99)
Glucose-Capillary: 83 mg/dL (ref 70–99)
Glucose-Capillary: 88 mg/dL (ref 70–99)
Glucose-Capillary: 90 mg/dL (ref 70–99)
Glucose-Capillary: 96 mg/dL (ref 70–99)

## 2021-04-25 LAB — PHOSPHORUS: Phosphorus: 3.5 mg/dL (ref 2.5–4.6)

## 2021-04-25 MED ORDER — AMOXICILLIN-POT CLAVULANATE 400-57 MG/5ML PO SUSR
875.0000 mg | Freq: Two times a day (BID) | ORAL | Status: DC
  Administered 2021-04-25 – 2021-04-26 (×3): 875 mg
  Filled 2021-04-25 (×5): qty 10.9

## 2021-04-25 NOTE — Progress Notes (Signed)
Physical Therapy Treatment Patient Details Name: Jack Rivas MRN: [id number] DOB: [birth year] Today's Date: 04/25/2021   History of Present Illness Pt is a 75 y.o. male with AMS, admitted for severe sepsis secondary to healthcare associated pneumonia/aspiration pneumonia.  PMH significant for HTN, dementia, dysphagia for which he receives PEG tube feeding.    PT Comments    Pt does not follow commands nor respond to questions. +2 total assist for attempted supine to sit. Pt became agitated and physically resisted the attempt, so returned to supine. Performed AAROM BLEs. Pt is not able to meaningfully participate in PT at present due to impaired cognition and agitation.     Recommendations for follow up therapy are one component of a multi-disciplinary discharge planning process, led by the attending physician.  Recommendations may be updated based on patient status, additional functional criteria and insurance authorization.  Follow Up Recommendations  SNF     Equipment Recommendations  None recommended by PT    Recommendations for Other Services       Precautions / Restrictions Precautions Precautions: Fall Restrictions Weight Bearing Restrictions: No     Mobility  Bed Mobility Overal bed mobility: Needs Assistance Bed Mobility: Supine to Sit;Sit to Supine     Supine to sit: +2 for physical assistance;+2 for safety/equipment;HOB elevated;Total assist Sit to supine: Total assist;+2 for physical assistance;+2 for safety/equipment;HOB elevated   General bed mobility comments: no active participation from pt, he did not respond to commands, pt became agitated and physicall resisted with attempted supine to sit so returned to supine.    Transfers                    Ambulation/Gait                 Stairs             Wheelchair Mobility    Modified Rankin (Stroke Patients Only)       Balance Overall balance assessment: Needs  assistance Sitting-balance support: Feet supported Sitting balance-Leahy Scale: Zero Sitting balance - Comments: unable to come to full upright position 2* trunk extension/resistance                                    Cognition Arousal/Alertness: Awake/alert Behavior During Therapy: Flat affect;Agitated Overall Cognitive Status: No family/caregiver present to determine baseline cognitive functioning                                 General Comments: non responsive to commands, nor to questions. Eyes open. Became agitated with attempted supine to sit. Pt said, "you all are trying to throw me down". Otherwise, pt did not verbalize at all during the session.      Exercises General Exercises - Lower Extremity Ankle Circles/Pumps: AAROM;Both;10 reps;Supine Heel Slides: AAROM;Both;10 reps Hip ABduction/ADduction: AAROM;Both;10 reps;Supine    General Comments        Pertinent Vitals/Pain Faces Pain Scale: No hurt Pain Location: unable to assess due to pt's expressive difficulties and inability to follow commands. Pt seeming in no acute distress/pain during session.    Home Living                      Prior Function            PT Goals (current goals can now be  found in the care plan section) Acute Rehab PT Goals Patient Stated Goal: pt unable to verbalize- no family present/available PT Goal Formulation: Patient unable to participate in goal setting Time For Goal Achievement: 05/06/21 Potential to Achieve Goals: Fair Progress towards PT goals: Not progressing toward goals - comment (agitation limiting progress, cognition limiting pt participation)    Frequency    Min 2X/week      PT Plan Current plan remains appropriate    Co-evaluation              AM-PAC PT "6 Clicks" Mobility   Outcome Measure  Help needed turning from your back to your side while in a flat bed without using bedrails?: Total Help needed moving from  lying on your back to sitting on the side of a flat bed without using bedrails?: Total Help needed moving to and from a bed to a chair (including a wheelchair)?: Total Help needed standing up from a chair using your arms (e.g., wheelchair or bedside chair)?: Total Help needed to walk in hospital room?: Total Help needed climbing 3-5 steps with a railing? : Total 6 Click Score: 6    End of Session   Activity Tolerance: Treatment limited secondary to agitation Patient left: in bed;with call bell/phone within reach;with bed alarm set Nurse Communication: Mobility status;Need for lift equipment PT Visit Diagnosis: Other abnormalities of gait and mobility (R26.89)     Time: 9528-4132 PT Time Calculation (min) (ACUTE ONLY): 10 min  Charges:  $Therapeutic Activity: 8-22 mins                    Ralene Bathe Kistler PT 04/25/2021  Acute Rehabilitation Services Pager [phone] Office [phone]

## 2021-04-25 NOTE — Progress Notes (Signed)
PROGRESS NOTE  Jack Rivas  DOB: 07/21/1945  PCP: Patient, No Pcp Per (Inactive) QQI:[id number]  DOA: 04/20/2021  LOS: 5 days  Hospital Day: 6  Chief Complaint: Decreased level of consciousness  Brief narrative: Jack Rivas is a 75 y.o. male with PMH significant for HTN, dementia, dysphagia for which he gets PEG tube feeding.  He is a nursing home resident and at baseline interactive, takes p.o. nectar thick liquid and participating rehab with maximal assistance.  Per report, few days ago he was diagnosed with pneumonia and was started on Levaquin through PEG tube.  His respiratory status was stable but confusion persisted and hence he was sent to the ED per family's request.  In the ED, patient was bradycardic with heart rate in 40s.  His temperature was noted to be as low as 91.4. Labs with creatinine elevated to 2, WBC at 3.3 Chest x-ray with evidence of right lower lobe pneumonia Admitted to hospital service for sepsis secondary to pneumonia. See below for details.  Subjective: Patient was seen and examined this morning.   Is more awake today.  Speech more clear.  However not oriented Labs this morning with improving creatinine.  Assessment/Plan: Severe sepsis secondary to healthcare associated pneumonia/aspiration pneumonia -Met criteria on admission with leukopenia, hypothermia, elevated creatinine.  Lactic acid level normal. -In the SNF, patient was started on Levaquin which was switched to IV cefepime on admission.  We will switch to oral Augmentin for next 3 days. Recent Labs  Lab 04/20/21 1549 04/20/21 2315 04/21/21 0453 04/22/21 0454 04/23/21 0542 04/24/21 0511 04/25/21 0831  WBC  --  2.8* 2.8* 6.1 5.3 4.8 5.1  LATICACIDVEN 1.0  --   --   --   --   --   --   PROCALCITON  --  <0.10  --   --   --   --   --    AKI on CKD 3B -Baseline creatinine less than 1.7.  Presented with a creatinine elevated 2.01.  Currently on IV fluid, creatinine worsened to peak at 2.37,,  gradually improving, down to 1.63 today.  Continue to monitor on IV fluids. Recent Labs    03/22/21 0236 03/23/21 0248 03/24/21 0850 03/26/21 0659 04/20/21 1355 04/20/21 2315 04/21/21 0453 04/22/21 0454 04/23/21 0542 04/24/21 0511 04/25/21 0831  BUN 37* 36* 40* 40* 54*  --  48* 45* 45* 46* 43*  CREATININE 1.66* 1.66* 1.77* 1.61* 2.01* 1.87* 1.91* 2.25* 2.37* 1.99* 9.41*   Acute metabolic encephalopathy Dementia with behavioral disturbances -Per family, at baseline, patient is interactive.  Last several days, patient was completely altered, unable to have a conversation.  Mental status seems to be gradually improving.  Has clear voice today.  He still remains disoriented though.  Has mittens on hands.  Has intermittent episodes of restlessness.  Chronic sinus bradycardia -Patient reportedly had history of bradycardia.  Unclear etiology. -Currently normal sinus rhythm.  Continue to monitor in telemetry for abnormal pauses.  Goals of care -I discussed the case with patient's daughter on 10/7.  She would like to continue aggressive care at this time.  I think patient may not have a good quality of life going forward.  Pending palliative care consultation at this time.  Mobility: Unclear baseline mobility status Code Status:   Code Status: Full Code  Nutritional status: Body mass index is 23.97 kg/m. Nutrition Problem: Moderate Malnutrition Etiology: chronic illness Signs/Symptoms: mild muscle depletion, mild fat depletion, percent weight loss Percent weight loss: 10.2 % Diet:  Diet Order             Diet NPO time specified  Diet effective now                  DVT prophylaxis:  enoxaparin (LOVENOX) injection 40 mg Start: 04/21/21 1000   Antimicrobials: IV cefepime Fluid: Continue NS at 75 mill per hour.  Ongoing tube feeding Consultants: None Family Communication: Called and updated patient's daughter Ms. [name] on 10/7.  Status is: Patient  Remains inpatient  appropriate because: Remains altered, continues to need antibiotics, IV fluids.  Dispo: The patient is from: Nursing facility              Anticipated d/c is to: Back to nursing facility in next 1 to 2 days if continues to improve              Patient currently is not medically stable to d/c.   Difficult to place patient No     Infusions:   sodium chloride Stopped (04/20/21 2300)   sodium chloride 75 mL/hr at 04/24/21 2024   ceFEPime (MAXIPIME) IV 2 g (04/25/21 0541)   feeding supplement (JEVITY 1.5 CAL/FIBER) 1,000 mL (04/23/21 1155)    Scheduled Meds:  chlorhexidine  15 mL Mouth Rinse BID   enoxaparin (LOVENOX) injection  40 mg Subcutaneous Q24H   feeding supplement (PROSource TF)  45 mL Per Tube BID   free water  200 mL Per Tube Q4H   mouth rinse  15 mL Mouth Rinse q12n4p   sodium chloride flush  3 mL Intravenous Q12H    Antimicrobials: Anti-infectives (From admission, onward)    Start     Dose/Rate Route Frequency Ordered Stop   04/20/21 1800  ceFEPIme (MAXIPIME) 2 g in sodium chloride 0.9 % 100 mL IVPB        2 g 200 mL/hr over 30 Minutes Intravenous Every 12 hours 04/20/21 1558     04/20/21 1545  cefTRIAXone (ROCEPHIN) 1 g in sodium chloride 0.9 % 100 mL IVPB  Status:  Discontinued        1 g 200 mL/hr over 30 Minutes Intravenous  Once 04/20/21 1532 04/20/21 1548   04/20/21 1545  azithromycin (ZITHROMAX) 500 mg in sodium chloride 0.9 % 250 mL IVPB  Status:  Discontinued        500 mg 250 mL/hr over 60 Minutes Intravenous  Once 04/20/21 1532 04/20/21 1548       PRN meds: acetaminophen **OR** acetaminophen, LORazepam, ondansetron **OR** ondansetron (ZOFRAN) IV   Objective: Vitals:   04/25/21 0500 04/25/21 0740  BP: 129/75 130/78  Pulse:  (!) 51  Resp:    Temp:    SpO2:  100%    Intake/Output Summary (Last 24 hours) at 04/25/2021 1136 Last data filed at 04/25/2021 1000 Gross per 24 hour  Intake 0 ml  Output 2700 ml  Net -2700 ml   Filed Weights    04/22/21 0716 04/23/21 0500 04/24/21 0500  Weight: 84.7 kg 83.8 kg 87 kg   Weight change:  Body mass index is 23.97 kg/m.   Physical Exam: General exam: Elderly African-American male.  Not in pain or distress. Skin: No rashes, lesions or ulcers. HEENT: Atraumatic, normocephalic, no obvious bleeding Lungs: Clear to auscultation bilaterally CVS: Regular rate and rhythm, no murmur, GI/Abd soft, nontender, nondistended, bowel sound present.  PEG tube site intact CNS: Alert, awake, clear voice today.  Remains disoriented though  psychiatry: Mood appropriate Extremities: No pedal edema, no calf tenderness  Data Review: I have personally reviewed the laboratory data and studies available.  Recent Labs  Lab 04/20/21 1355 04/20/21 2315 04/21/21 0453 04/22/21 0454 04/23/21 0542 04/24/21 0511 04/25/21 0831  WBC 3.3*   < > 2.8* 6.1 5.3 4.8 5.1  NEUTROABS 1.4*  --   --  4.3 3.0 2.6 3.0  HGB 10.1*   < > 10.1* 10.1* 10.1* 9.9* 9.9*  HCT 32.2*   < > 30.7* 30.1* 31.3* 30.5* 30.5*  MCV 92.5   < > 89.5 89.6 91.0 90.8 92.4  PLT 89*   < > 92* 94* 95* 93* 96*   < > = values in this interval not displayed.   Recent Labs  Lab 04/21/21 0453 04/22/21 0454 04/23/21 0542 04/24/21 0511 04/25/21 0831  NA 140 139 140 145 142  K 6.1* 4.9 4.9 4.7 4.9  CL 109 106 106 112* 110  CO2 _0 GLUCOSE 71 103* 97 79 93  BUN 48* 45* 45* 46* 43*  CREATININE 1.91* 2.25* 2.37* 1.99* 1.63*  CALCIUM 9.7 9.2 9.1 9.4 9.3  MG  --   --   --   --  2.0  PHOS  --   --   --   --  3.5    F/u labs ordered Unresulted Labs (From admission, onward)     Start     Ordered   04/26/21 0500  CBC with Differential/Platelet  Daily,   R      04/25/21 0809   04/26/21 0931  Basic metabolic panel  Daily,   R      04/25/21 0809            Signed, Terrilee Croak, MD [facility] 04/25/2021

## 2021-04-25 NOTE — Care Management Important Message (Signed)
Important Message  Patient Details IM Letter given to the Patient. Name: Nahmir Zeidman MRN: [id number] Date of Birth: 06/25/1946   Medicare Important Message Given:  Yes     Caren Macadam 04/25/2021, 1:05 PM

## 2021-04-25 NOTE — Consult Note (Addendum)
Consultation Note Date: 04/25/2021   Patient Name: Jack Rivas  DOB: [birth year]  MRN: [id number]  Age / Sex: 75 y.o., male  PCP: Patient, No Pcp Per (Inactive) Referring Physician: Lorin Glass, MD  Reason for Consultation:   HPI/Patient Profile: 75 y.o. male  with past medical history of hypertension, bilateral hearing loss, dementia, prior trach and current PEG, dysphagia, CKD admitted on 04/20/2021 with increasing confusion, hypothermia. Admitted and treated for sepsis likely due to pneumonia. Palliative consulted for "goals of care".    Primary Decision Maker NEXT OF KIN - daughterGala Rivas  Discussion: Chart reviewed.  Patient seen by Dr. Neale Burly on September 7 of this year- per his note:  "I spent a great deal of time reviewing prior notes from [facility] that are available in care everywhere.  During that admission, he was on the ACE unit (acute care for the elderly) and providers had multiple conversations with family regarding the same concerns we are currently dealing with with poor nutrition and aspiration.    it was noted there that family at the bedside continued to feed him despite known aspiration risk.  Also noted that during that encounter, he was DNR.  H&P reveals that this paperwork was torn up by his daughter on arrival to the hospital and he is remains full code at this time. -Currently full code full scope -His daughter feels that he needs another opportunity to see how he does at skilled facility prior to further conversations regarding long-term goals of care.  I did voice concern that this likely to be recurrent issue with repeat admissions. -I would recommend outpatient palliative care to follow him at skilled facility"  Patient was discharged on 9/12 to [facility].   I called and spoke to his daughter Jack Rivas again today. She shares that Jack Rivas has "been down" since his fall  in January in [place]. She notes that he just can't get up and do the things that he used to and that this has been very hard on him. She describes Haynes as a "cool guy", but he just isn't himself anymore.   We discussed his recurrent pnuemonia that is likely due to aspiration despite having a PEG tube. Jack Rivas notes that Raekwon was vent dependent for 3 months earlier this year and eventually liberated and his trach was removed- she laments that his trach was removed and blames this for the pneumonia- I reviewed with her that having the trach removed is not a contributor to his aspiration.   I gently discussed with Jack Rivas the worries that he will continue to aspirate and will have ongoing decline with recurrent hospitalizations for pneumonia. We discussed that in these cases typically either the patient dies eventually on their own, or the family and patient decide that the quality of life they are currently living is not tolerable and opt for comfort focused care without further treatments for pneumonia. Jack Rivas shares that she has been concerned about this and she needs to start making arrangements. She is hoping that her Dad  will continue to improve for now and give her more time to prepare for his end of life.     SUMMARY OF RECOMMENDATIONS -Full code, full scope -Jack Rivas agrees to in person meeting on Thursday at 12N- if patient is discharged before then- recommend outpatient Palliative referral    Code Status/Advance Care Planning: Full code   Prognosis:   Unable to determine  Discharge Planning: To Be Determined  Primary Diagnoses: Present on Admission:  HCAP (healthcare-associated pneumonia)  Acute metabolic encephalopathy  Dementia (HCC)  CKD (chronic kidney disease), stage III (HCC)  Aspiration pneumonia (HCC)  Bradycardia  Severe sepsis (HCC)   Review of Systems  Unable to perform ROS: Dementia   Physical Exam Vitals and nursing note reviewed.  Neurological:      Mental Status: He is alert.     Comments: Does not answer questions, does not track    Vital Signs: BP 116/65 (BP Location: Left Arm)   Pulse (!) 45   Temp 98.2 F (36.8 C) (Oral)   Resp 10   Ht 6\' 3"  (1.905 m)   Wt 87 kg   SpO2 100%   BMI 23.97 kg/m  Pain Scale: 0-10   Pain Score: Asleep   SpO2: SpO2: 100 % O2 Device:SpO2: 100 % O2 Flow Rate: .   IO: Intake/output summary:  Intake/Output Summary (Last 24 hours) at 04/25/2021 1525 Last data filed at 04/25/2021 1000 Gross per 24 hour  Intake 0 ml  Output 2700 ml  Net -2700 ml    LBM: Last BM Date: 04/24/21 Baseline Weight: Weight: 83.9 kg Most recent weight: Weight: 87 kg     Palliative Assessment/Data:       Thank you for this consult. Palliative medicine will continue to follow and assist as needed.   Time In: 1452 Time Out: 1610 Time Total: 78 minutes Greater than 50%  of this time was spent counseling and coordinating care related to the above assessment and plan.  Signed by: 1611, AGNP-C Palliative Medicine    Please contact Palliative Medicine Team phone at [phone] for questions and concerns.  For individual provider: See [phone]

## 2021-04-26 DIAGNOSIS — A419 Sepsis, unspecified organism: Secondary | ICD-10-CM | POA: Diagnosis not present

## 2021-04-26 DIAGNOSIS — R6520 Severe sepsis without septic shock: Secondary | ICD-10-CM | POA: Diagnosis not present

## 2021-04-26 LAB — RESP PANEL BY RT-PCR (FLU A&B, COVID) ARPGX2
Influenza A by PCR: NEGATIVE
Influenza B by PCR: NEGATIVE
SARS Coronavirus 2 by RT PCR: NEGATIVE

## 2021-04-26 LAB — BASIC METABOLIC PANEL
Anion gap: 6 (ref 5–15)
BUN: 39 mg/dL — ABNORMAL HIGH (ref 8–23)
CO2: 24 mmol/L (ref 22–32)
Calcium: 8.7 mg/dL — ABNORMAL LOW (ref 8.9–10.3)
Chloride: 108 mmol/L (ref 98–111)
Creatinine, Ser: 1.37 mg/dL — ABNORMAL HIGH (ref 0.61–1.24)
GFR, Estimated: 54 mL/min — ABNORMAL LOW (ref >60)
Glucose, Bld: 99 mg/dL (ref 70–99)
Potassium: 4.6 mmol/L (ref 3.5–5.1)
Sodium: 138 mmol/L (ref 135–145)

## 2021-04-26 LAB — CBC WITH DIFFERENTIAL/PLATELET
Abs Immature Granulocytes: 0.01 10*3/uL (ref 0.00–0.07)
Basophils Absolute: 0 10*3/uL (ref 0.0–0.1)
Basophils Relative: 0 %
Eosinophils Absolute: 0.3 10*3/uL (ref 0.0–0.5)
Eosinophils Relative: 9 %
HCT: 27.3 % — ABNORMAL LOW (ref 39.0–52.0)
Hemoglobin: 8.9 g/dL — ABNORMAL LOW (ref 13.0–17.0)
Immature Granulocytes: 0 %
Lymphocytes Relative: 33 %
Lymphs Abs: 1.2 10*3/uL (ref 0.7–4.0)
MCH: 29.5 pg (ref 26.0–34.0)
MCHC: 32.6 g/dL (ref 30.0–36.0)
MCV: 90.4 fL (ref 80.0–100.0)
Monocytes Absolute: 0.4 10*3/uL (ref 0.1–1.0)
Monocytes Relative: 10 %
Neutro Abs: 1.8 10*3/uL (ref 1.7–7.7)
Neutrophils Relative %: 48 %
Platelets: 95 10*3/uL — ABNORMAL LOW (ref 150–400)
RBC: 3.02 MIL/uL — ABNORMAL LOW (ref 4.22–5.81)
RDW: 13.2 % (ref 11.5–15.5)
WBC: 3.7 10*3/uL — ABNORMAL LOW (ref 4.0–10.5)
nRBC: 0 % (ref 0.0–0.2)

## 2021-04-26 LAB — GLUCOSE, CAPILLARY
Glucose-Capillary: 52 mg/dL — ABNORMAL LOW (ref 70–99)
Glucose-Capillary: 74 mg/dL (ref 70–99)
Glucose-Capillary: 76 mg/dL (ref 70–99)
Glucose-Capillary: 84 mg/dL (ref 70–99)
Glucose-Capillary: 93 mg/dL (ref 70–99)
Glucose-Capillary: 95 mg/dL (ref 70–99)
Glucose-Capillary: 98 mg/dL (ref 70–99)

## 2021-04-26 MED ORDER — PROSOURCE TF PO LIQD: 45.0000 mL | Freq: Two times a day (BID) | ORAL | Status: DC

## 2021-04-26 MED ORDER — GLUCAGON HCL RDNA (DIAGNOSTIC) 1 MG IJ SOLR
INTRAMUSCULAR | Status: AC
  Administered 2021-04-26: 1 mg via INTRAMUSCULAR
  Filled 2021-04-26: qty 1

## 2021-04-26 MED ORDER — GLUCAGON HCL RDNA (DIAGNOSTIC) 1 MG IJ SOLR: 1.0000 mg | INTRAMUSCULAR | Status: AC

## 2021-04-26 MED ORDER — AMOXICILLIN-POT CLAVULANATE 400-57 MG/5ML PO SUSR: 875.0000 mg | Freq: Two times a day (BID) | ORAL | 0 refills | Status: DC

## 2021-04-26 MED ORDER — JEVITY 1.5 CAL/FIBER PO LIQD: 1000.0000 mL | ORAL | Status: DC

## 2021-04-26 NOTE — NC FL2 (Signed)
[place] MEDICAID FL2 LEVEL OF CARE SCREENING TOOL     IDENTIFICATION  Patient Name: Jack Rivas Birthdate: [birth year] Sex: male Admission Date (Current Location): 04/20/2021  [facility] and [place] Number:  Producer, television/film/video and Address:         Provider Number: [id number]  Attending Physician Name and Address:  Lorin Glass, MD  Relative Name and Phone Number:  [name] dtr [phone]    Current Level of Care: Hospital Recommended Level of Care: Skilled Nursing Facility Prior Approval Number:    Date Approved/Denied:   PASRR Number: [id number]  Discharge Plan: SNF    Current Diagnoses: Patient Active Problem List   Diagnosis Date Noted   HCAP (healthcare-associated pneumonia) 04/20/2021   CKD (chronic kidney disease), stage III (HCC) 04/20/2021   Bradycardia 04/20/2021   Severe sepsis (HCC) 04/20/2021   Malnutrition of moderate degree 03/21/2021   Acute metabolic encephalopathy 03/20/2021   Aspiration pneumonia (HCC) 03/20/2021   Dementia (HCC) 03/20/2021   Dysphagia 03/20/2021    Orientation RESPIRATION BLADDER Height & Weight     Self, Time, Situation  Normal Incontinent Weight: 86.5 kg Height:  6\' 3"  (190.5 cm)  BEHAVIORAL SYMPTOMS/MOOD NEUROLOGICAL BOWEL NUTRITION STATUS      Incontinent Diet, Feeding tube (dysphagia;PEG-Jevity)  AMBULATORY STATUS COMMUNICATION OF NEEDS Skin   Limited Assist Verbally Normal                       Personal Care Assistance Level of Assistance  Bathing, Dressing           Functional Limitations Info  Sight, Hearing, Speech Sight Info: Impaired (eyeglasses) Hearing Info: Impaired (HOH) Speech Info: Adequate    SPECIAL CARE FACTORS FREQUENCY  PT (By licensed PT), OT (By licensed OT)     PT Frequency:  (5x week) OT Frequency:  (5x week)            Contractures Contractures Info: Not present    Additional Factors Info  Code Status, Allergies, Psychotropic Code Status Info:  (Full) Allergies  Info:  (NKA) Psychotropic Info:  (Ativan)         Current Medications (04/26/2021):  This is the current hospital active medication list Current Facility-Administered Medications  Medication Dose Route Frequency Provider Last Rate Last Admin   0.9 %  sodium chloride infusion   Intravenous Continuous 06/26/2021, MD   Stopped at 04/20/21 2300   acetaminophen (TYLENOL) tablet 650 mg  650 mg Oral Q6H PRN 06/20/21, MD       Or   acetaminophen (TYLENOL) suppository 650 mg  650 mg Rectal Q6H PRN Hollice Espy, MD       amoxicillin-clavulanate (AUGMENTIN) 400-57 MG/5ML suspension 875 mg  875 mg Per Tube Q12H Dahal, Hollice Espy, MD   875 mg at 04/26/21 1049   chlorhexidine (PERIDEX) 0.12 % solution 15 mL  15 mL Mouth Rinse BID 06/26/21, MD   15 mL at 04/26/21 0945   enoxaparin (LOVENOX) injection 40 mg  40 mg Subcutaneous Q24H 06/26/21, MD   40 mg at 04/26/21 1048   feeding supplement (JEVITY 1.5 CAL/FIBER) liquid 1,000 mL  1,000 mL Per Tube Continuous Dahal, 06/26/21, MD 65 mL/hr at 04/26/21 0049 1,000 mL at 04/26/21 0049   feeding supplement (PROSource TF) liquid 45 mL  45 mL Per Tube BID Dahal, 06/26/21, MD   45 mL at 04/26/21 1049   free water 200 mL  200 mL Per Tube Q4H  Lorin Glass, MD   200 mL at 04/26/21 0800   LORazepam (ATIVAN) injection 0.5 mg  0.5 mg Intravenous Q6H PRN Hollice Espy, MD   0.5 mg at 04/25/21 1659   MEDLINE mouth rinse  15 mL Mouth Rinse q12n4p Hollice Espy, MD   15 mL at 04/26/21 1050   ondansetron (ZOFRAN) tablet 4 mg  4 mg Oral Q6H PRN Hollice Espy, MD       Or   ondansetron [facility]) injection 4 mg  4 mg Intravenous Q6H PRN Hollice Espy, MD       sodium chloride flush (NS) 0.9 % injection 3 mL  3 mL Intravenous Q12H Hollice Espy, MD   3 mL at 04/26/21 1050     Discharge Medications: Please see discharge summary for a list of discharge medications.  Relevant Imaging Results:  Relevant Lab  Results:   Additional Information [id number] [place], [place]

## 2021-04-26 NOTE — Progress Notes (Signed)
Ambulance arrived to transport patient, CBG 52 IM glucagon given, CBG 74 transport agreed to take patient to [facility]. Update given to facility.

## 2021-04-26 NOTE — TOC Transition Note (Addendum)
Transition of Care [facility]) - CM/SW Discharge Note   Patient Details  Name: Jack Rivas MRN: [id number] Date of Birth: 01-30-1946  Transition of Care [facility]) CM/SW Contact:  Lanier Clam, RN Phone Number: 04/26/2021, 1:27 PM   Clinical Narrative: d/c back to [facility] rep [place] accepted back-awaiting covid results prior getting rm#,tel# for nsg to call report-PTAR. Forms @ nsg station.  2:41p-Received covid results;[facility] rep [place] aware going to rm#[id number],nsg call report tel#[phone].PTAR called.    Final next level of care: Skilled Nursing Facility Barriers to Discharge: No Barriers Identified   Patient Goals and CMS Choice Patient states their goals for this hospitalization and ongoing recovery are:: return back to [facility] CMS Medicare.gov Compare Post Acute Care list provided to:: Patient Represenative (must comment) (dtr [name] [phone]) Choice offered to / list presented to : Adult Children  Discharge Placement   Existing PASRR number confirmed : 04/26/21          Patient [facility] bed at: [facility] Patient to be transferred to facility by: PTAR Name of family member notified: [name] dtr [phone] Patient and family notified of of transfer: 04/26/21  Discharge Plan and Services   Discharge Planning Services: CM Consult Post Acute Care Choice: Skilled Nursing Facility                               Social Determinants of Health (SDOH) Interventions     Readmission Risk Interventions Readmission Risk Prevention Plan 03/28/2021  Transportation Screening Complete  PCP or Specialist Appt within 3-5 Days Complete  HRI or Home Care Consult Complete  Social Work Consult for Recovery Care Planning/Counseling Complete  Palliative Care Screening Not Applicable  Medication Review Oceanographer) Complete

## 2021-04-26 NOTE — Discharge Summary (Signed)
Physician Discharge Summary  Hendricks Rivas ZDG:[id number] DOB: Jan 06, 1946 DOA: 04/20/2021  PCP: Patient, No Pcp Per (Inactive)  Admit date: 04/20/2021 Discharge date: 04/26/2021  Admitted From: Nursing facility Discharge disposition: Back to nursing facility   Code Status: Full Code   Discharge Diagnosis:   Principal Problem:   Severe sepsis ([place]) Active Problems:   Acute metabolic encephalopathy   Aspiration pneumonia ([place])   Dementia ([place])   Dysphagia   HCAP (healthcare-associated pneumonia)   CKD (chronic kidney disease), stage III ([place])   Bradycardia   Chief Complaint: Decreased level of consciousness  Brief narrative: Jack Rivas is a 75 y.o. male with PMH significant for HTN, dementia, dysphagia for which he gets PEG tube feeding.  He is a nursing home resident and at baseline interactive, takes p.o. nectar thick liquid and participating rehab with maximal assistance.  Per report, few days ago he was diagnosed with pneumonia and was started on Levaquin through PEG tube.  His respiratory status was stable but confusion persisted and hence he was sent to the ED per family's request.  In the ED, patient was bradycardic with heart rate in 40s.  His temperature was noted to be as low as 91.4. Labs with creatinine elevated to 2, WBC at 3.3 Chest x-ray with evidence of right lower lobe pneumonia Admitted to hospital service for sepsis secondary to pneumonia. See below for details.  Subjective: Patient was seen and examined this morning.    Is alert, awake, muffled voice.  Mittens in place.  Not in distress.  PEG tube feeding ongoing.  Assessment/Plan: Severe sepsis secondary to healthcare associated pneumonia/aspiration pneumonia -Met criteria on admission with leukopenia, hypothermia, elevated creatinine.  Lactic acid level normal. -In the SNF, patient was started on Levaquin which was switched to IV cefepime on admission.  Clinically improved, patient has been switched to  oral Augmentin to continue for next 3 days. Recent Labs  Lab 04/20/21 1549 04/20/21 2315 04/21/21 0453 04/22/21 0454 04/23/21 0542 04/24/21 0511 04/25/21 0831 04/26/21 0413  WBC  --  2.8*   < > 6.1 5.3 4.8 5.1 3.7*  LATICACIDVEN 1.0  --   --   --   --   --   --   --   PROCALCITON  --  <0.10  --   --   --   --   --   --    < > = values in this interval not displayed.   AKI on CKD 3B -Baseline creatinine less than 1.7.  Presented with a creatinine elevated 2.01.  Currently on IV fluid, creatinine worsened to peak at 2.37,, gradually improving, down to one-point today.   Recent Labs    03/23/21 0248 03/24/21 0850 03/26/21 0659 04/20/21 1355 04/20/21 2315 04/21/21 0453 04/22/21 0454 04/23/21 0542 04/24/21 0511 04/25/21 0831 04/26/21 0413  BUN 36* 40* 40* 54*  --  48* 45* 45* 46* 43* 39*  CREATININE 1.66* 1.77* 1.61* 2.01* 1.87* 1.91* 2.25* 2.37* 1.99* 1.63* 5.95*   Acute metabolic encephalopathy Dementia with behavioral disturbances -Per family, at baseline, patient was interactive.  Last several days, patient was completely altered, unable to have a conversation.  Mental status seems to be gradually improving but probably will take next few days to return back to baseline. Not restless or agitated.  Chronic sinus bradycardia -Patient reportedly had history of bradycardia.  Unclear etiology. -Currently normal sinus rhythm.  Continue to monitor in telemetry for abnormal pauses.  Goals of care -Palliative care consultation appreciated.  Currently full code.   Allergies as of 04/26/2021   No Known Allergies      Medication List     STOP taking these medications    levofloxacin 750 MG tablet Commonly known as: LEVAQUIN       TAKE these medications    acetaminophen 325 MG tablet Commonly known as: TYLENOL Place 650 mg into feeding tube every 6 (six) hours as needed for pain.   amoxicillin-clavulanate 400-57 MG/5ML suspension Commonly known as:  AUGMENTIN Place 10.9 mLs (875 mg total) into feeding tube every 12 (twelve) hours.   diclofenac Sodium 1 % Gel Commonly known as: VOLTAREN Apply 2 g topically 2 (two) times daily. Apply to right knee   divalproex 125 MG capsule Commonly known as: DEPAKOTE SPRINKLE Take 125 mg by mouth at bedtime.   famotidine 20 MG tablet Commonly known as: PEPCID Place 20 mg into feeding tube at bedtime.   feeding supplement (JEVITY 1.5 CAL/FIBER) Liqd Place 1,000 mLs into feeding tube continuous. What changed: Another medication with the same name was changed. Make sure you understand how and when to take each.   feeding supplement (PROSource TF) liquid Place 45 mLs into feeding tube 2 (two) times daily. What changed: when to take this   food thickener Gel Commonly known as: SIMPLYTHICK (NECTAR/LEVEL 2/MILDLY THICK) Take 1 packet by mouth as needed.   free water Soln Place 150 mLs into feeding tube every 4 (four) hours.   melatonin 3 MG Tabs tablet Place 6 mg into feeding tube at bedtime.   nystatin 100000 UNIT/ML suspension Commonly known as: MYCOSTATIN Take 5 mLs by mouth 4 (four) times daily. 5 day supply   polyethylene glycol powder 17 GM/SCOOP powder Commonly known as: GLYCOLAX/MIRALAX Place 17 g into feeding tube daily. Mix with 8oz of fluid   QUEtiapine 50 MG tablet Commonly known as: SEROQUEL Place 1 tablet (50 mg total) into feeding tube at bedtime. What changed:  when to take this Another medication with the same name was removed. Continue taking this medication, and follow the directions you see here.        Discharge Instructions:  Diet Recommendation: Tube feeding with Jevity  Follow with Primary MD Patient, No Pcp Per (Inactive) in 7 days   Get CBC/BMP checked in next visit within 1 week by PCP or SNF MD. (We routinely change or add medications that can affect your baseline labs and fluid status, therefore we recommend that you get the mentioned basic workup  next visit with your PCP, your PCP may decide not to get them or add new tests based on their clinical decision)  On your next visit with your PCP, please get your medicines reviewed and adjusted.  Please request your PCP  to go over all hospital tests, procedures, radiology results at the follow up, please get all Hospital records sent to your PCP by signing hospital release before you go home.  Activity: As tolerated with Full fall precautions use walker/cane & assistance as needed  Avoid using any recreational substances like cigarette, tobacco, alcohol, or non-prescribed drug.  If you experience worsening of your admission symptoms, develop shortness of breath, life threatening emergency, suicidal or homicidal thoughts you must seek medical attention immediately by calling 911 or calling your MD immediately  if symptoms less severe.  You must read complete instructions/literature along with all the possible adverse reactions/side effects for all the medicines you take and that have been prescribed to you. Take any new medicine only after  you have completely understood and accepted all the possible adverse reactions/side effects.   Do not drive, operate heavy machinery, perform activities at heights, swimming or participation in water activities or provide baby sitting services if your were admitted for syncope or siezures until you have seen by Primary MD or a Neurologist and advised to do so again.  Do not drive when taking Pain medications.  Do not take more than prescribed Pain, Sleep and Anxiety Medications  Wear Seat belts while driving.  Please note You were cared for by a hospitalist during your hospital stay. If you have any questions about your discharge medications or the care you received while you were in the hospital after you are discharged, you can call the unit and asked to speak with the hospitalist on call if the hospitalist that took care of you is not available. Once you  are discharged, your primary care physician will handle any further medical issues. Please note that NO REFILLS for any discharge medications will be authorized once you are discharged, as it is imperative that you return to your primary care physician (or establish a relationship with a primary care physician if you do not have one) for your aftercare needs so that they can reassess your need for medications and monitor your lab values.   Follow ups:     Wound care:     Discharge Exam:   Vitals:   04/26/21 0500 04/26/21 0504 04/26/21 0800 04/26/21 0910  BP: 136/65  116/60   Pulse: 63  (!) 52   Resp: 16  11   Temp: 97.7 F (36.5 C)  97.7 F (36.5 C) (!) 97.4 F (36.3 C)  TempSrc: Axillary  Oral Oral  SpO2: 100%  100%   Weight:  86.5 kg    Height:        Body mass index is 23.84 kg/m.  General exam: Pleasant elderly African-American male.  Not in physical distress.  Mittens in hand Skin: No rashes, lesions or ulcers. HEENT: Atraumatic, normocephalic, no obvious bleeding Lungs: Clear to auscultation bilaterally CVS: Regular rate and rhythm, no murmur GI/Abd soft, nontender, nondistended, bowel sound present, PEG tube site intact CNS: Alert, awake, muffled voice.  Only oriented to place Psychiatry: Mood appropriate Extremities: No pedal edema, no calf tenderness  Time coordinating discharge: 35 minutes   The results of significant diagnostics from this hospitalization (including imaging, microbiology, ancillary and laboratory) are listed below for reference.    Procedures and Diagnostic Studies:   DG Chest Port 1 View  Result Date: 04/20/2021 CLINICAL DATA:  Pneumonia. EXAM: PORTABLE CHEST 1 VIEW COMPARISON:  03/19/2021 FINDINGS: The cardiac silhouette, mediastinal and hilar contours are is within normal limits and stable. Patchy airspace opacity in the right lower lobe consistent with pneumonia. No pleural effusions. No pneumothorax. IMPRESSION: Right lower lobe  pneumonia. Electronically Signed   By: Marijo Sanes M.D.   On: 04/20/2021 15:12     Labs:   Basic Metabolic Panel: Recent Labs  Lab 04/22/21 0454 04/23/21 0542 04/24/21 0511 04/25/21 0831 04/26/21 0413  NA 139 140 145 142 138  K 4.9 4.9 4.7 4.9 4.6  CL 106 106 112* 110 108  CO2 $Re'24 26 23 25 24  'YFf$ GLUCOSE 103* 97 79 93 99  BUN 45* 45* 46* 43* 39*  CREATININE 2.25* 2.37* 1.99* 1.63* 1.37*  CALCIUM 9.2 9.1 9.4 9.3 8.7*  MG  --   --   --  2.0  --   PHOS  --   --   --  3.5  --    GFR Estimated Creatinine Clearance: 55.7 mL/min (A) (by C-G formula based on SCr of 1.37 mg/dL (H)). Liver Function Tests: Recent Labs  Lab 04/21/21 0453  AST 32  ALT 45*  ALKPHOS 77  BILITOT 0.4  PROT 7.3  ALBUMIN 3.1*   No results for input(s): LIPASE, AMYLASE in the last 168 hours. No results for input(s): AMMONIA in the last 168 hours. Coagulation profile No results for input(s): INR, PROTIME in the last 168 hours.  CBC: Recent Labs  Lab 04/22/21 0454 04/23/21 0542 04/24/21 0511 04/25/21 0831 04/26/21 0413  WBC 6.1 5.3 4.8 5.1 3.7*  NEUTROABS 4.3 3.0 2.6 3.0 1.8  HGB 10.1* 10.1* 9.9* 9.9* 8.9*  HCT 30.1* 31.3* 30.5* 30.5* 27.3*  MCV 89.6 91.0 90.8 92.4 90.4  PLT 94* 95* 93* 96* 95*   Cardiac Enzymes: No results for input(s): CKTOTAL, CKMB, CKMBINDEX, TROPONINI in the last 168 hours. BNP: Invalid input(s): POCBNP CBG: Recent Labs  Lab 04/25/21 1658 04/25/21 2024 04/26/21 0018 04/26/21 0500 04/26/21 0906  GLUCAP 96 83 84 95 93   D-Dimer No results for input(s): DDIMER in the last 72 hours. Hgb A1c No results for input(s): HGBA1C in the last 72 hours. Lipid Profile No results for input(s): CHOL, HDL, LDLCALC, TRIG, CHOLHDL, LDLDIRECT in the last 72 hours. Thyroid function studies No results for input(s): TSH, T4TOTAL, T3FREE, THYROIDAB in the last 72 hours.  Invalid input(s): FREET3 Anemia work up No results for input(s): VITAMINB12, FOLATE, FERRITIN, TIBC, IRON,  RETICCTPCT in the last 72 hours. Microbiology Recent Results (from the past 240 hour(s))  Culture, blood (Routine X 2) w Reflex to ID Panel     Status: None   Collection Time: 04/20/21  1:55 PM   Specimen: Site Not Specified; Blood  Result Value Ref Range Status   Specimen Description   Final    SITE NOT SPECIFIED Performed at [facility] [address] [place], [place] [address]    Special Requests   Final    BOTTLES DRAWN AEROBIC AND ANAEROBIC Blood Culture results may not be optimal due to an inadequate volume of blood received in culture bottles Performed at [facility] [address], [place], [place] [address]    Culture   Final    NO GROWTH 5 DAYS Performed at [facility], [place] [address] [place], [place] [address]    Report Status 04/25/2021 FINAL  Final  Culture, blood (Routine X 2) w Reflex to ID Panel     Status: None   Collection Time: 04/20/21  3:18 PM   Specimen: BLOOD  Result Value Ref Range Status   Specimen Description   Final    BLOOD BLOOD RIGHT FOREARM Performed at [facility] [address], [place], [place] [address]    Special Requests   Final    BOTTLES DRAWN AEROBIC AND ANAEROBIC Blood Culture adequate volume Performed at [facility] [address] [place], [place] [address]    Culture   Final    NO GROWTH 5 DAYS Performed at [facility], [place] [address], [place], [place] [address]    Report Status 04/25/2021 FINAL  Final  Resp Panel by RT-PCR (Flu A&B, Covid) Nasopharyngeal Swab     Status: None   Collection Time: 04/20/21  8:55 PM   Specimen: Nasopharyngeal Swab; Nasopharyngeal(NP) swabs in vial transport medium  Result Value Ref Range Status   SARS Coronavirus 2  by RT PCR NEGATIVE NEGATIVE Final    Comment: (NOTE) SARS-CoV-2 target nucleic acids are NOT DETECTED.  The SARS-CoV-2 RNA is generally detectable in upper respiratory specimens during  the acute phase of infection. The lowest concentration of SARS-CoV-2 viral copies this assay can detect is 138 copies/mL. A negative result does not preclude SARS-Cov-2 infection and should not be used as the sole basis for treatment or other patient management decisions. A negative result may occur with  improper specimen collection/handling, submission of specimen other than nasopharyngeal swab, presence of viral mutation(s) within the areas targeted by this assay, and inadequate number of viral copies(<138 copies/mL). A negative result must be combined with clinical observations, patient history, and epidemiological information. The expected result is Negative.  Fact Sheet for Patients:  EntrepreneurPulse.com.au  Fact Sheet for Healthcare Providers:  IncredibleEmployment.be  This test is no t yet approved or cleared by the [place] FDA and  has been authorized for detection and/or diagnosis of SARS-CoV-2 by FDA under an Emergency Use Authorization (EUA). This EUA will remain  in effect (meaning this test can be used) for the duration of the COVID-19 declaration under Section 564(b)(1) of the Act, 21 U.S.C.section 360bbb-3(b)(1), unless the authorization is terminated  or revoked sooner.       Influenza A by PCR NEGATIVE NEGATIVE Final   Influenza B by PCR NEGATIVE NEGATIVE Final    Comment: (NOTE) The Xpert Xpress SARS-CoV-2/FLU/RSV plus assay is intended as an aid in the diagnosis of influenza from Nasopharyngeal swab specimens and should not be used as a sole basis for treatment. Nasal washings and aspirates are unacceptable for Xpert Xpress SARS-CoV-2/FLU/RSV testing.  Fact Sheet for Patients: EntrepreneurPulse.com.au  Fact Sheet for Healthcare Providers: IncredibleEmployment.be  This test is not yet approved or cleared by the [place] FDA and has been authorized for detection and/or  diagnosis of SARS-CoV-2 by FDA under an Emergency Use Authorization (EUA). This EUA will remain in effect (meaning this test can be used) for the duration of the COVID-19 declaration under Section 564(b)(1) of the Act, 21 U.S.C. section 360bbb-3(b)(1), unless the authorization is terminated or revoked.  Performed at [facility], [place] [address], [place], [place] [address]      Signed: Marlowe Aschoff Macyn Shropshire  [facility] 04/26/2021, 12:01 PM

## 2021-04-26 NOTE — Progress Notes (Signed)
Patient discharging back to [facility].  Report called to [place] at facility.  IV removed - WNL.  AVS placed in packet to go with transport.  Patient in NAD at this time

## 2021-04-29 ENCOUNTER — Non-Acute Institutional Stay: Payer: Medicare Other | Admitting: Hospice

## 2021-04-29 ENCOUNTER — Other Ambulatory Visit: Payer: Self-pay

## 2021-04-29 DIAGNOSIS — J690 Pneumonitis due to inhalation of food and vomit: Secondary | ICD-10-CM

## 2021-04-29 DIAGNOSIS — F0150 Vascular dementia without behavioral disturbance: Secondary | ICD-10-CM

## 2021-04-29 DIAGNOSIS — Z515 Encounter for palliative care: Secondary | ICD-10-CM

## 2021-04-29 DIAGNOSIS — R1310 Dysphagia, unspecified: Secondary | ICD-10-CM

## 2021-04-29 NOTE — Progress Notes (Signed)
[facility] Consult Note Telephone: [phone]  Fax: [phone]  PATIENT NAME: Jack Rivas [address] [place] [address] [phone] (home)  DOB: [birth year] MRN: [id number]  PRIMARY CARE PROVIDER:    Dr. Garwin Brothers  REFERRING PROVIDER:   Dr. Garwin Brothers  RESPONSIBLE PARTY:   [place] Information     Name Relation Home Work Mobile   Tarpley,[name] Daughter   [phone]        I met face to face with patient at facility. Palliative Care was asked to follow this patient by consultation request of Dr. Garwin Brothers to address advance care planning, complex medical decision making and goals of care clarification.  NP called [name] and updated her on visit.  This is the initial visit.    ASSESSMENT AND / RECOMMENDATIONS:   Advance Care Planning: Our advance care planning conversation included a discussion about:    The value and importance of advance care planning  Difference between Hospice and Palliative care Exploration of goals of care in the event of a sudden injury or illness  Identification and preparation of a healthcare agent  Review and updating or creation of an  advance directive document . Decision not to resuscitate or to de-escalate disease focused treatments due to poor prognosis.  CODE STATUS: [name] affirmed that patient is a full code.  Goals of Care: Goals include to maximize quality of life and symptom management  I spent 16 minutes providing this initial consultation. More than 50% of the time in this consultation was spent on counseling patient and coordinating communication. --------------------------------------------------------------------------------------------------------------------------------------  Symptom Management/Plan: Pneumonitis: Continue Augmentin as ordered and to completion.  Aspiration precautions discussed with nursing. Dysphagia: Continue ongoing PEG tube feeding.   ST consult as needed. Dementia: Advanced, incontinent of bowel and bladder, FAST 7A.  Safety/fall precautions.  Continue ongoing supportive care.  Follow up: Palliative care will continue to follow for complex medical decision making, advance care planning, and clarification of goals. Return 6 weeks or prn.Encouraged to call provider sooner with any concerns.   Family /Caregiver/Community Supports: Patient in SNF for ongoing care  HOSPICE ELIGIBILITY/DIAGNOSIS: TBD  Chief Complaint: Initial Palliative care visit  HISTORY OF PRESENT ILLNESS:  Jack Rivas is a 75 y.o. year old male  with multiple medical conditions including acute pneumonitis due to inhalation of food and vomit for which patient was hospitalized 10/5 to 04/26/2021.  He was treated with IV antibiotics and transitioned to Augmentin which he will complete 05/01/2021.  History ofHypertension, dementia, dysphagia.  Patient with limited communication during visit, no signs of distress-FLACC 0.  [name] reports that his dementia is progressing; dementia as a progressive and terminal disease.  History obtained from review of EMR, discussion with primary team, caregiver, family and/or Jack Rivas.  Review and summarization of Epic records shows history from other than patient. Rest of 10 point ROS asked and negative.     Review of lab tests/diagnostics   Recent Labs  Lab 04/24/21 0511 04/25/21 0831 04/26/21 0413  WBC 4.8 5.1 3.7*  HGB 9.9* 9.9* 8.9*  HCT 30.5* 30.5* 27.3*  PLT 93* 96* 95*  MCV 90.8 92.4 90.4   Recent Labs  Lab 04/24/21 0511 04/25/21 0831 04/26/21 0413  NA 145 142 138  K 4.7 4.9 4.6  CL 112* 110 108  CO2 _0 BUN 46* 43* 39*  CREATININE 1.99* 1.63* 1.37*  GLUCOSE 79 93 99     Physical Exam: Constitutional: NAD  General: Well groomed, cooperative, FLACC 0 EYES: anicteric sclera, lids intact, no discharge  ENMT: Moist mucous membrane CV: S1 S2, RRR, no LE edema Pulmonary: no increased work of  breathing, no cough, Abdomen: active BS + 4 quadrants, soft and non tender; PEG tube in place GU: no suprapubic tenderness MSK: weakness, sarcopenia, limited ROM Skin: warm and dry, no rashes or wounds on visible skin Neuro:  weakness, otherwise non focal.  Memory loss Psych: non-anxious affect Hem/lymph/immuno: no widespread bruising   PAST MEDICAL HISTORY:  Active Ambulatory Problems    Diagnosis Date Noted   Acute metabolic encephalopathy 16/04/9603   Aspiration pneumonia ([place]) 03/20/2021   Dementia ([place]) 03/20/2021   Dysphagia 03/20/2021   Malnutrition of moderate degree 03/21/2021   HCAP (healthcare-associated pneumonia) 04/20/2021   CKD (chronic kidney disease), stage III ([place]) 04/20/2021   Bradycardia 04/20/2021   Severe sepsis ([place]) 04/20/2021   Resolved Ambulatory Problems    Diagnosis Date Noted   No Resolved Ambulatory Problems   Past Medical History:  Diagnosis Date   Hypertension    Uses feeding tube     SOCIAL HX:  Social History   Tobacco Use   Smoking status: Light Smoker   Smokeless tobacco: Never  Substance Use Topics   Alcohol use: Yes     FAMILY HX: Daughter - [name] did not have any family history.    ALLERGIES: No Known Allergies    PERTINENT MEDICATIONS:  Outpatient Encounter Medications as of 04/29/2021  Medication Sig   acetaminophen (TYLENOL) 325 MG tablet Place 650 mg into feeding tube every 6 (six) hours as needed for pain.   amoxicillin-clavulanate (AUGMENTIN) 400-57 MG/5ML suspension Place 10.9 mLs (875 mg total) into feeding tube every 12 (twelve) hours.   diclofenac Sodium (VOLTAREN) 1 % GEL Apply 2 g topically 2 (two) times daily. Apply to right knee   divalproex (DEPAKOTE SPRINKLE) 125 MG capsule Take 125 mg by mouth at bedtime.   famotidine (PEPCID) 20 MG tablet Place 20 mg into feeding tube at bedtime.   melatonin 3 MG TABS tablet Place 6 mg into feeding tube at bedtime.   Nutritional Supplements (FEEDING SUPPLEMENT, JEVITY  1.5 CAL/FIBER,) LIQD Place 1,000 mLs into feeding tube continuous.   Nutritional Supplements (FEEDING SUPPLEMENT, PROSOURCE TF,) liquid Place 45 mLs into feeding tube 2 (two) times daily.   nystatin (MYCOSTATIN) 100000 UNIT/ML suspension Take 5 mLs by mouth 4 (four) times daily. 5 day supply   polyethylene glycol powder (GLYCOLAX/MIRALAX) 17 GM/SCOOP powder Place 17 g into feeding tube daily. Mix with 8oz of fluid   QUEtiapine (SEROQUEL) 50 MG tablet Place 1 tablet (50 mg total) into feeding tube at bedtime. (Patient taking differently: Place 50 mg into feeding tube 2 (two) times daily.)   Water For Irrigation, Sterile (FREE WATER) SOLN Place 150 mLs into feeding tube every 4 (four) hours.   No facility-administered encounter medications on file as of 04/29/2021.   Thank you for the opportunity to participate in the care of Jack Rivas.  The palliative care team will continue to follow. Please call our office at [phone] if we can be of additional assistance.   Note: Portions of this note were generated with L[employer]ist. Dictation errors may occur despite best attempts at proofreading.  Teodoro Spray, NP

## 2021-05-10 ENCOUNTER — Emergency Department (HOSPITAL_COMMUNITY): Payer: Medicare Other

## 2021-05-10 ENCOUNTER — Encounter (HOSPITAL_COMMUNITY): Payer: Self-pay | Admitting: Emergency Medicine

## 2021-05-10 ENCOUNTER — Inpatient Hospital Stay (HOSPITAL_COMMUNITY): Payer: Medicare Other

## 2021-05-10 ENCOUNTER — Inpatient Hospital Stay (HOSPITAL_COMMUNITY)
Admission: EM | Admit: 2021-05-10 | Discharge: 2021-05-19 | DRG: 177 | Disposition: A | Discharge status: Skilled Nursing Facility | Payer: Medicare Other | Source: Skilled Nursing Facility | Attending: Internal Medicine | Admitting: Internal Medicine

## 2021-05-10 DIAGNOSIS — J189 Pneumonia, unspecified organism: Secondary | ICD-10-CM

## 2021-05-10 DIAGNOSIS — Z20822 Contact with and (suspected) exposure to covid-19: Secondary | ICD-10-CM | POA: Diagnosis present

## 2021-05-10 DIAGNOSIS — Z931 Gastrostomy status: Secondary | ICD-10-CM | POA: Diagnosis not present

## 2021-05-10 DIAGNOSIS — R9431 Abnormal electrocardiogram [ECG] [EKG]: Secondary | ICD-10-CM | POA: Diagnosis not present

## 2021-05-10 DIAGNOSIS — R0602 Shortness of breath: Secondary | ICD-10-CM | POA: Diagnosis not present

## 2021-05-10 DIAGNOSIS — R1310 Dysphagia, unspecified: Secondary | ICD-10-CM | POA: Diagnosis present

## 2021-05-10 DIAGNOSIS — G9341 Metabolic encephalopathy: Secondary | ICD-10-CM | POA: Diagnosis present

## 2021-05-10 DIAGNOSIS — E162 Hypoglycemia, unspecified: Secondary | ICD-10-CM | POA: Diagnosis present

## 2021-05-10 DIAGNOSIS — Z79899 Other long term (current) drug therapy: Secondary | ICD-10-CM | POA: Diagnosis not present

## 2021-05-10 DIAGNOSIS — Z79890 Hormone replacement therapy (postmenopausal): Secondary | ICD-10-CM

## 2021-05-10 DIAGNOSIS — Z7401 Bed confinement status: Secondary | ICD-10-CM | POA: Diagnosis not present

## 2021-05-10 DIAGNOSIS — E870 Hyperosmolality and hypernatremia: Secondary | ICD-10-CM | POA: Diagnosis present

## 2021-05-10 DIAGNOSIS — Z9181 History of falling: Secondary | ICD-10-CM | POA: Diagnosis not present

## 2021-05-10 DIAGNOSIS — J0100 Acute maxillary sinusitis, unspecified: Secondary | ICD-10-CM | POA: Diagnosis present

## 2021-05-10 DIAGNOSIS — R4182 Altered mental status, unspecified: Secondary | ICD-10-CM | POA: Diagnosis present

## 2021-05-10 DIAGNOSIS — J690 Pneumonitis due to inhalation of food and vomit: Principal | ICD-10-CM | POA: Diagnosis present

## 2021-05-10 DIAGNOSIS — N1832 Chronic kidney disease, stage 3b: Secondary | ICD-10-CM | POA: Diagnosis present

## 2021-05-10 DIAGNOSIS — H9190 Unspecified hearing loss, unspecified ear: Secondary | ICD-10-CM | POA: Diagnosis present

## 2021-05-10 DIAGNOSIS — F17200 Nicotine dependence, unspecified, uncomplicated: Secondary | ICD-10-CM | POA: Diagnosis present

## 2021-05-10 DIAGNOSIS — F0390 Unspecified dementia without behavioral disturbance: Secondary | ICD-10-CM | POA: Diagnosis present

## 2021-05-10 DIAGNOSIS — I472 Ventricular tachycardia, unspecified: Secondary | ICD-10-CM | POA: Diagnosis present

## 2021-05-10 DIAGNOSIS — E860 Dehydration: Secondary | ICD-10-CM | POA: Diagnosis present

## 2021-05-10 DIAGNOSIS — I129 Hypertensive chronic kidney disease with stage 1 through stage 4 chronic kidney disease, or unspecified chronic kidney disease: Secondary | ICD-10-CM | POA: Diagnosis present

## 2021-05-10 LAB — COMPREHENSIVE METABOLIC PANEL
ALT: 123 U/L — ABNORMAL HIGH (ref 0–44)
AST: 56 U/L — ABNORMAL HIGH (ref 15–41)
Albumin: 3.3 g/dL — ABNORMAL LOW (ref 3.5–5.0)
Alkaline Phosphatase: 91 U/L (ref 38–126)
Anion gap: 7 (ref 5–15)
BUN: 69 mg/dL — ABNORMAL HIGH (ref 8–23)
CO2: 27 mmol/L (ref 22–32)
Calcium: 8.3 mg/dL — ABNORMAL LOW (ref 8.9–10.3)
Chloride: 116 mmol/L — ABNORMAL HIGH (ref 98–111)
Creatinine, Ser: 1.65 mg/dL — ABNORMAL HIGH (ref 0.61–1.24)
GFR, Estimated: 43 mL/min — ABNORMAL LOW (ref >60)
Glucose, Bld: 93 mg/dL (ref 70–99)
Potassium: 4.9 mmol/L (ref 3.5–5.1)
Sodium: 150 mmol/L — ABNORMAL HIGH (ref 135–145)
Total Bilirubin: 0.3 mg/dL (ref 0.3–1.2)
Total Protein: 7.4 g/dL (ref 6.5–8.1)

## 2021-05-10 LAB — BLOOD GAS, VENOUS
Acid-Base Excess: 2.1 mmol/L — ABNORMAL HIGH (ref 0.0–2.0)
Bicarbonate: 27.5 mmol/L (ref 20.0–28.0)
O2 Saturation: 89.9 %
Patient temperature: 98.6
pCO2, Ven: 50 mmHg (ref 44.0–60.0)
pH, Ven: 7.359 (ref 7.250–7.430)
pO2, Ven: 62.8 mmHg — ABNORMAL HIGH (ref 32.0–45.0)

## 2021-05-10 LAB — CBC WITH DIFFERENTIAL/PLATELET
Abs Immature Granulocytes: 0.01 10*3/uL (ref 0.00–0.07)
Basophils Absolute: 0 10*3/uL (ref 0.0–0.1)
Basophils Relative: 1 %
Eosinophils Absolute: 0.4 10*3/uL (ref 0.0–0.5)
Eosinophils Relative: 9 %
HCT: 31.4 % — ABNORMAL LOW (ref 39.0–52.0)
Hemoglobin: 9.9 g/dL — ABNORMAL LOW (ref 13.0–17.0)
Immature Granulocytes: 0 %
Lymphocytes Relative: 32 %
Lymphs Abs: 1.4 10*3/uL (ref 0.7–4.0)
MCH: 30.3 pg (ref 26.0–34.0)
MCHC: 31.5 g/dL (ref 30.0–36.0)
MCV: 96 fL (ref 80.0–100.0)
Monocytes Absolute: 0.3 10*3/uL (ref 0.1–1.0)
Monocytes Relative: 7 %
Neutro Abs: 2.2 10*3/uL (ref 1.7–7.7)
Neutrophils Relative %: 51 %
Platelets: 210 10*3/uL (ref 150–400)
RBC: 3.27 MIL/uL — ABNORMAL LOW (ref 4.22–5.81)
RDW: 13.4 % (ref 11.5–15.5)
WBC: 4.3 10*3/uL (ref 4.0–10.5)
nRBC: 0 % (ref 0.0–0.2)

## 2021-05-10 LAB — RESP PANEL BY RT-PCR (FLU A&B, COVID) ARPGX2
Influenza A by PCR: NEGATIVE
Influenza B by PCR: NEGATIVE
SARS Coronavirus 2 by RT PCR: NEGATIVE

## 2021-05-10 LAB — BRAIN NATRIURETIC PEPTIDE: B Natriuretic Peptide: 53.6 pg/mL (ref 0.0–100.0)

## 2021-05-10 LAB — AMMONIA: Ammonia: 44 umol/L — ABNORMAL HIGH (ref 9–35)

## 2021-05-10 LAB — VALPROIC ACID LEVEL: Valproic Acid Lvl: <10 ug/mL — ABNORMAL LOW (ref 50.0–100.0)

## 2021-05-10 MED ORDER — SODIUM CHLORIDE 0.9 % IV SOLN
2.0000 g | Freq: Once | INTRAVENOUS | Status: AC
  Administered 2021-05-10: 2 g via INTRAVENOUS
  Filled 2021-05-10: qty 2

## 2021-05-10 MED ORDER — LACTATED RINGERS IV SOLN: INTRAVENOUS | Status: AC

## 2021-05-10 MED ORDER — SODIUM CHLORIDE 0.9 % IV SOLN
2.0000 g | Freq: Two times a day (BID) | INTRAVENOUS | Status: DC
  Administered 2021-05-11 – 2021-05-12 (×3): 2 g via INTRAVENOUS
  Filled 2021-05-10 (×3): qty 2

## 2021-05-10 MED ORDER — NALOXONE HCL 0.4 MG/ML IJ SOLN: 0.4000 mg | INTRAMUSCULAR | Status: DC | PRN

## 2021-05-10 MED ORDER — SODIUM CHLORIDE 0.9 % IV BOLUS
1000.0000 mL | Freq: Once | INTRAVENOUS | Status: AC
  Administered 2021-05-10: 1000 mL via INTRAVENOUS

## 2021-05-10 MED ORDER — VANCOMYCIN HCL 2000 MG/400ML IV SOLN
2000.0000 mg | Freq: Once | INTRAVENOUS | Status: AC
  Administered 2021-05-10: 2000 mg via INTRAVENOUS
  Filled 2021-05-10: qty 400

## 2021-05-10 MED ORDER — NALOXONE HCL 0.4 MG/ML IJ SOLN
0.4000 mg | Freq: Once | INTRAMUSCULAR | Status: AC
  Administered 2021-05-10: 0.4 mg via INTRAVENOUS
  Filled 2021-05-10: qty 1

## 2021-05-10 MED ORDER — DIVALPROEX SODIUM 500 MG PO DR TAB: 500.0000 mg | DELAYED_RELEASE_TABLET | Freq: Once | ORAL | Status: DC

## 2021-05-10 NOTE — ED Provider Notes (Signed)
[facility]-EMERGENCY DEPT Provider Note   CSN: [id number] Arrival date & time: 05/10/21  1149     History Chief Complaint  Patient presents with   Fatigue    [place] is a 75 y.o. male.  HPI Patient presents from nursing facility via EMS transport.  History is obtained by those individuals as the patient has dementia, is minimally verbal at baseline.  Level 5 caveat.  Per EMS report the patient was possibly dyspneic, very apneic, listless this morning.  On arrival the patient was awake, though with possible respiratory rate less than 10 with small pupils.  Patient did receive Narcan, as well as fluids, had improved respiratory rate, interactivity, and his pupils reportedly enlarged.  The patient cannot provide any details of his history. No report of recent fall, fever, medication changes.  On chart review the patient was admitted, discharged from this facility about 2 weeks ago after admission for pneumonia. During that admission the patient had palliative consult.  In summary, the patient has previously been DNR, but is currently full code.  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  Family is seemingly considering palliative versus ongoing aggressive care.     Past Medical History:  Diagnosis Date   Dementia (HCC)    Hypertension    Uses feeding tube     Patient Active Problem List   Diagnosis Date Noted   HCAP (healthcare-associated pneumonia) 04/20/2021   CKD (chronic kidney disease), stage III (HCC) 04/20/2021   Bradycardia 04/20/2021   Severe sepsis (HCC) 04/20/2021   Malnutrition of moderate degree 03/21/2021   Acute metabolic encephalopathy 03/20/2021   Aspiration pneumonia (HCC) 03/20/2021   Dementia (HCC) 03/20/2021   Dysphagia 03/20/2021    Past Surgical History:  Procedure Laterality Date   CATARACT EXTRACTION, BILATERAL      KNEE SURGERY         No family history on file.  Social History   Tobacco Use   Smoking status: Light Smoker   Smokeless tobacco: Never  Substance Use Topics   Alcohol use: Yes   Drug use: Never    Home Medications Prior to Admission medications   Medication Sig Start Date End Date Taking? Authorizing Provider  acetaminophen (TYLENOL) 325 MG tablet Place 650 mg into feeding tube every 6 (six) hours as needed for pain. 03/15/21   [provider]  amoxicillin-clavulanate (AUGMENTIN) 400-57 MG/5ML suspension Place 10.9 mLs (875 mg total) into feeding tube every 12 (twelve) hours. 04/26/21   Lorin Glass, MD  diclofenac Sodium (VOLTAREN) 1 % GEL Apply 2 g topically 2 (two) times daily. Apply to right knee 08/26/19   [provider]  divalproex (DEPAKOTE SPRINKLE) 125 MG capsule Take 125 mg by mouth at bedtime.    [provider]  famotidine (PEPCID) 20 MG tablet Place 20 mg into feeding tube at bedtime. 03/15/21   [provider]  melatonin 3 MG TABS tablet Place 6 mg into feeding tube at bedtime. 03/15/21   [provider]  Nutritional Supplements (FEEDING SUPPLEMENT, JEVITY 1.5 CAL/FIBER,) LIQD Place 1,000 mLs into feeding tube continuous. 04/26/21   Lorin Glass, MD  Nutritional Supplements (FEEDING SUPPLEMENT, PROSOURCE TF,) liquid Place 45 mLs into feeding tube 2 (two) times daily. 04/26/21   Dahal, Melina Schools, MD  nystatin (MYCOSTATIN) 100000 UNIT/ML suspension Take 5 mLs by mouth 4 (four) times daily. 5 day supply    [provider]  polyethylene  glycol powder (GLYCOLAX/MIRALAX) 17 GM/SCOOP powder Place 17 g into feeding tube daily. Mix with 8oz of fluid 03/15/21   [provider]  QUEtiapine (SEROQUEL) 50 MG tablet Place 1 tablet (50 mg total) into feeding tube at bedtime. Patient taking differently: Place 50 mg into feeding tube 2 (two) times daily. 03/28/21   Kathlen Mody, MD  Water For Irrigation, Sterile (FREE WATER) SOLN  Place 150 mLs into feeding tube every 4 (four) hours. 03/28/21   Kathlen Mody, MD    Allergies    Patient has no known allergies.  Review of Systems   Review of Systems  Unable to perform ROS: Dementia   Physical Exam Updated Vital Signs BP 112/67   Pulse (!) 55   Temp (!) 96.5 F (35.8 C) (Rectal)   Resp 12   SpO2 100%   Physical Exam Vitals and nursing note reviewed.  Constitutional:      Appearance: He is well-developed. He is ill-appearing.     Comments: Elderly male sitting upright stammering speech, increased WOB  HENT:     Head: Normocephalic and atraumatic.  Eyes:     Conjunctiva/sclera: Conjunctivae normal.  Cardiovascular:     Rate and Rhythm: Regular rhythm. Tachycardia present.  Pulmonary:     Effort: Tachypnea present.     Breath sounds: No stridor. Decreased breath sounds present.  Abdominal:     General: There is no distension.     Comments: No guarding.  PEG tube.  Musculoskeletal:        General: No deformity.  Skin:    General: Skin is warm and dry.  Neurological:     Comments: Patient awake, following activity around him, trying to respond to questions, but responses are difficult to understand, inconsistent, brief.  No gross facial asymmetry. There is diffuse atrophy.  Patient does not follow commands reliably, does move his left extremities spontaneously.  Psychiatric:        Cognition and Memory: Cognition is impaired. Memory is impaired.    ED Results / Procedures / Treatments   Labs (all labs ordered are listed, but only abnormal results are displayed) Labs Reviewed  COMPREHENSIVE METABOLIC PANEL - Abnormal; Notable for the following components:      Result Value   Sodium 150 (*)    Chloride 116 (*)    BUN 69 (*)    Creatinine, Ser 1.65 (*)    Calcium 8.3 (*)    Albumin 3.3 (*)    AST 56 (*)    ALT 123 (*)    GFR, Estimated 43 (*)    All other components within normal limits  CBC WITH DIFFERENTIAL/PLATELET - Abnormal; Notable for  the following components:   RBC 3.27 (*)    Hemoglobin 9.9 (*)    HCT 31.4 (*)    All other components within normal limits  VALPROIC ACID LEVEL - Abnormal; Notable for the following components:   Valproic Acid Lvl <10 (*)    All other components within normal limits  RESP PANEL BY RT-PCR (FLU A&B, COVID) [id number]  BRAIN NATRIURETIC PEPTIDE    EKG EKG Interpretation  Date/Time:  Tuesday May 10 2021 14:40:46 EDT Ventricular Rate:  56 PR Interval:    QRS Duration: 114 QT Interval:  463 QTC Calculation: 447 R Axis:   -24 Text Interpretation: Atrial fibrillation Borderline intraventricular conduction delay Borderline low voltage, extremity leads Artifact Abnormal ECG Confirmed by Gerhard Munch [phone]) on 05/10/2021 4:31:49 PM  Radiology DG Chest 2 View  Result Date:  05/10/2021 CLINICAL DATA:  Shortness of breath EXAM: CHEST - 2 VIEW COMPARISON:  04/20/2021 FINDINGS: Stable cardiomediastinal contours. Atherosclerotic calcification of the aortic knob. Small rounded opacity within the left upper lobe is new from prior. Right lung is clear. No pleural effusion or pneumothorax. IMPRESSION: Small rounded opacity within the left upper lobe, new from prior, and may reflect a small focus of infection or inflammation. Radiographic follow-up is recommended to document resolution. Electronically Signed   By: Duanne Guess D.O.   On: 05/10/2021 13:10    Procedures Procedures   Medications Ordered in ED Medications  sodium chloride 0.9 % bolus 1,000 mL (has no administration in time range)  ceFEPIme (MAXIPIME) 2 g in sodium chloride 0.9 % 100 mL IVPB (has no administration in time range)  divalproex (DEPAKOTE) DR tablet 500 mg (has no administration in time range)    ED Course  I have reviewed the triage vital signs and the nursing notes.  Pertinent labs & imaging results that were available during my care of the patient were reviewed by me and considered in my medical decision making  (see chart for details).   4:34 PM Patient in similar condition, under a Lawyer given his hypothermia. With increased work of breathing, new opacification on x-ray, hypothermia, patient started broad-spectrum antibiotics for healthcare acquired pneumonia.  Labs also notable for hypernatremia consistent with clinical evidence for dehydration.  Patient's valproic acid level is negligible, some suspicion for possible seizure-like activity possibly contributing to his listlessness earlier in the day as well. Patient will receive this medication. I discussed all findings with patient's daughter via telephone.  She is going to come to the hospital for additional conversation as needed.  She does confirm, however, the patient is full code currently. MDM Number of Diagnoses or Management Options HCAP (healthcare-associated pneumonia): new, needed workup Hypernatremia: new, needed workup SOB (shortness of breath): new, needed workup   Amount and/or Complexity of Data Reviewed Clinical lab tests: ordered and reviewed Tests in the radiology section of CPT: ordered and reviewed Tests in the medicine section of CPT: reviewed and ordered Decide to obtain previous medical records or to obtain history from someone other than the patient: yes Obtain history from someone other than the patient: yes Review and summarize past medical records: yes Discuss the patient with other providers: yes Independent visualization of images, tracings, or specimens: yes  Risk of Complications, Morbidity, and/or Mortality Presenting problems: high Diagnostic procedures: high Management options: high  Critical Care Total time providing critical care: < 30 minutes  Patient Progress Patient progress: stable    MDM Rules/Calculators/A&P  Final Clinical Impression(s) / ED Diagnoses Final diagnoses:  HCAP (healthcare-associated pneumonia)  Hypernatremia     Gerhard Munch, MD 05/10/21 1640

## 2021-05-10 NOTE — Progress Notes (Signed)
A consult was received from an ED physician for vanc per pharmacy dosing.  The patient's profile has been reviewed for ht/wt/allergies/indication/available labs.   A one time order has been placed for Vanc 2g.  Further antibiotics/pharmacy consults should be ordered by admitting physician if indicated.                       Thank you, Berkley Harvey 05/10/2021  4:30 PM

## 2021-05-10 NOTE — Progress Notes (Signed)
Pharmacy Antibiotic Note  [facility] is a 75 y.o. male admitted on 05/10/2021 with pneumonia.  Pharmacy has been consulted for cefepime dosing.  Plan: Cefepime 2g IV q12 per current renal function     Temp (24hrs), Avg:96.5 F (35.8 C), Min:96.5 F (35.8 C), Max:96.5 F (35.8 C)  Recent Labs  Lab 05/10/21 1219  WBC 4.3  CREATININE 1.65*    CrCl cannot be calculated (Unknown ideal weight.).    No Known Allergies    Thank you for allowing pharmacy to be a part of this patient's care.  Berkley Harvey 05/10/2021 7:00 PM

## 2021-05-10 NOTE — ED Triage Notes (Signed)
Per EMS-staff at facility stated that he was more somnolent than normal-states respirations more shallow than normal-no longer a DNR-2 mg of Narcan given and 500 ml of NS given in route-respirations improved and much more alert after narcan

## 2021-05-10 NOTE — ED Notes (Signed)
Daughter, [name], called and given update

## 2021-05-10 NOTE — ED Notes (Signed)
Patients daughter called and would like a call back with an update: [name] [phone]

## 2021-05-10 NOTE — H&P (Addendum)
History and Physical    [facility] GYF:[id number] DOB: 19-May-1946 DOA: 05/10/2021  PCP: Patient, No Pcp Per (Inactive)   Chief Complaint: altered mental status, change in respirations  HPI: Jack Rivas is Jack Rivas 75 y.o. male with medical history significant of dementia, dysphagia s/p feeding tube, hypertension, and recent hospital stay where he was treated for pneumonia, presenting with altered mental status, bradycardia, and shallow respirations.   History is obtained from facility staff and daughter and chart.  Patient was apparently dyspneic, apneic, listless this morning.  He received narcan and fluids with EMS which improved his respiratory rate, interactivity, and his pupils enlarged per EDP notes.    His daughter was told he was sent due to bradycardia, shallow respirations.  She notes he fell yesterday.  She's frustrated because they "keep him zonked".  She notes he hasn't walked in [name] long time.  Was using [name] wheelchair 4 months ago, but is basically bedbound now (though this is not her expectation, she expects him to improve with therapy).    His past history is unclear, he's from [place] and sounds like his daughter brought him to [place] [facility] few months ago.  He was ventilator dependent from Jan-March 2022 in hospital at [facility].  It's unclear what happened during these hospitalizations.  Review of Systems: ROS   As per HPI otherwise 10 point review of systems negative.   No Known Allergies  Past Medical History:  Diagnosis Date   Dementia (HCC)    Hypertension    Uses feeding tube     Past Surgical History:  Procedure Laterality Date   CATARACT EXTRACTION, BILATERAL     KNEE SURGERY       reports that he has been smoking. He has never used smokeless tobacco. He reports current alcohol use. He reports that he does not use drugs.  History reviewed. No pertinent family history.  Prior to Admission medications   Medication Sig Start Date End Date Taking? Authorizing Provider   acetaminophen (TYLENOL) 325 MG tablet Place 650 mg into feeding tube every 6 (six) hours as needed for pain. 03/15/21  Yes [provider]  diclofenac Sodium (VOLTAREN) 1 % GEL Apply 2 g topically See admin instructions. Apply 2 grams to the right knee 2 times Keidra Withers day for pain 08/26/19  Yes [provider]  divalproex (DEPAKOTE SPRINKLE) 125 MG capsule Take 125 mg by mouth at bedtime.   Yes [provider]  famotidine (PEPCID) 20 MG tablet Place 20 mg into feeding tube at bedtime. 03/15/21  Yes [provider]  melatonin 3 MG TABS tablet Place 6 mg into feeding tube at bedtime. 03/15/21  Yes [provider]  polyethylene glycol powder (GLYCOLAX/MIRALAX) 17 GM/SCOOP powder Place 17 g into feeding tube See admin instructions. Mix 17 grams of powder as directed and give, per G-Tube, once Spenser Cong day 03/15/21  Yes [provider]  QUEtiapine (SEROQUEL) 50 MG tablet Place 1 tablet (50 mg total) into feeding tube at bedtime. 03/28/21  Yes Kathlen Mody, MD  Nutritional Supplements (FEEDING SUPPLEMENT, JEVITY 1.5 CAL/FIBER,) LIQD Place 1,000 mLs into feeding tube continuous. 04/26/21   Lorin Glass, MD  Nutritional Supplements (FEEDING SUPPLEMENT, PROSOURCE TF,) liquid Place 45 mLs into feeding tube 2 (two) times daily. 04/26/21   Dahal, Melina Schools, MD  nystatin (MYCOSTATIN) 100000 UNIT/ML suspension Take 5 mLs by mouth 4 (four) times daily. 5 day supply    [provider]  Water For Irrigation, Sterile (FREE WATER) SOLN Place 150 mLs  into feeding tube every 4 (four) hours. 03/28/21   Kathlen Mody, MD    Physical Exam: Vitals:   05/10/21 1500 05/10/21 1530 05/10/21 1600 05/10/21 1630  BP: 112/78 101/62 112/67 106/68  Pulse:   (!) 55   Resp: 13 11 12 11   Temp:      TempSrc:      SpO2:   100%    Physical Exam Constitutional:      Comments: Staring ahead at TV in no apparent distress  HENT:     Head: Normocephalic and atraumatic.     Mouth/Throat:      Mouth: Mucous membranes are dry.  Eyes:     Pupils: Pupils are equal, round, and reactive to light.     Comments: Does not consistently track  Cardiovascular:     Rate and Rhythm: Regular rhythm. Bradycardia present.  Pulmonary:     Effort: Pulmonary effort is normal. No respiratory distress.     Breath sounds: No stridor.  Abdominal:     General: Abdomen is flat. There is no distension.     Palpations: Abdomen is soft.     Tenderness: There is no abdominal tenderness.  Skin:    General: Skin is warm and dry.  Neurological:     Comments: He's not responding meaningfully to verbal or physical stimuli  Starting straight ahead, not consistently tracking, pupils equal Moves hands Keitra Carusone little with painful stimuli Not moving his legs for me, but when I remove his socks he says something unintelligible for the first time    EKG bradycardia, background noise, regular, difficult to tell if p waves present   Labs on Admission: I have personally reviewed the patients's labs and imaging studies.  Assessment/Plan Active Problems:   Pneumonia   AMS (altered mental status)   Acute Metabolic Encephalopathy Unclear etiology -> suspect most likely infection Related to infection vs oversedation with meds (seroquel) vs opiate overdose (with response to narcan, though none prescribed on med list) vs seizure, etc Head CT without acute intracranial process Treating for possible infection at this time Hold seroquel Follow urine tox (attention to presence of opiates, which he is not prescribed) VBG, ammonia, TSH, folate, vitamin b12 EEG He's not following commands or consistently moving all extremities, suspect this is due to near obtundation, Consider additional imaging of brain as indicated if this doesn't improve  Addendum: narcan given again and pt began moving legs/upper extremities - fidgeting - he's not on pain meds per report of facility.  Follow while holding meds -> follow urine  tox.  Aspiration Pneumonia Presumed aspiration pneumonia given hx CXR with small rounded opacity within upper lobe which is new Low temperature on presentation Treating as pneumonia in setting of AMS above Continue cefepime for now No cultures collected, will hold off for now - he's already received abx Has history of dysphagia, NPO until SLP eval  Fall Found out of bed by daughter yesterday Head CT/neck CT without acute findings  Dysphagia With peg, continue tube feeding SLP eval, daughter asking about him taking liquids  Hypernatremia Follow with IVF Continue free water flushes (per outside facility, 200 cc q4 hrs)  Shallow Respirations Improved after narcan  Bradycardia Lots of noise on previous ekg, will repeat - appears regular (p waves difficult to see) Continue to monitor on tele  Dementia Delirium precautions Continue depakote (presumed for mood stabilization - per daughter, no hx seizures) Hold seroquel with AMS  History unclear, he was hospitalized on ventilator for 3 months  in [place], early 2022.  He's had peg for 5 months about.  Recently mostly bedbound, but daughter frustrated with this and thinks this is primarily due to lack of rehab (4 months ago he was using wheelchair at facility).    Seen by palliative care at last hopsitalization, full code, full scope.  Palliative following outpatient, but [name] reiterates to me today he's full code.   Will place order to get records from [facility] and [place] if possible  Admission status: Inpatient Progressive  Certification: The appropriate patient status for this patient is INPATIENT. Inpatient status is judged to be reasonable and necessary in order to provide the required intensity of service to ensure the patient's safety. The patient's presenting symptoms, physical exam findings, and initial radiographic and laboratory data in the context of their chronic comorbidities is felt to place them  at high risk for further clinical deterioration. Furthermore, it is not anticipated that the patient will be medically stable for discharge from the hospital within 2 midnights of admission.   * I certify that at the point of admission it is my clinical judgment that the patient will require inpatient hospital care spanning beyond 2 midnights from the point of admission due to high intensity of service, high risk for further deterioration and high frequency of surveillance required.Lacretia Nicks MD [facility] If 7PM-7AM, please contact night-coverage [email or web address]  05/10/2021, 6:54 PM

## 2021-05-10 NOTE — ED Notes (Signed)
Pt has a Lawyer on

## 2021-05-11 ENCOUNTER — Inpatient Hospital Stay (HOSPITAL_COMMUNITY)
Admission: EM | Admit: 2021-05-11 | Discharge: 2021-05-11 | Disposition: A | Discharge status: Home / Self Care | Payer: Medicare Other | Source: Home / Self Care | Attending: Internal Medicine | Admitting: Internal Medicine

## 2021-05-11 DIAGNOSIS — J189 Pneumonia, unspecified organism: Secondary | ICD-10-CM | POA: Diagnosis not present

## 2021-05-11 DIAGNOSIS — E870 Hyperosmolality and hypernatremia: Secondary | ICD-10-CM | POA: Diagnosis not present

## 2021-05-11 LAB — CBC
HCT: 32 % — ABNORMAL LOW (ref 39.0–52.0)
Hemoglobin: 9.6 g/dL — ABNORMAL LOW (ref 13.0–17.0)
MCH: 29.4 pg (ref 26.0–34.0)
MCHC: 30 g/dL (ref 30.0–36.0)
MCV: 98.2 fL (ref 80.0–100.0)
Platelets: 200 10*3/uL (ref 150–400)
RBC: 3.26 MIL/uL — ABNORMAL LOW (ref 4.22–5.81)
RDW: 13.5 % (ref 11.5–15.5)
WBC: 4.2 10*3/uL (ref 4.0–10.5)
nRBC: 0 % (ref 0.0–0.2)

## 2021-05-11 LAB — RAPID URINE DRUG SCREEN, HOSP PERFORMED
Amphetamines: NOT DETECTED
Barbiturates: NOT DETECTED
Benzodiazepines: NOT DETECTED
Cocaine: NOT DETECTED
Opiates: NOT DETECTED
Tetrahydrocannabinol: NOT DETECTED

## 2021-05-11 LAB — COMPREHENSIVE METABOLIC PANEL
ALT: 112 U/L — ABNORMAL HIGH (ref 0–44)
AST: 44 U/L — ABNORMAL HIGH (ref 15–41)
Albumin: 3.3 g/dL — ABNORMAL LOW (ref 3.5–5.0)
Alkaline Phosphatase: 96 U/L (ref 38–126)
Anion gap: 8 (ref 5–15)
BUN: 63 mg/dL — ABNORMAL HIGH (ref 8–23)
CO2: 27 mmol/L (ref 22–32)
Calcium: 8.7 mg/dL — ABNORMAL LOW (ref 8.9–10.3)
Chloride: 111 mmol/L (ref 98–111)
Creatinine, Ser: 1.63 mg/dL — ABNORMAL HIGH (ref 0.61–1.24)
GFR, Estimated: 44 mL/min — ABNORMAL LOW (ref >60)
Glucose, Bld: 62 mg/dL — ABNORMAL LOW (ref 70–99)
Potassium: 4.7 mmol/L (ref 3.5–5.1)
Sodium: 146 mmol/L — ABNORMAL HIGH (ref 135–145)
Total Bilirubin: 0.6 mg/dL (ref 0.3–1.2)
Total Protein: 7.5 g/dL (ref 6.5–8.1)

## 2021-05-11 LAB — URINALYSIS, ROUTINE W REFLEX MICROSCOPIC
Bacteria, UA: NONE SEEN
Bilirubin Urine: NEGATIVE
Glucose, UA: NEGATIVE mg/dL
Hgb urine dipstick: NEGATIVE
Ketones, ur: NEGATIVE mg/dL
Leukocytes,Ua: NEGATIVE
Nitrite: NEGATIVE
Protein, ur: 30 mg/dL — AB
Specific Gravity, Urine: 1.016 (ref 1.005–1.030)
pH: 7 (ref 5.0–8.0)

## 2021-05-11 LAB — CREATININE, SERUM
Creatinine, Ser: 1.65 mg/dL — ABNORMAL HIGH (ref 0.61–1.24)
GFR, Estimated: 43 mL/min — ABNORMAL LOW (ref >60)

## 2021-05-11 LAB — FOLATE: Folate: 17.9 ng/mL (ref >5.9)

## 2021-05-11 LAB — VITAMIN B12: Vitamin B-12: 1701 pg/mL — ABNORMAL HIGH (ref 180–914)

## 2021-05-11 MED ORDER — JEVITY 1.5 CAL/FIBER PO LIQD
1000.0000 mL | ORAL | Status: DC
  Administered 2021-05-11 – 2021-05-18 (×9): 1000 mL
  Filled 2021-05-11 (×16): qty 1000

## 2021-05-11 MED ORDER — FREE WATER
200.0000 mL | Status: DC
  Administered 2021-05-11 – 2021-05-19 (×51): 200 mL

## 2021-05-11 MED ORDER — ENOXAPARIN SODIUM 40 MG/0.4ML IJ SOSY
40.0000 mg | PREFILLED_SYRINGE | INTRAMUSCULAR | Status: DC
  Administered 2021-05-12 – 2021-05-19 (×8): 40 mg via SUBCUTANEOUS
  Filled 2021-05-11 (×8): qty 0.4

## 2021-05-11 MED ORDER — ACETAMINOPHEN 325 MG PO TABS: 650.0000 mg | ORAL_TABLET | Freq: Four times a day (QID) | ORAL | Status: DC | PRN

## 2021-05-11 MED ORDER — ACETAMINOPHEN 325 MG PO TABS
650.0000 mg | ORAL_TABLET | Freq: Four times a day (QID) | ORAL | Status: DC | PRN
Start: 2021-05-11 — End: 2021-05-18
  Administered 2021-05-13 – 2021-05-14 (×2): 650 mg via ORAL
  Filled 2021-05-11 (×2): qty 2

## 2021-05-11 MED ORDER — ACETAMINOPHEN 650 MG RE SUPP: 650.0000 mg | Freq: Four times a day (QID) | RECTAL | Status: DC | PRN

## 2021-05-11 MED ORDER — PROSOURCE TF PO LIQD
45.0000 mL | Freq: Two times a day (BID) | ORAL | Status: DC
  Administered 2021-05-12 – 2021-05-19 (×16): 45 mL
  Filled 2021-05-11 (×18): qty 45

## 2021-05-11 MED ORDER — DIVALPROEX SODIUM 125 MG PO CSDR: 125.0000 mg | DELAYED_RELEASE_CAPSULE | Freq: Every day | ORAL | Status: DC

## 2021-05-11 MED ORDER — QUETIAPINE FUMARATE 25 MG PO TABS
25.0000 mg | ORAL_TABLET | Freq: Every day | ORAL | Status: DC
  Administered 2021-05-12 – 2021-05-18 (×8): 25 mg
  Filled 2021-05-11 (×8): qty 1

## 2021-05-11 MED ORDER — POLYETHYLENE GLYCOL 3350 17 G PO PACK
17.0000 g | PACK | Freq: Every day | ORAL | Status: DC
  Administered 2021-05-12 – 2021-05-18 (×5): 17 g
  Filled 2021-05-11 (×7): qty 1

## 2021-05-11 MED ORDER — DIVALPROEX SODIUM 125 MG PO CSDR
125.0000 mg | DELAYED_RELEASE_CAPSULE | Freq: Every day | ORAL | Status: DC
  Filled 2021-05-11 (×2): qty 1

## 2021-05-11 NOTE — ED Notes (Signed)
EEG en route ?

## 2021-05-11 NOTE — Progress Notes (Signed)
Patient refused IV start, RN made aware. ?

## 2021-05-11 NOTE — Progress Notes (Signed)
PROGRESS NOTE  Jack Rivas ZHY:[id number] DOB: 16-Dec-1945 DOA: 05/10/2021 PCP: Patient, No Pcp Per (Inactive)   LOS: 1 day   Brief Narrative / Interim history: Jack Rivas is a 75 y.o. male with medical history significant of dementia, dysphagia s/p feeding tube, hypertension, and recent hospital stay where he was treated for pneumonia, presenting with altered mental status, bradycardia, and shallow respirations. History is obtained from facility staff and daughter and chart.  Patient was apparently dyspneic, apneic, listless this morning.  He received narcan and fluids with EMS which improved his respiratory rate, interactivity, and his pupils enlarged per EDP notes. His daughter was told he was sent due to bradycardia, shallow respirations.  She notes he fell yesterday.  She's frustrated because they "keep him zonked".  She notes he hasn't walked in a long time.  Was using a wheelchair 4 months ago, but is basically bedbound now.    His past history is unclear, he's from [place] and sounds like his daughter brought him to [place] a few months ago.  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  It's unclear what happened during these hospitalizations.  He was recently hospitalized at [facility] couple months ago for fall, failure to thrive.  During the hospitalization several attempts were made to obtain records from [facility] but no information was ever sent.  Subjective / 24h Interval events: Alert, does not speak much but tracks me with his eyes.  Assessment & Plan: Principal Problem Acute metabolic encephalopathy-etiology not entirely clear, possibly aspiration since he has chronic dysphagia and a PEG tube in place.  He has been placed on antibiotics, seems to be alert today.  Apparently responding to Narcan, however urine drug screen is negative.  Folate 17.9.  B12 1700.  TSH 5.6 in September, recheck pending.  An EEG has been ordered and is pending.  Low threshold  to obtain an MRI if he does not respond to antibiotics  Active Problems Aspiration pneumonia -he certainly is at risk, chest x-ray showed a small rounded opacity in the left upper lobe which was new.  Continue antibiotics, no cultures were collected on admission.  Speech to see as well  Hypoglycemia -this has been noted during his hospital stay at [facility], thought to be due to poor p.o. intake.  He has a PEG tube in place.  Consult RD to initiate feeding  Acute left maxillary sinusitis -picked up on the CT scan, on antibiotics as above, unclear if he has any symptoms   Fall -Found out of bed by daughter yesterday. Head CT/neck CT without acute findings  Dysphagia -With peg, continue tube feeding. SLP eval, daughter asking about him taking liquids   Hypernatremia - Follow with IVF, improving   Bradycardia -Monitor on telemetry, sinus rhythm    Dementia -Delirium precautions, continue depakote (presumed for mood stabilization - per daughter, no hx seizures). Hold seroquel with AMS   Scheduled Meds:  divalproex  125 mg Oral QHS   enoxaparin (LOVENOX) injection  40 mg Subcutaneous Q24H   feeding supplement (PROSource TF)  45 mL Per Tube BID   free water  200 mL Per Tube Q4H   polyethylene glycol  17 g Per Tube Daily   Continuous Infusions:  ceFEPime (MAXIPIME) IV 2 g (05/11/21 0502)   feeding supplement (JEVITY 1.5 CAL/FIBER)     lactated ringers Stopped (05/11/21 0621)   PRN Meds:.acetaminophen **OR** acetaminophen, naLOXone (NARCAN)  injection  Diet Orders (From admission, onward)  Start     Ordered   05/11/21 0035  Diet NPO time specified  Diet effective now        05/11/21 0035            DVT prophylaxis: enoxaparin (LOVENOX) injection 40 mg Start: 05/11/21 1000 SCDs Start: 05/11/21 0035     Code Status: Full Code  Family Communication: Will call daughter in the afternoon  Status is: Inpatient  Remains inpatient appropriate because: AMS, debility,  hypernatremia, IV antibiotics  Level of care: Progressive  Consultants:  None   Procedures:  None   Microbiology  none  Antimicrobials: Cefepime 10/25 >>   Objective: Vitals:   05/11/21 0137 05/11/21 0530 05/11/21 0610 05/11/21 0919  BP: 124/76 111/70 (!) 154/81 137/73  Pulse: 60  60 (!) 58  Resp: 18 13 10 10   Temp: (!) 96.9 F (36.1 C)     TempSrc: Axillary     SpO2: 100%  97% 100%  Weight:       No intake or output data in the 24 hours ending 05/11/21 1045 Filed Weights   05/10/21 1900  Weight: 86.3 kg    Examination:  Constitutional: NAD Eyes: no scleral icterus ENMT: Mucous membranes are moist.  Neck: normal, supple Respiratory: Shallow respirations but no significant wheezing or crackles heard. Cardiovascular: Regular rate and rhythm, no murmurs / rubs / gallops. \ Abdomen: non distended, no tenderness. Bowel sounds positive.  Musculoskeletal: no clubbing / cyanosis.  Skin: no rashes Neurologic: Does not follow commands consistently  Data Reviewed: I have independently reviewed following labs and imaging studies   CBC: Recent Labs  Lab 05/10/21 1219 05/11/21 0411  WBC 4.3 4.2  NEUTROABS 2.2  --   HGB 9.9* 9.6*  HCT 31.4* 32.0*  MCV 96.0 98.2  PLT 210 200   Basic Metabolic Panel: Recent Labs  Lab 05/10/21 1219 05/11/21 0411  NA 150* 146*  K 4.9 4.7  CL 116* 111  CO2 27 27  GLUCOSE 93 62*  BUN 69* 63*  CREATININE 1.65* 1.63*  1.65*  CALCIUM 8.3* 8.7*   Liver Function Tests: Recent Labs  Lab 05/10/21 1219 05/11/21 0411  AST 56* 44*  ALT 123* 112*  ALKPHOS 91 96  BILITOT 0.3 0.6  PROT 7.4 7.5  ALBUMIN 3.3* 3.3*   Coagulation Profile: No results for input(s): INR, PROTIME in the last 168 hours. HbA1C: No results for input(s): HGBA1C in the last 72 hours. CBG: No results for input(s): GLUCAP in the last 168 hours.  Recent Results (from the past 240 hour(s))  Resp Panel by RT-PCR (Flu A&B, Covid) Nasopharyngeal Swab      Status: None   Collection Time: 05/10/21  4:22 PM   Specimen: Nasopharyngeal Swab; Nasopharyngeal(NP) swabs in vial transport medium  Result Value Ref Range Status   SARS Coronavirus 2 by RT PCR NEGATIVE NEGATIVE Final    Comment: (NOTE) SARS-CoV-2 target nucleic acids are NOT DETECTED.  The SARS-CoV-2 RNA is generally detectable in upper respiratory specimens during the acute phase of infection. The lowest concentration of SARS-CoV-2 viral copies this assay can detect is 138 copies/mL. A negative result does not preclude SARS-Cov-2 infection and should not be used as the sole basis for treatment or other patient management decisions. A negative result may occur with  improper specimen collection/handling, submission of specimen other than nasopharyngeal swab, presence of viral mutation(s) within the areas targeted by this assay, and inadequate number of viral copies(<138 copies/mL). A negative result must be combined  with clinical observations, patient history, and epidemiological information. The expected result is Negative.  Fact Sheet for Patients:  BloggerCourse.com  Fact Sheet for Healthcare Providers:  SeriousBroker.it  This test is no t yet approved or cleared by the [place] FDA and  has been authorized for detection and/or diagnosis of SARS-CoV-2 by FDA under an Emergency Use Authorization (EUA). This EUA will remain  in effect (meaning this test can be used) for the duration of the COVID-19 declaration under Section 564(b)(1) of the Act, 21 U.S.C.section 360bbb-3(b)(1), unless the authorization is terminated  or revoked sooner.       Influenza A by PCR NEGATIVE NEGATIVE Final   Influenza B by PCR NEGATIVE NEGATIVE Final    Comment: (NOTE) The Xpert Xpress SARS-CoV-2/FLU/RSV plus assay is intended as an aid in the diagnosis of influenza from Nasopharyngeal swab specimens and should not be used as a sole basis  for treatment. Nasal washings and aspirates are unacceptable for Xpert Xpress SARS-CoV-2/FLU/RSV testing.  Fact Sheet for Patients: BloggerCourse.com  Fact Sheet for Healthcare Providers: SeriousBroker.it  This test is not yet approved or cleared by the [place] FDA and has been authorized for detection and/or diagnosis of SARS-CoV-2 by FDA under an Emergency Use Authorization (EUA). This EUA will remain in effect (meaning this test can be used) for the duration of the COVID-19 declaration under Section 564(b)(1) of the Act, 21 U.S.C. section 360bbb-3(b)(1), unless the authorization is terminated or revoked.  Performed at [facility], [address], [place], [place] [address]      Radiology Studies: DG Chest 2 View  Result Date: 05/10/2021 CLINICAL DATA:  Shortness of breath EXAM: CHEST - 2 VIEW COMPARISON:  04/20/2021 FINDINGS: Stable cardiomediastinal contours. Atherosclerotic calcification of the aortic knob. Small rounded opacity within the left upper lobe is new from prior. Right lung is clear. No pleural effusion or pneumothorax. IMPRESSION: Small rounded opacity within the left upper lobe, new from prior, and may reflect a small focus of infection or inflammation. Radiographic follow-up is recommended to document resolution. Electronically Signed   By: Duanne Guess D.O.   On: 05/10/2021 13:10   CT HEAD WO CONTRAST ( )  Result Date: 05/10/2021 CLINICAL DATA:  Mental status change. EXAM: CT HEAD WITHOUT CONTRAST CT CERVICAL SPINE WITHOUT CONTRAST TECHNIQUE: Multidetector CT imaging of the head and cervical spine was performed following the standard protocol without intravenous contrast. Multiplanar CT image reconstructions of the cervical spine were also generated. COMPARISON:  CT head 03/20/2021. FINDINGS: CT HEAD FINDINGS Brain: No evidence of acute infarction, hemorrhage, hydrocephalus,  extra-axial collection or mass lesion/mass effect. Again seen is mild diffuse atrophy and mild periventricular white matter hypodensity, likely chronic small vessel ischemic change. Vascular: No hyperdense vessel or unexpected calcification. Skull: Normal. Negative for fracture or focal lesion. Sinuses/Orbits: There are polyps or mucous retention cyst in the left ethmoid air cells, right sphenoid sinus and right maxillary sinus measuring up to 11 mm. There is an air-fluid level in the left maxillary sinus. Right mastoid effusion is present similar to the prior study. There is an old right medial orbital wall fracture. Other: None. CT CERVICAL SPINE FINDINGS Alignment: Normal. Skull base and vertebrae: No acute fracture. No primary bone lesion or focal pathologic process. Soft tissues and spinal canal: No prevertebral fluid or swelling. No visible canal hematoma. Disc levels: There is diffuse disc space narrowing and osteophyte formation compatible with moderate severe degenerative change. Disc spacer is noted at C5-C6. There is moderate  right-sided neural foraminal diffusely from C3 through C6. There is mild central canal stenosis at C4-C5 secondary to posterior disc osteophyte complex. Upper chest: Negative. Other: None. IMPRESSION: 1.  No acute intracranial process. 2. No acute fracture or traumatic subluxation of the cervical spine. 3.  Acute left maxillary sinusitis. 4.  Stable right mastoid effusion. 5.  Moderate/severe degenerative changes of the cervical spine. Electronically Signed   By: Darliss Cheney M.D.   On: 05/10/2021 18:21   CT CERVICAL SPINE WO CONTRAST  Result Date: 05/10/2021 CLINICAL DATA:  Mental status change. EXAM: CT HEAD WITHOUT CONTRAST CT CERVICAL SPINE WITHOUT CONTRAST TECHNIQUE: Multidetector CT imaging of the head and cervical spine was performed following the standard protocol without intravenous contrast. Multiplanar CT image reconstructions of the cervical spine were also  generated. COMPARISON:  CT head 03/20/2021. FINDINGS: CT HEAD FINDINGS Brain: No evidence of acute infarction, hemorrhage, hydrocephalus, extra-axial collection or mass lesion/mass effect. Again seen is mild diffuse atrophy and mild periventricular white matter hypodensity, likely chronic small vessel ischemic change. Vascular: No hyperdense vessel or unexpected calcification. Skull: Normal. Negative for fracture or focal lesion. Sinuses/Orbits: There are polyps or mucous retention cyst in the left ethmoid air cells, right sphenoid sinus and right maxillary sinus measuring up to 11 mm. There is an air-fluid level in the left maxillary sinus. Right mastoid effusion is present similar to the prior study. There is an old right medial orbital wall fracture. Other: None. CT CERVICAL SPINE FINDINGS Alignment: Normal. Skull base and vertebrae: No acute fracture. No primary bone lesion or focal pathologic process. Soft tissues and spinal canal: No prevertebral fluid or swelling. No visible canal hematoma. Disc levels: There is diffuse disc space narrowing and osteophyte formation compatible with moderate severe degenerative change. Disc spacer is noted at C5-C6. There is moderate right-sided neural foraminal diffusely from C3 through C6. There is mild central canal stenosis at C4-C5 secondary to posterior disc osteophyte complex. Upper chest: Negative. Other: None. IMPRESSION: 1.  No acute intracranial process. 2. No acute fracture or traumatic subluxation of the cervical spine. 3.  Acute left maxillary sinusitis. 4.  Stable right mastoid effusion. 5.  Moderate/severe degenerative changes of the cervical spine. Electronically Signed   By: Darliss Cheney M.D.   On: 05/10/2021 18:21    Pamella Pert, MD, PhD [facility]  Between 7 am - 7 pm I am available, please contact me via Amion (for emergencies) or Securechat (non urgent messages)  Between 7 pm - 7 am I am not available, please contact night coverage  MD/APP via Amion

## 2021-05-11 NOTE — ED Notes (Signed)
Portable equipment paged for kangaroo tube feed pump.

## 2021-05-11 NOTE — Progress Notes (Signed)
PHARMACY NOTE -  Cefepime  Pharmacy has been assisting with dosing of cefepime for PNA. Dosage remains stable at 2g IV q12 hr and further renal adjustments per institutional Pharmacy antibiotic protocol  Pharmacy will sign off, following peripherally for culture results or dose adjustments. Please reconsult if a change in clinical status warrants re-evaluation of dosage.  Bernadene Person, PharmD, BCPS [phone] 05/11/2021, 2:09 PM

## 2021-05-11 NOTE — ED Notes (Signed)
EEG at the bedside  ?

## 2021-05-11 NOTE — ED Notes (Signed)
Repeat rectal temp 96.55F. Bear hugger remains in place at this time. Will continue to monitor.

## 2021-05-11 NOTE — Progress Notes (Signed)
EEG completed, results pending. 

## 2021-05-11 NOTE — ED Notes (Signed)
Hospitalist notified of pt's 95.26F rectal temp. Bear hugger placed at this time and warm blankets. Awaiting additional orders. Will continue to monitor.

## 2021-05-12 DIAGNOSIS — R4182 Altered mental status, unspecified: Secondary | ICD-10-CM

## 2021-05-12 DIAGNOSIS — E870 Hyperosmolality and hypernatremia: Secondary | ICD-10-CM | POA: Diagnosis not present

## 2021-05-12 DIAGNOSIS — J189 Pneumonia, unspecified organism: Secondary | ICD-10-CM | POA: Diagnosis not present

## 2021-05-12 LAB — COMPREHENSIVE METABOLIC PANEL
ALT: 106 U/L — ABNORMAL HIGH (ref 0–44)
AST: 51 U/L — ABNORMAL HIGH (ref 15–41)
Albumin: 3.5 g/dL (ref 3.5–5.0)
Alkaline Phosphatase: 103 U/L (ref 38–126)
Anion gap: 7 (ref 5–15)
BUN: 58 mg/dL — ABNORMAL HIGH (ref 8–23)
CO2: 27 mmol/L (ref 22–32)
Calcium: 9 mg/dL (ref 8.9–10.3)
Chloride: 115 mmol/L — ABNORMAL HIGH (ref 98–111)
Creatinine, Ser: 1.62 mg/dL — ABNORMAL HIGH (ref 0.61–1.24)
GFR, Estimated: 44 mL/min — ABNORMAL LOW (ref >60)
Glucose, Bld: 67 mg/dL — ABNORMAL LOW (ref 70–99)
Potassium: 4.9 mmol/L (ref 3.5–5.1)
Sodium: 149 mmol/L — ABNORMAL HIGH (ref 135–145)
Total Bilirubin: 0.6 mg/dL (ref 0.3–1.2)
Total Protein: 8.1 g/dL (ref 6.5–8.1)

## 2021-05-12 LAB — CBG MONITORING, ED
Glucose-Capillary: 63 mg/dL — ABNORMAL LOW (ref 70–99)
Glucose-Capillary: 98 mg/dL (ref 70–99)
Glucose-Capillary: 133 mg/dL — ABNORMAL HIGH (ref 70–99)

## 2021-05-12 LAB — CBC
HCT: 32.5 % — ABNORMAL LOW (ref 39.0–52.0)
Hemoglobin: 10.1 g/dL — ABNORMAL LOW (ref 13.0–17.0)
MCH: 30 pg (ref 26.0–34.0)
MCHC: 31.1 g/dL (ref 30.0–36.0)
MCV: 96.4 fL (ref 80.0–100.0)
Platelets: 187 10*3/uL (ref 150–400)
RBC: 3.37 MIL/uL — ABNORMAL LOW (ref 4.22–5.81)
RDW: 13.2 % (ref 11.5–15.5)
WBC: 4.6 10*3/uL (ref 4.0–10.5)
nRBC: 0 % (ref 0.0–0.2)

## 2021-05-12 LAB — MAGNESIUM: Magnesium: 3 mg/dL — ABNORMAL HIGH (ref 1.7–2.4)

## 2021-05-12 LAB — TSH: TSH: 4.334 u[IU]/mL (ref 0.350–4.500)

## 2021-05-12 MED ORDER — PIPERACILLIN-TAZOBACTAM 3.375 G IVPB
3.3750 g | Freq: Three times a day (TID) | INTRAVENOUS | Status: DC
  Administered 2021-05-12 – 2021-05-13 (×3): 3.375 g via INTRAVENOUS
  Filled 2021-05-12 (×4): qty 50

## 2021-05-12 MED ORDER — DEXTROSE 50 % IV SOLN
25.0000 mL | Freq: Once | INTRAVENOUS | Status: AC
  Administered 2021-05-12: 25 mL via INTRAVENOUS
  Filled 2021-05-12: qty 50

## 2021-05-12 MED ORDER — DEXTROSE 5 % IV SOLN: INTRAVENOUS | Status: DC

## 2021-05-12 NOTE — Progress Notes (Signed)
Pharmacy Antibiotic Note  Jack Rivas is a 75 y.o. male admitted on 05/10/2021 with AMS. Antibiotics initiated for aspiration PNA. Pharmacy has been consulted for piperacillin/tazobactam dosing.   Today, 05/12/21 WBC WNL SCr 1.62, CrCl 47 mL/min  Antibiotics being transitioned from cefepime to piperacillin/tazobactam.   Plan: Piperacillin/tazobactam 3.375 g IV q8h EI  Renal function stable, no cultures. Pharmacy to sign off. Please re-consult if needed.   Weight: 86.3 kg (190 lb 4.1 oz)  Temp (24hrs), Avg:96.5 F (35.8 C), Min:95.4 F (35.2 C), Max:97.2 F (36.2 C)  Recent Labs  Lab 05/10/21 1219 05/11/21 0411 05/12/21 0127  WBC 4.3 4.2 4.6  CREATININE 1.65* 1.63*  1.65* 1.62*    Estimated Creatinine Clearance: 47.1 mL/min (A) (by C-G formula based on SCr of 1.62 mg/dL (H)).    No Known Allergies  Antimicrobials this admission: Piperacillin/tazobactam 10/27 >> Cefepime 10/25 >> 10/27 Vancomycin x1 on  10/25 >>   Dose adjustments this admission:  Microbiology results: None  Thank you for allowing pharmacy to be a part of this patient's care.  Cindi Carbon, PharmD 05/12/2021 11:30 AM

## 2021-05-12 NOTE — Evaluation (Signed)
Physical Therapy Evaluation Patient Details Name: Jack Rivas MRN: [id number] DOB: 12-07-1945 Today's Date: 05/12/2021  History of Present Illness  Jack Rivas is a 75 y.o. male with medical history significant of dementia, dysphagia s/p feeding tube, hypertension, and recent hospital stay where he was treated for pneumonia, presenting 05/10/21 with altered mental status, bradycardia, and shallow respirations. PMh includes log hospitalization, VDRF, trach(now removed).  Clinical Impression  Patient  known to therapy from 2 previous admissions. Patient demonstrates  no changes from previous functional mobility. Patient was not aggressive during  therapy. Patient  required + 2 total assist to sit up, once sitting , did  sit without support  and close guard, with posterior bias and legs flexed. Patient does not demonstrate  strength and coordination to attempt standing.  Patient stated his name and BD.   Patient comes from SNF, recommend to return. PT will sign off.      Recommendations for follow up therapy are one component of a multi-disciplinary discharge planning process, led by the attending physician.  Recommendations may be updated based on patient status, additional functional criteria and insurance authorization.  Follow Up Recommendations Long-term institutional care without follow-up therapy- Patient has no change in  function since last admit  2 weeks ago.    Assistance Recommended at Discharge    Functional Status Assessment Patient has not had a recent decline in their functional status  Equipment Recommendations  None recommended by PT    Recommendations for Other Services       Precautions / Restrictions Precautions Precautions: Fall Precaution Comments: PEG, mit on Left hand      Mobility  Bed Mobility   Bed Mobility: Rolling Rolling: +2 for physical assistance;+2 for safety/equipment;Total assist   Supine to sit: +2 for physical assistance;+2 for  safety/equipment;HOB elevated;Total assist Sit to supine: Total assist;+2 for physical assistance;+2 for safety/equipment;HOB elevated   General bed mobility comments: no active participation from pt ,was restive the firs attempts. On 3rd attempt, more awake and allowed therapist  to sit him up. +2 max to return to supime.not agitated. .    Transfers                   General transfer comment: NT    Ambulation/Gait                Stairs            Wheelchair Mobility    Modified Rankin (Stroke Patients Only)       Balance Overall balance assessment: Needs assistance Sitting-balance support: Feet unsupported Sitting balance-Leahy Scale: Poor Sitting balance - Comments: once in sitting, patient with posterior bias but holding self up, using core and legs lifted from bed  for counter balance. Sat x ~ 4 minutes. Postural control: Posterior lean                                   Pertinent Vitals/Pain Faces Pain Scale: No hurt    Home Living Family/patient expects to be discharged to:: Skilled nursing facility                        Prior Function                 ADLs Comments: no family present. [place] has been in SNF for several months. Known to this Probation officer from early 10/22 admission. Patient was +  2 max/total assist then     Hand Dominance        Extremity/Trunk Assessment        Lower Extremity Assessment Lower Extremity Assessment: RLE deficits/detail;LLE deficits/detail RLE Deficits / Details: Keeps legs tensed in  extension, assisted to flex knee and hip, patient then left the leg flexed in the air. Patient did  attempt knee extension when sitting on bed edge. does not follw to move ankles LLE Deficits / Details: similar to right but less strength on the leg.       Communication   Communication: Receptive difficulties;Expressive difficulties  Cognition Arousal/Alertness: Awake/alert;Lethargic Behavior  During Therapy: Flat affect Overall Cognitive Status: History of cognitive impairments - at baseline Area of Impairment: Orientation;Following commands                       Following Commands: Follows one step commands inconsistently       General Comments: patient initially not arousing, improved with attempting to sit up but resistive. Gradually  allowed therapists to assist in sitting on bed edge. Patient able to sit witn min guard, sted his name and BD, followed simple directions for moving legs        General Comments      Exercises     Assessment/Plan    PT Assessment All further PT needs can be met in the next venue of care  PT Problem List Decreased strength;Decreased activity tolerance;Decreased balance;Decreased mobility;Decreased safety awareness;Decreased coordination;Decreased range of motion;Decreased cognition       PT Treatment Interventions      PT Goals (Current goals can be found in the Care Plan section)  Acute Rehab PT Goals PT Goal Formulation: All assessment and education complete, DC therapy    Frequency     Barriers to discharge        Co-evaluation PT/OT/SLP Co-Evaluation/Treatment: Yes Reason for Co-Treatment: For patient/therapist safety PT goals addressed during session: Mobility/safety with mobility OT goals addressed during session: ADL's and self-care       AM-PAC PT "6 Clicks" Mobility  Outcome Measure Help needed turning from your back to your side while in a flat bed without using bedrails?: Total Help needed moving from lying on your back to sitting on the side of a flat bed without using bedrails?: Total Help needed moving to and from a bed to a chair (including a wheelchair)?: Total Help needed standing up from a chair using your arms (e.g., wheelchair or bedside chair)?: Total Help needed to walk in hospital room?: Total Help needed climbing 3-5 steps with a railing? : Total 6 Click Score: 6    End of Session    Activity Tolerance: Patient tolerated treatment well Patient left: in bed;with call bell/phone within reach Nurse Communication: Mobility status;Need for lift equipment PT Visit Diagnosis: Other symptoms and signs involving the nervous system (R29.898)    Time: 0830-0900 PT Time Calculation (min) (ACUTE ONLY): 30 min   Charges:   PT Evaluation $PT Eval Low Complexity: [place] Pager [phone] Office [phone]   Claretha Cooper 05/12/2021, 10:44 AM

## 2021-05-12 NOTE — Progress Notes (Signed)
    OVERNIGHT PROGRESS REPORT  Notified by RN for CBG below acceptable limits.  RN discovered that tube feed had not been infusing for an unknown period of time. As she discovered this, she checked patient's blood glucose and found it to be 63.  For that we have administered one half amp of dextrose per hypoglycemia protocol.  RN has restarted tube feeding and medications as ordered previously and will monitor CBG accordingly.   Chinita Greenland MSNA MSN ACNPC-AG Acute Care Nurse Practitioner [facility]

## 2021-05-12 NOTE — ED Notes (Signed)
Patient Heart Rate and Pulse were low in the 40's. Patient's Doctor and Nurse has been [employer]

## 2021-05-12 NOTE — ED Notes (Signed)
Entered pt's room to administer medications, pt's tube feed was found to be unhooked and feeding tube clamped closed. Unable to determine when this occurred. Pt's CBG checked, 63. MD made aware. Pt cleaned up, medications administered, and tube feed restarted.

## 2021-05-12 NOTE — ED Notes (Signed)
Took over care of patient, cleaned patient, replaced his condom catheter. Alert will talk with unintelligible speech. Hr is 44

## 2021-05-12 NOTE — Procedures (Signed)
Patient Name: Jack Rivas  MRN: [id number]  Epilepsy Attending: Charlsie Quest  Referring Physician/Provider: Dr Lacretia Nicks Date: 05/12/2021 Duration: 22.33 mins  Patient history: 75yo M with ams. EEG to evaluate for seizure.   Level of alertness: Awake  AEDs during EEG study:   Technical aspects: This EEG study was done with scalp electrodes positioned according to the 10-20 International system of electrode placement. Electrical activity was acquired at a sampling rate of 500Hz  and reviewed with a high frequency filter of 70Hz  and a low frequency filter of 1Hz . EEG data were recorded continuously and digitally stored.   Description: EEG showed continuous generalized p 3 to 6 Hz theta-delta slowing admixed with 15 to 18 Hz beta activity with irregular morphology distributed symmetrically and diffusely. Hyperventilation and photic stimulation were not performed.     ABNORMALITY - Continuous slow, generalized  IMPRESSION: This study is suggestive of moderate diffuse encephalopathy, nonspecific etiology but could be due to toxic-metabolic causes like cefepime toxicity. No seizures or epileptiform discharges were seen throughout the recording.   Gwenda Heiner 

## 2021-05-12 NOTE — Evaluation (Signed)
Occupational Therapy Evaluation Patient Details Name: Jack Rivas MRN: [id number] DOB: 11/28/1945 Today's Date: 05/12/2021   History of Present Illness Jack Rivas is a 75 y.o. male with medical history significant of dementia, dysphagia s/p feeding tube, hypertension, and recent hospital stay where he was treated for pneumonia, presenting 05/10/21 with altered mental status, bradycardia, and shallow respirations. PMh includes log hospitalization, VDRF, trach(now removed).   Clinical Impression   Jack Rivas is a 75 year old man who presents with advanced dementia. Therapist called facility to determine patient's PLOF - though therapy notes from prior hospital stays last month also report patient requiring +2 assistance for mobility and inability to significantly follow commands. Facility reports patient total care with ADLs and mobility. On evaluation he is able to follow only a simple commands and that inconsistently. Patient requires +2 for bed mobility and does not exhibit any significant purposeful movement with his upper extremities except to scratch himself. He did not attempt to wash his face despite set up and  verbal cues. He exhibited limited tolerance when therapist passively ranged his upper extremities. Patient speaking but predominantly intelligible. Patient appears to be at his recent baseline. Also does not exhibit any cognitive ability to learn or carryover learning. Recommend patient have 24/7 custodial care but has no acute care OT needs.     Recommendations for follow up therapy are one component of a multi-disciplinary discharge planning process, led by the attending physician.  Recommendations may be updated based on patient status, additional functional criteria and insurance authorization.   Follow Up Recommendations  Long-term institutional care without follow-up therapy    Assistance Recommended at Discharge Frequent or constant Supervision/Assistance  Functional  Status Assessment  Patient has not had a recent decline in their functional status  Equipment Recommendations  None recommended by OT    Recommendations for Other Services       Precautions / Restrictions Precautions Precautions: Fall Precaution Comments: PEG, mit on Left hand Restrictions Weight Bearing Restrictions: No      Mobility Bed Mobility Overal bed mobility: Needs Assistance Bed Mobility: Rolling Rolling: +2 for physical assistance;+2 for safety/equipment;Total assist   Supine to sit: +2 for physical assistance;+2 for safety/equipment;HOB elevated;Total assist Sit to supine: Total assist;+2 for physical assistance;+2 for safety/equipment;HOB elevated   General bed mobility comments: no active participation from pt ,was resistive the first two attempts. On 3rd attempt, more awake and allowed therapist  to sit him up. +2 max to return to supime.not agitated. .    Transfers                   General transfer comment: did not attempt      Balance Overall balance assessment: Needs assistance Sitting-balance support: Feet unsupported Sitting balance-Leahy Scale: Poor Sitting balance - Comments: once in sitting, patient with posterior bias but holding self up, using core and legs lifted from bed  for counter balance. Sat x ~ 4 minutes. Postural control: Posterior lean                                 ADL either performed or assessed with clinical judgement   ADL Overall ADL's : At baseline                                       General ADL Comments:  Requires total care.     Vision   Vision Assessment?: No apparent visual deficits     Perception     Praxis      Pertinent Vitals/Pain Pain Assessment: No/denies pain Faces Pain Scale: No hurt Breathing: normal Negative Vocalization: none Facial Expression: smiling or inexpressive Body Language: relaxed Consolability: (P) no need to console PAINAD Score: (P) 0      Hand Dominance  (unknown.)   Extremity/Trunk Assessment Upper Extremity Assessment Upper Extremity Assessment: Difficult to assess due to impaired cognition (Grossly functional ROM of elbow, wrist and fingers with PROM. Shoulders somewhat stiff but could be ranged to 90 degrees.)   Lower Extremity Assessment Lower Extremity Assessment: Defer to PT evaluation RLE Deficits / Details: Keeps legs tensed in  extension, assisted to flex knee and hip, patient then left the leg flexed in the air. Patient did  attempt knee extension when sitting on bed edge. does not follw to move ankles LLE Deficits / Details: similar to right but less strength on the leg.   Cervical / Trunk Assessment Cervical / Trunk Assessment: Normal   Communication Communication Communication: Expressive difficulties   Cognition Arousal/Alertness: Lethargic (Predominantly lethargic and sleepy. Awakened briefly at side of bed.) Behavior During Therapy: Flat affect Overall Cognitive Status: History of cognitive impairments - at baseline Area of Impairment: Orientation;Following commands                       Following Commands: Follows one step commands inconsistently       General Comments: patient initially not arousing, improved with attempting to sit up but resistive. Gradually  allowed therapists to assist in sitting on bed edge. Patient able to sit witn min guard, stated his name and BD, followed simple directions for moving legs but then did not follow directions for upper extremities or to perform grooming task.     General Comments       Exercises     Shoulder Instructions      Home Living Family/patient expects to be discharged to:: Skilled nursing facility                                        Prior Functioning/Environment Prior Level of Function : Needs assist  Cognitive Assist : ADLs (cognitive)   ADLs (Cognitive): Step by step cues Physical Assist : Mobility  (physical);ADLs (physical) Mobility (physical): Bed mobility;Transfers ADLs (physical): Feeding;Grooming;Bathing;Dressing;Toileting;IADLs   ADLs Comments: [facility]. Reports patient is total assist. Patient had been receiving therapy but had not made progress. Was getting ready to attempt therapy again per RN. Patient has required +2 for physical assistance for each admission in the hospital setting with significant cognitive deficits.        OT Problem List: Impaired UE functional use;Decreased safety awareness;Decreased cognition;Decreased activity tolerance;Impaired balance (sitting and/or standing)      OT Treatment/Interventions:      OT Goals(Current goals can be found in the care plan section) Acute Rehab OT Goals OT Goal Formulation: All assessment and education complete, DC therapy  OT Frequency:     Barriers to D/C:            Co-evaluation   Reason for Co-Treatment: For patient/therapist safety PT goals addressed during session: Mobility/safety with mobility OT goals addressed during session: ADL's and self-care      AM-PAC OT "6 Clicks" Daily Activity  Outcome Measure Help from another person eating meals?: Total Help from another person taking care of personal grooming?: Total Help from another person toileting, which includes using toliet, bedpan, or urinal?: Total Help from another person bathing (including washing, rinsing, drying)?: Total Help from another person to put on and taking off regular upper body clothing?: Total Help from another person to put on and taking off regular lower body clothing?: Total 6 Click Score: 6   End of Session Nurse Communication: Mobility status  Activity Tolerance: Patient tolerated treatment well Patient left: in bed;with call bell/phone within reach  OT Visit Diagnosis: Other symptoms and signs involving cognitive function                Time: 1324-4010 OT Time Calculation (min): 20 min Charges:  OT General  Charges $OT Visit: 1 Visit OT Evaluation $OT Eval Low Complexity: 1 Low  Evaristo Tsuda, OTR/L Acute Care Rehab Services  Office [phone] Pager: [phone]   Kelli Churn 05/12/2021, 12:00 PM

## 2021-05-12 NOTE — Progress Notes (Addendum)
Clinical/Bedside Swallow Evaluation Patient Details  Name: Jack Rivas MRN: [id number] Date of Birth: 1945-11-26  Today's Date: 05/12/2021 Time: SLP Start Time (ACUTE ONLY): 1025 SLP Stop Time (ACUTE ONLY): 1115 SLP Time Calculation (min) (ACUTE ONLY): 50 min  Past Medical History:  Past Medical History:  Diagnosis Date   Dementia (HCC)    Hypertension    Uses feeding tube    Past Surgical History:  Past Surgical History:  Procedure Laterality Date   CATARACT EXTRACTION, BILATERAL     KNEE SURGERY     HPI:  pt is a 75 yo male with complex medical history including dementia, dysphagia, aspiration s/p PEG tube, respiratory failure requiring vent use for several months (Jan to April) with recurrent admits to hospital with pna.  Pt has a PEG for nutrition. Swallow evaluation ordered.  CXR during this admit showed Left Upper lobe opacity that may indicate inflammation vs infection.    Assessment / Plan / Recommendation  Clinical Impression  Jack Rivas is familiar to this SLP from prior hospitalization in 03/2021 during which time he underwent MBS.  Known h/o silent aspiration of thin (and nectar at [facility]) with dietary recommendation being dys1/nectar and teaspoons of thin.  Pt awake, legs off stretcher and severely xerostomic, dysarthric.   SLP provided oral care using toothbrush/paste and oral suction to clear dried secretions from anterior dentition.  Jack Rivas does not follow directions due to his dementia, but SLP able to understand him state that he wanted "beer".  Provided pt with po intake including thin liquid via tsp, nectar via tsp/cup and puree.  Pt with delayed swallow varying from 2 seconds to 7 seconds- followed by intermittent wet voice which he does not clear nor senses.  Pt did clear his throat per verbal cue x1 - which then resulted in mild coughing, sniffing and nonproductive "hocking".     SLP later phoned SNF and spoke to RN, Westley Hummer an SLP, Delorise Shiner, whom both report pt  has been strictly NPO following most recent hospitalization *early Oct 2022.  Charlene reports using lemon glycerine swabs for pt's oral care/comfort, SLP advised they worsen xerostomia thus recommend using toothettes for moisture.  Delorise Shiner advises that pt would gag and cough excessively when eating puree/nectar after September discharge.  Delorise Shiner reports pt would cough with both nectar and purees essentially equally, eventually ending up in rehospitalization with return to SNF with strict npo order and TF    In addition, per Delorise Shiner, pt has bouts of agitation and required medications for staff and pt safety.  Said medications which may worsen baseline mentation and increase aspiration of secretions (pt aspirates secretions at baseline).    Pt has recently undergone palliative meeting and per notes, full code status remains.  At this time, recommend NPO with adequate oral care for this unfortunate pt.  Jack Rivas clearly desires to eat/drink - but his aspiration risk is high *pt is aspirating secretions without po intake* and per palliative, pt remains a full code.     SLP previously informed Festus Barren of these findings and concerns re: aspiration risk. Unfortunately, per research, with pt's weak cough, delayed swallow and inability to follow directions, he remains a high aspiration risk. No follow up indicated at this time.   SLP Visit Diagnosis: Dysphagia, oropharyngeal phase (R13.12)    Aspiration Risk  Severe aspiration risk;Risk for inadequate nutrition/hydration    Diet Recommendation NPO        Other  Recommendations Oral Care Recommendations: Oral care QID Other  Recommendations: Have oral suction available    Recommendations for follow up therapy are one component of a multi-disciplinary discharge planning process, led by the attending physician.  Recommendations may be updated based on patient status, additional functional criteria and insurance authorization.  Follow up Recommendations N/a    Frequency and Duration   N/a         Prognosis   Poor for tolerance of po     Swallow Study   General HPI: pt is a 75 yo male with complex medical history including dementia, dysphagia, aspiration s/p PEG tube, respiratory failure requiring vent use for several months (Jan to April) with recurrent admits to hospital with pna.  Pt has a PEG for nutrition. Swallow evaluation ordered.  CXR during this admit showed Left Upper lobe opacity that may indicate inflammation vs infection. Type of Study: Bedside Swallow Evaluation Previous Swallow Assessment: last 03/25/2021 silent asp of thin, delayed swallow *8 seconds with solids at pyriform sinus with recommendation for dys1/nectar.   Pt then dc'd to SNF.  He returns with AMS, requiring bair hugger due to hypothermia. Diet Prior to this Study: NPO Temperature Spikes Noted: N/A Respiratory Status: Room air Behavior/Cognition: Alert;Doesn't follow directions Oral Cavity Assessment: Dried secretions;Dry Oral Care Completed by SLP: Yes Oral Cavity - Dentition: Adequate natural dentition Self-Feeding Abilities:  (pt has mitt in place, thus did not observe him self feeding) Patient Positioning: Upright in bed Baseline Vocal Quality: Wet;Low vocal intensity Volitional Cough: Cognitively unable to elicit Volitional Swallow: Unable to elicit    Oral/Motor/Sensory Function Overall Oral Motor/Sensory Function: Within functional limits   Ice Chips Ice chips: Not tested   Thin Liquid Thin Liquid: Impaired Presentation: Spoon Pharyngeal  Phase Impairments: Suspected delayed Swallow    Nectar Thick Nectar Thick Liquid: Impaired Presentation: Cup;Spoon;Straw Pharyngeal Phase Impairments: Suspected delayed Swallow;Wet Vocal Quality Other Comments: swallow delayed up to 7 seconds   Honey Thick Honey Thick Liquid: Not tested   Puree Puree: Impaired Presentation: Spoon Pharyngeal Phase Impairments: Suspected delayed Swallow Other Comments: delayed up to  6 secods   Solid     Solid: Not tested      Chales Abrahams 05/12/2021,12:16 PM  Rolena Infante, MS [facility] SLP Acute Rehab Services Office [phone] Pager [phone]

## 2021-05-12 NOTE — Progress Notes (Signed)
PROGRESS NOTE  Omarri Eich RDE:[id number] DOB: 04-14-1946 DOA: 05/10/2021 PCP: Patient, No Pcp Per (Inactive)   LOS: 2 days   Brief Narrative / Interim history: Elieser Tetrick is a 75 y.o. male with medical history significant of dementia, dysphagia s/p feeding tube, hypertension, and recent hospital stay where he was treated for pneumonia, presenting with altered mental status, bradycardia, and shallow respirations. History is obtained from facility staff and daughter and chart.  Patient was apparently dyspneic, apneic, listless this morning.  He received narcan and fluids with EMS which improved his respiratory rate, interactivity, and his pupils enlarged per EDP notes. His daughter was told he was sent due to bradycardia, shallow respirations.  She notes he fell yesterday.  She's frustrated because they "keep him zonked".  She notes he hasn't walked in a long time.  Was using a wheelchair 4 months ago, but is basically bedbound now.    His past history is unclear, he's from [place] and sounds like his daughter brought him to [place] a few months ago.  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  It's unclear what happened during these hospitalizations.  He was recently hospitalized at [facility] couple months ago for fall, failure to thrive.  During the hospitalization several attempts were made to obtain records from [facility] but no information was ever sent.  Subjective / 24h Interval events: Alert, tracks me with his eyes but does not answer my questions.  Per RN, overnight was occasionally impulsive  Assessment & Plan: Principal Problem Acute metabolic encephalopathy-etiology not entirely clear, possibly aspiration since he has chronic dysphagia and a PEG tube in place.  He has been placed on antibiotics, seems to be alert today.  Apparently responding to Narcan, however urine drug screen is negative.  Folate 17.9.  B12 1700.  TSH 5.6 in September, recheck currently  at 4.3.  An EEG has been ordered and is negative for seizures.   -Discontinue Depakote after discussing with the daughter yesterday, she was concerned about him being overly sedated  Active Problems Aspiration pneumonia -he certainly is at risk, chest x-ray showed a small rounded opacity in the left upper lobe which was new.  Continue antibiotics, due to persistent encephalopathy discontinued cefepime and changed to Zosyn  Hypoglycemia -this has been noted during his hospital stay at [facility], thought to be due to poor p.o. intake.  He has a PEG tube in place.  Continue tube feeds  Acute left maxillary sinusitis -picked up on the CT scan,  on antibiotics as above  Fall -Found out of bed by daughter the day prior to admission. Head CT/neck CT without acute findings  Dysphagia -With peg, continue tube feeding.  Speech evaluation still pending.  Daughter asking about him eating   Hypernatremia -change to D5W   Bradycardia -Monitor on telemetry, sinus rhythm    Dementia -Delirium precautions, continue depakote (presumed for mood stabilization - per daughter, no hx seizures). Hold seroquel with AMS   Scheduled Meds:  enoxaparin (LOVENOX) injection  40 mg Subcutaneous Q24H   feeding supplement (PROSource TF)  45 mL Per Tube BID   free water  200 mL Per Tube Q4H   polyethylene glycol  17 g Per Tube Daily   QUEtiapine  25 mg Per Tube QHS   Continuous Infusions:  dextrose 75 mL/hr at 05/12/21 0656   feeding supplement (JEVITY 1.5 CAL/FIBER) 1,000 mL (05/12/21 0546)   piperacillin-tazobactam (ZOSYN)  IV     PRN Meds:.acetaminophen **OR** acetaminophen,  naLOXone [facility])  injection  Diet Orders (From admission, onward)     Start     Ordered   05/11/21 0035  Diet NPO time specified  Diet effective now        05/11/21 0035            DVT prophylaxis: enoxaparin (LOVENOX) injection 40 mg Start: 05/11/21 1000 SCDs Start: 05/11/21 0035     Code Status: Full Code  Family  Communication: Discussed with daughter over the phone  Status is: Inpatient  Remains inpatient appropriate because: AMS, debility, hypernatremia, IV antibiotics  Level of care: Progressive  Consultants:  None   Procedures:  None   Microbiology  none  Antimicrobials: Cefepime 10/25 >>   Objective: Vitals:   05/12/21 0630 05/12/21 0700 05/12/21 0900 05/12/21 1000  BP: 122/69 118/77 123/82 124/73  Pulse: 64 64 (!) 47 (!) 59  Resp: 16 16 10 12   Temp:      TempSrc:      SpO2: 100% 100% 100% 94%  Weight:        Intake/Output Summary (Last 24 hours) at 05/12/2021 1143 Last data filed at 05/12/2021 0545 Gross per 24 hour  Intake 300 ml  Output --  Net 300 ml   Filed Weights   05/10/21 1900  Weight: 86.3 kg    Examination:  Constitutional: No apparent distress Eyes: Anicteric ENMT: mmm Neck: normal, supple Respiratory: No wheezing, no crackles Cardiovascular: Regular rate and rhythm, no murmurs, no peripheral edema Abdomen: Soft, NT, ND, bowel sounds positive Musculoskeletal: no clubbing / cyanosis.  Skin: No rashes appreciated Neurologic: Does not follow commands consistently but no apparent focal deficits  Data Reviewed: I have independently reviewed following labs and imaging studies   CBC: Recent Labs  Lab 05/10/21 1219 05/11/21 0411 05/12/21 0127  WBC 4.3 4.2 4.6  NEUTROABS 2.2  --   --   HGB 9.9* 9.6* 10.1*  HCT 31.4* 32.0* 32.5*  MCV 96.0 98.2 96.4  PLT 210 200 187    Basic Metabolic Panel: Recent Labs  Lab 05/10/21 1219 05/11/21 0411 05/12/21 0127  NA 150* 146* 149*  K 4.9 4.7 4.9  CL 116* 111 115*  CO2 27 27 27   GLUCOSE 93 62* 67*  BUN 69* 63* 58*  CREATININE 1.65* 1.63*  1.65* 1.62*  CALCIUM 8.3* 8.7* 9.0  MG  --   --  3.0*    Liver Function Tests: Recent Labs  Lab 05/10/21 1219 05/11/21 0411 05/12/21 0127  AST 56* 44* 51*  ALT 123* 112* 106*  ALKPHOS 91 96 103  BILITOT 0.3 0.6 0.6  PROT 7.4 7.5 8.1  ALBUMIN 3.3*  3.3* 3.5    Coagulation Profile: No results for input(s): INR, PROTIME in the last 168 hours. HbA1C: No results for input(s): HGBA1C in the last 72 hours. CBG: Recent Labs  Lab 05/12/21 0059 05/12/21 0405  GLUCAP 63* 98    Recent Results (from the past 240 hour(s))  Resp Panel by RT-PCR (Flu A&B, Covid) Nasopharyngeal Swab     Status: None   Collection Time: 05/10/21  4:22 PM   Specimen: Nasopharyngeal Swab; Nasopharyngeal(NP) swabs in vial transport medium  Result Value Ref Range Status   SARS Coronavirus 2 by RT PCR NEGATIVE NEGATIVE Final    Comment: (NOTE) SARS-CoV-2 target nucleic acids are NOT DETECTED.  The SARS-CoV-2 RNA is generally detectable in upper respiratory specimens during the acute phase of infection. The lowest concentration of SARS-CoV-2 viral copies this assay can detect is  138 copies/mL. A negative result does not preclude SARS-Cov-2 infection and should not be used as the sole basis for treatment or other patient management decisions. A negative result may occur with  improper specimen collection/handling, submission of specimen other than nasopharyngeal swab, presence of viral mutation(s) within the areas targeted by this assay, and inadequate number of viral copies(<138 copies/mL). A negative result must be combined with clinical observations, patient history, and epidemiological information. The expected result is Negative.  Fact Sheet for Patients:  BloggerCourse.com  Fact Sheet for Healthcare Providers:  SeriousBroker.it  This test is no t yet approved or cleared by the [place] FDA and  has been authorized for detection and/or diagnosis of SARS-CoV-2 by FDA under an Emergency Use Authorization (EUA). This EUA will remain  in effect (meaning this test can be used) for the duration of the COVID-19 declaration under Section 564(b)(1) of the Act, 21 U.S.C.section 360bbb-3(b)(1), unless the  authorization is terminated  or revoked sooner.       Influenza A by PCR NEGATIVE NEGATIVE Final   Influenza B by PCR NEGATIVE NEGATIVE Final    Comment: (NOTE) The Xpert Xpress SARS-CoV-2/FLU/RSV plus assay is intended as an aid in the diagnosis of influenza from Nasopharyngeal swab specimens and should not be used as a sole basis for treatment. Nasal washings and aspirates are unacceptable for Xpert Xpress SARS-CoV-2/FLU/RSV testing.  Fact Sheet for Patients: BloggerCourse.com  Fact Sheet for Healthcare Providers: SeriousBroker.it  This test is not yet approved or cleared by the [place] FDA and has been authorized for detection and/or diagnosis of SARS-CoV-2 by FDA under an Emergency Use Authorization (EUA). This EUA will remain in effect (meaning this test can be used) for the duration of the COVID-19 declaration under Section 564(b)(1) of the Act, 21 U.S.C. section 360bbb-3(b)(1), unless the authorization is terminated or revoked.  Performed at [facility], [address] [place], [place] [address]       Radiology Studies: EEG adult  Result Date: 05/31/2021 Charlsie Quest, MD     May 31, 2021 10:59 AM Patient Name: Azion Centrella MRN: [id number] Epilepsy Attending: Charlsie Quest Referring Physician/Provider: Dr Lacretia Nicks Date: 2021-05-31 Duration: 22.33 mins Patient history: 75yo M with ams. EEG to evaluate for seizure. Level of alertness: Awake AEDs during EEG study: Technical aspects: This EEG study was done with scalp electrodes positioned according to the 10-20 International system of electrode placement. Electrical activity was acquired at a sampling rate of 500Hz  and reviewed with a high frequency filter of 70Hz  and a low frequency filter of 1Hz . EEG data were recorded continuously and digitally stored. Description: EEG showed continuous generalized p 3 to 6 Hz theta-delta slowing admixed  with 15 to 18 Hz beta activity with irregular morphology distributed symmetrically and diffusely. Hyperventilation and photic stimulation were not performed.   ABNORMALITY - Continuous slow, generalized IMPRESSION: This study is suggestive of moderate diffuse encephalopathy, nonspecific etiology but could be due to toxic-metabolic causes like cefepime toxicity. No seizures or epileptiform discharges were seen throughout the recording. Priyanka , MD, PhD [facility]  Between 7 am - 7 pm I am available, please contact me via Amion (for emergencies) or Securechat (non urgent messages)  Between 7 pm - 7 am I am not available, please contact night coverage MD/APP via Amion

## 2021-05-13 ENCOUNTER — Other Ambulatory Visit: Payer: Self-pay

## 2021-05-13 DIAGNOSIS — E870 Hyperosmolality and hypernatremia: Secondary | ICD-10-CM | POA: Diagnosis not present

## 2021-05-13 DIAGNOSIS — J189 Pneumonia, unspecified organism: Secondary | ICD-10-CM | POA: Diagnosis not present

## 2021-05-13 LAB — CBC
HCT: 31.2 % — ABNORMAL LOW (ref 39.0–52.0)
Hemoglobin: 9.4 g/dL — ABNORMAL LOW (ref 13.0–17.0)
MCH: 29.5 pg (ref 26.0–34.0)
MCHC: 30.1 g/dL (ref 30.0–36.0)
MCV: 97.8 fL (ref 80.0–100.0)
Platelets: 154 10*3/uL (ref 150–400)
RBC: 3.19 MIL/uL — ABNORMAL LOW (ref 4.22–5.81)
RDW: 13.3 % (ref 11.5–15.5)
WBC: 3.9 10*3/uL — ABNORMAL LOW (ref 4.0–10.5)
nRBC: 0 % (ref 0.0–0.2)

## 2021-05-13 LAB — GLUCOSE, CAPILLARY
Glucose-Capillary: 119 mg/dL — ABNORMAL HIGH (ref 70–99)
Glucose-Capillary: 137 mg/dL — ABNORMAL HIGH (ref 70–99)
Glucose-Capillary: 114 mg/dL — ABNORMAL HIGH (ref 70–99)
Glucose-Capillary: 128 mg/dL — ABNORMAL HIGH (ref 70–99)

## 2021-05-13 LAB — BASIC METABOLIC PANEL
Anion gap: 7 (ref 5–15)
BUN: 50 mg/dL — ABNORMAL HIGH (ref 8–23)
CO2: 27 mmol/L (ref 22–32)
Calcium: 8.9 mg/dL (ref 8.9–10.3)
Chloride: 113 mmol/L — ABNORMAL HIGH (ref 98–111)
Creatinine, Ser: 1.56 mg/dL — ABNORMAL HIGH (ref 0.61–1.24)
GFR, Estimated: 46 mL/min — ABNORMAL LOW (ref >60)
Glucose, Bld: 138 mg/dL — ABNORMAL HIGH (ref 70–99)
Potassium: 4.6 mmol/L (ref 3.5–5.1)
Sodium: 147 mmol/L — ABNORMAL HIGH (ref 135–145)

## 2021-05-13 MED ORDER — AMOXICILLIN-POT CLAVULANATE 250-62.5 MG/5ML PO SUSR
500.0000 mg | Freq: Three times a day (TID) | ORAL | Status: DC
  Administered 2021-05-13 – 2021-05-17 (×12): 500 mg
  Filled 2021-05-13 (×13): qty 10

## 2021-05-13 NOTE — Progress Notes (Signed)
Initial Nutrition Assessment  DOCUMENTATION CODES:   Non-severe (moderate) malnutrition in context of chronic illness  INTERVENTION:   -Continue current orders for Jevity 1.5 @ 65 ml/hr via PEG -45 ml Prosource TF BID -Free water flushes of 200 ml every 4 hours (1200 ml) -This provides 2420 kcals, 121g protein and 2385 ml H2O   NUTRITION DIAGNOSIS:   Moderate Malnutrition related to chronic illness, dysphagia as evidenced by mild fat depletion, moderate muscle depletion, percent weight loss.  GOAL:   Patient will meet greater than or equal to 90% of their needs  MONITOR:   Labs, Weight trends, I & O's  REASON FOR ASSESSMENT:   Consult Enteral/tube feeding initiation and management  ASSESSMENT:   75 y.o. male with medical history significant of dementia, dysphagia s/p feeding tube, hypertension, and recent hospital stay where he was treated for pneumonia, presenting with altered mental status, bradycardia, and shallow respirations. Admitted for acute metabolic encephalopathy.   Patient in room, no family at bedside at time of visit. Pt unable to provide any history, difficult to understand. Disoriented x 4. Pt did ask for orange juice. Pt to remain strictly NPO per SLP recommendations 10/27. Per SLP note, pt was recommended to be strictly NPO at facility as well.  MD paged RD regarding free water recommendations. Pt is recommended to receive 200 ml every 4 hours to meet fluid needs on top of what is provided with formula at goal rate. Pt's daughter was reporting that the pt was not receiving any nutrition via PEG. Noted in home medication list that free water was listed as 150 ml every 4 hours. If this was the case, pt was not meeting fluid needs + protein and kcal needs. Hence why weight has continued to decrease as well.   Per weight records, pt has lost 23 lbs  since 9/9 (11% wt loss x 1.5 months, significant for time frame). Pt bedbound now. At risk of developing severe  malnutrition if nutrition support is not provided.   Medications: Miralax, D5 infusion  Labs reviewed:  CBGs: 98-137 Elevated Na  NUTRITION - FOCUSED PHYSICAL EXAM:  Flowsheet Row Most Recent Value  Orbital Region Mild depletion  Upper Arm Region Unable to assess  Thoracic and Lumbar Region Unable to assess  Buccal Region Mild depletion  Temple Region Moderate depletion  Clavicle Bone Region Moderate depletion  Clavicle and Acromion Bone Region Mild depletion  Scapular Bone Region Unable to assess  Dorsal Hand Unable to assess  [mitts]  Patellar Region Unable to assess  [unable to follow commands]  Anterior Thigh Region Unable to assess  Posterior Calf Region Unable to assess  Edema (RD Assessment) None  Hair Reviewed  Eyes Unable to assess  [unable to follow commands]  Mouth Reviewed  [dry mouth and lips]  Skin Reviewed  Nails Unable to assess  [mitts]       Diet Order:   Diet Order             Diet NPO time specified  Diet effective now                   EDUCATION NEEDS:   No education needs have been identified at this time  Skin:  Skin Assessment: Reviewed RN Assessment  Last BM:  PTA  Height:   Ht Readings from Last 1 Encounters:  04/20/21 6\' 3"  (1.905 m)    Weight:   Wt Readings from Last 1 Encounters:  05/13/21 83 kg  BMI:  Body mass index is 22.87 kg/m.  Estimated Nutritional Needs:   Kcal:  2400-2600  Protein:  120-135g  Fluid:  >2.4L  Tilda Franco, MS, RD, LDN Inpatient Clinical Dietitian Contact information available via Amion

## 2021-05-13 NOTE — Care Management Important Message (Signed)
Medicare IM printed for W/[facility] Social Work to give to the patient. 

## 2021-05-13 NOTE — Progress Notes (Addendum)
PROGRESS NOTE  Jack Rivas NID:[id number] DOB: [birth year] DOA: 05/10/2021 PCP: Patient, No Pcp Per (Inactive)   LOS: 3 days   Brief Narrative / Interim history: Jack Rivas is a 75 y.o. male with medical history significant of dementia, dysphagia s/p feeding tube, hypertension, and recent hospital stay where he was treated for pneumonia, presenting with altered mental status, bradycardia, and shallow respirations. History is obtained from facility staff and daughter and chart.  Patient was apparently dyspneic, apneic, listless this morning.  He received narcan and fluids with EMS which improved his respiratory rate, interactivity, and his pupils enlarged per EDP notes. His daughter was told he was sent due to bradycardia, shallow respirations.  She notes he fell yesterday.  She's frustrated because they "keep him zonked".  She notes he hasn't walked in a long time.  Was using a wheelchair 4 months ago, but is basically bedbound now.    Subjective / 24h Interval events: Alert, tracks me with his eyes, tries to mumble a few words but is unintelligible  Assessment & Plan: Principal Problem Acute metabolic encephalopathy-etiology not entirely clear, possibly aspiration since he has chronic dysphagia and a PEG tube in place.  He has been placed on antibiotics, seems to be alert today.  Apparently responding to Narcan, however urine drug screen is negative.  Folate 17.9.  B12 1700.  TSH 5.6 in September, recheck currently at 4.3.  An EEG has been ordered and is negative for seizures.   -Discontinued Depakote after discussing with the daughter, she was concerned about him being overly sedated  Active Problems Aspiration pneumonia -he certainly is at risk, chest x-ray showed a small rounded opacity in the left upper lobe which was new.  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 intake.  He has a PEG tube in place.  Continue tube feeds  Acute left maxillary sinusitis -picked up on the CT scan,  on antibiotics as above  Fall -Found out of bed by daughter the day prior to admission. Head CT/neck CT without acute findings  Dysphagia -With peg, continue tube feeding.     Hypernatremia -change to D5W   Bradycardia -Monitor on telemetry, sinus rhythm    Dementia -Delirium precautions, continue Seroquel   Scheduled Meds:  enoxaparin (LOVENOX) injection  40 mg Subcutaneous Q24H   feeding supplement (PROSource TF)  45 mL Per Tube BID   free water  200 mL Per Tube Q4H   polyethylene glycol  17 g Per Tube Daily   QUEtiapine  25 mg Per Tube QHS   Continuous Infusions:  dextrose 75 mL/hr at 05/13/21 0746   feeding supplement (JEVITY 1.5 CAL/FIBER) 1,000 mL (05/12/21 0546)   PRN Meds:.acetaminophen **OR** acetaminophen, naLOXone (NARCAN)  injection  Diet Orders (From admission, onward)     Start     Ordered   05/11/21 0035  Diet NPO time specified  Diet effective now        05/11/21 0035           DVT prophylaxis: enoxaparin (LOVENOX) injection 40 mg Start: 05/11/21 1000 SCDs Start: 05/11/21 0035    Code Status: Full Code  Family Communication: Called daughter later  Status is: Inpatient  Remains inpatient appropriate because: AMS, debility, hypernatremia, IV antibiotics  Level of care: Telemetry  Consultants:  None   Procedures:  None  Microbiology  none  Antimicrobials: Cefepime 10/25 >>   Objective: Vitals:   05/13/21 0332 05/13/21 0333 05/13/21 0500 05/13/21 0800  BP: 123/67 123/67  131/69  Pulse: (!) 51 (!) 47  61  Resp:      Temp:  (!) 97.4 F (36.3 C)  98.1 F (36.7 C)  TempSrc:  Oral  Oral  SpO2: 100% 100%  100%  Weight:   83 kg     Intake/Output Summary (Last 24 hours) at 05/13/2021 1136 Last data filed at 05/13/2021 0900 Gross per 24 hour  Intake 706.98 ml  Output 625 ml   Net 81.98 ml    Filed Weights   05/10/21 1900 05/13/21 0500  Weight: 86.3 kg 83 kg    Examination:  Constitutional: NAD Eyes: No scleral icterus ENMT: mmm Neck: normal, supple Respiratory: Clear, no wheezing, no crackles Cardiovascular: Regular rate and rhythm, no murmurs, no edema Abdomen: Soft, nontender, nondistended, bowel sounds positive Musculoskeletal: no clubbing / cyanosis.  Skin: No rashes appreciated Neurologic: No apparent focal deficits  Data Reviewed: I have independently reviewed following labs and imaging studies   CBC: Recent Labs  Lab 05/10/21 1219 05/11/21 0411 05/12/21 0127 05/13/21 0448  WBC 4.3 4.2 4.6 3.9*  NEUTROABS 2.2  --   --   --   HGB 9.9* 9.6* 10.1* 9.4*  HCT 31.4* 32.0* 32.5* 31.2*  MCV 96.0 98.2 96.4 97.8  PLT 210 200 187 154    Basic Metabolic Panel: Recent Labs  Lab 05/10/21 1219 05/11/21 0411 05/12/21 0127 05/13/21 0448  NA 150* 146* 149* 147*  K 4.9 4.7 4.9 4.6  CL 116* 111 115* 113*  CO2 27 27 27 27   GLUCOSE 93 62* 67* 138*  BUN 69* 63* 58* 50*  CREATININE 1.65* 1.63*  1.65* 1.62* 1.56*  CALCIUM 8.3* 8.7* 9.0 8.9  MG  --   --  3.0*  --     Liver Function Tests: Recent Labs  Lab 05/10/21 1219 05/11/21 0411 05/12/21 0127  AST 56* 44* 51*  ALT 123* 112* 106*  ALKPHOS 91 96 103  BILITOT 0.3 0.6 0.6  PROT 7.4 7.5 8.1  ALBUMIN 3.3* 3.3* 3.5    Coagulation Profile: No results for input(s): INR, PROTIME in the last 168 hours. HbA1C: No results for input(s): HGBA1C in the last 72 hours. CBG: Recent Labs  Lab 05/12/21 0059 05/12/21 0405 05/12/21 2311 05/13/21 0336 05/13/21 0809  GLUCAP 63* 98 133* 119* 137*     Recent Results (from the past 240 hour(s))  Resp Panel by RT-PCR (Flu A&B, Covid) Nasopharyngeal Swab     Status: None   Collection Time: 05/10/21  4:22 PM   Specimen: Nasopharyngeal Swab; Nasopharyngeal(NP) swabs in vial transport medium  Result Value Ref Range Status   SARS Coronavirus 2  by RT PCR NEGATIVE NEGATIVE Final    Comment: (NOTE) SARS-CoV-2 target nucleic acids are NOT DETECTED.  The SARS-CoV-2 RNA is generally detectable in upper respiratory specimens during the acute phase of infection. The lowest concentration of SARS-CoV-2 viral copies this assay can detect is 138 copies/mL. A negative result does not preclude SARS-Cov-2 infection and should not be used as the sole basis for treatment or other patient management decisions. A negative result may occur with  improper specimen collection/handling, submission of specimen other than nasopharyngeal swab, presence of viral mutation(s) within the areas targeted by this assay, and inadequate number of viral copies(<138 copies/mL). A negative result must be combined with clinical observations, patient history, and  epidemiological information. The expected result is Negative.  Fact Sheet for Patients:  BloggerCourse.com  Fact Sheet for Healthcare Providers:  SeriousBroker.it  This test is no t yet approved or cleared by the [place] FDA and  has been authorized for detection and/or diagnosis of SARS-CoV-2 by FDA under an Emergency Use Authorization (EUA). This EUA will remain  in effect (meaning this test can be used) for the duration of the COVID-19 declaration under Section 564(b)(1) of the Act, 21 U.S.C.section 360bbb-3(b)(1), unless the authorization is terminated  or revoked sooner.       Influenza A by PCR NEGATIVE NEGATIVE Final   Influenza B by PCR NEGATIVE NEGATIVE Final    Comment: (NOTE) The Xpert Xpress SARS-CoV-2/FLU/RSV plus assay is intended as an aid in the diagnosis of influenza from Nasopharyngeal swab specimens and should not be used as a sole basis for treatment. Nasal washings and aspirates are unacceptable for Xpert Xpress SARS-CoV-2/FLU/RSV testing.  Fact Sheet for Patients: BloggerCourse.com  Fact  Sheet for Healthcare Providers: SeriousBroker.it  This test is not yet approved or cleared by the [place] FDA and has been authorized for detection and/or diagnosis of SARS-CoV-2 by FDA under an Emergency Use Authorization (EUA). This EUA will remain in effect (meaning this test can be used) for the duration of the COVID-19 declaration under Section 564(b)(1) of the Act, 21 U.S.C. section 360bbb-3(b)(1), unless the authorization is terminated or revoked.  Performed at [facility], [address], [place], [place] [address]       Radiology Studies: No results found.  Pamella Pert, MD, PhD [facility]  Between 7 am - 7 pm I am available, please contact me via Amion (for emergencies) or Securechat (non urgent messages)  Between 7 pm - 7 am I am not available, please contact night coverage MD/APP via Amion

## 2021-05-14 DIAGNOSIS — J189 Pneumonia, unspecified organism: Secondary | ICD-10-CM | POA: Diagnosis not present

## 2021-05-14 DIAGNOSIS — E870 Hyperosmolality and hypernatremia: Secondary | ICD-10-CM | POA: Diagnosis not present

## 2021-05-14 LAB — GLUCOSE, CAPILLARY
Glucose-Capillary: 102 mg/dL — ABNORMAL HIGH (ref 70–99)
Glucose-Capillary: 115 mg/dL — ABNORMAL HIGH (ref 70–99)
Glucose-Capillary: 118 mg/dL — ABNORMAL HIGH (ref 70–99)
Glucose-Capillary: 119 mg/dL — ABNORMAL HIGH (ref 70–99)
Glucose-Capillary: 129 mg/dL — ABNORMAL HIGH (ref 70–99)
Glucose-Capillary: 80 mg/dL (ref 70–99)

## 2021-05-14 LAB — BASIC METABOLIC PANEL
Anion gap: 6 (ref 5–15)
BUN: 47 mg/dL — ABNORMAL HIGH (ref 8–23)
CO2: 25 mmol/L (ref 22–32)
Calcium: 8.4 mg/dL — ABNORMAL LOW (ref 8.9–10.3)
Chloride: 107 mmol/L (ref 98–111)
Creatinine, Ser: 1.3 mg/dL — ABNORMAL HIGH (ref 0.61–1.24)
GFR, Estimated: 57 mL/min — ABNORMAL LOW (ref >60)
Glucose, Bld: 106 mg/dL — ABNORMAL HIGH (ref 70–99)
Potassium: 4.7 mmol/L (ref 3.5–5.1)
Sodium: 138 mmol/L (ref 135–145)

## 2021-05-14 LAB — CBC
HCT: 27.7 % — ABNORMAL LOW (ref 39.0–52.0)
Hemoglobin: 8.8 g/dL — ABNORMAL LOW (ref 13.0–17.0)
MCH: 30.2 pg (ref 26.0–34.0)
MCHC: 31.8 g/dL (ref 30.0–36.0)
MCV: 95.2 fL (ref 80.0–100.0)
Platelets: 133 10*3/uL — ABNORMAL LOW (ref 150–400)
RBC: 2.91 MIL/uL — ABNORMAL LOW (ref 4.22–5.81)
RDW: 13.5 % (ref 11.5–15.5)
WBC: 3.1 10*3/uL — ABNORMAL LOW (ref 4.0–10.5)
nRBC: 0 % (ref 0.0–0.2)

## 2021-05-14 MED ORDER — TRAZODONE HCL 50 MG PO TABS
50.0000 mg | ORAL_TABLET | Freq: Once | ORAL | Status: DC
  Filled 2021-05-14: qty 1

## 2021-05-14 NOTE — Progress Notes (Signed)
PROGRESS NOTE  Jack Rivas DJS:[id number] DOB: December 25, 1945 DOA: 05/10/2021 PCP: Patient, No Pcp Per (Inactive)   LOS: 4 days   Brief Narrative / Interim history: Jack Rivas is a 75 y.o. male with medical history significant of dementia, dysphagia s/p feeding tube, hypertension, and recent hospital stay where he was treated for pneumonia, presenting with altered mental status, bradycardia, and shallow respirations. History is obtained from facility staff and daughter and chart.  Patient was apparently dyspneic, apneic, listless this morning.  He received narcan and fluids with EMS which improved his respiratory rate, interactivity, and his pupils enlarged per EDP notes. His daughter was told he was sent due to bradycardia, shallow respirations.  She notes he fell yesterday.  She's frustrated because they "keep him zonked".  She notes he hasn't walked in a long time.  Was using a wheelchair 4 months ago, but is basically bedbound now.    Subjective / 24h Interval events: Remains alert, no specific complaints.  He mumbles unintelligibly.  Assessment & Plan: Principal Problem Acute metabolic encephalopathy-etiology not entirely clear, possibly aspiration since he has chronic dysphagia and a PEG tube in place.  He has been placed on antibiotics, seems to be alert today.  Apparently responding to Narcan, however urine drug screen is negative.  Folate 17.9.  B12 1700.  TSH 5.6 in September, recheck currently at 4.3.  An EEG has been ordered and is negative for seizures.   -Discontinued Depakote after discussing with the daughter, she was concerned about him being overly sedated  Active Problems Aspiration pneumonia -he certainly is at risk, chest x-ray showed a small rounded opacity in the left upper lobe which was new.  Continue antibiotics, changed to Augmentin today  Goals of care-I was supposed to meet the daughter today at 59 AM but she never showed up.   CKD 3b - baseline Cr 1.5-1.7, currently  at baseline  Hypoglycemia -this has been noted during his hospital stay at [facility], thought to be due to poor p.o. intake.  He has a PEG tube in place.  Continue tube feeds  Acute left maxillary sinusitis -picked up on the CT scan,  on antibiotics as above  Fall -Found out of bed by daughter the day prior to admission. Head CT/neck CT without acute findings  Dysphagia -With peg, continue tube feeding.     Hypernatremia -change to D5W   Bradycardia -Monitor on telemetry, sinus rhythm    Dementia -Delirium precautions, continue Seroquel   Scheduled Meds:  amoxicillin-clavulanate  500 mg Per Tube Q8H   enoxaparin (LOVENOX) injection  40 mg Subcutaneous Q24H   feeding supplement (PROSource TF)  45 mL Per Tube BID   free water  200 mL Per Tube Q4H   polyethylene glycol  17 g Per Tube Daily   QUEtiapine  25 mg Per Tube QHS   Continuous Infusions:  dextrose 75 mL/hr at 05/14/21 1234   feeding supplement (JEVITY 1.5 CAL/FIBER) 1,000 mL (05/13/21 1639)   PRN Meds:.acetaminophen **OR** acetaminophen, naLOXone (NARCAN)  injection  Diet Orders (From admission, onward)     Start     Ordered   05/11/21 0035  Diet NPO time specified  Diet effective now        05/11/21 0035           DVT prophylaxis: enoxaparin (LOVENOX) injection 40 mg Start: 05/11/21 1000 SCDs Start: 05/11/21 0035    Code Status: Full Code  Family Communication: Called daughter later  Status is: Inpatient  Remains inpatient  appropriate because: AMS, debility, hypernatremia, IV antibiotics  Level of care: Telemetry  Consultants:  None   Procedures:  None   Microbiology  none  Antimicrobials: Cefepime 10/25 >>   Objective: Vitals:   05/13/21 0800 05/13/21 1605 05/14/21 0348 05/14/21 0442  BP: 131/69 125/85 138/80   Pulse: 61 (!) 57 71   Resp:  20 16   Temp: 98.1 F (36.7 C) 98 F (36.7 C)    TempSrc: Oral Oral    SpO2: 100% 98% 100%   Weight:    85.7 kg    Intake/Output Summary (Last  24 hours) at 05/14/2021 1325 Last data filed at 05/14/2021 9758 Gross per 24 hour  Intake --  Output 850 ml  Net -850 ml    Filed Weights   05/10/21 1900 05/13/21 0500 05/14/21 0442  Weight: 86.3 kg 83 kg 85.7 kg    Examination:  Constitutional: No distress, in bed Eyes: Anicteric ENMT: Moist mucous membranes Neck: normal, supple Respiratory: Clear bilaterally, no wheezing Cardiovascular: Regular rate and rhythm, no murmurs, no edema Abdomen: Soft, NT, ND, bowel sounds positive Musculoskeletal: no clubbing / cyanosis.  Skin: No rashes seen Neurologic: Nonfocal  Data Reviewed: I have independently reviewed following labs and imaging studies   CBC: Recent Labs  Lab 05/10/21 1219 05/11/21 0411 05/12/21 0127 05/13/21 0448 05/14/21 0522  WBC 4.3 4.2 4.6 3.9* 3.1*  NEUTROABS 2.2  --   --   --   --   HGB 9.9* 9.6* 10.1* 9.4* 8.8*  HCT 31.4* 32.0* 32.5* 31.2* 27.7*  MCV 96.0 98.2 96.4 97.8 95.2  PLT 210 200 187 154 133*    Basic Metabolic Panel: Recent Labs  Lab 05/10/21 1219 05/11/21 0411 05/12/21 0127 05/13/21 0448 05/14/21 0522  NA 150* 146* 149* 147* 138  K 4.9 4.7 4.9 4.6 4.7  CL 116* 111 115* 113* 107  CO2 27 27 27 27 25   GLUCOSE 93 62* 67* 138* 106*  BUN 69* 63* 58* 50* 47*  CREATININE 1.65* 1.63*  1.65* 1.62* 1.56* 1.30*  CALCIUM 8.3* 8.7* 9.0 8.9 8.4*  MG  --   --  3.0*  --   --     Liver Function Tests: Recent Labs  Lab 05/10/21 1219 05/11/21 0411 05/12/21 0127  AST 56* 44* 51*  ALT 123* 112* 106*  ALKPHOS 91 96 103  BILITOT 0.3 0.6 0.6  PROT 7.4 7.5 8.1  ALBUMIN 3.3* 3.3* 3.5    Coagulation Profile: No results for input(s): INR, PROTIME in the last 168 hours. HbA1C: No results for input(s): HGBA1C in the last 72 hours. CBG: Recent Labs  Lab 05/13/21 2124 05/14/21 0003 05/14/21 0346 05/14/21 0737 05/14/21 1148  GLUCAP 128* 102* 118* 119* 129*     Recent Results (from the past 240 hour(s))  Resp Panel by RT-PCR (Flu A&B,  Covid) Nasopharyngeal Swab     Status: None   Collection Time: 05/10/21  4:22 PM   Specimen: Nasopharyngeal Swab; Nasopharyngeal(NP) swabs in vial transport medium  Result Value Ref Range Status   SARS Coronavirus 2 by RT PCR NEGATIVE NEGATIVE Final    Comment: (NOTE) SARS-CoV-2 target nucleic acids are NOT DETECTED.  The SARS-CoV-2 RNA is generally detectable in upper respiratory specimens during the acute phase of infection. The lowest concentration of SARS-CoV-2 viral copies this assay can detect is 138 copies/mL. A negative result does not preclude SARS-Cov-2 infection and should not be used as the sole basis for treatment or other patient management decisions. A  negative result may occur with  improper specimen collection/handling, submission of specimen other than nasopharyngeal swab, presence of viral mutation(s) within the areas targeted by this assay, and inadequate number of viral copies(<138 copies/mL). A negative result must be combined with clinical observations, patient history, and epidemiological information. The expected result is Negative.  Fact Sheet for Patients:  BloggerCourse.com  Fact Sheet for Healthcare Providers:  SeriousBroker.it  This test is no t yet approved or cleared by the [place] FDA and  has been authorized for detection and/or diagnosis of SARS-CoV-2 by FDA under an Emergency Use Authorization (EUA). This EUA will remain  in effect (meaning this test can be used) for the duration of the COVID-19 declaration under Section 564(b)(1) of the Act, 21 U.S.C.section 360bbb-3(b)(1), unless the authorization is terminated  or revoked sooner.       Influenza A by PCR NEGATIVE NEGATIVE Final   Influenza B by PCR NEGATIVE NEGATIVE Final    Comment: (NOTE) The Xpert Xpress SARS-CoV-2/FLU/RSV plus assay is intended as an aid in the diagnosis of influenza from Nasopharyngeal swab specimens and should  not be used as a sole basis for treatment. Nasal washings and aspirates are unacceptable for Xpert Xpress SARS-CoV-2/FLU/RSV testing.  Fact Sheet for Patients: BloggerCourse.com  Fact Sheet for Healthcare Providers: SeriousBroker.it  This test is not yet approved or cleared by the [place] FDA and has been authorized for detection and/or diagnosis of SARS-CoV-2 by FDA under an Emergency Use Authorization (EUA). This EUA will remain in effect (meaning this test can be used) for the duration of the COVID-19 declaration under Section 564(b)(1) of the Act, 21 U.S.C. section 360bbb-3(b)(1), unless the authorization is terminated or revoked.  Performed at [facility], [address] [place], [place] [address]       Radiology Studies: No results found.  Pamella Pert, MD, PhD [facility]  Between 7 am - 7 pm I am available, please contact me via Amion (for emergencies) or Securechat (non urgent messages)  Between 7 pm - 7 am I am not available, please contact night coverage MD/APP via Amion

## 2021-05-15 DIAGNOSIS — J189 Pneumonia, unspecified organism: Secondary | ICD-10-CM | POA: Diagnosis not present

## 2021-05-15 DIAGNOSIS — E870 Hyperosmolality and hypernatremia: Secondary | ICD-10-CM | POA: Diagnosis not present

## 2021-05-15 LAB — BASIC METABOLIC PANEL
Anion gap: 7 (ref 5–15)
BUN: 41 mg/dL — ABNORMAL HIGH (ref 8–23)
CO2: 25 mmol/L (ref 22–32)
Calcium: 8.5 mg/dL — ABNORMAL LOW (ref 8.9–10.3)
Chloride: 104 mmol/L (ref 98–111)
Creatinine, Ser: 1.2 mg/dL (ref 0.61–1.24)
GFR, Estimated: >60 mL/min (ref >60)
Glucose, Bld: 114 mg/dL — ABNORMAL HIGH (ref 70–99)
Potassium: 5 mmol/L (ref 3.5–5.1)
Sodium: 136 mmol/L (ref 135–145)

## 2021-05-15 LAB — GLUCOSE, CAPILLARY
Glucose-Capillary: 113 mg/dL — ABNORMAL HIGH (ref 70–99)
Glucose-Capillary: 122 mg/dL — ABNORMAL HIGH (ref 70–99)
Glucose-Capillary: 124 mg/dL — ABNORMAL HIGH (ref 70–99)
Glucose-Capillary: 91 mg/dL (ref 70–99)
Glucose-Capillary: 94 mg/dL (ref 70–99)
Glucose-Capillary: 99 mg/dL (ref 70–99)

## 2021-05-15 NOTE — Progress Notes (Signed)
PROGRESS NOTE  Jack Rivas ONG:[id number] DOB: [birth year] DOA: 05/10/2021 PCP: Patient, No Pcp Per (Inactive)   LOS: 5 days   Brief Narrative / Interim history: Jack Rivas is a 75 y.o. male with medical history significant of dementia, dysphagia s/p feeding tube, hypertension, and recent hospital stay where he was treated for pneumonia, presenting with altered mental status, bradycardia, and shallow respirations. History is obtained from facility staff and daughter and chart.  Patient was apparently dyspneic, apneic, listless this morning.  He received narcan and fluids with EMS which improved his respiratory rate, interactivity, and his pupils enlarged per EDP notes. His daughter was told he was sent due to bradycardia, shallow respirations.  She notes he fell yesterday.  She's frustrated because they "keep him zonked".  She notes he hasn't walked in a long time.  Was using a wheelchair 4 months ago, but is basically bedbound now.    Subjective / 24h Interval events: Alert, says that he is doing "fine".  Assessment & Plan: Principal Problem Acute metabolic encephalopathy-etiology not entirely clear, possibly aspiration since he has chronic dysphagia and a PEG tube in place.  He has been placed on antibiotics, seems to be alert today.  Apparently responding to Narcan, however urine drug screen is negative.  Folate 17.9.  B12 1700.  TSH 5.6 in September, recheck currently at 4.3.  An EEG has been ordered and is negative for seizures.   -Discontinued Depakote after discussing with the daughter, she was concerned about him being overly sedated. -This seems to have resolved, he appears back to baseline  Active Problems Aspiration pneumonia -he certainly is at risk, chest x-ray showed a small rounded opacity in the left upper lobe which was new.  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 intake.  He has a PEG tube in place.  Continue tube feeds  Acute left maxillary sinusitis -picked up on the CT scan,  on antibiotics as above  Fall -Found out of bed by daughter the day prior to admission. Head CT/neck CT without acute findings  Dysphagia -With peg, continue tube feeding.     Hypernatremia -change to D5W   Bradycardia -Monitor on telemetry, sinus rhythm    Dementia -Delirium precautions, continue Seroquel   Scheduled Meds:  amoxicillin-clavulanate  500 mg Per Tube Q8H   enoxaparin (LOVENOX) injection  40 mg Subcutaneous Q24H   feeding supplement (PROSource TF)  45 mL Per Tube BID   free water  200 mL Per Tube Q4H   polyethylene glycol  17 g Per Tube Daily   QUEtiapine  25 mg Per Tube QHS   traZODone  50 mg Oral Once   Continuous Infusions:  feeding supplement (JEVITY 1.5 CAL/FIBER) 1,000 mL (05/15/21 0313)   PRN Meds:.acetaminophen **OR** acetaminophen, naLOXone (NARCAN)  injection  Diet Orders (From admission, onward)     Start     Ordered   05/11/21 0035  Diet NPO time specified  Diet effective now        05/11/21 0035           DVT prophylaxis: enoxaparin (LOVENOX) injection 40 mg Start: 05/11/21 1000 SCDs Start: 05/11/21 0035    Code Status: Full Code  Family Communication: Called daughter later  Status is: Inpatient  Remains inpatient appropriate because: AMS, debility, hypernatremia, IV antibiotics  Level of  care: Telemetry  Consultants:  None   Procedures:  None   Microbiology  none  Antimicrobials: Cefepime 10/25 >>   Objective: Vitals:   05/14/21 2104 05/15/21 0500 05/15/21 0553 05/15/21 0719  BP: (!) 125/50  121/72 96/71  Pulse: 90  (!) 50 (!) 52  Resp: 12  12 15   Temp:    98.6 F (37 C)  TempSrc:      SpO2: 100%  100% 100%  Weight:  85.7 kg      Intake/Output Summary (Last 24 hours) at 05/15/2021 1126 Last data filed at 05/15/2021 1012 Gross per 24 hour  Intake  --  Output 2750 ml  Net -2750 ml    Filed Weights   05/13/21 0500 05/14/21 0442 05/15/21 0500  Weight: 83 kg 85.7 kg 85.7 kg    Examination:  Constitutional: NAD, in bed Eyes: Anicteric ENMT: Moist extremities Neck: normal, supple Respiratory: Clear to auscultation bilaterally, no wheezing Cardiovascular: Regular rate and rhythm, no murmurs, no peripheral edema Abdomen: Soft, nontender, nondistended, bowel sounds positive Musculoskeletal: no clubbing / cyanosis.  Skin: No rashes seen Neurologic: No focal deficits  Data Reviewed: I have independently reviewed following labs and imaging studies   CBC: Recent Labs  Lab 05/10/21 1219 05/11/21 0411 05/12/21 0127 05/13/21 0448 05/14/21 0522  WBC 4.3 4.2 4.6 3.9* 3.1*  NEUTROABS 2.2  --   --   --   --   HGB 9.9* 9.6* 10.1* 9.4* 8.8*  HCT 31.4* 32.0* 32.5* 31.2* 27.7*  MCV 96.0 98.2 96.4 97.8 95.2  PLT 210 200 187 154 133*    Basic Metabolic Panel: Recent Labs  Lab 05/11/21 0411 05/12/21 0127 05/13/21 0448 05/14/21 0522 05/15/21 0851  NA 146* 149* 147* 138 136  K 4.7 4.9 4.6 4.7 5.0  CL 111 115* 113* 107 104  CO2 27 27 27 25 25   GLUCOSE 62* 67* 138* 106* 114*  BUN 63* 58* 50* 47* 41*  CREATININE 1.63*  1.65* 1.62* 1.56* 1.30* 1.20  CALCIUM 8.7* 9.0 8.9 8.4* 8.5*  MG  --  3.0*  --   --   --     Liver Function Tests: Recent Labs  Lab 05/10/21 1219 05/11/21 0411 05/12/21 0127  AST 56* 44* 51*  ALT 123* 112* 106*  ALKPHOS 91 96 103  BILITOT 0.3 0.6 0.6  PROT 7.4 7.5 8.1  ALBUMIN 3.3* 3.3* 3.5    Coagulation Profile: No results for input(s): INR, PROTIME in the last 168 hours. HbA1C: No results for input(s): HGBA1C in the last 72 hours. CBG: Recent Labs  Lab 05/14/21 1608 05/14/21 2107 05/15/21 0037 05/15/21 0411 05/15/21 0740  GLUCAP 115* 80 124* 91 122*     Recent Results (from the past 240 hour(s))  Resp Panel by RT-PCR (Flu A&B, Covid) Nasopharyngeal Swab     Status: None    Collection Time: 05/10/21  4:22 PM   Specimen: Nasopharyngeal Swab; Nasopharyngeal(NP) swabs in vial transport medium  Result Value Ref Range Status   SARS Coronavirus 2 by RT PCR NEGATIVE NEGATIVE Final    Comment: (NOTE) SARS-CoV-2 target nucleic acids are NOT DETECTED.  The SARS-CoV-2 RNA is generally detectable in upper respiratory specimens during the acute phase of infection. The lowest concentration of SARS-CoV-2 viral copies this assay can detect is 138 copies/mL. A negative result does not preclude SARS-Cov-2 infection and should not be used as the sole basis for treatment or other patient management decisions. A negative result may occur with  improper specimen  collection/handling, submission of specimen other than nasopharyngeal swab, presence of viral mutation(s) within the areas targeted by this assay, and inadequate number of viral copies(<138 copies/mL). A negative result must be combined with clinical observations, patient history, and epidemiological information. The expected result is Negative.  Fact Sheet for Patients:  BloggerCourse.com  Fact Sheet for Healthcare Providers:  SeriousBroker.it  This test is no t yet approved or cleared by the [place] FDA and  has been authorized for detection and/or diagnosis of SARS-CoV-2 by FDA under an Emergency Use Authorization (EUA). This EUA will remain  in effect (meaning this test can be used) for the duration of the COVID-19 declaration under Section 564(b)(1) of the Act, 21 U.S.C.section 360bbb-3(b)(1), unless the authorization is terminated  or revoked sooner.       Influenza A by PCR NEGATIVE NEGATIVE Final   Influenza B by PCR NEGATIVE NEGATIVE Final    Comment: (NOTE) The Xpert Xpress SARS-CoV-2/FLU/RSV plus assay is intended as an aid in the diagnosis of influenza from Nasopharyngeal swab specimens and should not be used as a sole basis for treatment.  Nasal washings and aspirates are unacceptable for Xpert Xpress SARS-CoV-2/FLU/RSV testing.  Fact Sheet for Patients: BloggerCourse.com  Fact Sheet for Healthcare Providers: SeriousBroker.it  This test is not yet approved or cleared by the [place] FDA and has been authorized for detection and/or diagnosis of SARS-CoV-2 by FDA under an Emergency Use Authorization (EUA). This EUA will remain in effect (meaning this test can be used) for the duration of the COVID-19 declaration under Section 564(b)(1) of the Act, 21 U.S.C. section 360bbb-3(b)(1), unless the authorization is terminated or revoked.  Performed at [facility], [address], [place], [place] [address]       Radiology Studies: No results found.  Pamella Pert, MD, PhD [facility]  Between 7 am - 7 pm I am available, please contact me via Amion (for emergencies) or Securechat (non urgent messages)  Between 7 pm - 7 am I am not available, please contact night coverage MD/APP via Amion

## 2021-05-16 ENCOUNTER — Inpatient Hospital Stay (HOSPITAL_COMMUNITY): Payer: Medicare Other

## 2021-05-16 DIAGNOSIS — E870 Hyperosmolality and hypernatremia: Secondary | ICD-10-CM | POA: Diagnosis not present

## 2021-05-16 DIAGNOSIS — R9431 Abnormal electrocardiogram [ECG] [EKG]: Secondary | ICD-10-CM

## 2021-05-16 DIAGNOSIS — J189 Pneumonia, unspecified organism: Secondary | ICD-10-CM | POA: Diagnosis not present

## 2021-05-16 LAB — GLUCOSE, CAPILLARY
Glucose-Capillary: 103 mg/dL — ABNORMAL HIGH (ref 70–99)
Glucose-Capillary: 107 mg/dL — ABNORMAL HIGH (ref 70–99)
Glucose-Capillary: 82 mg/dL (ref 70–99)
Glucose-Capillary: 82 mg/dL (ref 70–99)
Glucose-Capillary: 85 mg/dL (ref 70–99)
Glucose-Capillary: 85 mg/dL (ref 70–99)
Glucose-Capillary: 88 mg/dL (ref 70–99)

## 2021-05-16 LAB — CBC
HCT: 26.1 % — ABNORMAL LOW (ref 39.0–52.0)
Hemoglobin: 8.4 g/dL — ABNORMAL LOW (ref 13.0–17.0)
MCH: 29.5 pg (ref 26.0–34.0)
MCHC: 32.2 g/dL (ref 30.0–36.0)
MCV: 91.6 fL (ref 80.0–100.0)
Platelets: 117 10*3/uL — ABNORMAL LOW (ref 150–400)
RBC: 2.85 MIL/uL — ABNORMAL LOW (ref 4.22–5.81)
RDW: 13.2 % (ref 11.5–15.5)
WBC: 2.9 10*3/uL — ABNORMAL LOW (ref 4.0–10.5)
nRBC: 0 % (ref 0.0–0.2)

## 2021-05-16 LAB — ECHOCARDIOGRAM COMPLETE
Area-P 1/2: 1.92 cm2
Calc EF: 40.2 %
S' Lateral: 3.8 cm
Single Plane A2C EF: 49 %
Single Plane A4C EF: 36.2 %
Weight: 3068.8 oz

## 2021-05-16 LAB — BASIC METABOLIC PANEL
Anion gap: 6 (ref 5–15)
BUN: 44 mg/dL — ABNORMAL HIGH (ref 8–23)
CO2: 28 mmol/L (ref 22–32)
Calcium: 8.6 mg/dL — ABNORMAL LOW (ref 8.9–10.3)
Chloride: 103 mmol/L (ref 98–111)
Creatinine, Ser: 1.1 mg/dL (ref 0.61–1.24)
GFR, Estimated: >60 mL/min (ref >60)
Glucose, Bld: 119 mg/dL — ABNORMAL HIGH (ref 70–99)
Potassium: 4.4 mmol/L (ref 3.5–5.1)
Sodium: 137 mmol/L (ref 135–145)

## 2021-05-16 LAB — MAGNESIUM: Magnesium: 2 mg/dL (ref 1.7–2.4)

## 2021-05-16 NOTE — Progress Notes (Signed)
Notified by RN that pt had 18 beat run of wide QRS.  Pt is hemodynamically stable. No complaints at this time Check electrolytes now that are ordered for this am.

## 2021-05-16 NOTE — Progress Notes (Signed)
  Echocardiogram 2D Echocardiogram has been performed.  Jack Rivas 05/16/2021, 12:41 PM

## 2021-05-16 NOTE — Progress Notes (Signed)
PROGRESS NOTE  Brittani Peniston HCW:[id number] DOB: 1946/03/25 DOA: 05/10/2021 PCP: Patient, No Pcp Per (Inactive)   LOS: 6 days   Brief Narrative / Interim history: Jack Rivas is a 75 y.o. male with medical history significant of dementia, dysphagia s/p feeding tube, hypertension, and recent hospital stay where he was treated for pneumonia, presenting with altered mental status, bradycardia, and shallow respirations. History is obtained from facility staff and daughter and chart.  Patient was apparently dyspneic, apneic, listless this morning.  He received narcan and fluids with EMS which improved his respiratory rate, interactivity, and his pupils enlarged per EDP notes. His daughter was told he was sent due to bradycardia, shallow respirations.  She notes he fell yesterday.  She's frustrated because they "keep him zonked".  She notes he hasn't walked in a long time.  Was using a wheelchair 4 months ago, but is basically bedbound now.    Subjective / 24h Interval events: Alert, confused, no specific complaints  Assessment & Plan: Principal Problem Acute metabolic encephalopathy-etiology not entirely clear but looks like somewhat chronic, possibly aspiration played a role since he has chronic dysphagia and a PEG tube in place.  He has been placed on antibiotics, seems to be alert today.  Apparently responding to Narcan, however urine drug screen is negative.  Folate 17.9.  B12 1700.  TSH 5.6 in September, recheck currently at 4.3.  An EEG has been ordered and is negative for seizures.   -Discontinued Depakote after discussing with the daughter, she was concerned about him being overly sedated. -This seems to have resolved, he appears back to baseline  Active Problems Aspiration pneumonia -he certainly is at risk, chest x-ray showed a small rounded opacity in the left upper lobe which was new.  Continue Augmentin, plan for total of 7 days  18 beat run of wide QRS-electrolytes look acceptable,  potassium 4.4, magnesium 2.0.  Obtain a 2D echo and continue to monitor  CKD 3b - baseline Cr 1.5-1.7, creatinine currently 1.1  Hypoglycemia -this has been noted during his hospital stay at [facility], thought to be due to poor p.o. intake.  He has a PEG tube in place.  Continue tube feeds  Acute left maxillary sinusitis -picked up on the CT scan,  on antibiotics as above  Fall -Found out of bed by daughter the day prior to admission. Head CT/neck CT without acute findings  Dysphagia -With peg, continue tube feeding.     Hypernatremia -change to D5W   Bradycardia -Monitor on telemetry, sinus rhythm    Dementia -Delirium precautions, continue Seroquel   Scheduled Meds:  amoxicillin-clavulanate  500 mg Per Tube Q8H   enoxaparin (LOVENOX) injection  40 mg Subcutaneous Q24H   feeding supplement (PROSource TF)  45 mL Per Tube BID   free water  200 mL Per Tube Q4H   polyethylene glycol  17 g Per Tube Daily   QUEtiapine  25 mg Per Tube QHS   traZODone  50 mg Oral Once   Continuous Infusions:  feeding supplement (JEVITY 1.5 CAL/FIBER) 1,000 mL (05/15/21 2228)   PRN Meds:.acetaminophen **OR** acetaminophen, naLOXone (NARCAN)  injection  Diet Orders (From admission, onward)     Start     Ordered   05/11/21 0035  Diet NPO time specified  Diet effective now        05/11/21 0035           DVT prophylaxis: enoxaparin (LOVENOX) injection 40 mg Start: 05/11/21 1000 SCDs Start: 05/11/21 0035  Code Status: Full Code  Family Communication: No family at bedside  Status is: Inpatient  Remains inpatient appropriate because: AMS, debility, hypernatremia, IV antibiotics  Level of care: Telemetry  Consultants:  None   Procedures:  None   Microbiology  none  Antimicrobials: Cefepime 10/25 >>   Objective: Vitals:   05/15/21 1352 05/15/21 2044 05/16/21 0344 05/16/21 0353  BP: 101/75 122/78  120/70  Pulse: (!) 55 62  (!) 49  Resp: 20 14  16   Temp: 98.2 F (36.8 C) 97.8  F (36.6 C)    TempSrc:  Oral    SpO2: 91% 100%  100%  Weight:   87 kg     Intake/Output Summary (Last 24 hours) at 05/16/2021 1021 Last data filed at 05/16/2021 0534 Gross per 24 hour  Intake --  Output 1400 ml  Net -1400 ml    Filed Weights   05/14/21 0442 05/15/21 0500 05/16/21 0344  Weight: 85.7 kg 85.7 kg 87 kg    Examination:  Constitutional: No distress Eyes: No scleral icterus ENMT: Moist mucous membranes Neck: normal, supple Respiratory: Clear bilaterally, no wheezing Cardiovascular: Regular rate and rhythm, no murmurs Abdomen: Soft, NT, ND, bowel sounds positive Musculoskeletal: no clubbing / cyanosis.  Skin: No rashes Neurologic: No focal deficits  Data Reviewed: I have independently reviewed following labs and imaging studies   CBC: Recent Labs  Lab 05/10/21 1219 05/11/21 0411 05/12/21 0127 05/13/21 0448 05/14/21 0522 05/16/21 0515  WBC 4.3 4.2 4.6 3.9* 3.1* 2.9*  NEUTROABS 2.2  --   --   --   --   --   HGB 9.9* 9.6* 10.1* 9.4* 8.8* 8.4*  HCT 31.4* 32.0* 32.5* 31.2* 27.7* 26.1*  MCV 96.0 98.2 96.4 97.8 95.2 91.6  PLT 210 200 187 154 133* 117*    Basic Metabolic Panel: Recent Labs  Lab 05/12/21 0127 05/13/21 0448 05/14/21 0522 05/15/21 0851 05/16/21 0515  NA 149* 147* 138 136 137  K 4.9 4.6 4.7 5.0 4.4  CL 115* 113* 107 104 103  CO2 27 27 25 25 28   GLUCOSE 67* 138* 106* 114* 119*  BUN 58* 50* 47* 41* 44*  CREATININE 1.62* 1.56* 1.30* 1.20 1.10  CALCIUM 9.0 8.9 8.4* 8.5* 8.6*  MG 3.0*  --   --   --  2.0    Liver Function Tests: Recent Labs  Lab 05/10/21 1219 05/11/21 0411 05/12/21 0127  AST 56* 44* 51*  ALT 123* 112* 106*  ALKPHOS 91 96 103  BILITOT 0.3 0.6 0.6  PROT 7.4 7.5 8.1  ALBUMIN 3.3* 3.3* 3.5    Coagulation Profile: No results for input(s): INR, PROTIME in the last 168 hours. HbA1C: No results for input(s): HGBA1C in the last 72 hours. CBG: Recent Labs  Lab 05/15/21 1605 05/15/21 2130 05/16/21 0009  05/16/21 0348 05/16/21 0824  GLUCAP 94 113* 88 82 107*     Recent Results (from the past 240 hour(s))  Resp Panel by RT-PCR (Flu A&B, Covid) Nasopharyngeal Swab     Status: None   Collection Time: 05/10/21  4:22 PM   Specimen: Nasopharyngeal Swab; Nasopharyngeal(NP) swabs in vial transport medium  Result Value Ref Range Status   SARS Coronavirus 2 by RT PCR NEGATIVE NEGATIVE Final    Comment: (NOTE) SARS-CoV-2 target nucleic acids are NOT DETECTED.  The SARS-CoV-2 RNA is generally detectable in upper respiratory specimens during the acute phase of infection. The lowest concentration of SARS-CoV-2 viral copies this assay can detect is 138 copies/mL. A  negative result does not preclude SARS-Cov-2 infection and should not be used as the sole basis for treatment or other patient management decisions. A negative result may occur with  improper specimen collection/handling, submission of specimen other than nasopharyngeal swab, presence of viral mutation(s) within the areas targeted by this assay, and inadequate number of viral copies(<138 copies/mL). A negative result must be combined with clinical observations, patient history, and epidemiological information. The expected result is Negative.  Fact Sheet for Patients:  EntrepreneurPulse.com.au  Fact Sheet for Healthcare Providers:  IncredibleEmployment.be  This test is no t yet approved or cleared by the [place] FDA and  has been authorized for detection and/or diagnosis of SARS-CoV-2 by FDA under an Emergency Use Authorization (EUA). This EUA will remain  in effect (meaning this test can be used) for the duration of the COVID-19 declaration under Section 564(b)(1) of the Act, 21 U.S.C.section 360bbb-3(b)(1), unless the authorization is terminated  or revoked sooner.       Influenza A by PCR NEGATIVE NEGATIVE Final   Influenza B by PCR NEGATIVE NEGATIVE Final    Comment: (NOTE) The  Xpert Xpress SARS-CoV-2/FLU/RSV plus assay is intended as an aid in the diagnosis of influenza from Nasopharyngeal swab specimens and should not be used as a sole basis for treatment. Nasal washings and aspirates are unacceptable for Xpert Xpress SARS-CoV-2/FLU/RSV testing.  Fact Sheet for Patients: EntrepreneurPulse.com.au  Fact Sheet for Healthcare Providers: IncredibleEmployment.be  This test is not yet approved or cleared by the [place] FDA and has been authorized for detection and/or diagnosis of SARS-CoV-2 by FDA under an Emergency Use Authorization (EUA). This EUA will remain in effect (meaning this test can be used) for the duration of the COVID-19 declaration under Section 564(b)(1) of the Act, 21 U.S.C. section 360bbb-3(b)(1), unless the authorization is terminated or revoked.  Performed at [facility], [place] [address], [place], [place] [address]       Radiology Studies: No results found.  Marzetta Board, MD, PhD [facility]  Between 7 am - 7 pm I am available, please contact me via Amion (for emergencies) or Securechat (non urgent messages)  Between 7 pm - 7 am I am not available, please contact night coverage MD/APP via Amion

## 2021-05-16 NOTE — TOC Initial Note (Addendum)
Transition of Care [facility]) - Initial/Assessment Note    Patient Details  Name: Jack Rivas MRN: [id number] Date of Birth: 1946-01-02  Transition of [facility]) CM/SW Contact:    Ida Rogue, LCSW Phone Number: 05/16/2021, 11:48 AM  Clinical Narrative:   CSW responding to MD request to reach out to daughter as   "I believe and daughters are very very adamant that he will not return there so we need to find a new place for him."  Attempted to speak to daughter at number in chart X13.  No answer and unable to leave a message.  Will try again later.  TOC will continue to follow during the course of hospitalization.  Addendum:  Spoke with daughter, who raised concerns about current facility.  "He is not getting the care nor the therapy that he needs."  I asked about MCD, she has applied but he has not been approved.  I listened to her concerns, told her that my impression from MD is that he is long term care and he will likely not get other bed offers as he has no MCD and has used up many of his rehab days. She acknowledged these things to be true, and our conversation then shifted to specific complaints about the current facility.  I validated her concerns, and let her know I would reach out to [facility], our liaison at the facility, and ask her to get back to her to address her concerns.  Done.  I will circle back to the daughter tomorrow to find out where things stand. TOC will continue to follow during the course of hospitalization.                Expected Discharge Plan: Skilled Nursing Facility Barriers to Discharge: SNF pending d/c order   Patient Goals and CMS Choice        Expected Discharge Plan and Services Expected Discharge Plan: Skilled Nursing Facility                                              Prior Living Arrangements/Services                       Activities of Daily Living Home Assistive Devices/Equipment: Other (Comment) (tube feeding) ADL  Screening (condition at time of admission) Patient's cognitive ability adequate to safely complete daily activities?: No Is the patient deaf or have difficulty hearing?: No Does the patient have difficulty seeing, even when wearing glasses/contacts?: No Does the patient have difficulty concentrating, remembering, or making decisions?: Yes Patient able to express need for assistance with ADLs?: No Does the patient have difficulty dressing or bathing?: Yes Independently performs ADLs?: No Communication: Needs assistance Is this a change from baseline?: Pre-admission baseline Dressing (OT): Needs assistance Is this a change from baseline?: Pre-admission baseline Grooming: Needs assistance Is this a change from baseline?: Pre-admission baseline Feeding: Needs assistance Is this a change from baseline?: Pre-admission baseline Bathing: Needs assistance Is this a change from baseline?: Pre-admission baseline Toileting: Needs assistance Is this a change from baseline?: Pre-admission baseline In/Out Bed: Needs assistance Is this a change from baseline?: Pre-admission baseline Walks in Home: Needs assistance Is this a change from baseline?: Pre-admission baseline Does the patient have difficulty walking or climbing stairs?: Yes Weakness of Legs: Both Weakness of Arms/Hands: Both  Permission Sought/Granted  Emotional Assessment              Admission diagnosis:  Hypernatremia [E87.0] Pneumonia [J18.9] SOB (shortness of breath) [R06.02] HCAP (healthcare-associated pneumonia) [id number] Acute metabolic encephalopathy [id number] AMS (altered mental status) [R41.82] Patient Active Problem List   Diagnosis Date Noted   Pneumonia 05/10/2021   AMS (altered mental status) 05/10/2021   HCAP (healthcare-associated pneumonia) 04/20/2021   CKD (chronic kidney disease), stage III ([place]) 04/20/2021   Bradycardia 04/20/2021   Severe sepsis ([place]) 04/20/2021   Malnutrition of  moderate degree [id number]   Acute metabolic encephalopathy A999333   Aspiration pneumonia ([place]) 03/20/2021   Dementia ([place]) 03/20/2021   Dysphagia 03/20/2021   PCP:  Patient, No Pcp Per (Inactive) Pharmacy:   [id number] [place], [place] [id number] [place] [id number] Phone: [phone] Fax: [phone]     Social Determinants of Health (SDOH) Interventions    Readmission Risk Interventions Readmission Risk Prevention Plan 03/28/2021  Transportation Screening Complete  PCP or Specialist Appt within 3-5 Days Complete  HRI or [place] Complete  Social Work Consult for [place] Planning/Counseling Complete  Palliative Care Screening Not Applicable  Medication Review Press photographer) Complete

## 2021-05-16 NOTE — Progress Notes (Signed)
On-call MD notified of 18 beat wide QRS run.

## 2021-05-17 LAB — BASIC METABOLIC PANEL WITH GFR
Anion gap: 8 (ref 5–15)
BUN: 47 mg/dL — ABNORMAL HIGH (ref 8–23)
CO2: 27 mmol/L (ref 22–32)
Calcium: 8.8 mg/dL — ABNORMAL LOW (ref 8.9–10.3)
Chloride: 103 mmol/L (ref 98–111)
Creatinine, Ser: 1.21 mg/dL (ref 0.61–1.24)
GFR, Estimated: >60 mL/min
Glucose, Bld: 84 mg/dL (ref 70–99)
Potassium: 5.2 mmol/L — ABNORMAL HIGH (ref 3.5–5.1)
Sodium: 138 mmol/L (ref 135–145)

## 2021-05-17 LAB — GLUCOSE, CAPILLARY
Glucose-Capillary: 93 mg/dL (ref 70–99)
Glucose-Capillary: 84 mg/dL (ref 70–99)
Glucose-Capillary: 82 mg/dL (ref 70–99)
Glucose-Capillary: 88 mg/dL (ref 70–99)
Glucose-Capillary: 106 mg/dL — ABNORMAL HIGH (ref 70–99)
Glucose-Capillary: 98 mg/dL (ref 70–99)

## 2021-05-17 LAB — MAGNESIUM: Magnesium: 2.1 mg/dL (ref 1.7–2.4)

## 2021-05-17 MED ORDER — SODIUM ZIRCONIUM CYCLOSILICATE 5 G PO PACK
5.0000 g | PACK | Freq: Once | ORAL | Status: AC
  Administered 2021-05-17: 5 g
  Filled 2021-05-17: qty 1

## 2021-05-17 NOTE — Progress Notes (Signed)
PROGRESS NOTE  Jack Rivas:[id number] DOB: [birth year] DOA: 05/10/2021 PCP: Patient, No Pcp Per (Inactive)   LOS: 7 days   Brief Narrative / Interim history: Jack Rivas is a 75 y.o. male with medical history significant of dementia, dysphagia s/p feeding tube, hypertension, and recent hospital stay where he was treated for pneumonia, presenting with altered mental status, bradycardia, and shallow respirations. History is obtained from facility staff and daughter and chart.  Patient was apparently dyspneic, apneic, listless this morning.  He received narcan and fluids with EMS which improved his respiratory rate, interactivity, and his pupils enlarged per EDP notes. His daughter was told he was sent due to bradycardia, shallow respirations.  She notes he fell yesterday.  She's frustrated because they "keep him zonked".  She notes he hasn't walked in a long time.  Was using a wheelchair 4 months ago, but is basically bedbound now.    Subjective / 24h Interval events: Alert, remains confused, no complaints.  Very hard of hearing  Assessment & Plan: Principal Problem Acute metabolic encephalopathy, lethargy-etiology not entirely clear but looks like somewhat chronic, possibly aspiration played a role since he has chronic dysphagia and a PEG tube in place.  When he was lethargic initially, was given Narcan and apparently responded to that however UDS was negative. Folate 17.9.  B12 1700.  TSH 5.6 in September, recheck currently at 4.3.  An EEG has been ordered and is negative for seizures.   -Daughter was concerned that his lethargy is from Depakote that has been added at his most recent SNF, and this was discontinued. -His mental status appears to be back to baseline, he is alert but remains confused  Active Problems Aspiration pneumonia -he certainly is at risk, chest x-ray showed a small rounded opacity in the left upper lobe which was new.  He is on Augmentin, today completed a total of 7 days  of antibiotics, stop Augmentin  18 beat run of wide QRS-electrolytes look acceptable, potassium 4.4, magnesium 2.0.  2D echo shows mildly decreased EF at 40-45%, no WMA, grade 1 diastolic dysfunction.  Suspect chronic, he appears euvolemic and no cardiac complaints  CKD 3b - baseline Cr 1.5-1.7, creatinine currently better than baseline  Hypoglycemia -this has been noted during his hospital stay at [facility], thought to be due to poor p.o. intake.  He has a PEG tube in place.  Continue tube feeds  Acute left maxillary sinusitis -picked up on the CT scan,  on antibiotics as above  Fall -Found out of bed by daughter the day prior to admission. Head CT/neck CT without acute findings  Dysphagia -With peg, continue tube feeding.     Hypernatremia -resolved with dextrose water, now continue tube feeds without any additional IV fluids and monitor sodium level.  Stable   Bradycardia -Monitor on telemetry, sinus rhythm    Dementia -Delirium precautions, continue Seroquel  Goals of care-discussed at length with the patient's 2 daughters.  He had a fairly good quality of life, living independently, doing all his ADLs, up until earlier this year when he got diagnosed with COVID, was hospitalized and had a prolonged hospitalization.  He became weaker, delirious at times, persistently confused, and developed dysphagia.  He had a tracheostomy tube in place but has been removed and currently he is on room air.  Still has a PEG tube.  Family intermittently has been feeding him but I strongly advised him not to do so since he is at risk for ongoing aspiration as  well as silent aspiration.  They do understand that, but feels like [facility] where he was before was not doing a good job in actually giving him nutrition and water via PEG tube and that is why he was hypernatremic when he was hospitalized here.  They want him in a nursing home to try to give him the best chance at rehab but eventually they  are working to get him home and care for him at home. Daughter [name] is in process of obtaining new housing which will happen in about couple of months and at that time she plans to care for him at her home.   -Given multiple palliative care discussions in the past I know daughter's wishes and did not address code status  Scheduled Meds:  amoxicillin-clavulanate  500 mg Per Tube Q8H   enoxaparin (LOVENOX) injection  40 mg Subcutaneous Q24H   feeding supplement (PROSource TF)  45 mL Per Tube BID   free water  200 mL Per Tube Q4H   polyethylene glycol  17 g Per Tube Daily   QUEtiapine  25 mg Per Tube QHS   traZODone  50 mg Oral Once   Continuous Infusions:  feeding supplement (JEVITY 1.5 CAL/FIBER) 1,000 mL (05/17/21 0238)   PRN Meds:.acetaminophen **OR** acetaminophen, naLOXone (NARCAN)  injection  Diet Orders (From admission, onward)     Start     Ordered   05/11/21 0035  Diet NPO time specified  Diet effective now        05/11/21 0035           DVT prophylaxis: enoxaparin (LOVENOX) injection 40 mg Start: 05/11/21 1000 SCDs Start: 05/11/21 0035    Code Status: Full Code  Family Communication: No family at bedside  Status is: Inpatient  Remains inpatient appropriate because: AMS, debility, hypernatremia, IV antibiotics  Level of care: Telemetry  Consultants:  None   Procedures:  None   Microbiology  none  Antimicrobials: Cefepime 10/25 >> 10/28 Augmentin 10/28-2011/1   Objective: Vitals:   05/16/21 1315 05/16/21 2020 05/17/21 0234 05/17/21 0348  BP: 132/72 125/62  91/60  Pulse: (!) 56 64  71  Resp: 17 18  20   Temp: 97.9 F (36.6 C) 97.8 F (36.6 C)  98.2 F (36.8 C)  TempSrc: Oral Axillary  Axillary  SpO2: 100% 100%  98%  Weight:  87.3 kg 87.3 kg     Intake/Output Summary (Last 24 hours) at 05/17/2021 1133 Last data filed at 05/17/2021 0425 Gross per 24 hour  Intake --  Output 2100 ml  Net -2100 ml    Filed Weights   05/16/21 0344 05/16/21  2020 05/17/21 0234  Weight: 87 kg 87.3 kg 87.3 kg    Examination:  Constitutional: NAD Eyes: Anicteric ENMT: Moist mucous membranes Neck: normal, supple Respiratory: CTA bilaterally, no wheezing Cardiovascular: Regular rate and rhythm, no murmurs Abdomen: Soft, NT, ND, bowel sounds positive Musculoskeletal: no clubbing / cyanosis.  Skin: No rashes seen Neurologic: No focal deficits  Data Reviewed: I have independently reviewed following labs and imaging studies   CBC: Recent Labs  Lab 05/10/21 1219 05/11/21 0411 05/12/21 0127 05/13/21 0448 05/14/21 0522 05/16/21 0515  WBC 4.3 4.2 4.6 3.9* 3.1* 2.9*  NEUTROABS 2.2  --   --   --   --   --   HGB 9.9* 9.6* 10.1* 9.4* 8.8* 8.4*  HCT 31.4* 32.0* 32.5* 31.2* 27.7* 26.1*  MCV 96.0 98.2 96.4 97.8 95.2 91.6  PLT 210 200 187  154 133* 117*    Basic Metabolic Panel: Recent Labs  Lab 05/12/21 0127 05/13/21 0448 05/14/21 0522 05/15/21 0851 05/16/21 0515 05/17/21 0454  NA 149* 147* 138 136 137 138  K 4.9 4.6 4.7 5.0 4.4 5.2*  CL 115* 113* 107 104 103 103  CO2 27 27 25 25 28 27   GLUCOSE 67* 138* 106* 114* 119* 84  BUN 58* 50* 47* 41* 44* 47*  CREATININE 1.62* 1.56* 1.30* 1.20 1.10 1.21  CALCIUM 9.0 8.9 8.4* 8.5* 8.6* 8.8*  MG 3.0*  --   --   --  2.0 2.1    Liver Function Tests: Recent Labs  Lab 05/10/21 1219 05/11/21 0411 05/12/21 0127  AST 56* 44* 51*  ALT 123* 112* 106*  ALKPHOS 91 96 103  BILITOT 0.3 0.6 0.6  PROT 7.4 7.5 8.1  ALBUMIN 3.3* 3.3* 3.5    Coagulation Profile: No results for input(s): INR, PROTIME in the last 168 hours. HbA1C: No results for input(s): HGBA1C in the last 72 hours. CBG: Recent Labs  Lab 05/16/21 1610 05/16/21 2023 05/16/21 2303 05/17/21 0351 05/17/21 0806  GLUCAP 85 103* 82 93 84     Recent Results (from the past 240 hour(s))  Resp Panel by RT-PCR (Flu A&B, Covid) Nasopharyngeal Swab     Status: None   Collection Time: 05/10/21  4:22 PM   Specimen: Nasopharyngeal  Swab; Nasopharyngeal(NP) swabs in vial transport medium  Result Value Ref Range Status   SARS Coronavirus 2 by RT PCR NEGATIVE NEGATIVE Final    Comment: (NOTE) SARS-CoV-2 target nucleic acids are NOT DETECTED.  The SARS-CoV-2 RNA is generally detectable in upper respiratory specimens during the acute phase of infection. The lowest concentration of SARS-CoV-2 viral copies this assay can detect is 138 copies/mL. A negative result does not preclude SARS-Cov-2 infection and should not be used as the sole basis for treatment or other patient management decisions. A negative result may occur with  improper specimen collection/handling, submission of specimen other than nasopharyngeal swab, presence of viral mutation(s) within the areas targeted by this assay, and inadequate number of viral copies(<138 copies/mL). A negative result must be combined with clinical observations, patient history, and epidemiological information. The expected result is Negative.  Fact Sheet for Patients:  EntrepreneurPulse.com.au  Fact Sheet for Healthcare Providers:  IncredibleEmployment.be  This test is no t yet approved or cleared by the [place] FDA and  has been authorized for detection and/or diagnosis of SARS-CoV-2 by FDA under an Emergency Use Authorization (EUA). This EUA will remain  in effect (meaning this test can be used) for the duration of the COVID-19 declaration under Section 564(b)(1) of the Act, 21 U.S.C.section 360bbb-3(b)(1), unless the authorization is terminated  or revoked sooner.       Influenza A by PCR NEGATIVE NEGATIVE Final   Influenza B by PCR NEGATIVE NEGATIVE Final    Comment: (NOTE) The Xpert Xpress SARS-CoV-2/FLU/RSV plus assay is intended as an aid in the diagnosis of influenza from Nasopharyngeal swab specimens and should not be used as a sole basis for treatment. Nasal washings and aspirates are unacceptable for Xpert Xpress  SARS-CoV-2/FLU/RSV testing.  Fact Sheet for Patients: EntrepreneurPulse.com.au  Fact Sheet for Healthcare Providers: IncredibleEmployment.be  This test is not yet approved or cleared by the [place] FDA and has been authorized for detection and/or diagnosis of SARS-CoV-2 by FDA under an Emergency Use Authorization (EUA). This EUA will remain in effect (meaning this test can be used) for the duration  of the COVID-19 declaration under Section 564(b)(1) of the Act, 21 U.S.C. section 360bbb-3(b)(1), unless the authorization is terminated or revoked.  Performed at [facility], [place] [address] [place], [place] [address]       Radiology Studies: ECHOCARDIOGRAM COMPLETE  Result Date: 05/16/2021    ECHOCARDIOGRAM REPORT   Patient Name:   FRANKEY WENDLAND Date of Exam: 05/16/2021 Medical Rec #:  [id number]   Height:       75.0 in Accession #:    [id number]  Weight:       191.8 lb Date of Birth:  [birth year]   BSA:          2.156 m Patient Age:    21 years    BP:           120/70 mmHg Patient Gender: M           HR:           49 bpm. Exam Location:  Inpatient Procedure: 2D Echo, Cardiac Doppler and Color Doppler Indications:    R94.31 Abnormal EKG  History:        Patient has no prior history of Echocardiogram examinations.                 Risk Factors:Hypertension.  Sonographer:    Bernadene Person RDCS Referring Phys: [place] Comments: Image acquisition challenging due to uncooperative patient. IMPRESSIONS  1. Left ventricular ejection fraction, by estimation, is 40 to 45%. The left ventricle has mildly decreased function. The left ventricle has no regional wall motion abnormalities. Left ventricular diastolic parameters are consistent with Grade I diastolic dysfunction (impaired relaxation). Difficult study for wall motion assessment as many ieviews are forshortened.  2. Right ventricular systolic function is normal. The  right ventricular size is normal. Tricuspid regurgitation signal is inadequate for assessing PA pressure.  3. A small pericardial effusion is present. The pericardial effusion is circumferential.  4. The mitral valve is normal in structure. No evidence of mitral valve regurgitation. No evidence of mitral stenosis.  5. The aortic valve is tricuspid. Aortic valve regurgitation is not visualized. Mild aortic valve sclerosis is present, with no evidence of aortic valve stenosis.  6. Aortic dilatation noted. There is mild dilatation of the aortic root, measuring 44 mm. There is mild dilatation of the ascending aorta, measuring 44 mm.  7. The inferior vena cava is normal in size with greater than 50% respiratory variability, suggesting right atrial pressure of 3 mmHg.  8. Recommend repeat study with definity contrast FINDINGS  Left Ventricle: Left ventricular ejection fraction, by estimation, is 40 to 45%. The left ventricle has mildly decreased function. The left ventricle has no regional wall motion abnormalities. The left ventricular internal cavity size was normal in size. There is no left ventricular hypertrophy. Left ventricular diastolic parameters are consistent with Grade I diastolic dysfunction (impaired relaxation). Normal left ventricular filling pressure. Right Ventricle: The right ventricular size is normal. No increase in right ventricular wall thickness. Right ventricular systolic function is normal. Tricuspid regurgitation signal is inadequate for assessing PA pressure. Left [facility]: Left atrial size was not well visualized. Right [facility]: Right atrial size was normal in size. Pericardium: A small pericardial effusion is present. The pericardial effusion is circumferential. Mitral Valve: The mitral valve is normal in structure. No evidence of mitral valve regurgitation. No evidence of mitral valve stenosis. Tricuspid Valve: The tricuspid valve is normal in structure. Tricuspid valve regurgitation is  trivial. No evidence  of tricuspid stenosis. Aortic Valve: The aortic valve is tricuspid. Aortic valve regurgitation is not visualized. Mild aortic valve sclerosis is present, with no evidence of aortic valve stenosis. Pulmonic Valve: The pulmonic valve was normal in structure. Pulmonic valve regurgitation is trivial. No evidence of pulmonic stenosis. Aorta: Aortic dilatation noted. There is mild dilatation of the aortic root, measuring 44 mm. There is mild dilatation of the ascending aorta, measuring 44 mm. Venous: The inferior vena cava is normal in size with greater than 50% respiratory variability, suggesting right atrial pressure of 3 mmHg. IAS/Shunts: No atrial level shunt detected by color flow Doppler.  LEFT VENTRICLE PLAX 2D LVIDd:         4.60 cm      Diastology LVIDs:         3.80 cm      LV e' medial:    3.24 cm/s LV PW:         1.10 cm      LV E/e' medial:  13.3 LV IVS:        1.00 cm      LV e' lateral:   4.46 cm/s LVOT diam:     2.20 cm      LV E/e' lateral: 9.7 LV SV:         59 LV SV Index:   27 LVOT Area:     3.80 cm  LV Volumes (MOD) LV vol d, MOD A2C: 168.0 ml LV vol d, MOD A4C: 196.0 ml LV vol s, MOD A2C: 85.6 ml LV vol s, MOD A4C: 125.0 ml LV SV MOD A2C:     82.4 ml LV SV MOD A4C:     196.0 ml LV SV MOD BP:      75.3 ml RIGHT VENTRICLE RV S prime:     9.97 cm/s TAPSE (M-mode): 2.2 cm LEFT ATRIUM             Index        RIGHT ATRIUM           Index LA Biplane Vol: 95.1 ml 44.12 ml/m  RA Area:     15.70 cm                                      RA Volume:   42.80 ml  19.85 ml/m  AORTIC VALVE LVOT Vmax:   50.10 cm/s LVOT Vmean:  39.400 cm/s LVOT VTI:    0.155 m  AORTA Ao Root diam: 4.40 cm Ao Asc diam:  4.40 cm MITRAL VALVE MV Area (PHT): 1.92 cm    SHUNTS MV Decel Time: 396 msec    Systemic VTI:  0.16 m MV E velocity: 43.20 cm/s  Systemic Diam: 2.20 cm MV A velocity: 73.80 cm/s MV E/A ratio:  0.59 Fransico Him MD Electronically signed by Fransico Him MD Signature Date/Time: 05/16/2021/2:13:28  PM    Final     Marzetta Board, MD, PhD [facility]  Between 7 am - 7 pm I am available, please contact me via Amion (for emergencies) or Securechat (non urgent messages)  Between 7 pm - 7 am I am not available, please contact night coverage MD/APP via Amion

## 2021-05-17 NOTE — NC FL2 (Signed)
[place] MEDICAID FL2 LEVEL OF CARE SCREENING TOOL     IDENTIFICATION  Patient Name: Kreig Parson Birthdate: 1945-10-05 Sex: male Admission Date (Current Location): 05/10/2021  [facility] and [place] Number:  Producer, television/film/video and Address:  [facility],  [address] [place], [place] [address]      Provider Number: [id number]  Attending Physician Name and Address:  Leatha Gilding, MD  Relative Name and Phone Number:  [name] dtr [phone]    Current Level of Care: Hospital Recommended Level of Care: Skilled Nursing Facility Prior Approval Number:    Date Approved/Denied:   PASRR Number: [id number]  Discharge Plan: SNF    Current Diagnoses: Patient Active Problem List   Diagnosis Date Noted   Pneumonia 05/10/2021   AMS (altered mental status) 05/10/2021   HCAP (healthcare-associated pneumonia) 04/20/2021   CKD (chronic kidney disease), stage III (HCC) 04/20/2021   Bradycardia 04/20/2021   Severe sepsis (HCC) 04/20/2021   Malnutrition of moderate degree 03/21/2021   Acute metabolic encephalopathy 03/20/2021   Aspiration pneumonia (HCC) 03/20/2021   Dementia (HCC) 03/20/2021   Dysphagia 03/20/2021    Orientation RESPIRATION BLADDER Height & Weight     Self  Normal Incontinent Weight: 87.3 kg Height:     BEHAVIORAL SYMPTOMS/MOOD NEUROLOGICAL BOWEL NUTRITION STATUS      Incontinent Feeding tube (200 ml every 4 hours of free water to supplement feeds)  AMBULATORY STATUS COMMUNICATION OF NEEDS Skin   Extensive Assist Verbally                         Personal Care Assistance Level of Assistance  Bathing, Feeding, Dressing Bathing Assistance: Maximum assistance Feeding assistance: Maximum assistance Dressing Assistance: Maximum assistance     Functional Limitations Info  Sight, Hearing, Speech Sight Info: Adequate Hearing Info: Impaired Speech Info: Adequate    SPECIAL CARE FACTORS FREQUENCY                        Contractures Contractures Info: Not present    Additional Factors Info    Code Status Info: full Allergies Info: NKA           Current Medications (05/17/2021):  This is the current hospital active medication list Current Facility-Administered Medications  Medication Dose Route Frequency Provider Last Rate Last Admin   acetaminophen (TYLENOL) tablet 650 mg  650 mg Oral Q6H PRN Zigmund Daniel., MD   650 mg at 05/14/21 0831   Or   acetaminophen (TYLENOL) suppository 650 mg  650 mg Rectal Q6H PRN Zigmund Daniel., MD       enoxaparin (LOVENOX) injection 40 mg  40 mg Subcutaneous Q24H Zigmund Daniel., MD   40 mg at 05/17/21 0815   feeding supplement (JEVITY 1.5 CAL/FIBER) liquid 1,000 mL  1,000 mL Per Tube Continuous Zigmund Daniel., MD 65 mL/hr at 05/17/21 0238 1,000 mL at 05/17/21 0238   feeding supplement (PROSource TF) liquid 45 mL  45 mL Per Tube BID Zigmund Daniel., MD   45 mL at 05/17/21 7893   free water 200 mL  200 mL Per Tube Q4H Zigmund Daniel., MD   200 mL at 05/17/21 0815   naloxone [facility]) injection 0.4 mg  0.4 mg Intravenous PRN Zigmund Daniel., MD       polyethylene glycol (MIRALAX / GLYCOLAX) packet 17 g  17 g Per Tube Daily  Elodia Florence., MD   17 g at 05/15/21 1022   QUEtiapine (SEROQUEL) tablet 25 mg  25 mg Per Tube QHS Caren Griffins, MD   25 mg at 05/16/21 2253   traZODone (DESYREL) tablet 50 mg  50 mg Oral Once Kayleen Memos, DO         Discharge Medications: Please see discharge summary for a list of discharge medications.  Relevant Imaging Results:  Relevant Lab Results:   Additional Information SS#[id number]  [place], [place]

## 2021-05-17 NOTE — TOC Progression Note (Signed)
Transition of Care [facility]) - Progression Note    Patient Details  Name: Jack Rivas MRN: [id number] Date of Birth: 1945-08-31  Transition of Care [facility]) CM/SW Contact  Ida Rogue, [place] Phone Number: 05/17/2021, 11:20 AM  Clinical Narrative:   Harvin Hazel at [facility] asked that I conduct a bed search for patient as he has participated minimally in therapy. Bed search initiated. TOC will continue to follow during the course of hospitalization.     Expected Discharge Plan: Skilled Nursing Facility Barriers to Discharge: SNF Pending bed offer  Expected Discharge Plan and Services Expected Discharge Plan: Skilled Nursing Facility     Post Acute Care Choice: Skilled Nursing Facility Living arrangements for the past 2 months: Skilled Nursing Facility                                       Social Determinants of Health (SDOH) Interventions    Readmission Risk Interventions Readmission Risk Prevention Plan 03/28/2021  Transportation Screening Complete  PCP or Specialist Appt within 3-5 Days Complete  HRI or Home Care Consult Complete  Social Work Consult for Recovery Care Planning/Counseling Complete  Palliative Care Screening Not Applicable  Medication Review Oceanographer) Complete

## 2021-05-18 DIAGNOSIS — G9341 Metabolic encephalopathy: Secondary | ICD-10-CM

## 2021-05-18 DIAGNOSIS — R0602 Shortness of breath: Secondary | ICD-10-CM

## 2021-05-18 LAB — COMPREHENSIVE METABOLIC PANEL
ALT: 326 U/L — ABNORMAL HIGH (ref 0–44)
AST: 199 U/L — ABNORMAL HIGH (ref 15–41)
Albumin: 2.9 g/dL — ABNORMAL LOW (ref 3.5–5.0)
Alkaline Phosphatase: 98 U/L (ref 38–126)
Anion gap: 8 (ref 5–15)
BUN: 51 mg/dL — ABNORMAL HIGH (ref 8–23)
CO2: 24 mmol/L (ref 22–32)
Calcium: 8.5 mg/dL — ABNORMAL LOW (ref 8.9–10.3)
Chloride: 101 mmol/L (ref 98–111)
Creatinine, Ser: 0.98 mg/dL (ref 0.61–1.24)
GFR, Estimated: >60 mL/min (ref >60)
Glucose, Bld: 93 mg/dL (ref 70–99)
Potassium: 4.3 mmol/L (ref 3.5–5.1)
Sodium: 133 mmol/L — ABNORMAL LOW (ref 135–145)
Total Bilirubin: 0.2 mg/dL — ABNORMAL LOW (ref 0.3–1.2)
Total Protein: 6.8 g/dL (ref 6.5–8.1)

## 2021-05-18 LAB — CBC
HCT: 28 % — ABNORMAL LOW (ref 39.0–52.0)
Hemoglobin: 8.8 g/dL — ABNORMAL LOW (ref 13.0–17.0)
MCH: 29.5 pg (ref 26.0–34.0)
MCHC: 31.4 g/dL (ref 30.0–36.0)
MCV: 94 fL (ref 80.0–100.0)
Platelets: 109 10*3/uL — ABNORMAL LOW (ref 150–400)
RBC: 2.98 MIL/uL — ABNORMAL LOW (ref 4.22–5.81)
RDW: 13.7 % (ref 11.5–15.5)
WBC: 3.2 10*3/uL — ABNORMAL LOW (ref 4.0–10.5)
nRBC: 0 % (ref 0.0–0.2)

## 2021-05-18 LAB — GLUCOSE, CAPILLARY
Glucose-Capillary: 105 mg/dL — ABNORMAL HIGH (ref 70–99)
Glucose-Capillary: 110 mg/dL — ABNORMAL HIGH (ref 70–99)
Glucose-Capillary: 103 mg/dL — ABNORMAL HIGH (ref 70–99)
Glucose-Capillary: 111 mg/dL — ABNORMAL HIGH (ref 70–99)
Glucose-Capillary: 83 mg/dL (ref 70–99)
Glucose-Capillary: 102 mg/dL — ABNORMAL HIGH (ref 70–99)

## 2021-05-18 LAB — MAGNESIUM: Magnesium: 2.1 mg/dL (ref 1.7–2.4)

## 2021-05-18 MED ORDER — HALOPERIDOL LACTATE 5 MG/ML IJ SOLN
2.0000 mg | Freq: Four times a day (QID) | INTRAMUSCULAR | Status: DC | PRN
  Administered 2021-05-18 (×2): 2 mg via INTRAVENOUS
  Filled 2021-05-18 (×2): qty 1

## 2021-05-18 MED ORDER — ACETAMINOPHEN 160 MG/5ML PO SOLN: 325.0000 mg | Freq: Four times a day (QID) | ORAL | Status: DC | PRN

## 2021-05-18 MED ORDER — ACETAMINOPHEN 160 MG/5ML PO SOLN: 500.0000 mg | Freq: Four times a day (QID) | ORAL | Status: DC | PRN

## 2021-05-18 NOTE — Progress Notes (Signed)
Jack Rivas  QAS:[id number] DOB: 11-26-1945 DOA: 05/10/2021 PCP: Patient, No Pcp Per (Inactive)    Brief Narrative:  75 year old with a history of dementia, bedbound state, dysphagia status post feeding tube, HTN, and recent hospital stay for pneumonia who returned to the ED with altered mental status, bradycardia, and shallow respirations.  Patient was found to be dyspneic apneic and listless.  He was dosed with Narcan and IV fluids which improved his respiratory rate and level of interaction.  Consultants:  None  Code Status: FULL CODE  Antimicrobials:  Cefepime 10/25 > 10/28 Augmentin 10/28 > 11/1  DVT prophylaxis: Lovenox  Subjective: Alert but confused the time of my evaluation, asking me to call a total truck.  Denies pain but history not likely reliable.  Became quite agitated later in the day requiring sedative.  Assessment & Plan:  Acute metabolic encephalopathy Exact etiology unclear -possible aspiration -UDS negative -folic acid and B12 not low -TSH normal -EEG without evidence of seizure -Depakote discontinued -mental status appears to be back to baseline which is alert and confused with intermittent agitation  Dysphagia -aspiration pneumonia CXR noted small rounded opacity LUL -has completed a course of Augmentin -continue PEG tube feeding  Hypernatremia Due to free water deficit -continue free water via PEG and check labs intermittently  Dementia Likely at his baseline presently  18 beat run of wide QRS Electrolytes balanced -TTE notes EF 40-45% with grade 1 DD -asymptomatic  CKD stage IIIb Baseline creatinine approximately 1.6 -stable and presently better than his baseline  Hypoglycemia No recurrence during hospital stay with consistent NG feeding  Acute left maxillary sinusitis Noted on CT scan -has been treated with a course of antibiotics  Mild transaminitis Etiology not clear -possibly medication related -recheck in a.m.  Fall PTA Found out of  bed by daughter prior to admission -CT head neck without acute findings  Goals of care The patient has 2 daughters who both desire ongoing aggressive care with the ultimate plan for discharge home though this will not be an option for 1-2 months    Family Communication: No family present at time of exam Status is: Inpatient -medically ready for discharge to SNF -family request different SNF with search presently ongoing   Objective: Blood pressure 127/71, pulse 74, temperature 97.9 F (36.6 C), temperature source Axillary, resp. rate 20, weight 89.1 kg, SpO2 96 %. No intake or output data in the 24 hours ending 05/18/21 0959 Filed Weights   05/17/21 0234 05/17/21 1943 05/18/21 0230  Weight: 87.3 kg 89.1 kg 89.1 kg    Examination: General: No acute respiratory distress Lungs: Clear to auscultation bilaterally without wheezes or crackles Cardiovascular: Regular rate and rhythm without murmur gallop or rub normal S1 and S2 Abdomen: Nontender, nondistended, soft, bowel sounds positive, no rebound, no ascites, no appreciable mass -PEG insertion clean and dry Extremities: No significant cyanosis, clubbing, or edema bilateral lower extremities  CBC: Recent Labs  Lab 05/14/21 0522 05/16/21 0515 05/18/21 0520  WBC 3.1* 2.9* 3.2*  HGB 8.8* 8.4* 8.8*  HCT 27.7* 26.1* 28.0*  MCV 95.2 91.6 94.0  PLT 133* 117* 109*   Basic Metabolic Panel: Recent Labs  Lab 05/16/21 0515 05/17/21 0454 05/18/21 0520  NA 137 138 133*  K 4.4 5.2* 4.3  CL 103 103 101  CO2 28 27 24   GLUCOSE 119* 84 93  BUN 44* 47* 51*  CREATININE 1.10 1.21 0.98  CALCIUM 8.6* 8.8* 8.5*  MG 2.0 2.1 2.1   GFR: Estimated  Creatinine Clearance: 77.8 mL/min (by C-G formula based on SCr of 0.98 mg/dL).  Liver Function Tests: Recent Labs  Lab 05/12/21 0127 05/18/21 0520  AST 51* 199*  ALT 106* 326*  ALKPHOS 103 98  BILITOT 0.6 0.2*  PROT 8.1 6.8  ALBUMIN 3.5 2.9*     CBG: Recent Labs  Lab 05/17/21 1631  05/17/21 1947 05/17/21 2313 05/18/21 0335 05/18/21 0738  GLUCAP 88 106* 98 105* 110*    Recent Results (from the past 240 hour(s))  Resp Panel by RT-PCR (Flu A&B, Covid) Nasopharyngeal Swab     Status: None   Collection Time: 05/10/21  4:22 PM   Specimen: Nasopharyngeal Swab; Nasopharyngeal(NP) swabs in vial transport medium  Result Value Ref Range Status   SARS Coronavirus 2 by RT PCR NEGATIVE NEGATIVE Final    Comment: (NOTE) SARS-CoV-2 target nucleic acids are NOT DETECTED.  The SARS-CoV-2 RNA is generally detectable in upper respiratory specimens during the acute phase of infection. The lowest concentration of SARS-CoV-2 viral copies this assay can detect is 138 copies/mL. A negative result does not preclude SARS-Cov-2 infection and should not be used as the sole basis for treatment or other patient management decisions. A negative result may occur with  improper specimen collection/handling, submission of specimen other than nasopharyngeal swab, presence of viral mutation(s) within the areas targeted by this assay, and inadequate number of viral copies(<138 copies/mL). A negative result must be combined with clinical observations, patient history, and epidemiological information. The expected result is Negative.  Fact Sheet for Patients:  BloggerCourse.com  Fact Sheet for Healthcare Providers:  SeriousBroker.it  This test is no t yet approved or cleared by the [place] FDA and  has been authorized for detection and/or diagnosis of SARS-CoV-2 by FDA under an Emergency Use Authorization (EUA). This EUA will remain  in effect (meaning this test can be used) for the duration of the COVID-19 declaration under Section 564(b)(1) of the Act, 21 U.S.C.section 360bbb-3(b)(1), unless the authorization is terminated  or revoked sooner.       Influenza A by PCR NEGATIVE NEGATIVE Final   Influenza B by PCR NEGATIVE NEGATIVE  Final    Comment: (NOTE) The Xpert Xpress SARS-CoV-2/FLU/RSV plus assay is intended as an aid in the diagnosis of influenza from Nasopharyngeal swab specimens and should not be used as a sole basis for treatment. Nasal washings and aspirates are unacceptable for Xpert Xpress SARS-CoV-2/FLU/RSV testing.  Fact Sheet for Patients: BloggerCourse.com  Fact Sheet for Healthcare Providers: SeriousBroker.it  This test is not yet approved or cleared by the [place] FDA and has been authorized for detection and/or diagnosis of SARS-CoV-2 by FDA under an Emergency Use Authorization (EUA). This EUA will remain in effect (meaning this test can be used) for the duration of the COVID-19 declaration under Section 564(b)(1) of the Act, 21 U.S.C. section 360bbb-3(b)(1), unless the authorization is terminated or revoked.  Performed at [facility], [address], [place], [place] [address]      Scheduled Meds:  enoxaparin (LOVENOX) injection  40 mg Subcutaneous Q24H   feeding supplement (PROSource TF)  45 mL Per Tube BID   free water  200 mL Per Tube Q4H   polyethylene glycol  17 g Per Tube Daily   QUEtiapine  25 mg Per Tube QHS   traZODone  50 mg Oral Once   Continuous Infusions:  feeding supplement (JEVITY 1.5 CAL/FIBER) 1,000 mL (05/17/21 1746)     LOS: 8 days   Tinnie Gens  Hennie Duos, MD [facility] Office  [phone] Pager - Text Page per Amion  If 7PM-7AM, please contact night-coverage per Amion 05/18/2021, 9:59 AM

## 2021-05-18 NOTE — TOC Progression Note (Signed)
Transition of Care [facility]) - Progression Note    Patient Details  Name: Jack Rivas MRN: [id number] Date of Birth: [birth year]  Transition of Care [facility]) CM/SW Contact  Jack Rivas, [place] Phone Number: 05/18/2021, 4:05 PM  Clinical Narrative:  Spoke with daughter about bed offer in [place].  When she found out that her father would be in a semi-private room, she rejected that offer.  She stated she and Jack Rivas at [facility] have not spoken yet.  I texted Jack Rivas and asked her to call Jack Rivas so they could each clarify concerns and expectations so we can get patient, who is medically stable , back to facility. She texted back that she would call.  Will follow up with Jack Rivas in AM. TOC will continue to follow during the course of hospitalization.      Expected Discharge Plan: Skilled Nursing Facility Barriers to Discharge: SNF Pending bed offer  Expected Discharge Plan and Services Expected Discharge Plan: Skilled Nursing Facility     Post Acute Care Choice: Skilled Nursing Facility Living arrangements for the past 2 months: Skilled Nursing Facility                                       Social Determinants of Health (SDOH) Interventions    Readmission Risk Interventions Readmission Risk Prevention Plan 03/28/2021  Transportation Screening Complete  PCP or Specialist Appt within 3-5 Days Complete  HRI or Home Care Consult Complete  Social Work Consult for Recovery Care Planning/Counseling Complete  Palliative Care Screening Not Applicable  Medication Review Oceanographer) Complete

## 2021-05-19 LAB — COMPREHENSIVE METABOLIC PANEL
ALT: 315 U/L — ABNORMAL HIGH (ref 0–44)
AST: 177 U/L — ABNORMAL HIGH (ref 15–41)
Albumin: 2.9 g/dL — ABNORMAL LOW (ref 3.5–5.0)
Alkaline Phosphatase: 92 U/L (ref 38–126)
Anion gap: 10 (ref 5–15)
BUN: 50 mg/dL — ABNORMAL HIGH (ref 8–23)
CO2: 21 mmol/L — ABNORMAL LOW (ref 22–32)
Calcium: 9 mg/dL (ref 8.9–10.3)
Chloride: 105 mmol/L (ref 98–111)
Creatinine, Ser: 0.86 mg/dL (ref 0.61–1.24)
GFR, Estimated: >60 mL/min (ref >60)
Glucose, Bld: 84 mg/dL (ref 70–99)
Potassium: 5.3 mmol/L — ABNORMAL HIGH (ref 3.5–5.1)
Sodium: 136 mmol/L (ref 135–145)
Total Bilirubin: 0.5 mg/dL (ref 0.3–1.2)
Total Protein: 6.8 g/dL (ref 6.5–8.1)

## 2021-05-19 LAB — RESP PANEL BY RT-PCR (FLU A&B, COVID) ARPGX2
Influenza A by PCR: NEGATIVE
Influenza B by PCR: NEGATIVE
SARS Coronavirus 2 by RT PCR: NEGATIVE

## 2021-05-19 LAB — GLUCOSE, CAPILLARY
Glucose-Capillary: 77 mg/dL (ref 70–99)
Glucose-Capillary: 91 mg/dL (ref 70–99)
Glucose-Capillary: 89 mg/dL (ref 70–99)

## 2021-05-19 MED ORDER — FREE WATER: 200.0000 mL | Status: DC

## 2021-05-19 MED ORDER — ACETAMINOPHEN 160 MG/5ML PO SOLN: 325.0000 mg | Freq: Four times a day (QID) | ORAL | 0 refills | Status: DC | PRN

## 2021-05-19 MED ORDER — QUETIAPINE FUMARATE 25 MG PO TABS: 25.0000 mg | ORAL_TABLET | Freq: Every day | ORAL | Status: DC

## 2021-05-19 NOTE — Discharge Summary (Signed)
DISCHARGE SUMMARY  Jack Rivas  MR#: [id number]  DOB:1946/05/11  Date of Admission: 05/10/2021 Date of Discharge: 05/19/2021  Attending Physician:Ahlivia Salahuddin Hennie Duos, MD  Patient's QP:[id number], No Pcp Per (Inactive)  Consults: none  Disposition: D/C to SNF    Follow-up Appts:  Contact information for after-discharge care     Destination     [facility] Preferred SNF .   Service: Skilled Nursing Contact information: [address] [phone]                     Tests Needing Follow-up: -recheck sodium intermittently to adjust dose of free water as indicated -recheck LFTs in 3-4 days   Discharge Diagnoses: Idiopathic Acute metabolic encephalopathy Dysphagia Aspiration pneumonitis  Hypernatremia Dementia 18 beat run of Ventricular Tachycardia  CKD stage IIIb Hypoglycemia Acute left maxillary sinusitis Mild transaminitis Fall PTA Goals of care discussions  Initial presentation: 75yo with a history of dementia, bedbound state, dysphagia status post feeding tube, HTN, and recent hospital stay for pneumonia who returned to the ED with altered mental status, bradycardia, and shallow respirations.  Patient was found to be dyspneic apneic and listless.  He was dosed with Narcan and IV fluids which improved his respiratory rate and level of interaction.  Hospital Course:  Acute metabolic encephalopathy Exact etiology unclear - possible aspiration event - UDS negative - folic acid and [id number] not low -TSH normal -EEG without evidence of seizure - Depakote discontinued - mental status appears to be back to baseline at time of d/c which is alert and confused with intermittent mild agitation   Dysphagia - aspiration pneumonia/pneumonitis  CXR noted small rounded opacity LUL - completed a course of Augmentin - continue PEG tube feeding -oxygen saturations 94% on room air at time of discharge   Hypernatremia Due to free  water deficit - continue free water via PEG and check labs intermittently   Dementia Felt to be at his baseline at time of discharge   18 beat run of ventricular tachycardia Electrolytes balanced - TTE noted EF 40-45% with grade 1 DD - asymptomatic   CKD stage IIIb Baseline creatinine approximately 1.6 - stable and better than his baseline at time of discharge   Hypoglycemia No recurrence during hospital stay with consistent NG feeding   Acute left maxillary sinusitis Noted on CT scan - has been treated with a course of antibiotics during this admission   Mild transaminitis Etiology not clear - possibly medication related - needs recheck in 3-4 days - asymptomatic    Fall PTA Found out of bed by daughter prior to admission - CT head neck without acute findings   Goals of care The patient has 2 daughters who both desire ongoing aggressive care with the ultimate plan for discharge home though this will not be an option for 1-2 months as the family is currently moving to a different home    Allergies as of 05/19/2021   No Known Allergies      Medication List     STOP taking these medications    acetaminophen 325 MG tablet Commonly known as: TYLENOL Replaced by: acetaminophen 160 MG/5ML solution   diclofenac Sodium 1 % Gel Commonly known as: VOLTAREN   divalproex 125 MG capsule Commonly known as: DEPAKOTE SPRINKLE   melatonin 3 MG Tabs tablet   nystatin 100000 UNIT/ML suspension Commonly known as: MYCOSTATIN       TAKE these medications    acetaminophen 160 MG/5ML solution  Commonly known as: TYLENOL Place 10.2 mLs (325 mg total) into feeding tube every 6 (six) hours as needed for mild pain, headache or fever. Replaces: acetaminophen 325 MG tablet   famotidine 20 MG tablet Commonly known as: PEPCID Place 20 mg into feeding tube at bedtime.   feeding supplement (JEVITY 1.5 CAL/FIBER) Liqd Place 1,000 mLs into feeding tube continuous.   feeding supplement  (PROSource TF) liquid Place 45 mLs into feeding tube 2 (two) times daily.   free water Soln Place 200 mLs into feeding tube every 4 (four) hours. What changed: how much to take   polyethylene glycol powder 17 GM/SCOOP powder Commonly known as: GLYCOLAX/MIRALAX Place 17 g into feeding tube See admin instructions. Mix 17 grams of powder as directed and give, per G-Tube, once a day   QUEtiapine 25 MG tablet Commonly known as: SEROQUEL Place 1 tablet (25 mg total) into feeding tube at bedtime. What changed:  medication strength how much to take        Day of Discharge BP 114/73 (BP Location: Left Arm)   Pulse 65   Temp 97.9 F (36.6 C) (Axillary)   Resp 20   Wt 86.7 kg   SpO2 94%   BMI 23.89 kg/m   Physical Exam: General: No acute respiratory distress Lungs: Clear to auscultation bilaterally without wheezes or crackles Cardiovascular: Regular rate and rhythm without murmur gallop or rub normal S1 and S2 Abdomen: Nontender, nondistended, soft, bowel sounds positive, no rebound, no ascites, no appreciable mass - PEG insertion clean and dry  Extremities: No significant cyanosis, clubbing, or edema bilateral lower extremities  Basic Metabolic Panel: Recent Labs  Lab 05/14/21 0522 05/15/21 0851 05/16/21 0515 05/17/21 0454 05/18/21 0520  NA 138 136 137 138 133*  K 4.7 5.0 4.4 5.2* 4.3  CL 107 104 103 103 101  CO2 25 25 28 27 24   GLUCOSE 106* 114* 119* 84 93  BUN 47* 41* 44* 47* 51*  CREATININE 1.30* 1.20 1.10 1.21 0.98  CALCIUM 8.4* 8.5* 8.6* 8.8* 8.5*  MG  --   --  2.0 2.1 2.1    Liver Function Tests: Recent Labs  Lab 05/18/21 0520  AST 199*  ALT 326*  ALKPHOS 98  BILITOT 0.2*  PROT 6.8  ALBUMIN 2.9*    CBC: Recent Labs  Lab 05/13/21 0448 05/14/21 0522 05/16/21 0515 05/18/21 0520  WBC 3.9* 3.1* 2.9* 3.2*  HGB 9.4* 8.8* 8.4* 8.8*  HCT 31.2* 27.7* 26.1* 28.0*  MCV 97.8 95.2 91.6 94.0  PLT 154 133* 117* 109*    CBG: Recent Labs  Lab  05/18/21 1626 05/18/21 2106 05/18/21 2307 05/19/21 0336 05/19/21 0820  GLUCAP 111* 83 102* 77 91    Recent Results (from the past 240 hour(s))  Resp Panel by RT-PCR (Flu A&B, Covid) Nasopharyngeal Swab     Status: None   Collection Time: 05/10/21  4:22 PM   Specimen: Nasopharyngeal Swab; Nasopharyngeal(NP) swabs in vial transport medium  Result Value Ref Range Status   SARS Coronavirus 2 by RT PCR NEGATIVE NEGATIVE Final    Comment: (NOTE) SARS-CoV-2 target nucleic acids are NOT DETECTED.  The SARS-CoV-2 RNA is generally detectable in upper respiratory specimens during the acute phase of infection. The lowest concentration of SARS-CoV-2 viral copies this assay can detect is 138 copies/mL. A negative result does not preclude SARS-Cov-2 infection and should not be used as the sole basis for treatment or other patient management decisions. A negative result may occur with  improper  specimen collection/handling, submission of specimen other than nasopharyngeal swab, presence of viral mutation(s) within the areas targeted by this assay, and inadequate number of viral copies(<138 copies/mL). A negative result must be combined with clinical observations, patient history, and epidemiological information. The expected result is Negative.  Fact Sheet for Patients:  BloggerCourse.com  Fact Sheet for Healthcare Providers:  SeriousBroker.it  This test is no t yet approved or cleared by the [place] FDA and  has been authorized for detection and/or diagnosis of SARS-CoV-2 by FDA under an Emergency Use Authorization (EUA). This EUA will remain  in effect (meaning this test can be used) for the duration of the COVID-19 declaration under Section 564(b)(1) of the Act, 21 U.S.C.section 360bbb-3(b)(1), unless the authorization is terminated  or revoked sooner.       Influenza A by PCR NEGATIVE NEGATIVE Final   Influenza B by PCR  NEGATIVE NEGATIVE Final    Comment: (NOTE) The Xpert Xpress SARS-CoV-2/FLU/RSV plus assay is intended as an aid in the diagnosis of influenza from Nasopharyngeal swab specimens and should not be used as a sole basis for treatment. Nasal washings and aspirates are unacceptable for Xpert Xpress SARS-CoV-2/FLU/RSV testing.  Fact Sheet for Patients: BloggerCourse.com  Fact Sheet for Healthcare Providers: SeriousBroker.it  This test is not yet approved or cleared by the [place] FDA and has been authorized for detection and/or diagnosis of SARS-CoV-2 by FDA under an Emergency Use Authorization (EUA). This EUA will remain in effect (meaning this test can be used) for the duration of the COVID-19 declaration under Section 564(b)(1) of the Act, 21 U.S.C. section 360bbb-3(b)(1), unless the authorization is terminated or revoked.  Performed at [facility], [address], [place], [place] [address]   Resp Panel by RT-PCR (Flu A&B, Covid) Nasopharyngeal Swab     Status: None   Collection Time: 05/19/21 10:43 AM   Specimen: Nasopharyngeal Swab; Nasopharyngeal(NP) swabs in vial transport medium  Result Value Ref Range Status   SARS Coronavirus 2 by RT PCR NEGATIVE NEGATIVE Final    Comment: (NOTE) SARS-CoV-2 target nucleic acids are NOT DETECTED.  The SARS-CoV-2 RNA is generally detectable in upper respiratory specimens during the acute phase of infection. The lowest concentration of SARS-CoV-2 viral copies this assay can detect is 138 copies/mL. A negative result does not preclude SARS-Cov-2 infection and should not be used as the sole basis for treatment or other patient management decisions. A negative result may occur with  improper specimen collection/handling, submission of specimen other than nasopharyngeal swab, presence of viral mutation(s) within the areas targeted by this assay, and inadequate number of  viral copies(<138 copies/mL). A negative result must be combined with clinical observations, patient history, and epidemiological information. The expected result is Negative.  Fact Sheet for Patients:  BloggerCourse.com  Fact Sheet for Healthcare Providers:  SeriousBroker.it  This test is no t yet approved or cleared by the [place] FDA and  has been authorized for detection and/or diagnosis of SARS-CoV-2 by FDA under an Emergency Use Authorization (EUA). This EUA will remain  in effect (meaning this test can be used) for the duration of the COVID-19 declaration under Section 564(b)(1) of the Act, 21 U.S.C.section 360bbb-3(b)(1), unless the authorization is terminated  or revoked sooner.       Influenza A by PCR NEGATIVE NEGATIVE Final   Influenza B by PCR NEGATIVE NEGATIVE Final    Comment: (NOTE) The Xpert Xpress SARS-CoV-2/FLU/RSV plus assay is intended as an aid in the diagnosis  of influenza from Nasopharyngeal swab specimens and should not be used as a sole basis for treatment. Nasal washings and aspirates are unacceptable for Xpert Xpress SARS-CoV-2/FLU/RSV testing.  Fact Sheet for Patients: EntrepreneurPulse.com.au  Fact Sheet for Healthcare Providers: IncredibleEmployment.be  This test is not yet approved or cleared by the [place] FDA and has been authorized for detection and/or diagnosis of SARS-CoV-2 by FDA under an Emergency Use Authorization (EUA). This EUA will remain in effect (meaning this test can be used) for the duration of the COVID-19 declaration under Section 564(b)(1) of the Act, 21 U.S.C. section 360bbb-3(b)(1), unless the authorization is terminated or revoked.  Performed at [facility], [place] [address] [place], [place] [address]       Time spent in discharge (includes decision making & examination of pt): 35  minutes  05/19/2021, 12:11 PM   Cherene Altes, MD [facility] Office  [phone]

## 2021-05-19 NOTE — Progress Notes (Signed)
Pt discharged to facility. PIV removed. VSS. AVS printed and placed in pt envelope for facility use. PEG in place. Report called and given to nurse at facility. No further questions at this time.

## 2021-05-19 NOTE — TOC Transition Note (Signed)
Transition of Care [facility]) - CM/SW Discharge Note   Patient Details  Name: Jack Rivas MRN: [id number] Date of Birth: [birth year]  [facility]) CM/SW Contact:  Ida Rogue, LCSW Phone Number: 05/19/2021, 11:24 AM   Clinical Narrative:   Harvin Hazel and daughter have spoken, paving the way for patient's return to [facility], according to [place].  I had called daughter earlier today. And when she told me she and Kelli had not spoken yesterday, I requested she call Kelli. PTAR arranged. Nursing, please call report to [phone], room 121. TOC sign off.    Final next level of care: Skilled Nursing Facility Barriers to Discharge: Barriers Resolved   Patient Goals and CMS Choice     Choice offered to / list presented to : Adult Children  Discharge Placement                       Discharge Plan and Services     Post Acute Care Choice: Skilled Nursing Facility                               Social Determinants of Health (SDOH) Interventions     Readmission Risk Interventions Readmission Risk Prevention Plan 03/28/2021  Transportation Screening Complete  PCP or Specialist Appt within 3-5 Days Complete  HRI or Home Care Consult Complete  Social Work Consult for Recovery Care Planning/Counseling Complete  Palliative Care Screening Not Applicable  Medication Review Oceanographer) Complete

## 2021-06-10 ENCOUNTER — Encounter (HOSPITAL_COMMUNITY): Payer: Self-pay | Admitting: Radiology

## 2021-08-23 ENCOUNTER — Other Ambulatory Visit (HOSPITAL_COMMUNITY): Payer: Self-pay

## 2021-08-23 DIAGNOSIS — R1310 Dysphagia, unspecified: Secondary | ICD-10-CM

## 2021-08-23 DIAGNOSIS — R059 Cough, unspecified: Secondary | ICD-10-CM

## 2021-08-24 ENCOUNTER — Non-Acute Institutional Stay: Payer: Medicare Other | Admitting: Hospice

## 2021-08-24 ENCOUNTER — Other Ambulatory Visit: Payer: Self-pay

## 2021-08-26 ENCOUNTER — Other Ambulatory Visit: Payer: Self-pay

## 2021-08-26 ENCOUNTER — Non-Acute Institutional Stay: Payer: Medicare Other | Admitting: Hospice

## 2021-08-26 DIAGNOSIS — F0150 Vascular dementia without behavioral disturbance: Secondary | ICD-10-CM

## 2021-08-26 DIAGNOSIS — Z515 Encounter for palliative care: Secondary | ICD-10-CM

## 2021-08-26 DIAGNOSIS — G4700 Insomnia, unspecified: Secondary | ICD-10-CM

## 2021-08-26 DIAGNOSIS — R1310 Dysphagia, unspecified: Secondary | ICD-10-CM

## 2021-08-26 NOTE — Progress Notes (Signed)
[facility] Consult Note Telephone: [phone]  Fax: [phone]  PATIENT NAME: Jack Rivas [address] [place] [address] [phone] (home)  DOB: 12/21/1945 MRN: [id number]  PRIMARY CARE PROVIDER:    Dr. Garwin Brothers  REFERRING PROVIDER:   Dr. Garwin Brothers  RESPONSIBLE PARTY:   [place] Information     Name Relation Home Work Mobile   Tarpley,[name] Daughter   [phone]        I met face to face with patient at facility. Palliative Care was asked to follow this patient by consultation request of Dr. Garwin Brothers to address advance care planning, complex medical decision making and goals of care clarification.     ASSESSMENT AND / RECOMMENDATIONS:   CODE STATUS: Patient is a full code  Goals of Care: Goals include to maximize quality of life and symptom management  Symptom Management/Plan: Dementia: Advanced, memory loss/confusion, incontinent of bowel and bladder, FAST 7A.  Safety/fall precautions.  Continue ongoing supportive care. Insomnia: Encourage sleep hygiene.  Continue melatonin as ordered at. Dysphagia: NPO.  Continue ongoing enteral PEG tube feeding.  Patient is scheduled for barium swallow study next week.  ST consult as needed.  Follow up: Palliative care will continue to follow for complex medical decision making, advance care planning, and clarification of goals. Return 6 weeks or prn.Encouraged to call provider sooner with any concerns.   Family /Caregiver/Community Supports: Patient in SNF for ongoing care  HOSPICE ELIGIBILITY/DIAGNOSIS: TBD  Chief Complaint: Follow up visit  HISTORY OF PRESENT ILLNESS:  Jack Rivas is a 76 y.o. year old male  with multiple medical conditions including dementia, advanced with associated memory loss/confusion in line with dementia disease trajectory.  History of  dysphagia, hypertension, pneumonitis due to inhalation of food and vomit.  Patient with limited  communication during visit, no signs of distress-FLACC 0. History obtained from review of EMR, discussion with primary team, caregiver, family and/or Mr. Errico.  Review and summarization of Epic records shows history from other than patient. Rest of 10 point ROS asked and negative.    Physical Exam: Constitutional: NAD General: Well groomed, cooperative, FLACC 0 EYES: anicteric sclera, lids intact, no discharge  ENMT: Moist mucous membrane CV: S1 S2, RRR, no LE edema Pulmonary: no increased work of breathing, no cough, Abdomen: active BS + 4 quadrants, soft and non tender; PEG tube in place GU: no suprapubic tenderness MSK: weakness, sarcopenia, limited ROM Skin: warm and dry, no rashes or wounds on visible skin Neuro:  weakness, otherwise non focal.  Memory loss/confusion Psych: non-anxious affect Hem/lymph/immuno: no widespread bruising   PAST MEDICAL HISTORY:  Active Ambulatory Problems    Diagnosis Date Noted   Aspiration pneumonia ([place]) 03/20/2021   Dementia ([place]) 03/20/2021   Dysphagia 03/20/2021   Malnutrition of moderate degree 03/21/2021   HCAP (healthcare-associated pneumonia) 04/20/2021   CKD (chronic kidney disease), stage III ([place]) 04/20/2021   Bradycardia 04/20/2021   Severe sepsis ([place]) 04/20/2021   Resolved Ambulatory Problems    Diagnosis Date Noted   Acute metabolic encephalopathy 18/56/3149   Pneumonia 05/10/2021   AMS (altered mental status) 05/10/2021   Past Medical History:  Diagnosis Date   Hypertension    Uses feeding tube     SOCIAL HX:  Social History   Tobacco Use   Smoking status: Light Smoker   Smokeless tobacco: Never  Substance Use Topics   Alcohol use: Yes     ALLERGIES: No Known Allergies  PERTINENT MEDICATIONS:  Outpatient Encounter Medications as of 08/26/2021  Medication Sig   acetaminophen (TYLENOL) 160 MG/5ML solution Place 10.2 mLs (325 mg total) into feeding tube every 6 (six) hours as needed for mild pain, headache  or fever.   famotidine (PEPCID) 20 MG tablet Place 20 mg into feeding tube at bedtime.   Nutritional Supplements (FEEDING SUPPLEMENT, JEVITY 1.5 CAL/FIBER,) LIQD Place 1,000 mLs into feeding tube continuous.   Nutritional Supplements (FEEDING SUPPLEMENT, PROSOURCE TF,) liquid Place 45 mLs into feeding tube 2 (two) times daily.   polyethylene glycol powder (GLYCOLAX/MIRALAX) 17 GM/SCOOP powder Place 17 g into feeding tube See admin instructions. Mix 17 grams of powder as directed and give, per G-Tube, once a day   QUEtiapine (SEROQUEL) 25 MG tablet Place 1 tablet (25 mg total) into feeding tube at bedtime.   Water For Irrigation, Sterile (FREE WATER) SOLN Place 200 mLs into feeding tube every 4 (four) hours.   No facility-administered encounter medications on file as of 08/26/2021.  I spent 45 minutes providing this consultation; this includes time spent with patient/family, chart review and documentation. More than 50% of the time in this consultation was spent on counseling and coordinating communication  Thank you for the opportunity to participate in the care of Mr. Hedden.  The palliative care team will continue to follow. Please call our office at [phone] if we can be of additional assistance.   Note: Portions of this note were generated with Lobbyist. Dictation errors may occur despite best attempts at proofreading.  Teodoro Spray, NP

## 2021-08-30 ENCOUNTER — Other Ambulatory Visit: Payer: Self-pay

## 2021-08-30 ENCOUNTER — Ambulatory Visit (HOSPITAL_COMMUNITY)
Admission: RE | Admit: 2021-08-30 | Discharge: 2021-08-30 | Disposition: A | Discharge status: Home / Self Care | Payer: Medicare Other | Source: Ambulatory Visit | Attending: Internal Medicine | Admitting: Internal Medicine

## 2021-08-30 DIAGNOSIS — R1310 Dysphagia, unspecified: Secondary | ICD-10-CM | POA: Insufficient documentation

## 2021-08-30 DIAGNOSIS — R059 Cough, unspecified: Secondary | ICD-10-CM | POA: Insufficient documentation

## 2021-08-30 NOTE — Therapy (Signed)
Modified Barium Swallow Progress Note  Patient Details  Name: Rocko Fesperman MRN: [id number] Date of Birth: [birth year]  Today's Date: 08/30/2021  Modified Barium Swallow completed.  Full report located under Chart Review in the Imaging Section.  Brief recommendations include the following:  Clinical Impression  Patient presents with a moderate oral and a mild pharyngeal phase dysphagia with impact from cognitive impairment. During oral phase, patient required cues for awareness to cup/spoon for adequate oral preparatory phase . He was not able to effectively hold cup or spoon and thus was total assist with PO intake. All boluses tested resulted in delayed anterior to posterior transit in oral cavity with heavier boluses such as puree slower to transit. During pharyngeal phase, patient exhibited swallow initiation delay to vallecular sinus consistently and did exhibit delays with thins to pyriform sinus when his alertness/attention was beginning to decline. Nectar thick liquids resulted in mild-mod vallecular and pyriform sinus residuals which cleared with subsequent swallows. Swallow was initiated at level of vallecular sinus with puree solids but no pharyngeal residuals observed. Patient did exhibit trace to min amount of vallecular and pyriform sinus residuals with thin liquids however these cleared with second swallow. Even with larger sips from cup of thin liquids,, patient did not exhibit aspiration and there was only one instance of trace, silent penetration above vocal cords with thin liquids which occured during the swallow. SLP reviewed recommendations with daughter and provided her with writtent recommendations and swallow safety precautions.   Swallow Evaluation Recommendations       SLP Diet Recommendations: Dysphagia 1 (Puree) solids;Thin liquid       Medication Administration: Crushed with puree   Supervision: Full assist for feeding;Staff to assist with self feeding;Full  supervision/cueing for compensatory strategies   Compensations: Slow rate;Small sips/bites;Minimize environmental distractions       Oral Care Recommendations: Staff/trained caregiver to provide oral care;Oral care before and after PO        Angela Nevin, MA, CCC-SLP Speech Therapy

## 2021-10-07 ENCOUNTER — Non-Acute Institutional Stay: Payer: Medicare Other | Admitting: Hospice

## 2021-10-07 ENCOUNTER — Other Ambulatory Visit: Payer: Self-pay

## 2021-10-07 DIAGNOSIS — F03918 Unspecified dementia, unspecified severity, with other behavioral disturbance: Secondary | ICD-10-CM

## 2021-10-07 DIAGNOSIS — R1310 Dysphagia, unspecified: Secondary | ICD-10-CM

## 2021-10-07 DIAGNOSIS — R451 Restlessness and agitation: Secondary | ICD-10-CM

## 2021-10-07 DIAGNOSIS — Z515 Encounter for palliative care: Secondary | ICD-10-CM

## 2021-10-07 NOTE — Progress Notes (Signed)
? ? ?[facility] ?[facility] Pall[facility] Consult Note ?Telephone: [phone]  ?Fax: [phone] ? ?PATIENT NAME: Jack Rivas ?[address] ?[place] [address] ?[phone] (home)  ?DOB: 1945-11-13 ?MRN: [id number] ? ?PRIMARY CARE PROVIDER:    ?Dr. Garwin Brothers ? ?REFERRING PROVIDER:   ?Dr. Garwin Brothers ? ?RESPONSIBLE PARTY:   Jack Rivas ?Contact Information   ? ? Name Relation Home Work Mobile  ? Paulina Fusi Daughter   [phone]  ? ?  ? ? ?I met face to face with patient at facility. Palliative Care was asked to follow this patient by consultation request of Dr. Garwin Brothers to address advance care planning, complex medical decision making and goals of care clarification.  ? ?  ASSESSMENT AND / RECOMMENDATIONS:  ? ?CODE STATUS: Patient is a full code ? ?Goals of Care: Goals include to maximize quality of life and symptom management ? ?Symptom Management/Plan: ?Dementia: Advanced, memory loss/confusion, incontinent of bowel and bladder, FAST 7A.  Safety/fall precautions.  Encourage participation in facility activities.  Continue ongoing supportive care. ?Agitation: Likely related to worsening dementia.  Continue quetiapine as ordered.  Approach redirection. ?Dysphagia: NPO.  Continue ongoing enteral PEG tube feeding. Elevate HOB 30 to 45 degrees at all times during feeding and for at least 1 hour after the feeding is stopped.  SLP following. ?Follow up: Palliative care will continue to follow for complex medical decision making, advance care planning, and clarification of goals. Return 6 weeks or prn.Encouraged to call provider sooner with any concerns.  ? ?Family /Caregiver/Community Supports: Patient in SNF for ongoing care ? ?HOSPICE ELIGIBILITY/DIAGNOSIS: TBD ? ?Chief Complaint: Follow up visit ? ?HISTORY OF PRESENT ILLNESS:  Jack Rivas is a 76 y.o. year old male  with multiple medical conditions including dementia, advanced with associated memory loss/confusion, agitation in line with  dementia disease trajectory.  History of  dysphagia, hypertension, pneumonitis due to inhalation of food and vomit.  Patient with limited communication during visit, no signs of distress-FLACC 0. ?History obtained from review of EMR, discussion with primary team, caregiver, family and/or Jack Rivas.  ?Review and summarization of Epic records shows history from other than patient. Rest of 10 point ROS asked and negative. ?   ?Physical Exam: ?Constitutional: NAD ?General: Well groomed, cooperative, FLACC 0 ?EYES: anicteric sclera, lids intact, no discharge  ?ENMT: Moist mucous membrane ?CV: S1 S2, RRR, no LE edema ?Pulmonary: no increased work of breathing, no cough, ?Abdomen: active BS + 4 quadrants, soft and non tender; PEG tube in place ?GU: no suprapubic tenderness ?MSK: weakness, sarcopenia, limited ROM ?Skin: warm and dry, no rashes or wounds on visible skin ?Neuro:  weakness, otherwise non focal.  Memory loss/confusion ?Psych: non-anxious affect ?Hem/lymph/immuno: no widespread bruising ? ? ?PAST MEDICAL HISTORY:  ?Active Ambulatory Problems  ?  Diagnosis Date Noted  ? Aspiration pneumonia ([facility]) 03/20/2021  ? Dementia ([facility]) 03/20/2021  ? Dysphagia 03/20/2021  ? Malnutrition of moderate degree 03/21/2021  ? HCAP (healthcare-associated pneumonia) 04/20/2021  ? CKD (chronic kidney disease), stage III ([facility]) 04/20/2021  ? Bradycardia 04/20/2021  ? Severe sepsis ([facility]) 04/20/2021  ? ?Resolved Ambulatory Problems  ?  Diagnosis Date Noted  ? Acute metabolic encephalopathy 16/38/4536  ? Pneumonia 05/10/2021  ? AMS (altered mental status) 05/10/2021  ? ?Past Medical History:  ?Diagnosis Date  ? Hypertension   ? Uses feeding tube   ? ? ?SOCIAL HX:  ?Social History  ? ?Tobacco Use  ? Smoking status: Light Smoker  ? Smokeless tobacco: Never  ?  Substance Use Topics  ? Alcohol use: Yes  ? ? ? ?ALLERGIES: No Known Allergies   ? ?PERTINENT MEDICATIONS:  ?Outpatient Encounter Medications as of 10/07/2021  ?Medication Sig  ?  acetaminophen (TYLENOL) 160 MG/5ML solution Place 10.2 mLs (325 mg total) into feeding tube every 6 (six) hours as needed for mild pain, headache or fever.  ? famotidine (PEPCID) 20 MG tablet Place 20 mg into feeding tube at bedtime.  ? Nutritional Supplements (FEEDING SUPPLEMENT, JEVITY 1.5 CAL/FIBER,) LIQD Place 1,000 mLs into feeding tube continuous.  ? Nutritional Supplements (FEEDING SUPPLEMENT, PROSOURCE TF,) liquid Place 45 mLs into feeding tube 2 (two) times daily.  ? polyethylene glycol powder (GLYCOLAX/MIRALAX) 17 GM/SCOOP powder Place 17 g into feeding tube See admin instructions. Mix 17 grams of powder as directed and give, per G-Tube, once a day  ? QUEtiapine (SEROQUEL) 25 MG tablet Place 1 tablet (25 mg total) into feeding tube at bedtime.  ? Water For Irrigation, Sterile (FREE WATER) SOLN Place 200 mLs into feeding tube every 4 (four) hours.  ? ?No facility-administered encounter medications on file as of 10/07/2021.  ?I spent 45 minutes providing this consultation; this includes time spent with patient/family, chart review and documentation. More than 50% of the time in this consultation was spent on counseling and coordinating communication  ?Thank you for the opportunity to participate in the care of Jack Rivas.  The palliative care team will continue to follow. Please call our office at [phone] if we can be of additional assistance.  ? ?Note: Portions of this note were generated with Lobbyist. Dictation errors may occur despite best attempts at proofreading. ? ?Teodoro Spray, NP  ? ?  ?

## 2021-10-23 ENCOUNTER — Inpatient Hospital Stay (HOSPITAL_COMMUNITY)
Admission: EM | Admit: 2021-10-23 | Discharge: 2021-10-28 | DRG: 177 | Disposition: A | Discharge status: Skilled Nursing Facility | Payer: Medicare Other | Source: Skilled Nursing Facility | Attending: Family Medicine | Admitting: Family Medicine

## 2021-10-23 ENCOUNTER — Other Ambulatory Visit: Payer: Self-pay

## 2021-10-23 ENCOUNTER — Encounter (HOSPITAL_COMMUNITY): Payer: Self-pay | Admitting: Emergency Medicine

## 2021-10-23 ENCOUNTER — Emergency Department (HOSPITAL_COMMUNITY): Payer: Medicare Other

## 2021-10-23 DIAGNOSIS — E162 Hypoglycemia, unspecified: Secondary | ICD-10-CM

## 2021-10-23 DIAGNOSIS — N1831 Chronic kidney disease, stage 3a: Secondary | ICD-10-CM | POA: Diagnosis present

## 2021-10-23 DIAGNOSIS — E440 Moderate protein-calorie malnutrition: Secondary | ICD-10-CM | POA: Diagnosis present

## 2021-10-23 DIAGNOSIS — F0390 Unspecified dementia without behavioral disturbance: Secondary | ICD-10-CM | POA: Diagnosis present

## 2021-10-23 DIAGNOSIS — F17200 Nicotine dependence, unspecified, uncomplicated: Secondary | ICD-10-CM | POA: Diagnosis present

## 2021-10-23 DIAGNOSIS — N179 Acute kidney failure, unspecified: Secondary | ICD-10-CM | POA: Diagnosis present

## 2021-10-23 DIAGNOSIS — N189 Chronic kidney disease, unspecified: Secondary | ICD-10-CM | POA: Diagnosis present

## 2021-10-23 DIAGNOSIS — Z20822 Contact with and (suspected) exposure to covid-19: Secondary | ICD-10-CM | POA: Diagnosis present

## 2021-10-23 DIAGNOSIS — N1832 Chronic kidney disease, stage 3b: Secondary | ICD-10-CM | POA: Diagnosis present

## 2021-10-23 DIAGNOSIS — D631 Anemia in chronic kidney disease: Secondary | ICD-10-CM | POA: Diagnosis present

## 2021-10-23 DIAGNOSIS — Z6825 Body mass index (BMI) 25.0-25.9, adult: Secondary | ICD-10-CM | POA: Diagnosis not present

## 2021-10-23 DIAGNOSIS — Z9181 History of falling: Secondary | ICD-10-CM

## 2021-10-23 DIAGNOSIS — R680 Hypothermia, not associated with low environmental temperature: Secondary | ICD-10-CM | POA: Diagnosis present

## 2021-10-23 DIAGNOSIS — R1310 Dysphagia, unspecified: Secondary | ICD-10-CM

## 2021-10-23 DIAGNOSIS — R1312 Dysphagia, oropharyngeal phase: Secondary | ICD-10-CM | POA: Diagnosis present

## 2021-10-23 DIAGNOSIS — G9341 Metabolic encephalopathy: Secondary | ICD-10-CM

## 2021-10-23 DIAGNOSIS — Z79899 Other long term (current) drug therapy: Secondary | ICD-10-CM | POA: Diagnosis not present

## 2021-10-23 DIAGNOSIS — T68XXXA Hypothermia, initial encounter: Secondary | ICD-10-CM

## 2021-10-23 DIAGNOSIS — Z931 Gastrostomy status: Secondary | ICD-10-CM | POA: Diagnosis not present

## 2021-10-23 DIAGNOSIS — I440 Atrioventricular block, first degree: Secondary | ICD-10-CM | POA: Diagnosis present

## 2021-10-23 DIAGNOSIS — R001 Bradycardia, unspecified: Secondary | ICD-10-CM | POA: Diagnosis present

## 2021-10-23 DIAGNOSIS — Z515 Encounter for palliative care: Secondary | ICD-10-CM | POA: Diagnosis not present

## 2021-10-23 DIAGNOSIS — D638 Anemia in other chronic diseases classified elsewhere: Secondary | ICD-10-CM | POA: Diagnosis not present

## 2021-10-23 DIAGNOSIS — I129 Hypertensive chronic kidney disease with stage 1 through stage 4 chronic kidney disease, or unspecified chronic kidney disease: Secondary | ICD-10-CM | POA: Diagnosis present

## 2021-10-23 DIAGNOSIS — J690 Pneumonitis due to inhalation of food and vomit: Principal | ICD-10-CM | POA: Diagnosis present

## 2021-10-23 DIAGNOSIS — Z8616 Personal history of COVID-19: Secondary | ICD-10-CM | POA: Diagnosis not present

## 2021-10-23 DIAGNOSIS — R5381 Other malaise: Secondary | ICD-10-CM

## 2021-10-23 LAB — CBC WITH DIFFERENTIAL/PLATELET
Abs Immature Granulocytes: 0.02 10*3/uL (ref 0.00–0.07)
Basophils Absolute: 0 10*3/uL (ref 0.0–0.1)
Basophils Relative: 0 %
Eosinophils Absolute: 0.2 10*3/uL (ref 0.0–0.5)
Eosinophils Relative: 2 %
HCT: 32.5 % — ABNORMAL LOW (ref 39.0–52.0)
Hemoglobin: 10.5 g/dL — ABNORMAL LOW (ref 13.0–17.0)
Immature Granulocytes: 0 %
Lymphocytes Relative: 16 %
Lymphs Abs: 1.4 10*3/uL (ref 0.7–4.0)
MCH: 30.3 pg (ref 26.0–34.0)
MCHC: 32.3 g/dL (ref 30.0–36.0)
MCV: 93.7 fL (ref 80.0–100.0)
Monocytes Absolute: 0.6 10*3/uL (ref 0.1–1.0)
Monocytes Relative: 7 %
Neutro Abs: 6.1 10*3/uL (ref 1.7–7.7)
Neutrophils Relative %: 75 %
Platelets: 167 10*3/uL (ref 150–400)
RBC: 3.47 MIL/uL — ABNORMAL LOW (ref 4.22–5.81)
RDW: 13 % (ref 11.5–15.5)
WBC: 8.3 10*3/uL (ref 4.0–10.5)
nRBC: 0 % (ref 0.0–0.2)

## 2021-10-23 LAB — BASIC METABOLIC PANEL
Anion gap: 8 (ref 5–15)
BUN: 39 mg/dL — ABNORMAL HIGH (ref 8–23)
CO2: 27 mmol/L (ref 22–32)
Calcium: 9.2 mg/dL (ref 8.9–10.3)
Chloride: 101 mmol/L (ref 98–111)
Creatinine, Ser: 1.3 mg/dL — ABNORMAL HIGH (ref 0.61–1.24)
GFR, Estimated: 57 mL/min — ABNORMAL LOW (ref >60)
Glucose, Bld: 84 mg/dL (ref 70–99)
Potassium: 4.9 mmol/L (ref 3.5–5.1)
Sodium: 136 mmol/L (ref 135–145)

## 2021-10-23 MED ORDER — AMOXICILLIN-POT CLAVULANATE 250-62.5 MG/5ML PO SUSR
875.0000 mg | Freq: Two times a day (BID) | ORAL | Status: DC
  Filled 2021-10-23: qty 10.9

## 2021-10-23 MED ORDER — SODIUM CHLORIDE 0.9 % IV SOLN
3.0000 g | Freq: Four times a day (QID) | INTRAVENOUS | Status: DC
  Administered 2021-10-24 – 2021-10-28 (×19): 3 g via INTRAVENOUS
  Filled 2021-10-23 (×20): qty 8

## 2021-10-23 MED ORDER — GLUCAGON HCL RDNA (DIAGNOSTIC) 1 MG IJ SOLR
1.0000 mg | Freq: Once | INTRAMUSCULAR | Status: AC
  Administered 2021-10-23: 1 mg via INTRAVENOUS
  Filled 2021-10-23: qty 1

## 2021-10-23 MED ORDER — ACETAMINOPHEN 325 MG PO TABS: 650.0000 mg | ORAL_TABLET | Freq: Four times a day (QID) | ORAL | Status: DC | PRN

## 2021-10-23 MED ORDER — SODIUM CHLORIDE 0.9 % IV SOLN
3.0000 g | Freq: Once | INTRAVENOUS | Status: AC
  Administered 2021-10-23: 3 g via INTRAVENOUS
  Filled 2021-10-23: qty 8

## 2021-10-23 MED ORDER — ACETAMINOPHEN 650 MG RE SUPP: 650.0000 mg | Freq: Four times a day (QID) | RECTAL | Status: DC | PRN

## 2021-10-23 MED ORDER — LORAZEPAM 2 MG/ML IJ SOLN
0.5000 mg | Freq: Once | INTRAMUSCULAR | Status: AC
  Administered 2021-10-23: 0.5 mg via INTRAVENOUS
  Filled 2021-10-23: qty 1

## 2021-10-23 MED ORDER — AMOXICILLIN-POT CLAVULANATE 400-57 MG/5ML PO SUSR: 875.0000 mg | Freq: Two times a day (BID) | ORAL | Status: DC

## 2021-10-23 NOTE — ED Notes (Signed)
Pt has present productive cough. Unable to cough up secretions. Requiring continued oral suctioning. Difficulty as pt bites down on younker.  ?

## 2021-10-23 NOTE — Assessment & Plan Note (Addendum)
Chronic. History of PEG tube on tube feeds and free water. Continue tube feeds. Speech therapy evaluated and recommend change in oral diet: ? ?1. Nectar thick liquid;Dysphagia 1 (Puree) solids;Thin liquid (tsps of thin ok) ?2. Liquid Administration via: Cup;No straw;Spoon (NO STRAW) ?3. Medication Administration: Crushed with puree ?

## 2021-10-23 NOTE — ED Notes (Signed)
Pt attempting to exit bed, bed alarm in place, pt tilted back. Door open in direct sight of nurses station.  ?

## 2021-10-23 NOTE — ED Triage Notes (Addendum)
Pt BIB [facility] from [facility]. He's NPO at the facility. Per EMS, they suspected that family came in and fed him today and facility is concerned for aspiration. Wet-sounding cough noted.  ?

## 2021-10-23 NOTE — ED Notes (Signed)
Attempted PO challenge with pt per MD Trifan request. Family at bedside states pt typically able to swallow liquids. On administration of PO water, pt unable to swallow and started gurgling requiring suction. EDP informed failed swallow screen via secure chat.  ?

## 2021-10-23 NOTE — Assessment & Plan Note (Addendum)
Hemoglobin stable 

## 2021-10-23 NOTE — H&P (Signed)
?History and Physical  ? ? ?PLEASE NOTE THAT DRAGON DICTATION SOFTWARE WAS USED IN THE CONSTRUCTION OF THIS NOTE. ? ? Jack Rivas [id number] DOB: 1946/02/11 DOA: 10/23/2021 ? ?PCP: Patient, No Pcp Per (Inactive) (will further assess) ?Patient coming from: SNF ? ?I have personally briefly reviewed patient's old medical records in [facility] ? ?Chief Complaint: aspiration event ? ?HPI: Jack Rivas is a 76 y.o. male with medical history significant for chronic dysphagia status post PEG tube, advanced dementia, reportedly nonverbal at baseline, chronic anemia with baseline hemoglobin 8.5-10, who is admitted to [facility] on 10/23/2021 with suspected aspiration pneumonia after presenting from SNF to [facility] ED for evaluation of aspiration event.  ? ?In the setting of the patient's advanced dementia, following history is provided by the patient's family as well as my discussions with the EDP and via chart review. ? ?Patient has a history of chronic dysphagia status post PEG tube.  Per family, he is able to drink some water by mouth, but otherwise receives all of his fluids and nutrition via the PEG tube.  It appears that he is on continuous tube feeds via the PEG via Jevity as well as on free water flushes.  Per family, the patient is nonverbal at baseline. ? ?In spite of the patient taking only water by mouth, he was visited by his family at SNF earlier today for Easter at which time family reportedly provided the patient with [facility] chicken.  In the process of attempting to consume the [facility] chicken by mouth, the patient reportedly gagged, started coughing, and demonstrated some evidence of gurgling at that time, with associated cough producing aforementioned chicken.  This context, the patient was subsequent brought to [facility] emergency department for further evaluation management of suspected aspiration event.  Patient's subsequently exhibited evidence of cough.  No known baseline supplemental oxygen  requirements.  Pulmonary pathology. ? ?Per chart review, baseline creatinine appears to be in the range of 1.0-1.2.  Additionally, the patient has a documented history of chronic anemia with baseline hemoglobin 8.5-10. ? ? ? ? ?ED Course:  ?Vital signs in the ED were notable for the following: Temperature max 97.5; heart rate 70-86; blood pressure 119/69 -133/92; respiratory rate 15-19, oxygen saturation 94 to 98% on room air. ? ?Labs were notable for the following: BMP notable for the following: Sodium 136, bicarbonate 27, creatinine 1.30, glucose 84.  CBC notable for white cell count 8300, hemoglobin 10.5. ? ?Imaging and additional notable ED work-up: Two-view chest x-ray shows developing patchy airspace opacity within the right lower lobe consistent with infection versus inflammation. ? ?While in the ED, the following were administered: Unasyn. ? ?Subsequently, the patient was admitted for further evaluation management of concern for aspiration pneumonia.  ? ? ?Review of Systems: As per HPI otherwise 10 point review of systems negative.  ? ?Past Medical History:  ?Diagnosis Date  ? Dementia ([place])   ? Hypertension   ? Uses feeding tube   ? ? ?Past Surgical History:  ?Procedure Laterality Date  ? CATARACT EXTRACTION, BILATERAL    ? KNEE SURGERY    ? ? ?Social History: ? reports that he has been smoking. He has never used smokeless tobacco. He reports current alcohol use. He reports that he does not use drugs. ? ? ?No Known Allergies ? ?History reviewed. No pertinent family history. ? ?Family history reviewed and not pertinent  ? ? ?Prior to Admission medications   ?Medication Sig Start Date End Date Taking? Authorizing  Provider  ?famotidine (PEPCID) 20 MG tablet Place 20 mg into feeding tube at bedtime. 03/15/21  Yes [provider]  ?Nutritional Supplements (FEEDING SUPPLEMENT, JEVITY 1.5 CAL/FIBER,) LIQD Place 1,000 mLs into feeding tube continuous. ?Patient taking differently: 1,000 mLs by Per J Tube  route continuous. In the Morning & Afternoon for Nutrition. Cal @ 70 ml/hr continuous hours X 14 hours/day (Start @ 1800, Stop @ 0800 via PEG (total 1000 ml formula X 24 hours) 04/26/21  Yes Dahal, Marlowe Aschoff, MD  ?polyethylene glycol powder (GLYCOLAX/MIRALAX) 17 GM/SCOOP powder Place 17 g into feeding tube See admin instructions. Mix 17 grams of powder as directed and give, per G-Tube, once a day 03/15/21  Yes [provider]  ?QUEtiapine (SEROQUEL) 25 MG tablet Place 1 tablet (25 mg total) into feeding tube at bedtime. 05/19/21  Yes Cherene Altes, MD  ?acetaminophen (TYLENOL) 160 MG/5ML solution Place 10.2 mLs (325 mg total) into feeding tube every 6 (six) hours as needed for mild pain, headache or fever. ?Patient not taking: Reported on 10/23/2021 05/19/21   Cherene Altes, MD  ?Nutritional Supplements (FEEDING SUPPLEMENT, PROSOURCE TF,) liquid Place 45 mLs into feeding tube 2 (two) times daily. ?Patient not taking: Reported on 10/23/2021 04/26/21   Terrilee Croak, MD  ?Water For Irrigation, Sterile (FREE WATER) SOLN Place 200 mLs into feeding tube every 4 (four) hours. 05/19/21   Cherene Altes, MD  ? ? ? ?Objective  ? ? ?Physical Exam: ?Vitals:  ? 10/23/21 2130 10/23/21 2200 10/23/21 2215 10/23/21 2230  ?BP: (!) 133/92 (!) 137/109  119/69  ?Pulse: (!) 38 (!) 45 (!) 35 (!) 41  ?Resp: 14   14  ?Temp:      ?TempSrc:      ?SpO2: 98% 95% 96% 94%  ? ? ?General: appears to be stated age; alert; non-verbal ?Skin: warm, dry, no rash ?Head:  AT/[place] ?Mouth:  Oral mucosa membranes appear dry, normal dentition ?Neck: supple; trachea midline ?Heart:  RRR; did not appreciate any M/R/G ?Lungs: RLL rates noted, but otherwise, CTAB, did not appreciate any  wheezes, or rhonchi ?Abdomen: + BS; soft, ND, NT; peg noted ?Vascular: 2+ pedal pulses b/l; 2+ radial pulses b/l ?Extremities: no peripheral edema, no muscle wasting ?Neuro: strength and sensation intact in upper and lower extremities b/l ? ? ? ? ?Labs on Admission: I  have personally reviewed following labs and imaging studies ? ?CBC: ?Recent Labs  ?Lab 10/23/21 ?2215  ?WBC 8.3  ?NEUTROABS 6.1  ?HGB 10.5*  ?HCT 32.5*  ?MCV 93.7  ?PLT 167  ? ?Basic Metabolic Panel: ?Recent Labs  ?Lab 10/23/21 ?2215  ?NA 136  ?K 4.9  ?CL 101  ?CO2 27  ?GLUCOSE 84  ?BUN 39*  ?CREATININE 1.30*  ?CALCIUM 9.2  ? ?GFR: ?CrCl cannot be calculated (Unknown ideal weight.). ?Liver Function Tests: ?No results for input(s): AST, ALT, ALKPHOS, BILITOT, PROT, ALBUMIN in the last 168 hours. ?No results for input(s): LIPASE, AMYLASE in the last 168 hours. ?No results for input(s): AMMONIA in the last 168 hours. ?Coagulation Profile: ?No results for input(s): INR, PROTIME in the last 168 hours. ?Cardiac Enzymes: ?No results for input(s): CKTOTAL, CKMB, CKMBINDEX, TROPONINI in the last 168 hours. ?BNP (last 3 results) ?No results for input(s): PROBNP in the last 8760 hours. ?HbA1C: ?No results for input(s): HGBA1C in the last 72 hours. ?CBG: ?No results for input(s): GLUCAP in the last 168 hours. ?Lipid Profile: ?No results for input(s): CHOL, HDL, LDLCALC, TRIG, CHOLHDL, LDLDIRECT in the  last 72 hours. ?Thyroid Function Tests: ?No results for input(s): TSH, T4TOTAL, FREET4, T3FREE, THYROIDAB in the last 72 hours. ?Anemia Panel: ?No results for input(s): VITAMINB12, FOLATE, FERRITIN, TIBC, IRON, RETICCTPCT in the last 72 hours. ?Urine analysis: ?   ?Component Value Date/Time  ? COLORURINE YELLOW 05/11/2021 0649  ? APPEARANCEUR CLEAR 05/11/2021 0649  ? LABSPEC 1.016 05/11/2021 0649  ? PHURINE 7.0 05/11/2021 0649  ? GLUCOSEU NEGATIVE 05/11/2021 0649  ? [place] NEGATIVE 05/11/2021 0649  ? [place] NEGATIVE 05/11/2021 0649  ? [place] NEGATIVE 05/11/2021 0649  ? PROTEINUR 30 (A) 05/11/2021 0649  ? NITRITE NEGATIVE 05/11/2021 0649  ? LEUKOCYTESUR NEGATIVE 05/11/2021 0649  ? ? ?Radiological Exams on Admission: ?DG Chest 2 View ? ?Result Date: 10/23/2021 ?CLINICAL DATA:  evaluate for aspiration PNA EXAM: CHEST - 2 VIEW.   Patient is slightly rotated on frontal view. COMPARISON:  Chest x-ray 05/10/2021 FINDINGS: The heart and mediastinal contours are unchanged. Aortic calcification. Developing patchy airspace opacity within the r

## 2021-10-23 NOTE — ED Provider Notes (Signed)
?[facility] DEPT ?Provider Note ? ? ?CSN: [id number] ?Arrival date & time: 10/23/21  2001 ? ?  ? ?History ? ?CC: Productive cough ? ? ?Jack Rivas is a 76 y.o. male presenting from [facility] with concern for an aspiration event.  The patient normally gets his feeds by G-tube.  Family came to visit today and there was some concern they attempted to feed the patient by mouth, and he may have aspirated.  He has a wet sounding coarse cough.  Patient is a poor historian cannot provide any further information. ? ?Patient's daughter is present at bedside reports a different family member, and in fact the patient had small amount of minutes [place] or chicken.  Patient normally can tolerate fluids by mouth and a pur?ed diet, but not solid food.  He has a G-tube. ? ?Patient is minimally verbal at baseline but does understand and interact appropriately. ? ?HPI ? ?  ? ?Home Medications ?Prior to Admission medications   ?Medication Sig Start Date End Date Taking? Authorizing Provider  ?acetaminophen (TYLENOL) 160 MG/5ML solution Place 10.2 mLs (325 mg total) into feeding tube every 6 (six) hours as needed for mild pain, headache or fever. 05/19/21   Cherene Altes, MD  ?famotidine (PEPCID) 20 MG tablet Place 20 mg into feeding tube at bedtime. 03/15/21   [provider]  ?Nutritional Supplements (FEEDING SUPPLEMENT, JEVITY 1.5 CAL/FIBER,) LIQD Place 1,000 mLs into feeding tube continuous. 04/26/21   Terrilee Croak, MD  ?Nutritional Supplements (FEEDING SUPPLEMENT, PROSOURCE TF,) liquid Place 45 mLs into feeding tube 2 (two) times daily. 04/26/21   Terrilee Croak, MD  ?polyethylene glycol powder (GLYCOLAX/MIRALAX) 17 GM/SCOOP powder Place 17 g into feeding tube See admin instructions. Mix 17 grams of powder as directed and give, per G-Tube, once a day 03/15/21   [provider]  ?QUEtiapine (SEROQUEL) 25 MG tablet Place 1 tablet (25 mg total) into feeding tube at bedtime. 05/19/21    Cherene Altes, MD  ?Water For Irrigation, Sterile (FREE WATER) SOLN Place 200 mLs into feeding tube every 4 (four) hours. 05/19/21   Cherene Altes, MD  ?   ? ?Allergies    ?Patient has no known allergies.   ? ?Review of Systems   ?Review of Systems ? ?Physical Exam ?Updated Vital Signs ?BP 119/69   Pulse (!) 41   Temp (!) 97.5 ?F (36.4 ?C) (Axillary)   Resp 14   SpO2 94%  ?Physical Exam ?Constitutional:   ?   General: He is not in acute distress. ?   Comments: Nonverbal, appears tired, eyes closed  ?HENT:  ?   Head: Normocephalic and atraumatic.  ?Eyes:  ?   Conjunctiva/sclera: Conjunctivae normal.  ?   Pupils: Pupils are equal, round, and reactive to light.  ?Cardiovascular:  ?   Rate and Rhythm: Normal rate and regular rhythm.  ?Pulmonary:  ?   Effort: Pulmonary effort is normal. No respiratory distress.  ?   Comments: Coarse breath sounds ?Abdominal:  ?   General: There is no distension.  ?   Tenderness: There is no abdominal tenderness.  ?Skin: ?   General: Skin is warm and dry.  ?Neurological:  ?   General: No focal deficit present.  ?   Mental Status: He is alert. Mental status is at baseline.  ? ? ?ED Results / Procedures / Treatments   ?Labs ?(all labs ordered are listed, but only abnormal results are displayed) ?Labs Reviewed  ?BASIC METABOLIC PANEL -  Abnormal; Notable for the following components:  ?    Result Value  ? BUN 39 (*)   ? Creatinine, Ser 1.30 (*)   ? GFR, Estimated 57 (*)   ? All other components within normal limits  ?CBC WITH DIFFERENTIAL/PLATELET - Abnormal; Notable for the following components:  ? RBC 3.47 (*)   ? Hemoglobin 10.5 (*)   ? HCT 32.5 (*)   ? All other components within normal limits  ? ? ?EKG ?None ? ?Radiology ?DG Chest 2 View ? ?Result Date: 10/23/2021 ?CLINICAL DATA:  evaluate for aspiration PNA EXAM: CHEST - 2 VIEW.  Patient is slightly rotated on frontal view. COMPARISON:  Chest x-ray 05/10/2021 FINDINGS: The heart and mediastinal contours are unchanged. Aortic  calcification. Developing patchy airspace opacity within the right lower lobe. No pulmonary edema. No pleural effusion. No pneumothorax. No acute osseous abnormality.  Left humeral head surgical hardware. IMPRESSION: Developing patchy airspace opacity within the right lower lobe. Finding consistent with infection/inflammation. Followup PA and lateral chest X-ray is recommended in 3-4 weeks following therapy to ensure resolution. Electronically Signed   By: Iven Finn M.D.   On: 10/23/2021 20:56   ? ?Procedures ?Procedures  ? ? ?Medications Ordered in ED ?Medications  ?Ampicillin-Sulbactam (UNASYN) 3 g in sodium chloride 0.9 % 100 mL IVPB (3 g Intravenous New Bag/Given 10/23/21 2223)  ?glucagon (human recombinant) (GLUCAGEN) injection 1 mg (1 mg Intravenous Given 10/23/21 2221)  ?LORazepam (ATIVAN) injection 0.5 mg (0.5 mg Intravenous Given 10/23/21 2219)  ? ? ?ED Course/ Medical Decision Making/ A&P ?Clinical Course as of 10/23/21 2255  ?Nancy Fetter Oct 23, 2021  ?2254 Admitted to hospitalist. [MT]  ?  ?Clinical Course User Index ?[MT] Wyvonnia Dusky, MD  ? ?                        ?Medical Decision Making ?Amount and/or Complexity of Data Reviewed ?Labs: ordered. ?Radiology: ordered. ? ?Risk ?Prescription drug management. ?Decision regarding hospitalization. ? ? ?Patient is here with wet cough and coarse breath sounds in the setting of possible aspiration from oral intake.  He is not hypoxic and otherwise breathing quite comfortably on room air.  X-rays ordered and personally reviewed showing concern for right-sided infiltrate, this clinical setting would be consistent with aspiration pneumonia ? ?Supplemental history provided by his family at the bedside. ? ?Unfortunately the patient was unable to tolerate any sips of water.  He has very poor mental status and it is not clear if he understands what is going on, or if you simply somnolent, but his family reports that he is simply tired in the evening, and typically he  will wake up and eat and drink food (puree diet).  His daughter clarifies that the other family member had said the patient [employer] chicken, cut up, and mac n' cheese this evening, despite facility orders to only feed purree diet.  The family that is currently present at the bedside is quite upset about this, and will be speaking with the attending family member. ? ?It is still not clear to me whether this is simply an aspiration, or dysphagia (he has a history), or a food impaction, as the patient does have some gargling on exam.  I have ordered IV Ativan and glucagon as a muscle relaxer, IV Unasyn for aspiration pneumonia.  We will check basic labs.  Patient can be monitored overnight, if not tolerating sips in the AM, inpatient team could consider GI consult. ? ?* ? ? ?  Labs reviewed - no significant abnormalities noted, mild elevation of BUN/Cr. ? ? ? ? ? ? ? ?Final Clinical Impression(s) / ED Diagnoses ?Final diagnoses:  ?Aspiration pneumonia of right lung due to regurgitated food, unspecified part of lung ([place])  ?Dysphagia, unspecified type  ? ? ?Rx / DC Orders ?ED Discharge Orders   ? ? None  ? ?  ? ? ?  ?Wyvonnia Dusky, MD ?10/23/21 2255 ? ?

## 2021-10-23 NOTE — Progress Notes (Signed)
Pharmacy Antibiotic Note ? ?Jack Rivas is a 76 y.o. male admitted on 10/23/2021 with concern for an aspiration event.  The patient normally gets his feeds by G-tube.  Family came to visit today and there was some concern they attempted to feed the patient by mouth, and he may have aspirated.Marland Kitchen  Pharmacy has been consulted for unasyn dosing. ? ?Plan: ?Unasyn 3gm IV q6h ?Follow renal function and clinical course ? ?  ? ?Temp (24hrs), Avg:97.5 ?F (36.4 ?C), Min:97.5 ?F (36.4 ?C), Max:97.5 ?F (36.4 ?C) ? ?Recent Labs  ?Lab 10/23/21 ?2215  ?WBC 8.3  ?CREATININE 1.30*  ?  ?CrCl cannot be calculated (Unknown ideal weight.).   ? ?No Known Allergies ? ? ?Thank you for allowing pharmacy to be a part of this patient?s care. ?Arley Phenix RPh ?10/23/2021, 11:29 PM ? ?

## 2021-10-23 NOTE — Assessment & Plan Note (Addendum)
Aspiration event while at his facility. In setting of oral intake in addition to tube feeding. CT soft tissue of neck and CT chest significant for possible wall edema/retained secretions in addition to right middle/bilateral lower lobe infiltrate. Patient started on Unasyn IV for management of pneumonia. Speech therapy consulted and performed a modified barium swallow study on 4/13. Patient completed 4.5 days of antibiotics inpatient. Discharge with one dose of Augmentin to complete 5 day course. Diet as mentioned in problem, Dysphagia. ?

## 2021-10-24 ENCOUNTER — Inpatient Hospital Stay (HOSPITAL_COMMUNITY): Payer: Medicare Other

## 2021-10-24 DIAGNOSIS — J690 Pneumonitis due to inhalation of food and vomit: Secondary | ICD-10-CM | POA: Diagnosis not present

## 2021-10-24 LAB — CBC WITH DIFFERENTIAL/PLATELET
Abs Immature Granulocytes: 0.04 10*3/uL (ref 0.00–0.07)
Basophils Absolute: 0 10*3/uL (ref 0.0–0.1)
Basophils Relative: 0 %
Eosinophils Absolute: 0.2 10*3/uL (ref 0.0–0.5)
Eosinophils Relative: 2 %
HCT: 30.4 % — ABNORMAL LOW (ref 39.0–52.0)
Hemoglobin: 9.7 g/dL — ABNORMAL LOW (ref 13.0–17.0)
Immature Granulocytes: 0 %
Lymphocytes Relative: 16 %
Lymphs Abs: 1.6 10*3/uL (ref 0.7–4.0)
MCH: 29.7 pg (ref 26.0–34.0)
MCHC: 31.9 g/dL (ref 30.0–36.0)
MCV: 93 fL (ref 80.0–100.0)
Monocytes Absolute: 0.5 10*3/uL (ref 0.1–1.0)
Monocytes Relative: 5 %
Neutro Abs: 7.3 10*3/uL (ref 1.7–7.7)
Neutrophils Relative %: 77 %
Platelets: 160 10*3/uL (ref 150–400)
RBC: 3.27 MIL/uL — ABNORMAL LOW (ref 4.22–5.81)
RDW: 12.8 % (ref 11.5–15.5)
WBC: 9.6 10*3/uL (ref 4.0–10.5)
nRBC: 0 % (ref 0.0–0.2)

## 2021-10-24 LAB — COMPREHENSIVE METABOLIC PANEL
ALT: 44 U/L (ref 0–44)
AST: 20 U/L (ref 15–41)
Albumin: 3.2 g/dL — ABNORMAL LOW (ref 3.5–5.0)
Alkaline Phosphatase: 76 U/L (ref 38–126)
Anion gap: 9 (ref 5–15)
BUN: 35 mg/dL — ABNORMAL HIGH (ref 8–23)
CO2: 27 mmol/L (ref 22–32)
Calcium: 9.3 mg/dL (ref 8.9–10.3)
Chloride: 103 mmol/L (ref 98–111)
Creatinine, Ser: 1.3 mg/dL — ABNORMAL HIGH (ref 0.61–1.24)
GFR, Estimated: 57 mL/min — ABNORMAL LOW (ref >60)
Glucose, Bld: 73 mg/dL (ref 70–99)
Potassium: 4.2 mmol/L (ref 3.5–5.1)
Sodium: 139 mmol/L (ref 135–145)
Total Bilirubin: 0.5 mg/dL (ref 0.3–1.2)
Total Protein: 7.4 g/dL (ref 6.5–8.1)

## 2021-10-24 LAB — GLUCOSE, CAPILLARY
Glucose-Capillary: 112 mg/dL — ABNORMAL HIGH (ref 70–99)
Glucose-Capillary: 56 mg/dL — ABNORMAL LOW (ref 70–99)
Glucose-Capillary: 62 mg/dL — ABNORMAL LOW (ref 70–99)
Glucose-Capillary: 63 mg/dL — ABNORMAL LOW (ref 70–99)
Glucose-Capillary: 69 mg/dL — ABNORMAL LOW (ref 70–99)
Glucose-Capillary: 71 mg/dL (ref 70–99)
Glucose-Capillary: 80 mg/dL (ref 70–99)
Glucose-Capillary: 93 mg/dL (ref 70–99)

## 2021-10-24 LAB — HEMOGLOBIN A1C
Hgb A1c MFr Bld: 5.6 % (ref 4.8–5.6)
Mean Plasma Glucose: 114.02 mg/dL

## 2021-10-24 LAB — FOLATE: Folate: 18.2 ng/mL (ref >5.9)

## 2021-10-24 LAB — MAGNESIUM: Magnesium: 2 mg/dL (ref 1.7–2.4)

## 2021-10-24 LAB — TSH: TSH: 3.3 u[IU]/mL (ref 0.350–4.500)

## 2021-10-24 MED ORDER — DEXTROSE 50 % IV SOLN
12.5000 g | INTRAVENOUS | Status: AC
  Administered 2021-10-24: 12.5 g via INTRAVENOUS

## 2021-10-24 MED ORDER — DEXTROSE 50 % IV SOLN
INTRAVENOUS | Status: AC
  Filled 2021-10-24: qty 50

## 2021-10-24 MED ORDER — DEXTROSE 5 % IV SOLN
INTRAVENOUS | Status: DC
Start: 2021-10-24 — End: 2021-10-25

## 2021-10-24 MED ORDER — QUETIAPINE FUMARATE 25 MG PO TABS
25.0000 mg | ORAL_TABLET | Freq: Every day | ORAL | Status: DC
  Administered 2021-10-25 – 2021-10-28 (×4): 25 mg
  Filled 2021-10-24 (×5): qty 1

## 2021-10-24 MED ORDER — JEVITY 1.5 CAL/FIBER PO LIQD
1000.0000 mL | ORAL | Status: DC
  Administered 2021-10-25: 1000 mL
  Filled 2021-10-24 (×2): qty 1000

## 2021-10-24 MED ORDER — FAMOTIDINE 20 MG PO TABS
20.0000 mg | ORAL_TABLET | Freq: Every day | ORAL | Status: DC
  Administered 2021-10-25 – 2021-10-28 (×4): 20 mg
  Filled 2021-10-24 (×5): qty 1

## 2021-10-24 MED ORDER — LACTATED RINGERS IV SOLN: INTRAVENOUS | Status: DC

## 2021-10-24 MED ORDER — ATROPINE SULFATE 1 % OP SOLN
2.0000 [drp] | Freq: Three times a day (TID) | OPHTHALMIC | Status: DC | PRN
  Administered 2021-10-24: 2 [drp] via SUBLINGUAL
  Filled 2021-10-24: qty 2

## 2021-10-24 MED ORDER — OSMOLITE 1.2 CAL PO LIQD
1000.0000 mL | ORAL | Status: DC
  Filled 2021-10-24: qty 1000

## 2021-10-24 NOTE — Progress Notes (Signed)
NUTRITION NOTE ? ?Consult received for initiation and management of tube feeding. Patient is NPO and has PEG from PTA. Patient moved from the ED to the floor a short time ago.  ? ?Full assessment to follow on 4/11 and will adjust TF regimen at that time, but will initiate Jevity 1.5 @ 25 ml/hr at this time until full assessment able to be completed.  ? ? ? ? ?Trenton Gammon, MS, RD, LDN ?Registered Dietitian II ?Inpatient Clinical Nutrition ?RD pager # and on-call/weekend pager # available in AMION  ? ?

## 2021-10-24 NOTE — TOC Initial Note (Signed)
Transition of Care (TOC) - Initial/Assessment Note  ? ? ?Patient Details  ?Name: Jack Rivas ?MRN: [id number] ?Date of Birth: 01/01/1946 ? ?Transition of Care (TOC) CM/SW Contact:    ?Tawanna Cooler, RN ?Phone Number: ?10/24/2021, 4:41 PM ? ?Clinical Narrative:                 ? ?Spoke with Claiborne Billings at [facility] center, who confirms patient is a long term resident there.  His Medicaid is currently pending, but he can return once he is medically stable for discharge.   ? ?Expected Discharge Plan: [place] ?Barriers to Discharge: Continued Medical Work up ? ?Expected Discharge Plan and Services ?Expected Discharge Plan: [place] ?  ?   ?Living arrangements for the past 2 months: [place] ?                ?  ?Prior Living Arrangements/Services ?Living arrangements for the past 2 months: [place] ?Lives with:: Facility Resident ?Patient language and need for interpreter reviewed:: Yes ?       ?Need for Family Participation in Patient Care: Yes (Comment) ?Care giver support system in place?: Yes (comment) ?  ?Criminal Activity/Legal Involvement Pertinent to Current Situation/Hospitalization: No - Comment as needed ? ?Activities of Daily Living ?Home Assistive Devices/Equipment: Blood pressure cuff, Feeding equipment, Grab bars around toilet, Grab bars in shower, Hand-held shower hose, Hospital bed, Scales, Wheelchair ?ADL Screening (condition at time of admission) ?Patient's cognitive ability adequate to safely complete daily activities?: No (patient has dementia) ?Is the patient deaf or have difficulty hearing?: No ?Does the patient have difficulty seeing, even when wearing glasses/contacts?: No ?Does the patient have difficulty concentrating, remembering, or making decisions?: Yes ?Patient able to express need for assistance with ADLs?: No ?Does the patient have difficulty dressing or bathing?: Yes (patient total assist) ?Independently performs  ADLs?: No (patient total assist) ?Communication: Independent ?Dressing (OT): Dependent ?Is this a change from baseline?: Pre-admission baseline ?Grooming: Dependent ?Is this a change from baseline?: Pre-admission baseline ?Feeding: Dependent ?Is this a change from baseline?: Pre-admission baseline ?Bathing: Dependent ?Is this a change from baseline?: Pre-admission baseline ?Toileting: Dependent ?Is this a change from baseline?: Pre-admission baseline ?In/Out Bed: Dependent ?Is this a change from baseline?: Pre-admission baseline ?Walks in Home: Dependent ?Is this a change from baseline?: Pre-admission baseline ?Does the patient have difficulty walking or climbing stairs?: Yes (patient total assist) ?Weakness of Legs: Both ?Weakness of Arms/Hands: Both ? ?Emotional Assessment ?   ?Alcohol / Substance Use: Not Applicable ?Psych Involvement: No (comment) ? ?Admission diagnosis:  Aspiration pneumonia ([place]) [J69.0] ?Aspiration pneumonia of right lung due to regurgitated food, unspecified part of lung ([place]) [J69.0] ?Dysphagia, unspecified type [R13.10] ?Patient Active Problem List  ? Diagnosis Date Noted  ? Anemia of chronic disease 10/23/2021  ? HCAP (healthcare-associated pneumonia) 04/20/2021  ? CKD (chronic kidney disease), stage III ([place]) 04/20/2021  ? Bradycardia 04/20/2021  ? Severe sepsis ([place]) 04/20/2021  ? Malnutrition of moderate degree 03/21/2021  ? Aspiration pneumonia ([place]) 03/20/2021  ? Dementia ([place]) 03/20/2021  ? Dysphagia 03/20/2021  ? ?PCP:  Patient, No Pcp Per (Inactive) ?Pharmacy:   ?[id number] - [place], [place] - [address] AT [place] ?[place] ?[place] [address] ?Phone: [phone] Fax: [phone] ? ? ? ?Readmission Risk Interventions ? ?  03/28/2021  ?  3:34 PM  ?Readmission Risk Prevention Plan  ?Transportation Screening Complete  ?PCP or Specialist Appt  within 3-5 Days Complete  ?[place] or Home Care Consult Complete  ?Social Work Consult  for [place] Planning/Counseling Complete  ?Palliative Care Screening Not Applicable  ?Medication Review Press photographer) Complete  ? ? ? ?

## 2021-10-24 NOTE — ED Notes (Signed)
Pt continues to cough unable to clear secretions. Attempt to suction has been made. Pt not following instructions in biting down on yaunker. Some secretions were able to be cleared. Pt remains HOB elevated 70-80 degrees.  ?

## 2021-10-24 NOTE — ED Notes (Signed)
Requested PRN SL atropine from main RX ?

## 2021-10-24 NOTE — Consult Note (Addendum)
CARDIOLOGY CONSULT NOTE  ?Patient ID: ?Jack Rivas ?MRN: [id number] ?DOB/AGE: 1946/03/13 76 y.o. ? ?Admit date: 10/23/2021 ?Attending physician: Antonieta Pert, MD ?Primary Physician:  Patient, No Pcp Per (Inactive) ?Outpatient Cardiologist: NA ?Inpatient Cardiologist: Rex Kras, DO, [facility] ? ?Reason of consultation: Bradycardia ?Referring physician: Antonieta Pert, MD ? ?Chief complaint: Aspiration ? ?HPI:  ?Jack Rivas is a 76 y.o. African-American male who presents with a chief complaint of " aspiration." His past medical history and cardiovascular risk factors include: Baseline cognitive impairment/dementia (per EMR), mildly reduced LVEF, grade 1 diastolic impairment, history of tracheostomy, status post G-tube, advanced age. ? ?Patient is nonverbal, withdraws to noxious stimuli, contracted extremities, cool to touch, dry skin, appears older than stated age. ? ?No family present at bedside, spoke to his daughter Janan Ridge over the phone provides collateral history.  At baseline patient is very minimally verbal but according to the daughter does understand and interacts appropriately.  He is currently at [facility] and was visited by his other daughter recently.  Patient consumes his diet through the feeding tube and able to tolerate fluids and pur?ed diet by mouth.  However, his other daughter was feeding him and started turkey/chicken and mashed potatoes from [facility] prior to arrival.  Subsequently patient started having episodes of coughing likely due to aspiration.  According to Jack Rivas they tried to aspirate as much as possible prior to bringing him to the hospital. ? ?Cardiology was consulted during his hospitalization for evaluation of bradycardia which was noted incidentally by support staff.  Patient is currently being treated for aspiration pneumonia.  He is noted to be hypothermic with a rectal temperature of 95 ?F.  He is maintaining a good blood pressure and respirations last documented to be 16  breaths/min. ? ?According to the [facility] patient does not carry any history of coronary artery disease, prior myocardial infarction, CHF, CVA/TIA, PE/DVT.  He has not seen cardiology to the best of her memory. ? ?In the recent past patient was not complaining of any chest pain, orthopnea, paroxysmal nocturnal dyspnea or lower extremity swelling. ? ?ALLERGIES: ?No Known Allergies ? ?PAST MEDICAL HISTORY: Unable to obtain due to his clinical status ?Past Medical History:  ?Diagnosis Date  ? Dementia ([place])   ? Hypertension   ? Uses feeding tube   ? ? ?PAST SURGICAL HISTORY: Unable to obtain due to his clinical status ?Past Surgical History:  ?Procedure Laterality Date  ? CATARACT EXTRACTION, BILATERAL    ? KNEE SURGERY    ? ? ?FAMILY HISTORY: ?Unable to obtain due to clinical status ?  ?SOCIAL HISTORY:.  Unable to update due to his clinical status ? ?MEDICATIONS: ?Current Outpatient Medications  ?Medication Instructions  ? acetaminophen (TYLENOL) 325 mg, Per Tube, Every 6 hours PRN  ? famotidine (PEPCID) 20 mg, Per Tube, Daily at bedtime  ? Nutritional Supplements (FEEDING SUPPLEMENT, JEVITY 1.5 CAL/FIBER,) LIQD 1,000 mLs, Per Tube, Continuous  ? Nutritional Supplements (FEEDING SUPPLEMENT, PROSOURCE TF,) liquid 45 mLs, Per Tube, 2 times daily  ? polyethylene glycol powder (GLYCOLAX/MIRALAX) 17 g, Per Tube, See admin instructions, Mix 17 grams of powder as directed and give, per G-Tube, once a day  ? QUEtiapine (SEROQUEL) 25 mg, Per Tube, Daily at bedtime  ? Water For Irrigation, Sterile (FREE WATER) SOLN 200 mLs, Per Tube, Every 4 hours  ? ? ?REVIEW OF SYSTEMS: ?Unable to perform ROS due to dementia, cognitive impairment,Acuity of situation ? ? ?PHYSICAL EXAM: ? ?  10/24/2021  ?  5:11 PM 10/24/2021  ?  3:42 PM 10/24/2021  ?  2:23 PM  ?Vitals with BMI  ?Systolic [id number]  ?Diastolic 67 69 75  ?Pulse 49 43 45  ? ? ? ?Intake/Output Summary (Last 24 hours) at 10/24/2021 1716 ?Last data filed at 10/24/2021 1609 ?Gross per 24  hour  ?Intake 103.54 ml  ?Output --  ?Net 103.54 ml  ?  ?Net IO Since Admission: 103.54 mL [10/24/21 1716] ? ?CONSTITUTIONAL: Appears older than stated age, frail, cool to touch, temporal wasting, dry skin, hemodynamically stable, no acute distress.   ?SKIN: Skin is cold and dry. No rash noted. No cyanosis. No pallor. No jaundice ?HEAD: Normocephalic and atraumatic.  ?EYES: No scleral icterus ?MOUTH/THROAT: Dry oral membranes.  ?NECK: No JVD present. No thyromegaly noted. No carotid bruits  ?CHEST Normal respiratory effort. No intercostal retractions  ?LUNGS: Clear to auscultation upper lung fields with decreased breath sounds at the bases. ?CARDIOVASCULAR: Regular, bradycardic, positive S1-S2, no murmurs rubs or gallops appreciated.  ?ABDOMINAL: Soft, nontender, nondistended, decreased breath sounds bilaterally, feeding tube present, no apparent ascites.  ?EXTREMITIES: Dry skin, poor nail hygiene, no pitting edema, cool to touch.  Contracted extremities ?HEMATOLOGIC: No significant bruising ?NEUROLOGIC: Nonverbal, withdraws to noxious stimuli, does not respond to verbal stimuli, unable to evaluate range of motion.   ? ?TELEMETRY: ?Sinus bradycardia, first-degree AV block, rare PVCs. ? ?RADIOLOGY: ?DG Chest 2 View ? ?Result Date: 10/23/2021 ?CLINICAL DATA:  evaluate for aspiration PNA EXAM: CHEST - 2 VIEW.  Patient is slightly rotated on frontal view. COMPARISON:  Chest x-ray 05/10/2021 FINDINGS: The heart and mediastinal contours are unchanged. Aortic calcification. Developing patchy airspace opacity within the right lower lobe. No pulmonary edema. No pleural effusion. No pneumothorax. No acute osseous abnormality.  Left humeral head surgical hardware. IMPRESSION: Developing patchy airspace opacity within the right lower lobe. Finding consistent with infection/inflammation. Followup PA and lateral chest X-ray is recommended in 3-4 weeks following therapy to ensure resolution. Electronically Signed   By: Iven Finn M.D.   On: 10/23/2021 20:56   ? ?LABORATORY DATA: ?Lab Results  ?Component Value Date  ? WBC 9.6 10/24/2021  ? HGB 9.7 (L) 10/24/2021  ? HCT 30.4 (L) 10/24/2021  ? MCV 93.0 10/24/2021  ? PLT 160 10/24/2021  ?  ?Recent Labs  ?Lab 10/24/21 ?[id number]  ?NA 139  ?K 4.2  ?CL 103  ?CO2 27  ?BUN 35*  ?CREATININE 1.30*  ?CALCIUM 9.3  ?PROT 7.4  ?BILITOT 0.5  ?ALKPHOS 76  ?ALT 44  ?AST 20  ?GLUCOSE 73  ? ? ?Lipid Panel  ?No results found for: CHOL, HDL, LDLCALC, LDLDIRECT, TRIG, CHOLHDL ? ?BNP (last 3 results) ?Recent Labs  ?  05/10/21 ?1219  ?BNP 53.6  ? ? ?HEMOGLOBIN A1C ?No results found for: HGBA1C, MPG ? ?Cardiac Panel (last 3 results) ?No results for input(s): CKTOTAL, CKMB, TROPONINIHS, RELINDX in the last 72 hours. ? ? ?TSH ?Recent Labs  ?  03/19/21 ?2224 05/12/21 ?0127  ?TSH 5.644* 4.334  ?  ? ?CARDIAC DATABASE: ?EKG: ?10/24/2021: Sinus bradycardia without underlying injury pattern, baseline artifact in multiple precordial leads. ? ?Echocardiogram: ?05/16/2021: LVEF [id number], grade 1 diastolic impairment, normal right ventricular size and function, small pericardial effusion present circumferentially, aortic root and proximal aorta mildly dilated 44 mm. ? ?IMPRESSION & RECOMMENDATIONS: ?Jack Rivas is a 76 y.o. African-American male whose past medical history and cardiovascular risk factors include: Baseline cognitive impairment/dementia (per EMR), mildly reduced LVEF, grade 1 diastolic impairment, history of tracheostomy, status post G-tube, advanced  age. ? ?Impression:  ?Sinus bradycardia. ?First-degree AV block. ?Premature ventricular contractions. ?Aspiration pneumonia. ?Hypothermia. ?Mildly reduced LVEF. ?Grade 1 diastolic impairment. ? ?Plan:  ?Cardiology was consulted during his hospitalization for evaluation of bradycardia.  No EKG was performed on arrival and most recent EKG notes sinus bradycardia.  I suspect that his underlying rhythm is influenced by the current hypothermia (rectal temperature 95 ?F),  aspiration pneumonia, and sleeping. ? ?Based on the history obtained by his daughter Jack Rivas no episodes of chest pain, heart failure symptoms, near-syncope or syncopal events. ? ?Based on the medication list available no AV no

## 2021-10-24 NOTE — Progress Notes (Signed)
1542 Patient rec'd with bradycardia. Per tele HR high 30's to low 40's. MD aware. Stat EKG performed stating junctional bradycardia HR 43. Rectal temp 95. Charge RN also aware. Red MEWS initiated. ?VS as follows- ? ? 10/24/21 1542  ?Vitals  ?Temp (!) 95 ?F (35 ?C)  ?Temp Source Rectal  ?BP (!) 147/69  ?BP Location Left Arm  ?BP Method Automatic  ?Patient Position (if appropriate) Lying  ?Pulse Rate (!) 43  ?Pulse Rate Source Other (Comment) ?(EKG)  ?Resp (!) 22  ?Level of Consciousness  ?Level of Consciousness Responds to Voice  ?MEWS COLOR  ?MEWS Score Color Red  ?Oxygen Therapy  ?SpO2 99 %  ?O2 Device Room Air  ?PAINAD (Pain Assessment in Advanced Dementia)  ?Breathing 0  ?Negative Vocalization 0  ?Facial Expression 0  ?Body Language 0  ?Consolability 0  ?PAINAD Score 0  ?MEWS Score  ?MEWS Temp 1  ?MEWS Systolic 0  ?MEWS Pulse 1  ?MEWS RR 1  ?MEWS LOC 1  ?MEWS Score 4  ?Provider Notification  ?Provider Name/Title [facility]  ?Date Provider Notified 10/24/21  ?Time Provider Notified 1537  ?Method of Notification Page  ?Notification Reason Other (Comment) ?(HR, temp)  ?Provider response No new orders;Other (Comment) ?(continue to monitor)  ?Date of Provider Response 10/24/21  ?Time of Provider Response 1549  ? ? ?

## 2021-10-24 NOTE — Progress Notes (Addendum)
PROGRESS NOTE Jack Rivas  WUX:[id number] DOB: [birth year] DOA: 10/23/2021 PCP: Patient, No Pcp Per (Inactive)   Brief Narrative/Hospital Course: 60 M w/ chronic dysphagia status post PEG tube on continuous tube feeds and free water flushes, advanced dementia, reportedly nonverbal at baseline, chronic anemia with baseline hemoglobin 8.5-10, sent from skilled nursing rehab with suspected aspiration after attempting to eat [employer] chicken. In the ED afebrile vital stable on room air, labs showing fairly stable creatinine at 1.3.  Chest x-ray with developing patchy airspace opacity within the right lower lobe consistent with infection/inflammation.  Patient placed on Unasyn and admitted for further management. As per daughter he had COVID last year and had trach/PEG. He has had falls. He is now at [facility] brought to [facility] about 9 months ago,he has been having hard time with eating and also having aspiration issues- he was supposed to be on " baby food" but his other daughter has been feeding him different food- breaking [employer] chicken into small pieces.   Subjective: Seen and examined this morning. He was resting comfortably on room air breathing well.  Did work-up for male and became mildly agitated does not follow commands, dysphagia and speech history, difficult to comprehend .  Nursing reports secretion issues stable since this am  Assessment and Plan: Principal Problem:   Aspiration pneumonia (HCC) Active Problems:   Dementia (HCC)   Dysphagia   Malnutrition of moderate degree   Anemia of chronic disease   Aspiration pneumonia hypothermia: After trying to eat and choking on Chicken Which Is Brought by Family Member Who Is Visiting Him in the Skilled Nursing Facility.  Continues to Be N.P.O.  Has chronic Dysphagia, Continue PEG Tube today.  Continue on Unasyn- he had a lot of secretion issues in the morning, doing relatively okay today, on RA.Temp on lower side, monitor. Keep on  ivf..  Dementia Agitation: At times agitated, continue supportive care fall precautions.  Seroquel  Dysphagia: Keep NPO.On Jevity tube feeding with water flushes at the facility and on ?puredd diet too. Malnutrition of moderate degree: Continue PEG tube feeding  Bradycardia, asymptomatic:hr at times in 30s, ekg jun rhythm in 40s. Discussed with Dr Odis Hollingshead who will see him.  Anemia of chronic disease: Hemoglobin baseline 8.5-0 g.  Monitor Recent Labs  Lab 10/23/21 2215 10/24/21 0547  HGB 10.5* 9.7*  HCT 32.5* 30.4*   GOC:  will do palliative care consult given his comorbid conditions. Overall probnosis is not bright.  I spoke to his daughter, he is full code.  DVT prophylaxis: SCDs Start: 10/23/21 2302 high risk for VTE start Lovenox Code Status:   Code Status: Full Code Family Communication: plan of care discussed with patient/RN at bedside. Patient status is: Inpatient level of care: Telemetry  Remains inpatient because: Ongoing management of aspiration pneumonia Patient currently not stable  Dispo: The patient is from: Skilled nursing facility            Anticipated disposition: Skilled nursing facility once S/P is tolerating and respiratory status is stable hopefully next 24 hours  Mobility Assessment (last 72 hours)     Mobility Assessment   No documentation.            Objective: Vitals last 24 hrs: Vitals:   10/24/21 0559 10/24/21 0600 10/24/21 0752 10/24/21 0900  BP:  126/74 119/65 108/76  Pulse: 72 72 60 75  Resp: 17 20 15 15   Temp:      TempSrc:      SpO2: 97% 97% 98%  97%   Weight change:   Physical Examination: General exam: Alert awake confused.  HEENT:Oral mucosa moist, Ear/Nose WNL grossly, dentition normal. Respiratory system: bilaterally diminished BS, no use of accessory muscle Cardiovascular system: S1 & S2 +, No JVD,. Gastrointestinal system: Abdomen soft, PEG present, NT,ND, BS+ Nervous System:Alert, awake, moving extremities and grossly  nonfocal Extremities: LE edema none,distal peripheral pulses palpable.  Skin: No rashes,no icterus. MSK: Normal muscle bulk,tone, power  Medications reviewed:  Scheduled Meds: Continuous Infusions:  ampicillin-sulbactam (UNASYN) IV 3 g (10/24/21 1020)     Diet Order             Diet NPO time specified  Diet effective now                  Intake/Output Summary (Last 24 hours) at 10/24/2021 1148 Last data filed at 10/24/2021 0404 Gross per 24 hour  Intake 100 ml  Output --  Net 100 ml   Net IO Since Admission: 100 mL [10/24/21 1148]  Wt Readings from Last 3 Encounters:  05/18/21 86.7 kg  04/26/21 86.3 kg  03/25/21 93.4 kg     Unresulted Labs (From admission, onward)     Start     Ordered   10/23/21 2315  Procalcitonin - Baseline  Add-on,   AD        10/23/21 2314   10/23/21 2303  Magnesium  Add-on,   AD       Question:  Specimen collection method  Answer:  Lab=Lab collect   10/23/21 2302          Data Reviewed: I have personally reviewed following labs and imaging studies CBC: Recent Labs  Lab 10/23/21 2215 10/24/21 0547  WBC 8.3 9.6  NEUTROABS 6.1 7.3  HGB 10.5* 9.7*  HCT 32.5* 30.4*  MCV 93.7 93.0  PLT 167 160   Basic Metabolic Panel: Recent Labs  Lab 10/23/21 2215 10/24/21 0547  NA 136 139  K 4.9 4.2  CL 101 103  CO2 27 27  GLUCOSE 84 73  BUN 39* 35*  CREATININE 1.30* 1.30*  CALCIUM 9.2 9.3  MG  --  2.0   GFR: CrCl cannot be calculated (Unknown ideal weight.). Liver Function Tests: Recent Labs  Lab 10/24/21 0547  AST 20  ALT 44  ALKPHOS 76  BILITOT 0.5  PROT 7.4  ALBUMIN 3.2*   No results for input(s): LIPASE, AMYLASE in the last 168 hours. No results for input(s): AMMONIA in the last 168 hours. Coagulation Profile: No results for input(s): INR, PROTIME in the last 168 hours. BNP (last 3 results) No results for input(s): PROBNP in the last 8760 hours. HbA1C: No results for input(s): HGBA1C in the last 72 hours. CBG: No  results for input(s): GLUCAP in the last 168 hours. Lipid Profile: No results for input(s): CHOL, HDL, LDLCALC, TRIG, CHOLHDL, LDLDIRECT in the last 72 hours. Thyroid Function Tests: No results for input(s): TSH, T4TOTAL, FREET4, T3FREE, THYROIDAB in the last 72 hours. Sepsis Labs: No results for input(s): PROCALCITON, LATICACIDVEN in the last 168 hours.  No results found for this or any previous visit (from the past 240 hour(s)).  Antimicrobials: Anti-infectives (From admission, onward)    Start     Dose/Rate Route Frequency Ordered Stop   10/24/21 0400  Ampicillin-Sulbactam (UNASYN) 3 g in sodium chloride 0.9 % 100 mL IVPB        3 g 200 mL/hr over 30 Minutes Intravenous Every 6 hours 10/23/21 2328  10/23/21 2200  amoxicillin-clavulanate (AUGMENTIN) 250-62.5 MG/5ML suspension 875 mg  Status:  Discontinued        875 mg Per Tube Every 12 hours 10/23/21 2112 10/23/21 2149   10/23/21 2200  Ampicillin-Sulbactam (UNASYN) 3 g in sodium chloride 0.9 % 100 mL IVPB        3 g 200 mL/hr over 30 Minutes Intravenous  Once 10/23/21 2150 10/23/21 2313   10/23/21 2115  amoxicillin-clavulanate (AUGMENTIN) 400-57 MG/5ML suspension 875 mg  Status:  Discontinued        875 mg Oral Every 12 hours 10/23/21 2112 10/23/21 2112      Culture/Microbiology    Component Value Date/Time   SDES  04/20/2021 1518    BLOOD BLOOD RIGHT FOREARM Performed at [facility], [address], [place], [place] [address]    SPECREQUEST  04/20/2021 1518    BOTTLES DRAWN AEROBIC AND ANAEROBIC Blood Culture adequate volume Performed at [facility], [address], [place], [place] [address]    CULT  04/20/2021 1518    NO GROWTH 5 DAYS Performed at [facility], [address] [place], [place] [address]    REPTSTATUS 04/25/2021 FINAL 04/20/2021 1518    Other culture-see note   Radiology Studies: DG Chest 2 View  Result Date: 10/23/2021 CLINICAL DATA:  evaluate for  aspiration PNA EXAM: CHEST - 2 VIEW.  Patient is slightly rotated on frontal view. COMPARISON:  Chest x-ray 05/10/2021 FINDINGS: The heart and mediastinal contours are unchanged. Aortic calcification. Developing patchy airspace opacity within the right lower lobe. No pulmonary edema. No pleural effusion. No pneumothorax. No acute osseous abnormality.  Left humeral head surgical hardware. IMPRESSION: Developing patchy airspace opacity within the right lower lobe. Finding consistent with infection/inflammation. Followup PA and lateral chest X-ray is recommended in 3-4 weeks following therapy to ensure resolution. Electronically Signed   By: Tish Frederickson M.D.   On: 10/23/2021 20:56     LOS: 1 day   Lanae Boast, MD [facility]  10/24/2021, 11:48 AM

## 2021-10-24 NOTE — Hospital Course (Addendum)
Jack Rivas is a 76 y.o. male with a history of chronic dysphagia s/p PEG tube on tube feeds and dementia. Patient presented after an aspiration event as his nursing facility with resultant aspiration pneumonia. Antibiotics initiated. Patient also noticed to have significant bradycardia, although asymptomatic. Cardiology consulted with recommendation for conservative management. Plan for discharge home with hospice. ?

## 2021-10-24 NOTE — ED Notes (Signed)
Pt continues to be unable to clear secretions. Attempted oral suctioning since arrival with minimal effectiveness since pt is unable to follow commands and bites down on the yaunker. Pts secretions have now worsen and is hypoxic. Pt originally 96% on room air, is now 88-90% on RA. Pt placed him on 2 L and re-attempted suctioning. SpO2 96% on 2 L oxygen. Pt remains HOB 80-90 degrees. RT called  and informed RT is unable to do nasal suctioning without an order. Secure chat sent to admitting provider Dr. Arlean Hopping informing him of event. Pending response.  ?

## 2021-10-25 DIAGNOSIS — N179 Acute kidney failure, unspecified: Secondary | ICD-10-CM

## 2021-10-25 DIAGNOSIS — J690 Pneumonitis due to inhalation of food and vomit: Secondary | ICD-10-CM | POA: Diagnosis not present

## 2021-10-25 DIAGNOSIS — N189 Chronic kidney disease, unspecified: Secondary | ICD-10-CM | POA: Diagnosis present

## 2021-10-25 DIAGNOSIS — E162 Hypoglycemia, unspecified: Secondary | ICD-10-CM

## 2021-10-25 DIAGNOSIS — T68XXXA Hypothermia, initial encounter: Secondary | ICD-10-CM

## 2021-10-25 DIAGNOSIS — R001 Bradycardia, unspecified: Secondary | ICD-10-CM

## 2021-10-25 DIAGNOSIS — R5381 Other malaise: Secondary | ICD-10-CM

## 2021-10-25 HISTORY — DX: Hypoglycemia, unspecified: E16.2

## 2021-10-25 LAB — RAPID URINE DRUG SCREEN, HOSP PERFORMED
Amphetamines: NOT DETECTED
Barbiturates: NOT DETECTED
Benzodiazepines: NOT DETECTED
Cocaine: NOT DETECTED
Opiates: NOT DETECTED
Tetrahydrocannabinol: NOT DETECTED

## 2021-10-25 LAB — CBC
HCT: 29.8 % — ABNORMAL LOW (ref 39.0–52.0)
Hemoglobin: 9.3 g/dL — ABNORMAL LOW (ref 13.0–17.0)
MCH: 29.6 pg (ref 26.0–34.0)
MCHC: 31.2 g/dL (ref 30.0–36.0)
MCV: 94.9 fL (ref 80.0–100.0)
Platelets: 146 10*3/uL — ABNORMAL LOW (ref 150–400)
RBC: 3.14 MIL/uL — ABNORMAL LOW (ref 4.22–5.81)
RDW: 13.1 % (ref 11.5–15.5)
WBC: 4.2 10*3/uL (ref 4.0–10.5)
nRBC: 0 % (ref 0.0–0.2)

## 2021-10-25 LAB — GLUCOSE, CAPILLARY
Glucose-Capillary: 107 mg/dL — ABNORMAL HIGH (ref 70–99)
Glucose-Capillary: 114 mg/dL — ABNORMAL HIGH (ref 70–99)
Glucose-Capillary: 124 mg/dL — ABNORMAL HIGH (ref 70–99)
Glucose-Capillary: 79 mg/dL (ref 70–99)
Glucose-Capillary: 98 mg/dL (ref 70–99)
Glucose-Capillary: 99 mg/dL (ref 70–99)
Glucose-Capillary: 99 mg/dL (ref 70–99)

## 2021-10-25 LAB — BASIC METABOLIC PANEL
Anion gap: 10 (ref 5–15)
BUN: 36 mg/dL — ABNORMAL HIGH (ref 8–23)
CO2: 26 mmol/L (ref 22–32)
Calcium: 9.2 mg/dL (ref 8.9–10.3)
Chloride: 103 mmol/L (ref 98–111)
Creatinine, Ser: 1.19 mg/dL (ref 0.61–1.24)
GFR, Estimated: >60 mL/min (ref >60)
Glucose, Bld: 78 mg/dL (ref 70–99)
Potassium: 3.9 mmol/L (ref 3.5–5.1)
Sodium: 139 mmol/L (ref 135–145)

## 2021-10-25 MED ORDER — JEVITY 1.5 CAL/FIBER PO LIQD
1000.0000 mL | ORAL | Status: DC
  Administered 2021-10-25 – 2021-10-26 (×4): 1000 mL
  Filled 2021-10-25 (×7): qty 1000

## 2021-10-25 MED ORDER — FREE WATER
200.0000 mL | Status: DC
  Administered 2021-10-25 – 2021-10-28 (×21): 200 mL

## 2021-10-25 MED ORDER — PROSOURCE TF PO LIQD
45.0000 mL | Freq: Every day | ORAL | Status: DC
  Administered 2021-10-25 – 2021-10-28 (×4): 45 mL
  Filled 2021-10-25 (×4): qty 45

## 2021-10-25 NOTE — Plan of Care (Signed)
?  Problem: Nutrition: ?Goal: Adequate nutrition will be maintained ?Outcome: Progressing ?  ?Problem: Coping: ?Goal: Level of anxiety will decrease ?Outcome: Progressing ?  ?Problem: Elimination: ?Goal: Will not experience complications related to bowel motility ?Outcome: Progressing ?Goal: Will not experience complications related to urinary retention ?Outcome: Progressing ?  ?Problem: Pain Managment: ?Goal: General experience of comfort will improve ?Outcome: Progressing ?  ?Problem: Safety: ?Goal: Ability to remain free from injury will improve ?Outcome: Progressing ?  ?

## 2021-10-25 NOTE — Progress Notes (Signed)
Pharmacy Antibiotic Note ? ?Jack Rivas is a 76 y.o. male admitted on 10/23/2021 with concern for an aspiration event.  The patient normally gets his feeds by G-tube.  Family came to visit today and there was some concern they attempted to feed the patient by mouth, and he may have aspirated.Marland Kitchen  Pharmacy has been consulted for unasyn dosing. ? ?Day #2 Unasyn ?- Hypothermic, Tmin 95 ?- WBC wnl ?- SCr improved, 1.19 ? ? ?Plan: ?Continue Unasyn 3gm IV q6h ?No dose adjustments needed, Pharmacy will sign off ? ?Weight: 94.1 kg (207 lb 7.3 oz) ? ?Temp (24hrs), Avg:96.8 ?F (36 ?C), Min:95 ?F (35 ?C), Max:97.6 ?F (36.4 ?C) ? ?Recent Labs  ?Lab 10/23/21 ?2215 10/24/21 ?3335 10/25/21 ?0357  ?WBC 8.3 9.6 4.2  ?CREATININE 1.30* 1.30* 1.19  ? ?  ?CrCl cannot be calculated (Unknown ideal weight.).   ? ?No Known Allergies ? ?Thank you for allowing pharmacy to be a part of this patient?s care. ? ?Loralee Pacas, PharmD, BCPS ?Pharmacy: [phone] ?10/25/2021, 8:01 AM ? ?

## 2021-10-25 NOTE — Evaluation (Signed)
SLP Cancellation Note ? ?Patient Details ?Name: Jack Rivas ?MRN: [id number] ?DOB: 1946/02/06 ? ? ?Cancelled treatment:       Reason Eval/Treat Not Completed: Other (comment);Fatigue/lethargy limiting ability to participate (pt currently lethargic; RN reports when he awakens, he is agitated; mild audible secretions suspected at larynx heard; RN reports pt needing NTS when suction needed due to poor tolerance of oral suctioning; requested RN contact SLP if pt awakens/calm) ? ? ?[name] ?10/25/2021, 10:16 AM ? ?Kathleen Lime, MS CCC SLP ?Acute Rehab Services ?Office [phone] ?Pager [phone] ? ? ?

## 2021-10-25 NOTE — Progress Notes (Signed)
?PROGRESS NOTE ?Jack Rivas  VUY:[id number] DOB: 07-18-1945 DOA: 10/23/2021 ?PCP: Patient, No Pcp Per (Inactive)  ? ?Brief Narrative/Hospital Course: ?62 M w/ chronic dysphagia status post PEG tube on continuous tube feeds and free water flushes, advanced dementia, reportedly nonverbal at baseline, chronic anemia with baseline hemoglobin 8.5-10, sent from skilled nursing rehab with suspected aspiration after attempting to eat [employer] chicken. ?In the ED afebrile vital stable on room air, labs showing fairly stable creatinine at 1.3.  Chest x-ray with developing patchy airspace opacity within the right lower lobe consistent with infection/inflammation.  Patient placed on Unasyn and admitted for further management. ?As per daughter he had COVID last year and had trach/PEG. He has had falls. He is now at [facility] brought to [facility] about 9 months ago,he has been having hard time with eating and also having aspiration issues- he was supposed to be on " baby food" but his other daughter has been feeding him different food- breaking [employer] chicken into small pieces. ?  ?Subjective: ?Seen and examined this morning. ?Nursing reports that whenever he awakens he gets agitated, has mild audible secretion likely from larynx. ?Speech unable to evaluate as he was lethargic earlier ?After I woke him up he was able to tell me his name as starling- voice mixed with sounds of secretion,able to follow commands move all his extremities. ?Overnight symptoms are stabilizing, BP stable heart rate in 40s to 60s, on room air ?Labs this morning with a stable renal function hemoglobin, plt at 146 ?On room air. ? ?Assessment and Plan: ?Principal Problem: ?  Aspiration pneumonia ([place]) ?Active Problems: ?  Acute metabolic encephalopathy ?  Dementia ([place]) ?  Dysphagia ?  Malnutrition of moderate degree ?  Anemia of chronic disease ?  Junctional bradycardia ?  Debility ?  Hypothermia ?  Hypoglycemia ?  ?Aspiration pneumonia ?Hypothermia ?Retained secretion and wall edema  at the level of supraglottic larynx, adjacent pharynx: ?Sent from NH after he aspirated [employer] chicken pieces.Has chronic Dysphagia although taking po and also on Tube feeding at [facility].  Underwent CT soft tissue neck and CT chest, patent airway, likely retained secretion as above and mild right middle lobe and bilateral lower lobe atelectasis and/or infiltrate. We will continue on Unasyn, respiratory care, aspiration precaution.secretion management, speech consulted.  Currently doing well on room air.   ? ?Acute metabolic encephalopathy ?Dementia ?Agitation: ?At times agitated.normal able to speak/talk as per the daughter.  Speech eval, supportive care cont his Seroquel.  Able to tell me his name and follows some commands. ? ?Dysphagia:  ?Speech to see the patient, resume tube feeding today  ? ?Malnutrition of moderate degree: resume PEG tube feeding ? ?Bradycardia, asymptomatic:hr at times in 30s, ekg jun rhythm in 40s. Discussed with Dr Terri Skains who is following closely appreciate input , TSH stable 3.3 MMA pending folate/urine drug screen. Continue to monitor on telemetry.  Likely worsened by his infection and low temperature ? ?Hypoglycemia:improved. On D5W hopefully we can resume tube feeding.  Monitor ?Recent Labs  ?Lab 10/24/21 ?1701 10/24/21 ?1715 10/24/21 ?1959 10/24/21 ?2354 10/25/21 ?0448 10/25/21 ?1683 10/25/21 ?0839  ?GLUCAP  --    < > 80 71 99 79 107*  ?HGBA1C 5.6  --   --   --   --   --   --   ? < > = values in this interval not displayed.  ? ?AKI mild bump in creatinine which is downtrending.on ivf ?Recent Labs  ?Lab 10/23/21 ?2215 10/24/21 ?7290 10/25/21 ?0357  ?  BUN 39* 35* 36*  ?CREATININE 1.30* 1.30* 1.19  ?  ?Anemia of chronic disease: ?Hemoglobin baseline 8.5-10 g.  Monitor ?Recent Labs  ?Lab 10/23/21 ?2215 10/24/21 ?6415 10/25/21 ?0357  ?HGB 10.5* 9.7* 9.3*  ?HCT 32.5* 30.4* 29.8*  ? ?Mild thrombocytopenia from infection.  Monitor ?Recent Labs  ?Lab 10/23/21 ?2215 10/24/21 ?8309 10/25/21 ?0357  ?PLT 167  160 146*  ?  ?Small pericardial effusion noted on the CT.  Monitor.  Asymptomatic.  Cardiology on board ? ?GOC: Palliative care consultation has been requested.  He has had a recurrent multiple admissions, remains at risk of frequent hospitalization, respiratory compromise, has had previous COVID last year and since then was dependent with trach and PEG tube, trach has been recently reversed after which it seems he is having aspiration issues and secretion issues. ? ?DVT prophylaxis: SCDs Start: 10/23/21 2302 high risk for VTE start Lovenox ?Code Status:   Code Status: Full Code ?Family Communication: plan of care discussed with patient/RN at bedside. ?I had called patient's daughter and updated yesterday ? ?Patient status is: Inpatient level of care: Telemetry  ?Remains inpatient because: Ongoing management of aspiration pneumonia ?Patient currently not stable ? ?Dispo: The patient is from: SNF ?           Anticipated disposition: Skilled nursing facility once S/P is tolerating and respiratory status is stable hopefully next 24 hours ? ?Mobility Assessment (last 72 hours)   ? ? Mobility Assessment   ? ? [place] Name 10/24/21 2030 10/24/21 1600  ?  ?  ?  ? Does patient have an order for bedrest or is patient medically unstable Yes- Bedfast (Level 1) - Complete Yes- Bedfast (Level 1) - Complete     ? ?  ?  ? ?  ?  ? ?Objective: ?Vitals last 24 hrs: ?Vitals:  ? 10/25/21 0219 10/25/21 0444 10/25/21 0524 10/25/21 0843  ?BP: 137/82 (!) 141/77 121/66 113/70  ?Pulse: 68 (!) 48 (!) 48 (!) 41  ?Resp: $Remov'17  17 18  'wSUMqx$ ?Temp: 97.6 ?F (36.4 ?C) 97.6 ?F (36.4 ?C) (!) 97.3 ?F (36.3 ?C) (!) 97.3 ?F (36.3 ?C)  ?TempSrc: Axillary Oral Oral Rectal  ?SpO2: 100% 100% 100% 100%  ?Weight:      ? ?Weight change:  ? ?Physical Examination: ?General exam: Sleepy able to wake up and follow some commands,on RA, audible secretions coming form throat ?HEENT:Oral mucosa moist, Ear/Nose WNL grossly, dentition normal. ?Respiratory system: bilaterally  diminished,no use of accessory muscle ?Cardiovascular system: S1 & S2 +, No JVD,. ?Gastrointestinal system: Abdomen soft, PEG+, NT,ND, BS+ ?Nervous System:Alert, awake, moving ALL extremities and grossly nonfocal ?Extremities: edema neg,distal peripheral pulses palpable.  ?Skin: No rashes,no icterus. ?MSK: thin muscle bulk,tone, power ? ?Medications reviewed:  ?Scheduled Meds: ? famotidine  20 mg Per Tube QHS  ? feeding supplement (JEVITY 1.5 CAL/FIBER)  1,000 mL Per Tube Q24H  ? QUEtiapine  25 mg Per Tube QHS  ? ?Continuous Infusions: ? ampicillin-sulbactam (UNASYN) IV 3 g (10/25/21 1012)  ? dextrose 125 mL/hr at 10/25/21 0657  ? ?  ?Diet Order   ? ?       ?  Diet NPO time specified  Diet effective now       ?  ? ?  ?  ? ?  ? ?Intake/Output Summary (Last 24 hours) at 10/25/2021 1115 ?Last data filed at 10/25/2021 0645 ?Gross per 24 hour  ?Intake 1406.48 ml  ?Output 850 ml  ?Net 556.48 ml  ? ?Net IO Since Admission:  656.48 mL [10/25/21 1115]  ?Wt Readings from Last 3 Encounters:  ?10/24/21 94.1 kg  ?05/18/21 86.7 kg  ?04/26/21 86.3 kg  ?  ? ?Unresulted Labs (From admission, onward)  ? ?  Start     Ordered  ? 10/25/21 0500  CBC  Daily,   R     ?Question:  Specimen collection method  Answer:  Lab=Lab collect  ? 10/24/21 1828  ? 10/25/21 6659  Basic metabolic panel  Daily,   R     ?Question:  Specimen collection method  Answer:  Lab=Lab collect  ? 10/24/21 1828  ? 10/24/21 1754  Methylmalonic acid, serum  Add-on,   AD       ? 10/24/21 1753  ? ?  ?  ? ?  ?Data Reviewed: I have personally reviewed following labs and imaging studies ?CBC: ?Recent Labs  ?Lab 2021/11/10 ?2215 10/24/21 ?9357 10/25/21 ?0357  ?WBC 8.3 9.6 4.2  ?NEUTROABS 6.1 7.3  --   ?HGB 10.5* 9.7* 9.3*  ?HCT 32.5* 30.4* 29.8*  ?MCV 93.7 93.0 94.9  ?PLT 167 160 146*  ? ?Basic Metabolic Panel: ?Recent Labs  ?Lab 11/10/2021 ?2215 10/24/21 ?0177 10/25/21 ?0357  ?NA 136 139 139  ?K 4.9 4.2 3.9  ?CL 101 103 103  ?CO2 $Rem'27 27 26  'ogSE$ ?GLUCOSE 84 73 78  ?BUN 39* 35* 36*   ?CREATININE 1.30* 1.30* 1.19  ?CALCIUM 9.2 9.3 9.2  ?MG  --  2.0  --   ? ?GFR: ?CrCl cannot be calculated (Unknown ideal weight.). ?Liver Function Tests: ?Recent Labs  ?Lab 10/24/21 ?9390  ?AST 20  ?ALT 44  ?ALK

## 2021-10-25 NOTE — Progress Notes (Addendum)
Progress Note ? ?Patient Name: Jack Rivas ?Date of Encounter: 10/25/2021 ? ?Attending physician: Jack Boast, MD ?Primary care provider: Patient, No Pcp Per (Inactive) ? ?Subjective: ?Jack Rivas is a 76 y.o. male who was seen and examined at bedside  ?Now normothermic. ?Slightly agitated and audible secretions noted by nursing staff overnight/early this morning. ?Sleeping at the time of evaluation. ?Unable to participate in conversations. ?No family present at bedside. ?Case discussed and reviewed with his nurse. ? ?Objective: ?Vital Signs in the last 24 hours: ?Temp:  [95.9 ?F (35.5 ?C)-98.2 ?F (36.8 ?C)] 98.2 ?F (36.8 ?C) (04/11 1553) ?Pulse Rate:  [41-68] 43 (04/11 1553) ?Resp:  [13-18] 18 (04/11 1553) ?BP: (113-141)/(66-82) 120/67 (04/11 1553) ?SpO2:  [95 %-100 %] 98 % (04/11 1553) ? ?Intake/Output: ? ?Intake/Output Summary (Last 24 hours) at 10/25/2021 1905 ?Last data filed at 10/25/2021 1500 ?Gross per 24 hour  ?Intake 2661.84 ml  ?Output 1450 ml  ?Net 1211.84 ml  ?  ?Net IO Since Admission: 1,315.38 mL [10/25/21 1905] ? ?Weights:  ?Filed Weights  ? 10/24/21 1420  ?Weight: 94.1 kg  ? ? ?Telemetry: Personally reviewed.  Sinus bradycardia with episodes of first-degree AV block and PVCs. ? ?Physical examination: ?PHYSICAL EXAM: ? ?  10/25/2021  ?  3:53 PM 10/25/2021  ? 11:48 AM 10/25/2021  ?  8:43 AM  ?Vitals with BMI  ?Systolic 120 117 161  ?Diastolic 67 71 70  ?Pulse 43 46 41  ? ? ?CONSTITUTIONAL: Appears older than stated age, frail, warm to touch, temporal wasting, dry skin, hemodynamically stable, no acute distress.   ?SKIN: Skin is cold and dry. No rash noted. No cyanosis. No pallor. No jaundice ?HEAD: Normocephalic and atraumatic.  ?EYES: No scleral icterus ?MOUTH/THROAT: Dry oral membranes.  ?NECK: No JVD present. No thyromegaly noted. No carotid bruits  ?CHEST Normal respiratory effort. No intercostal retractions  ?LUNGS: Clear to auscultation upper lung fields with decreased breath sounds at the  bases. ?CARDIOVASCULAR: Regular, bradycardic, positive S1-S2, no murmurs rubs or gallops appreciated.  ?ABDOMINAL: Soft, nontender, nondistended, decreased breath sounds bilaterally, feeding tube present, no apparent ascites.  ?EXTREMITIES: Dry skin, poor nail hygiene, no pitting edema, warm to touch.  Contracted extremities ?HEMATOLOGIC: No significant bruising ?NEUROLOGIC: Nonverbal, withdraws to noxious stimuli, does not respond to verbal stimuli, unable to evaluate range of motion.   ?No significant change in physical examination since yesterday. ? ?Lab Results: ?Hematology ?Recent Labs  ?Lab 10/23/21 ?2215 10/24/21 ?0960 10/25/21 ?0357  ?WBC 8.3 9.6 4.2  ?RBC 3.47* 3.27* 3.14*  ?HGB 10.5* 9.7* 9.3*  ?HCT 32.5* 30.4* 29.8*  ?MCV 93.7 93.0 94.9  ?MCH 30.3 29.7 29.6  ?MCHC 32.3 31.9 31.2  ?RDW 13.0 12.8 13.1  ?PLT 167 160 146*  ? ? ?Chemistry ?Recent Labs  ?Lab 10/23/21 ?2215 10/24/21 ?4540 10/25/21 ?0357  ?NA 136 139 139  ?K 4.9 4.2 3.9  ?CL 101 103 103  ?CO2 27 27 26   ?GLUCOSE 84 73 78  ?BUN 39* 35* 36*  ?CREATININE 1.30* 1.30* 1.19  ?CALCIUM 9.2 9.3 9.2  ?PROT  --  7.4  --   ?ALBUMIN  --  3.2*  --   ?AST  --  20  --   ?ALT  --  44  --   ?ALKPHOS  --  76  --   ?BILITOT  --  0.5  --   ?GFRNONAA 57* 57* >60  ?ANIONGAP 8 9 10   ?  ? ?Cardiac Enzymes: ?Cardiac Panel (last 3 results) ?No results for  input(s): CKTOTAL, CKMB, TROPONINIHS, RELINDX in the last 72 hours. ? ?BNP (last 3 results) ?Recent Labs  ?  05/10/21 ?1219  ?BNP 53.6  ? ? ?ProBNP (last 3 results) ?No results for input(s): PROBNP in the last 8760 hours. ? ? ?DDimer No results for input(s): DDIMER in the last 168 hours.  ? ?Hemoglobin A1c:  ?Lab Results  ?Component Value Date  ? HGBA1C 5.6 10/24/2021  ? MPG 114.02 10/24/2021  ? ? ?TSH  ?Recent Labs  ?  03/19/21 ?2224 05/12/21 ?0127 10/24/21 ?1911  ?TSH 5.644* 4.334 3.300  ? ? ?Lipid Panel No results found for: CHOL, TRIG, HDL, CHOLHDL, VLDL, LDLCALC, LDLDIRECT ? ?Imaging: ?DG Chest 2 View ? ?Result Date:  10/23/2021 ?CLINICAL DATA:  evaluate for aspiration PNA EXAM: CHEST - 2 VIEW.  Patient is slightly rotated on frontal view. COMPARISON:  Chest x-ray 05/10/2021 FINDINGS: The heart and mediastinal contours are unchanged. Aortic calcification. Developing patchy airspace opacity within the right lower lobe. No pulmonary edema. No pleural effusion. No pneumothorax. No acute osseous abnormality.  Left humeral head surgical hardware. IMPRESSION: Developing patchy airspace opacity within the right lower lobe. Finding consistent with infection/inflammation. Followup PA and lateral chest X-ray is recommended in 3-4 weeks following therapy to ensure resolution. Electronically Signed   By: Tish Frederickson M.D.   On: 10/23/2021 20:56  ? ?CT SOFT TISSUE NECK WO CONTRAST ? ?Result Date: 10/24/2021 ?CLINICAL DATA:  Dysphagia, foreign body kfc chicken aspiration EXAM: CT NECK WITHOUT CONTRAST TECHNIQUE: Multidetector CT imaging of the neck was performed following the standard protocol without intravenous contrast. RADIATION DOSE REDUCTION: This exam was performed according to the departmental dose-optimization program which includes automated exposure control, adjustment of the mA and/or kV according to patient size and/or use of iterative reconstruction technique. COMPARISON:  None. FINDINGS: Pharynx and larynx: There is some soft tissue thickening and debris at the level of the supraglottic larynx and adjacent pharynx. No radiopaque foreign body. Salivary glands: Parotid and submandibular glands unremarkable. Thyroid: Normal. Lymph nodes: No enlarged or abnormal density nodes. Vascular: Calcified plaque at the common carotid bifurcations. Limited intracranial: Parenchymal volume loss. No acute abnormality. Visualized orbits: Deformity of the medial right orbital wall may reflect prior trauma. Bilateral lens replacements. Mastoids and visualized paranasal sinuses: Chronic right maxillary sinusitis. Minimal right mastoid  opacification. Skeleton: Advanced degenerative changes of the cervical spine. Interbody fusion at C5-C6. Degenerative changes of the right glenohumeral joint. Upper chest: Dictated separately. Other: None. IMPRESSION: Some soft tissue thickening and debris at the level of the supraglottic larynx and adjacent pharynx may reflect retained secretions and wall edema. However, there is no radiopaque foreign body. Airway is patent. Electronically Signed   By: Guadlupe Spanish M.D.   On: 10/24/2021 21:17  ? ?CT CHEST WO CONTRAST ? ?Result Date: 10/24/2021 ?CLINICAL DATA:  Aspiration. EXAM: CT CHEST WITHOUT CONTRAST TECHNIQUE: Multidetector CT imaging of the chest was performed following the standard protocol without IV contrast. RADIATION DOSE REDUCTION: This exam was performed according to the departmental dose-optimization program which includes automated exposure control, adjustment of the mA and/or kV according to patient size and/or use of iterative reconstruction technique. COMPARISON:  March 20, 2021 FINDINGS: Cardiovascular: There is marked severity calcification of the thoracic aorta, without evidence of aortic aneurysm. There is mild cardiomegaly with marked severity coronary artery calcification. There is a small pericardial effusion. Mediastinum/Nodes: No enlarged mediastinal or axillary lymph nodes. Thyroid gland, trachea, and esophagus demonstrate no significant findings. Lungs/Pleura: Mild areas of right middle  lobe and bilateral lobe atelectasis and/or infiltrate are seen, right greater than left. There is no evidence of a pleural effusion or pneumothorax. Upper Abdomen: No acute abnormality. Musculoskeletal: There is marked severity kyphosis of the upper thoracic spine. No acute osseous abnormalities are seen. IMPRESSION: 1. Mild right middle lobe and bilateral lobe atelectasis and/or infiltrate, right greater than left. 2. Small pericardial effusion. Aortic Atherosclerosis (ICD10-I70.0). Electronically  Signed   By: Aram Candelahaddeus  Houston M.D.   On: 10/24/2021 21:11   ? ?CARDIAC DATABASE: ?EKG: ?10/24/2021: Sinus bradycardia without underlying injury pattern, baseline artifact in multiple precordial leads. ?  ?Echocardiogram: ?10/31

## 2021-10-25 NOTE — TOC Progression Note (Addendum)
Transition of Care (TOC) - Progression Note  ? ? ?Patient Details  ?Name: Evaristo Tsuda ?MRN: [id number] ?Date of Birth: 08-15-1945 ? ?Transition of Care (TOC) CM/SW Contact  ?Geni Bers, RN ?Phone Number: ?10/25/2021, 2:43 PM ? ?Clinical Narrative:    ?Pt is from [facility]. FL2 completed.  ? ? ?Expected Discharge Plan: Skilled Nursing Facility ?Barriers to Discharge: Continued Medical Work up ? ?Expected Discharge Plan and Services ?Expected Discharge Plan: Skilled Nursing Facility ?  ?  ?  ?Living arrangements for the past 2 months: Skilled Nursing Facility ?                ?  ?  ?  ?  ?  ?  ?  ?  ?  ?  ? ? ?Social Determinants of Health (SDOH) Interventions ?  ? ?Readmission Risk Interventions ? ?  03/28/2021  ?  3:34 PM  ?Readmission Risk Prevention Plan  ?Transportation Screening Complete  ?PCP or Specialist Appt within 3-5 Days Complete  ?HRI or Home Care Consult Complete  ?Social Work Consult for Recovery Care Planning/Counseling Complete  ?Palliative Care Screening Not Applicable  ?Medication Review Oceanographer) Complete  ? ? ?

## 2021-10-25 NOTE — Progress Notes (Signed)
Initial Nutrition Assessment ? ?DOCUMENTATION CODES:  ? ?Non-severe (moderate) malnutrition in context of chronic illness ? ?INTERVENTION:  ?- increase Jevity 1.5 by 10 ml every 4 hours to reach goal rate of 65 ml/hr with 45 ml Prosource TF once/day and 200 ml free water every 4 hours. ? ?- at goal rate, this regimen will provide 2380 kcal, 110 grams protein, and 2386 ml free water. ? ? ?NUTRITION DIAGNOSIS:  ? ?Moderate Malnutrition related to chronic illness (advanced dementia) as evidenced by moderate fat depletion, moderate muscle depletion. ? ?GOAL:  ? ?Patient will meet greater than or equal to 90% of their needs ? ?MONITOR:  ? ?TF tolerance, Labs, Weight trends ? ?REASON FOR ASSESSMENT:  ? ?Consult ?Enteral/tube feeding initiation and management ? ?ASSESSMENT:  ? ?76 y.o. male with medical history of chronic dysphagia s/p PEG tube, advanced dementia, non-verbal at baseline, chronic anemia with baseline hemoglobin 8.5-10. He was admitted on 4/9 due to suspected aspiration PNA. ? ?Able to talk with RN at bedside who shares TF was not started until day shift today d/t HR and BP yesterday.  ? ?Patient laying in bed. No visitors present at the time of Jack Rivas visit. Patient requesting water and states that he is hungry. SLP arrived as Jack Rivas was leaving patient's room.  ? ?Patient unable to provide any other information at the time of visit.  ? ?He has PEG from [facility] and was last seen by a [place] Jack Rivas on 05/13/21 at which time he was receiving Jevity 1.5 @ 65 ml/hr with 45 ml Prosource TF BID and 200 ml free water every 4 hours. ? ?Order currently in place for and patient receiving Jevity 1.5 @ 25 ml/hr which is providing 900 kcal, 38 grams protein, and 456 ml free water.  ? ?Weight yesterday was 207 lb which is consistent with weight from 03/25/21 and up from weight recorded on 04/26/21 and 05/18/21. No weight recording between 05/18/21 and yesterday.  ? ? ?Labs reviewed; CBGs: 99, 79, and 107 mg/dl, BUN: 36  mg/dl. ?Medications reviewed; 20 mg pepcid per PEG/day. ?IVF; D5 @ 125 ml/hr started 4/10 at 1745 (510 kcal/24 hrs). ?  ? ?NUTRITION - FOCUSED PHYSICAL EXAM: ? ?Flowsheet Row Most Recent Value  ?Orbital Region Moderate depletion  ?Upper Arm Region Moderate depletion  ?Thoracic and Lumbar Region Unable to assess  ?Buccal Region Mild depletion  ?Temple Region Mild depletion  ?Clavicle Bone Region Mild depletion  ?Clavicle and Acromion Bone Region Mild depletion  ?Scapular Bone Region Moderate depletion  ?Dorsal Hand Unable to assess  [mittens]  ?Patellar Region Mild depletion  ?Anterior Thigh Region Mild depletion  ?Posterior Calf Region Mild depletion  ?Edema (Jack Rivas Assessment) None  ?Hair Reviewed  ?Eyes Reviewed  ?Mouth Reviewed  Jack Rivas to suction]  ?Skin Reviewed  ?Nails Unable to assess  ? ?  ? ? ?Diet Order:   ?Diet Order   ? ?       ?  Diet NPO time specified  Diet effective now       ?  ? ?  ?  ? ?  ? ? ?EDUCATION NEEDS:  ? ?No education needs have been identified at this time ? ?Skin:  Skin Assessment: Reviewed RN Assessment ? ?Last BM:  PTA/unknown ? ?Height:  ? ?Ht Readings from Last 1 Encounters:  ?04/20/21 6\' 3"  (1.905 m)  ? ? ?Weight:  ? ?Wt Readings from Last 1 Encounters:  ?10/24/21 94.1 kg  ? ? ? ?BMI:  Body mass  index is 25.93 kg/m?. ? ?Estimated Nutritional Needs:  ?Kcal:  2200-2400 kcal ?Protein:  110-120 grams ?Fluid:  >/= 2.5 L/day ? ? ? ? ?Jack Rivas, Jack Rivas, Jack Rivas, Jack Rivas ?Registered Dietitian II ?Inpatient Clinical Nutrition ?Jack Rivas pager # and on-call/weekend pager # available in AMION  ? ?

## 2021-10-25 NOTE — Evaluation (Signed)
Clinical/Bedside Swallow Evaluation ?Patient Details  ?Name: Jack Rivas ?MRN: [id number] ?Date of Birth: [birth year] ? ?Today's Date: 10/25/2021 ?Time: SLP Start Time (ACUTE ONLY): 1018 SLP Stop Time (ACUTE ONLY): 1051 ?SLP Time Calculation (min) (ACUTE ONLY): 33 min ? ?Past Medical History:  ?Past Medical History:  ?Diagnosis Date  ? Dementia (HCC)   ? Hypertension   ? Uses feeding tube   ? ?Past Surgical History:  ?Past Surgical History:  ?Procedure Laterality Date  ? CATARACT EXTRACTION, BILATERAL    ? KNEE SURGERY    ? ?HPI:  ?Pt is a 76 yo male residen to SNF with h/o dementia, recurrent aspiration pnas (dating back years - since in [place] when he had recurrent asp pnas), s/p PEG, 3 admits in the last six months with pna per review of chart.  Last MBS 08/30/2021 with recommendations for dys1/thin diet - delay in swallow, no aspiration, laryngeal penetration x1 with thin in larynx above vocal cords.   Per notes, pt was given [facility] by family and aspirated - prompting hospital admit.  Pt is familiar to SLP from prior hospital admits and have spoken with daughter Jack Rivas.  He also has h/o silent aspiration, gagging choking with intake at SNF from prior hospital notes. Pt remains full code with full treatment.  Speech/swallow evaluation ordered.  ?  ?Assessment / Plan / Recommendation  ?Clinical Impression ? Pt with currently has wet/gurgly voice without awareness, presumed due to secretion aspiration. He did awaken to SLP verbal stimulation and accepted single ice chips.  Oral motor exam largely unremarkable x ? slightly diminished right palatal elevation and he is mildly dysarthric.  Single ice chips given with pt readily masticating - however swallow initiation was ranged from signficantly delayed (up to 12 seconds) to mildly despite verbal cues to swallow.   Audible swallow observed and pt demonstrated post-swallow wet voice/cough with 3/8 boluses.  Fortunately his cough is strong and he was able to propel some secretions  into his oral cavity with verbal cue to allow SLP to orally suction.  This ability to follow directions today may help in consideration for pleasure feeds with precautions.  At this time, recommend NPO x single ice chips provided by RN, SLP or therapy only with strict precautions. Benefit of ice chips includes that this will help pt manage secretions and improve satisfaction.  Pt did verbalize that he did swallow with 2/8 ice chip boluses when he had not - likely due to sensory deficits/dementia.   Upon review of prior MBS, pt noted to piecemeal with thin liquid and piecemealed bolus was retained in pharynx for at least 20 seconds *flouro off and on due to minimize xray exposure* and likely up to 40 seconds before swallowing.  In this SLPs opinion, pt will be chronic aspiration risk with and without po intake based on prior MBS studies, dementia, reliance on others for feeding, recurrent hospital admissions for pna (x2 in six months) etc.    Jack Rivas is currently aspirating secretions - and even with his PEG tube secretion aspiration cannot be prevented. As pt has likely secretions in supraglottis and pharyngeal wall edema- suspect agitation from potential aspiration.  Pt denies having issues with secretions prior to admission - but he does admit to coughing associated with food more than liquids - he doe have dementia and uncertain to accuracy of information he provided. ?SLP Visit Diagnosis: Dysphagia, oropharyngeal phase (R13.12) (based on clinical swallow evaluation coupled with prior MBS studies and review of pt's chart) ?   ?  Aspiration Risk ?   Severe ?  ?Diet Recommendation Ice chips PRN after oral care (single ice chips)  ? ?Liquid Administration via: Cup ?Medication Administration: Via alternative means ?Supervision: Full supervision/cueing for compensatory strategies ?Compensations: Small sips/bites;Slow rate (assure pt swallows with each ice chip bolus; cue him to cough and expectorate prn, oral suction prn)   ?  ?Other  Recommendations Oral Care Recommendations: Oral care prior to ice chip/H20 ?Other Recommendations: Have oral suction available   ? ?Recommendations for follow up therapy are one component of a multi-disciplinary discharge planning process, led by the attending physician.  Recommendations may be updated based on patient status, additional functional criteria and insurance authorization. ? ?Follow up Recommendations Skilled nursing-short term rehab (<3 hours/day)  ? ? ?  ?Assistance Recommended at Discharge Frequent or constant Supervision/Assistance  ?Functional Status Assessment Patient has had a recent decline in their functional status and/or demonstrates limited ability to make significant improvements in function in a reasonable and predictable amount of time  ?Frequency and Duration min 2x/week  ?2 weeks ?  ?   ? ?Prognosis Prognosis for Safe Diet Advancement: Fair ?Barriers to Reach Goals: Time post onset;Cognitive deficits  ? ?  ? ?Swallow Study   ?General Date of Onset: 10/25/21 ?HPI: Pt is a 76 yo male residen to SNF with h/o dementia, recurrent aspiration pnas (dating back years - since [place] when [place] recurrent asp pnas), s/p PEG, 3 admits in the last six months with pna per review of chart.  Last MBS 08/30/2021 with recommendations for dys1/thin diet - delay in swallow, no aspiration, laryngeal penetration x1 with thin in larynx above vocal cords.   [facility] and aspirated - prompting hospital admit.  Pt is familiar to SLP from prior hospital admits and have spoken with daughter Jack Murdochanisha.  He also has h/o silent aspiration, gagging choking with intake at SNF from prior hospital notes. Pt remains full code with full treatment.  Speech/swallow evaluation ordered. ?Diet Prior to this Study: NPO ?Temperature Spikes Noted: No ?Respiratory Status: Room air ?History of Recent Intubation: No ?Behavior/Cognition: Alert ?Oral Cavity Assessment: Within Functional Limits ?Oral  Care Completed by SLP: Yes ?Oral Cavity - Dentition: Adequate natural dentition ?Vision: Impaired for self-feeding ?Self-Feeding Abilities: Total assist (pt with bilateral mitts - has been agitated during hospital admit) ?Patient Positioning: Upright in bed ?Baseline Vocal Quality: Wet;Suspected CN X (Vagus) involvement;Other (comment) (wet voice at baseline) ?Volitional Cough: Wet;Congested ?Volitional Swallow: Unable to elicit  ?  ?Oral/Motor/Sensory Function Overall Oral Motor/Sensory Function: Generalized oral weakness   ?Ice Chips Ice chips: Impaired ?Presentation: Spoon ?Oral Phase Impairments: Impaired mastication ?Pharyngeal Phase Impairments: Suspected delayed Swallow;Cough - Immediate;Cough - Delayed;Wet Vocal Quality ?Other Comments: Delayed swallow up to 10 seconds with pt stating "I swallowed" when he had not; Cough x3 of approximately 8 ice chips - with ability to cough and expectorate viscous clear/white secretions   ?Thin Liquid Thin Liquid: Not tested ?Other Comments: due to overt aspiration of secretions  ?  ?Nectar Thick Nectar Thick Liquid: Not tested ?Other Comments: due to overt secretion aspiration   ?Honey Thick Honey Thick Liquid: Not tested ?Other Comments: due to overt secretion aspiration   ?Puree Puree: Not tested ?Other Comments: due to vert secretion aspiration   ?Solid ? ? ?  Solid: Not tested ?Other Comments: due to overt secretion aspiration  ? ?  ? ?Jack Rivas, Jack Rivas ?10/25/2021,12:02 PM ?Rolena Infanteammy K, MS [facility] SLP ?Acute Rehab Services ?  Office [phone] ?Pager [phone] ? ? ? ?

## 2021-10-25 NOTE — [place] FL2 (Signed)
?[place] MEDICAID FL2 LEVEL OF CARE SCREENING TOOL  ?  ? ?IDENTIFICATION  ?Patient Name: ?Jack Rivas Birthdate: 1946/04/08 Sex: male Admission Date (Current Location): ?10/23/2021  ?[place] and [place] Number: ? [place] ?  Facility and Address:  ?[facility],  [place], [place] ?     Provider Number: ?[id number]  ?Attending Physician Name and Address:  ?Antonieta Pert, MD ? Relative Name and Phone Number:  ?Jack Rivas Daughter   [phone] ?   ?Current Level of Care: ?Hospital Recommended Level of Care: ?[place] Prior Approval Number: ?  ? ?Date Approved/Denied: ?  PASRR Number: ?[id number] ? ?Discharge Plan: ?SNF ?  ? ?Current Diagnoses: ?Patient Active Problem List  ? Diagnosis Date Noted  ? 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 Debility 10/25/2021  ? Hypothermia 10/25/2021  ? Hypoglycemia 10/25/2021  ? AKI (acute kidney injury) ([place]) 10/25/2021  ? Anemia of chronic disease 10/23/2021  ? HCAP (healthcare-associated pneumonia) 04/20/2021  ? CKD (chronic kidney disease), stage III ([place]) 04/20/2021  ? Bradycardia 04/20/2021  ? Severe sepsis ([place]) 04/20/2021  ? Malnutrition of moderate degree 03/21/2021  ? Acute metabolic encephalopathy [id number]  ? Aspiration pneumonia ([place]) 03/20/2021  ? Dementia ([place]) 03/20/2021  ? Dysphagia 03/20/2021  ? ? ?Orientation RESPIRATION BLADDER Height & Weight   ?  ?Self, Place ? Normal Incontinent Weight: 94.1 kg ?Height:     ?BEHAVIORAL SYMPTOMS/MOOD NEUROLOGICAL BOWEL NUTRITION STATUS  ?    Incontinent Diet (Feeding tube (200 ml every 4 hours of free water to supplement feeds). See discharge summary.)  ?AMBULATORY STATUS COMMUNICATION OF NEEDS Skin   ?Extensive Assist Verbally Normal ?  ?  ?  ?    ?     ?     ? ? ?Personal Care Assistance Level of Assistance  ?Bathing, Feeding, Dressing Bathing Assistance: Maximum assistance ?Feeding assistance: Maximum assistance ?Dressing Assistance: Maximum assistance ?   ? ?Functional  Limitations Info  ?Sight, Hearing, Speech Sight Info: Adequate ?Hearing Info: Impaired ?Speech Info: Adequate  ? ? ?SPECIAL CARE FACTORS FREQUENCY  ?PT (By licensed PT), OT (By licensed OT)   ?  ?PT Frequency: 5x week ?OT Frequency: 5x wek ?  ?  ?  ?   ? ? ?Contractures Contractures Info: Present  ? ? ?Additional Factors Info  ?Code Status, Allergies Code Status Info: FULL ?Allergies Info: No Known Allergies ?  ?  ?  ?   ? ?Current Medications (10/25/2021):  This is the current hospital active medication list ?Current Facility-Administered Medications  ?Medication Dose Route Frequency Provider Last Rate Last Admin  ? acetaminophen (TYLENOL) tablet 650 mg  650 mg Oral Q6H PRN Howerter, Justin B, DO      ? Or  ? acetaminophen (TYLENOL) suppository 650 mg  650 mg Rectal Q6H PRN Howerter, Justin B, DO      ? Ampicillin-Sulbactam (UNASYN) 3 g in sodium chloride 0.9 % 100 mL IVPB  3 g Intravenous Q6H Angela Adam, RPH 200 mL/hr at 10/25/21 1012 3 g at 10/25/21 1012  ? dextrose 5 % solution   Intravenous Continuous Kathryne Eriksson, NP 125 mL/hr at 10/25/21 0657 New Bag at 10/25/21 0657  ? famotidine (PEPCID) tablet 20 mg  20 mg Per Tube QHS Kc, Ramesh, MD      ? feeding supplement (JEVITY 1.5 CAL/FIBER) liquid 1,000 mL  1,000 mL Per Tube Continuous Antonieta Pert, MD 65 mL/hr at 10/25/21 1204 Restarted at 10/25/21 1204  ? feeding supplement (PROSource TF) liquid  45 mL  45 mL Per Tube Daily Kc, Ramesh, MD   45 mL at 10/25/21 1226  ? free water 200 mL  200 mL Per Tube Q4H Kc, Maren [place], MD   200 mL at 10/25/21 1211  ? QUEtiapine (SEROQUEL) tablet 25 mg  25 mg Per Tube QHS Antonieta Pert, MD      ? ? ? ?Discharge Medications: ?Please see discharge summary for a list of discharge medications. ? ?Relevant Imaging Results: ? ?Relevant Lab Results: ? ? ?Additional Information ?SS#[id number] ? ?Jack Helsley, RN ? ? ? ? ?

## 2021-10-26 DIAGNOSIS — N1831 Chronic kidney disease, stage 3a: Secondary | ICD-10-CM

## 2021-10-26 DIAGNOSIS — D638 Anemia in other chronic diseases classified elsewhere: Secondary | ICD-10-CM | POA: Diagnosis not present

## 2021-10-26 DIAGNOSIS — J690 Pneumonitis due to inhalation of food and vomit: Secondary | ICD-10-CM | POA: Diagnosis not present

## 2021-10-26 DIAGNOSIS — Z515 Encounter for palliative care: Secondary | ICD-10-CM

## 2021-10-26 DIAGNOSIS — N179 Acute kidney failure, unspecified: Secondary | ICD-10-CM | POA: Diagnosis not present

## 2021-10-26 DIAGNOSIS — G9341 Metabolic encephalopathy: Secondary | ICD-10-CM

## 2021-10-26 DIAGNOSIS — R001 Bradycardia, unspecified: Secondary | ICD-10-CM

## 2021-10-26 LAB — GLUCOSE, CAPILLARY
Glucose-Capillary: 110 mg/dL — ABNORMAL HIGH (ref 70–99)
Glucose-Capillary: 96 mg/dL (ref 70–99)
Glucose-Capillary: 109 mg/dL — ABNORMAL HIGH (ref 70–99)
Glucose-Capillary: 101 mg/dL — ABNORMAL HIGH (ref 70–99)
Glucose-Capillary: 108 mg/dL — ABNORMAL HIGH (ref 70–99)

## 2021-10-26 LAB — CBC
HCT: 29.5 % — ABNORMAL LOW (ref 39.0–52.0)
Hemoglobin: 9.3 g/dL — ABNORMAL LOW (ref 13.0–17.0)
MCH: 29.8 pg (ref 26.0–34.0)
MCHC: 31.5 g/dL (ref 30.0–36.0)
MCV: 94.6 fL (ref 80.0–100.0)
Platelets: 140 10*3/uL — ABNORMAL LOW (ref 150–400)
RBC: 3.12 MIL/uL — ABNORMAL LOW (ref 4.22–5.81)
RDW: 12.9 % (ref 11.5–15.5)
WBC: 3.9 10*3/uL — ABNORMAL LOW (ref 4.0–10.5)
nRBC: 0 % (ref 0.0–0.2)

## 2021-10-26 LAB — BASIC METABOLIC PANEL
Anion gap: 10 (ref 5–15)
BUN: 31 mg/dL — ABNORMAL HIGH (ref 8–23)
CO2: 26 mmol/L (ref 22–32)
Calcium: 9.1 mg/dL (ref 8.9–10.3)
Chloride: 104 mmol/L (ref 98–111)
Creatinine, Ser: 1.36 mg/dL — ABNORMAL HIGH (ref 0.61–1.24)
GFR, Estimated: 54 mL/min — ABNORMAL LOW (ref >60)
Glucose, Bld: 100 mg/dL — ABNORMAL HIGH (ref 70–99)
Potassium: 3.7 mmol/L (ref 3.5–5.1)
Sodium: 140 mmol/L (ref 135–145)

## 2021-10-26 MED ORDER — FOOD THICKENER (SIMPLYTHICK)
1.0000 | ORAL | Status: DC | PRN
Start: 2021-10-26 — End: 2021-10-29

## 2021-10-26 NOTE — Progress Notes (Addendum)
EP called regarding bradycardia ?Telemetry was reviewed ?SB generally appears 40's with some PVCs. ?He does have SB to 30's, transient high 20's, though in review of chart associated with sleep and in d/w attending cardiologist, patient often asleep during daytime hours as well. ?I did not see any AV block on review of his telemetry ?At baseline has poor functional status, with dementia, frailty, malnutrition, recurrent aspiration PNAs/comorbid conditions, increased infection risk with PEG tube, in the absence of any symptoms of bradycardia would not pursue PPM. ? ?Defer decision on need for ongoing telemetry monitoring to attending team ? ?Reviewed with Dr. Elberta Fortis who discussed case with attending MD ?Francis Dowse, PA-C ? ?Kele Withem Elberta Fortis, MD ?

## 2021-10-26 NOTE — Assessment & Plan Note (Signed)
Present on admission. Now resolved. ?

## 2021-10-26 NOTE — Progress Notes (Signed)
? ?PROGRESS NOTE ? ? ? ?Jack Rivas  [id number] DOB: 30-Jan-1946 DOA: 10/23/2021 ?PCP: Patient, No Pcp Per (Inactive) ? ? ?Brief Narrative: ?Jack Rivas is a 76 y.o. male with a history of chronic dysphagia s/p PEG tube on tube feeds and dementia. Patient presented after an aspiration event as his nursing facility with resultant aspiration pneumonia. Antibiotics initiated. Patient also noticed to have significant bradycardia, although asymptomatic. Cardiology consulted. ? ? ? ?Assessment and Plan: ?* Aspiration pneumonia ([place]) ?Aspiration event while at his facility. In setting of oral intake in addition to tube feeding. CT soft tissue of neck and CT chest significant for possible wall edema/retained secretions in addition to right middle/bilateral lower lobe infiltrate. Patient started on Unasyn IV for management of pneumonia. Speech therapy consulted. ?-Continue Unasyn IV ?-Continued speech therapy recommendations ?-NPO ? ?AKI (acute kidney injury) ([place]) ?Slight increase from baseline of about 1 with peak up to 1.36. ? ?Anemia of chronic disease ?Hemoglobin stable. ? ?Bradycardia ?Patient appears to have a prior history. Concern this current event could be related to initial hypothermia. Continues to have continued bradycardia. Asymptomatic. Heart rates as low as 30. Cardiology, Dr. Terri Skains consulted and has recommended EP consult. ?-Continue Telemetry ?-Consult EP for evaluation ? ?Chronic kidney disease, stage 3a ([place]) ?Stable. ? ?Malnutrition of moderate degree ?-Continue tube feeds ? ?Dysphagia ?Chronic. History of PEG tube on tube feeds and free water.  ?-Continue tube feeds ? ?Dementia ([place]) ?Patient currently resides at a nursing facility. Patient is s/p PEG tube. On Seroquel. Oriented to self and answers questions/follows commands. Patient follows with [facility] as an outpatient. Palliative care consulted this admission ?-Continue Seroquel ?-Palliative care  recommendations ? ?Hypothermia-resolved as of 10/26/2021 ?Present on admission. Now resolved. ? ? ? ?DVT prophylaxis: SCDs ?Code Status:   Code Status: Full Code ?Family Communication: None at bedside ?Disposition Plan: Discharge back to SNF pending EP recommendations, SLP recommendations for ability to advance diet, transition to PO/per tube antibiotics. Anticipate discharge readiness in 1-2 days ? ? ?Consultants:  ?Cardiology ?Electrophysiology ?Palliative care medicine ? ?Procedures:  ?None ? ?Antimicrobials: ?Unasyn  ? ? ?Subjective: ?No concerns today. Patient reports "no shortness of breath yet."  ? ?Objective: ?BP 119/78 (BP Location: Right Arm)   Pulse (!) 48   Temp 98 ?F (36.7 ?C)   Resp 11   Ht 6\' 3"  (1.905 m)   Wt 84.7 kg   SpO2 100%   BMI 23.34 kg/m?  ? ?Examination: ? ?General exam: Appears calm and comfortable ?Respiratory system: Respiratory effort normal. ?Cardiovascular system: S1 & S2 heard, bradycardia. Normal rhythm. ?Gastrointestinal system: Abdomen is nondistended, soft and nontender. No organomegaly or masses felt. Normal bowel sounds heard. ?Central nervous system: Alert and oriented to self. Follows commands. ?Musculoskeletal: No edema. No calf tenderness ?Skin: No cyanosis. No rashes ? ? ?Data Reviewed: I have personally reviewed following labs and imaging studies ? ?CBC ?Lab Results  ?Component Value Date  ? WBC 3.9 (L) 10/26/2021  ? RBC 3.12 (L) 10/26/2021  ? HGB 9.3 (L) 10/26/2021  ? HCT 29.5 (L) 10/26/2021  ? MCV 94.6 10/26/2021  ? MCH 29.8 10/26/2021  ? PLT 140 (L) 10/26/2021  ? MCHC 31.5 10/26/2021  ? RDW 12.9 10/26/2021  ? LYMPHSABS 1.6 10/24/2021  ? MONOABS 0.5 10/24/2021  ? EOSABS 0.2 10/24/2021  ? BASOSABS 0.0 10/24/2021  ? ? ? ?Last metabolic panel ?Lab Results  ?Component Value Date  ? NA 140 10/26/2021  ? K 3.7 10/26/2021  ? CL 104  10/26/2021  ? CO2 26 10/26/2021  ? BUN 31 (H) 10/26/2021  ? CREATININE 1.36 (H) 10/26/2021  ? GLUCOSE 100 (H) 10/26/2021  ? GFRNONAA 54 (L)  10/26/2021  ? CALCIUM 9.1 10/26/2021  ? PHOS 3.5 04/25/2021  ? PROT 7.4 10/24/2021  ? ALBUMIN 3.2 (L) 10/24/2021  ? BILITOT 0.5 10/24/2021  ? ALKPHOS 76 10/24/2021  ? AST 20 10/24/2021  ? ALT 44 10/24/2021  ? ANIONGAP 10 10/26/2021  ? ? ?GFR: ?Estimated Creatinine Clearance: 56.1 mL/min (A) (by C-G formula based on SCr of 1.36 mg/dL (H)). ? ?No results found for this or any previous visit (from the past 240 hour(s)).  ? ? ?Radiology Studies: ?CT SOFT TISSUE NECK WO CONTRAST ? ?Result Date: 10/24/2021 ?CLINICAL DATA:  Dysphagia, foreign body kfc chicken aspiration EXAM: CT NECK WITHOUT CONTRAST TECHNIQUE: Multidetector CT imaging of the neck was performed following the standard protocol without intravenous contrast. RADIATION DOSE REDUCTION: This exam was performed according to the departmental dose-optimization program which includes automated exposure control, adjustment of the mA and/or kV according to patient size and/or use of iterative reconstruction technique. COMPARISON:  None. FINDINGS: Pharynx and larynx: There is some soft tissue thickening and debris at the level of the supraglottic larynx and adjacent pharynx. No radiopaque foreign body. Salivary glands: Parotid and submandibular glands unremarkable. Thyroid: Normal. Lymph nodes: No enlarged or abnormal density nodes. Vascular: Calcified plaque at the common carotid bifurcations. Limited intracranial: Parenchymal volume loss. No acute abnormality. Visualized orbits: Deformity of the medial right orbital wall may reflect prior trauma. Bilateral lens replacements. Mastoids and visualized paranasal sinuses: Chronic right maxillary sinusitis. Minimal right mastoid opacification. Skeleton: Advanced degenerative changes of the cervical spine. Interbody fusion at C5-C6. Degenerative changes of the right glenohumeral joint. Upper chest: Dictated separately. Other: None. IMPRESSION: Some soft tissue thickening and debris at the level of the supraglottic larynx and  adjacent pharynx may reflect retained secretions and wall edema. However, there is no radiopaque foreign body. Airway is patent. Electronically Signed   By: Macy Mis M.D.   On: 10/24/2021 21:17  ? ?CT CHEST WO CONTRAST ? ?Result Date: 10/24/2021 ?CLINICAL DATA:  Aspiration. EXAM: CT CHEST WITHOUT CONTRAST TECHNIQUE: Multidetector CT imaging of the chest was performed following the standard protocol without IV contrast. RADIATION DOSE REDUCTION: This exam was performed according to the departmental dose-optimization program which includes automated exposure control, adjustment of the mA and/or kV according to patient size and/or use of iterative reconstruction technique. COMPARISON:  March 20, 2021 FINDINGS: Cardiovascular: There is marked severity calcification of the thoracic aorta, without evidence of aortic aneurysm. There is mild cardiomegaly with marked severity coronary artery calcification. There is a small pericardial effusion. Mediastinum/Nodes: No enlarged mediastinal or axillary lymph nodes. Thyroid gland, trachea, and esophagus demonstrate no significant findings. Lungs/Pleura: Mild areas of right middle lobe and bilateral lobe atelectasis and/or infiltrate are seen, right greater than left. There is no evidence of a pleural effusion or pneumothorax. Upper Abdomen: No acute abnormality. Musculoskeletal: There is marked severity kyphosis of the upper thoracic spine. No acute osseous abnormalities are seen. IMPRESSION: 1. Mild right middle lobe and bilateral lobe atelectasis and/or infiltrate, right greater than left. 2. Small pericardial effusion. Aortic Atherosclerosis (ICD10-I70.0). Electronically Signed   By: Virgina Norfolk M.D.   On: 10/24/2021 21:11   ? ? ? LOS: 3 days  ? ? ?Cordelia Poche, MD ?[facility] ?10/26/2021, 2:10 PM ? ? ?If 7PM-7AM, please contact night-coverage ?[email or web address] ? ?

## 2021-10-26 NOTE — Progress Notes (Signed)
@  2155 Resumed TF at the rate of 22ml/hr, residual 85.  ?

## 2021-10-26 NOTE — Assessment & Plan Note (Signed)
Continue tube feeds.   

## 2021-10-26 NOTE — Assessment & Plan Note (Addendum)
Patient appears to have a prior history. Concern this current event could be related to initial hypothermia. Continues to have continued bradycardia. Asymptomatic. Heart rates as low as 30. Cardiology, Dr. Odis Hollingshead consulted and has recommended EP consult. EP recommend no further management and consideration of discontinuing telemetry. ?

## 2021-10-26 NOTE — Assessment & Plan Note (Addendum)
Patient currently resides at a nursing facility. Patient is s/p PEG tube. On Seroquel. Oriented to self and answers questions/follows commands. Patient follows with [facility] as an outpatient. Palliative care consulted this admission. Decision made for discharge home with hospice when medically stable for discharge. Continue Seroquel. ?

## 2021-10-26 NOTE — Progress Notes (Signed)
Speech Language Pathology Treatment: Dysphagia  ?Patient Details ?Name: Jack Rivas ?MRN: [id number] ?DOB: [birth year] ?Today's Date: 10/26/2021 ?Time: 1255-1310 ?SLP Time Calculation (min) (ACUTE ONLY): 15 min ? ?Assessment / Plan / Recommendation ?Clinical Impression ? Pt seen clinically today due to inability to conduct MBS until later pm - after 1500.  Pt today seen to address dysphagia goals - most notably his desire for po intake.  Pt continues with gurgly, wet voice without awareness - likely due to his sensory deficits and secretion aspiration.  Provided po trials including ice chips, nectar thick juice and applesauce to determine clinically what may be least risk pending MBS.   ? ?Pt continues with significant delay in swallow - up to 20 seconds with single ice chips.  No overt s/s of aspiration with nectar liquids via tsp, cup, straw x 9  boluses, however pt with reflexive cough after each of 2 applesauce boluses. With second applesauce bolus - oral retention of applesauce noted - that may have been propelled from pharynx.   ? ?Recommend pt have nectar liquid diet today - allowing ice chips with strict precautions.  Will plan to proceed with MBS tomorrow.  ? ?Unfortunately this kind pt continues with recurrent pnas - concerning for recurrent aspiration even when not observed on MBS.  His dementia, significant delay in swallow, weak cough and secretion aspiration contribute significantly to his chronic aspiration pneumonia risk. ? ?  ?HPI HPI: Pt is a 76 yo male residen to SNF with h/o dementia, recurrent aspiration pnas (dating back years - since in [place] when he had recurrent asp pnas), s/p PEG, 3 admits in the last six months with pna per review of chart.  Last MBS 08/30/2021 with recommendations for dys1/thin diet - delay in swallow, no aspiration, laryngeal penetration x1 with thin in larynx above vocal cords.   Per notes, pt was given [facility] by family and aspirated - prompting hospital admit.  Pt is familiar to  SLP from prior hospital admits and have spoken with daughter [name].  He also has h/o silent aspiration, gagging choking with intake at SNF from prior hospital notes. Pt remains full code with full treatment.  Speech/swallow evaluation ordered. ?  ?   ?SLP Plan ? Continue with current plan of care;MBS (MBS 4/13) ? ?  ?  ?Recommendations for follow up therapy are one component of a multi-disciplinary discharge planning process, led by the attending physician.  Recommendations may be updated based on patient status, additional functional criteria and insurance authorization. ?  ? ?Recommendations  ?Diet recommendations: Nectar-thick liquid;Other(comment) (nectar thick liquids, single ice chips) ?Liquids provided via: Teaspoon;Cup ?Medication Administration: Via alternative means ?Supervision: Patient able to self feed;Full supervision/cueing for compensatory strategies ?Compensations: Small sips/bites;Slow rate ?Postural Changes and/or Swallow Maneuvers: Seated upright 90 degrees;Upright 30-60 min after meal  ?   ?    ?   ? ? ? ? Oral Care Recommendations: Oral care prior to ice chip/H20 ?Follow Up Recommendations: Skilled nursing-short term rehab (<3 hours/day) ?Assistance recommended at discharge: Frequent or constant Supervision/Assistance ?SLP Visit Diagnosis: Dysphagia, oropharyngeal phase (R13.12) ?Plan: Continue with current plan of care;MBS (MBS 4/13) ? ? ? ? ?  ?  ? ? ?Chales Abrahams ? ?10/26/2021, 1:47 PM ?

## 2021-10-26 NOTE — Progress Notes (Signed)
G-tube residual of 2103ml. MD Nettey notified via secure chat. Awaiting response. ?

## 2021-10-26 NOTE — Assessment & Plan Note (Signed)
Stable

## 2021-10-26 NOTE — Consult Note (Signed)
? ?                                                                                ?Consultation Note ?Date: 10/26/2021  ? ?Patient Name: Jack Rivas  ?DOB: 10/01/1945  MRN: [id number]  Age / Sex: 76 y.o., male  ?PCP: Patient, No Pcp Per (Inactive) ?Referring Physician: Mariel Aloe, MD ? ?Reason for Consultation: Establishing goals of care ? ?HPI/Patient Profile: 76 y.o. male    admitted on 10/23/2021   ? ?Clinical Assessment and Goals of Care: ?76 year old gentleman with history of dementia, admitted with aspiration pneumonia, has chronic dysphagia and is status post PEG tube placement.  Hospital course complicated by significant asymptomatic bradycardia.  Has underlying stage III chronic kidney disease, anemia of chronic disease and dementia. ?Palliative services consulted for broad goals of care discussions.  Patient seen and examined.  He is awake alert sitting up in bed.  He asked for something to eat or drink.  He states that he is in the hospital for pneumonia. ?Palliative medicine is specialized medical care for people living with serious illness. It focuses on providing relief from the symptoms and stress of a serious illness. The goal is to improve quality of life for both the patient and the family. ?Goals of care: Broad aims of medical therapy in relation to the patient's values and preferences. Our aim is to provide medical care aimed at enabling patients to achieve the goals that matter most to them, given the circumstances of their particular medical situation and their constraints.  ?Discussed with the patient about his current hospitalization.  Discussed about following SLP's recommendations and starting with the safest possible diet.  Discussed about following aspiration precautions to what ever extent possible.  Call placed but unable to reach daughter Jack Rivas.  Palliative medicine team has seen this patient in the past.  As per previous discussions, preference has always been for continuation of  full code/full scope care in the past. ? ?NEXT OF KIN ?Daughter Jack Rivas ? ?SUMMARY OF RECOMMENDATIONS   ?Full code/full scope care ?Discussed with SLP, appreciate their input.  Patient to be started on nectar thick, modified barium swallow to be done on 10-27-2021. ?Continue current mode of care ?Patient already connected with outpatient palliative services at his nursing facility-[facility], recommend continuation of outpatient palliative support. ?No further palliative specific recommendations at this time, thank you for the consult. ? ?Code Status/Advance Care Planning: ?Full code ? ? ?Symptom Management:  ?  ? ?Palliative Prophylaxis:  ?Delirium Protocol ? ?Psycho-social/Spiritual:  ?Desire for further Chaplaincy support:yes ?Additional Recommendations: Caregiving  Support/Resources ? ?Prognosis:  ?Unable to determine ? ?Discharge Planning:  Back to [facility] health care facility with continuation of outpatient palliative services.   ? ?  ? ?Primary Diagnoses: ?Present on Admission: ? Aspiration pneumonia ([place]) ? Anemia of chronic disease ? Dementia ([place]) ? Malnutrition of moderate degree ? Bradycardia ? Chronic kidney disease, stage 3a ([place]) ? AKI (acute kidney injury) ([place]) ? ? ?I have reviewed the medical record, interviewed the patient and family, and examined the patient. The following aspects are pertinent. ? ?Past Medical History:  ?Diagnosis Date  ? Dementia ([place])   ?  Hypertension   ? Uses feeding tube   ? ?Social History  ? ?Socioeconomic History  ? Marital status: Single  ?  Spouse name: Not on file  ? Number of children: Not on file  ? Years of education: Not on file  ? Highest education level: Not on file  ?Occupational History  ? Not on file  ?Tobacco Use  ? Smoking status: Light Smoker  ? Smokeless tobacco: Never  ?Substance and Sexual Activity  ? Alcohol use: Yes  ? Drug use: Never  ? Sexual activity: Not on file  ?Other Topics Concern  ? Not on file  ?Social History Narrative  ? Not on  file  ? ?Social Determinants of Health  ? ?Financial Resource Strain: Not on file  ?Food Insecurity: Not on file  ?Transportation Needs: Not on file  ?Physical Activity: Not on file  ?Stress: Not on file  ?Social Connections: Not on file  ? ?History reviewed. No pertinent family history. ?Scheduled Meds: ? famotidine  20 mg Per Tube QHS  ? feeding supplement (PROSource TF)  45 mL Per Tube Daily  ? free water  200 mL Per Tube Q4H  ? QUEtiapine  25 mg Per Tube QHS  ? ?Continuous Infusions: ? ampicillin-sulbactam (UNASYN) IV 3 g (10/26/21 1253)  ? feeding supplement (JEVITY 1.5 CAL/FIBER) 1,000 mL (10/26/21 0507)  ? ?PRN Meds:.acetaminophen **OR** acetaminophen, food thickener ?Medications Prior to Admission:  ?Prior to Admission medications   ?Medication Sig Start Date End Date Taking? Authorizing Provider  ?famotidine (PEPCID) 20 MG tablet Place 20 mg into feeding tube at bedtime. 03/15/21  Yes [provider]  ?Nutritional Supplements (FEEDING SUPPLEMENT, JEVITY 1.5 CAL/FIBER,) LIQD Place 1,000 mLs into feeding tube continuous. ?Patient taking differently: 1,000 mLs by Per J Tube route continuous. In the Morning & Afternoon for Nutrition. Cal @ 70 ml/hr continuous hours X 14 hours/day (Start @ 1800, Stop @ 0800 via PEG (total 1000 ml formula X 24 hours) 04/26/21  Yes Dahal, Marlowe Aschoff, MD  ?polyethylene glycol powder (GLYCOLAX/MIRALAX) 17 GM/SCOOP powder Place 17 g into feeding tube See admin instructions. Mix 17 grams of powder as directed and give, per G-Tube, once a day 03/15/21  Yes [provider]  ?QUEtiapine (SEROQUEL) 25 MG tablet Place 1 tablet (25 mg total) into feeding tube at bedtime. 05/19/21  Yes Cherene Altes, MD  ?acetaminophen (TYLENOL) 160 MG/5ML solution Place 10.2 mLs (325 mg total) into feeding tube every 6 (six) hours as needed for mild pain, headache or fever. ?Patient not taking: Reported on 10/23/2021 05/19/21   Cherene Altes, MD  ?Nutritional Supplements (FEEDING  SUPPLEMENT, PROSOURCE TF,) liquid Place 45 mLs into feeding tube 2 (two) times daily. ?Patient not taking: Reported on 10/23/2021 04/26/21   Terrilee Croak, MD  ?Water For Irrigation, Sterile (FREE WATER) SOLN Place 200 mLs into feeding tube every 4 (four) hours. 05/19/21   Cherene Altes, MD  ? ?No Known Allergies ?Review of Systems ?Complains of feeling hungry ?Physical Exam ?Awake alert oriented has slightly slurred speech but is able to communicate and make needs known ?Regular work of breathing ?S1-S2 ?Abdomen is nondistended ? ?Vital Signs: BP (!) 151/76 (BP Location: Right Arm)   Pulse (!) 49   Temp 97.7 ?F (36.5 ?C) (Oral)   Resp 20   Ht 6\' 3"  (1.905 m)   Wt 84.7 kg   SpO2 100%   BMI 23.34 kg/m?  ?Pain Scale: 0-10 ?POSS *See Group Information*: 1-Acceptable,Awake and alert ?Pain Score: 0-No  pain ? ? ?SpO2: SpO2: 100 % ?O2 Device:SpO2: 100 % ?O2 Flow Rate: .O2 Flow Rate (L/min): 1 L/min ? ?IO: Intake/output summary:  ?Intake/Output Summary (Last 24 hours) at 10/26/2021 1449 ?Last data filed at 10/26/2021 0504 ?Gross per 24 hour  ?Intake 1498.9 ml  ?Output 675 ml  ?Net 823.9 ml  ? ? ?LBM:   ?Baseline Weight: Weight: 94.1 kg ?Most recent weight: Weight: 84.7 kg     ?Palliative Assessment/Data: ? ? ?  Palliative performance scale 50%. ? ?Time In: 1330 ?Time Out: 1430 ?Time Total: 60 ?Greater than 50%  of this time was spent counseling and coordinating care related to the above assessment and plan. ? ?Signed by: ?Loistine Chance, MD ?  ?Please contact Palliative Medicine Team phone at [phone] for questions and concerns.  ?For individual provider: See Amion ? ? ? ? ? ? ? ? ? ? ? ? ? ?

## 2021-10-26 NOTE — Assessment & Plan Note (Addendum)
Slight increase from baseline of about 1 with peak up to 1.36. Resolved. ?

## 2021-10-26 NOTE — Progress Notes (Signed)
Notified by telemetry that pt's HR dropped to 29, nonsustained. Pt's HR has varied from high 30's to low 50's. Notified J. Garner Nash, NP. Vitals taken, temp 97.5 oral, BP 126/64, 92% RA. ?Advised to add blankets to pt. 2 blankets added to pt.  ?    Pt's HR continued to bounce from low 30/s to 50's with HR eventually dropping to 20 and sustained for 1 minute. EKG ordered. Pt asleep during episodes of bradycardia and was arousable each time. Increased monitoring of telemetry by RN: noting bradycardia with pauses.  ?Will continue to monitor ?

## 2021-10-27 ENCOUNTER — Inpatient Hospital Stay (HOSPITAL_COMMUNITY): Payer: Medicare Other

## 2021-10-27 DIAGNOSIS — G9341 Metabolic encephalopathy: Secondary | ICD-10-CM | POA: Diagnosis not present

## 2021-10-27 DIAGNOSIS — Z515 Encounter for palliative care: Secondary | ICD-10-CM | POA: Diagnosis not present

## 2021-10-27 LAB — CBC
HCT: 32.8 % — ABNORMAL LOW (ref 39.0–52.0)
Hemoglobin: 10.6 g/dL — ABNORMAL LOW (ref 13.0–17.0)
MCH: 29.9 pg (ref 26.0–34.0)
MCHC: 32.3 g/dL (ref 30.0–36.0)
MCV: 92.4 fL (ref 80.0–100.0)
Platelets: 150 10*3/uL (ref 150–400)
RBC: 3.55 MIL/uL — ABNORMAL LOW (ref 4.22–5.81)
RDW: 12.6 % (ref 11.5–15.5)
WBC: 4.9 10*3/uL (ref 4.0–10.5)
nRBC: 0 % (ref 0.0–0.2)

## 2021-10-27 LAB — GLUCOSE, CAPILLARY
Glucose-Capillary: 102 mg/dL — ABNORMAL HIGH (ref 70–99)
Glucose-Capillary: 109 mg/dL — ABNORMAL HIGH (ref 70–99)
Glucose-Capillary: 134 mg/dL — ABNORMAL HIGH (ref 70–99)
Glucose-Capillary: 83 mg/dL (ref 70–99)
Glucose-Capillary: 87 mg/dL (ref 70–99)
Glucose-Capillary: 93 mg/dL (ref 70–99)

## 2021-10-27 LAB — BASIC METABOLIC PANEL
Anion gap: 10 (ref 5–15)
BUN: 29 mg/dL — ABNORMAL HIGH (ref 8–23)
CO2: 28 mmol/L (ref 22–32)
Calcium: 9.4 mg/dL (ref 8.9–10.3)
Chloride: 104 mmol/L (ref 98–111)
Creatinine, Ser: 0.89 mg/dL (ref 0.61–1.24)
GFR, Estimated: >60 mL/min (ref >60)
Glucose, Bld: 84 mg/dL (ref 70–99)
Potassium: 4.1 mmol/L (ref 3.5–5.1)
Sodium: 142 mmol/L (ref 135–145)

## 2021-10-27 MED ORDER — GLYCOPYRROLATE 1 MG PO TABS
1.0000 mg | ORAL_TABLET | Freq: Four times a day (QID) | ORAL | Status: DC | PRN
  Administered 2021-10-27 – 2021-10-28 (×2): 1 mg via ORAL
  Filled 2021-10-27 (×4): qty 1

## 2021-10-27 NOTE — Progress Notes (Signed)
Speech Language Pathology Treatment: Dysphagia  ?Patient Details ?Name: Jack Rivas ?MRN: [id number] ?DOB: 27-Apr-1946 ?Today's Date: 10/27/2021 ?Time: 9937-1696 ?SLP Time Calculation (min) (ACUTE ONLY): 10 min ? ?Assessment / Plan / Recommendation ?Clinical Impression ? SLP was documenting on another patient when pt heard to repeatedly cough and demonstrates wet gurgly voice.  He is unable to clear despite coughing. Initial SLP visit with oral suction cleared oral secretions but not pharyngeal.  Left room and within 2 mintues, pt with copius coughing, wet voice.  SlP went back into the room, cueing pt to cough and "hock" effectively removing a large amount of thin secretions *clear* that he was able to expel into posterior oral cavity.  Did not provide pt with po intake due to concern for overt secretion aspiration.  Messaged Md re: concern for secretions.  Advise staff if pt is coughing - to go into room and cue him to cough strongly and "hock" - orally suctioning posterior to aid clearance.  MD advised will place order for medication to help with secretions.  Discussed with NT pt's po diet and tube feeding to provide majority of nutrition.  Reviewed swallow test- delay in swallow to lower region of pharynx - indicating absolute need to sit fully upright with all po and assure pt swallows.  ?HPI HPI: Pt is a 76 yo male residen to SNF with h/o dementia, recurrent aspiration pnas (dating back years - since in [place] when he had recurrent asp pnas), s/p PEG, 3 admits in the last six months with pna per review of chart.  Last MBS 08/30/2021 with recommendations for dys1/thin diet - delay in swallow, no aspiration, laryngeal penetration x1 with thin in larynx above vocal cords.   Per notes, pt was given [facility] by family and aspirated - prompting hospital admit.  Pt is familiar to SLP from prior hospital admits and have spoken with daughter Jack Rivas.  He also has h/o silent aspiration, gagging choking with intake at SNF from prior  hospital notes. Pt remains full code with full treatment.  Speech/swallow evaluation ordered. ?  ?   ?SLP Plan ? Continue with current plan of care (MBS 4/13) ? ?  ?  ?Recommendations for follow up therapy are one component of a multi-disciplinary discharge planning process, led by the attending physician.  Recommendations may be updated based on patient status, additional functional criteria and insurance authorization. ?  ? ?Recommendations  ?Liquids provided via: Teaspoon;Cup ?Medication Administration: Via alternative means ?Supervision: Patient able to self feed;Full supervision/cueing for compensatory strategies ?Compensations: Small sips/bites;Slow rate ?Postural Changes and/or Swallow Maneuvers: Seated upright 90 degrees;Upright 30-60 min after meal  ?   ?    ?   ? ? ? ? Oral Care Recommendations: Oral care prior to ice chip/H20 ?Follow Up Recommendations: Skilled nursing-short term rehab (<3 hours/day) ?Assistance recommended at discharge: Frequent or constant Supervision/Assistance ?SLP Visit Diagnosis: Dysphagia, oropharyngeal phase (R13.12) ?Plan: Continue with current plan of care (MBS 4/13) ? ? ? ? ?  ? Jack Infante, MS CCC SLP ?Acute Rehab Services ?Office [phone] ?Pager [phone] ? ? ? ?Jack Rivas ? ?10/27/2021, 4:54 PM ?

## 2021-10-27 NOTE — Progress Notes (Signed)
? ?PROGRESS NOTE ? ? ? ?Jack Rivas  [id number] DOB: 1945/08/17 DOA: 10/23/2021 ?PCP: Patient, No Pcp Per (Inactive) ? ? ?Brief Narrative: ?Jack Rivas is a 76 y.o. male with a history of chronic dysphagia s/p PEG tube on tube feeds and dementia. Patient presented after an aspiration event as his nursing facility with resultant aspiration pneumonia. Antibiotics initiated. Patient also noticed to have significant bradycardia, although asymptomatic. Cardiology consulted with recommendation for conservative management. Plan for discharge home with hospice. ? ? ? ?Assessment and Plan: ?* Aspiration pneumonia (Jack Rivas) ?Aspiration event while at his facility. In setting of oral intake in addition to tube feeding. CT soft tissue of neck and CT chest significant for possible wall edema/retained secretions in addition to right middle/bilateral lower lobe infiltrate. Patient started on Unasyn IV for management of pneumonia. Speech therapy consulted and performed a modified barium swallow study on 4/13. ?-Continue Unasyn IV x5 days total ?-Dysphagia 1 diet, thin liquid via teaspoon ? ?Anemia of chronic disease ?Hemoglobin stable. ? ?Bradycardia ?Patient appears to have a prior history. Concern this current event could be related to initial hypothermia. Continues to have continued bradycardia. Asymptomatic. Heart rates as low as 30. Cardiology, Dr. Terri Rivas consulted and has recommended EP consult. EP recommend no further management and consideration of discontinuing telemetry. ? ?Chronic kidney disease, stage 3a (Jack Rivas) ?Stable. ? ?Malnutrition of moderate degree ?-Continue tube feeds ? ?Dysphagia ?Chronic. History of PEG tube on tube feeds and free water.  ?-Continue tube feeds ?-SLP recommendations (4/13): ? ?Nectar thick liquid;Dysphagia 1 (Puree) solids;Thin liquid (tsps of thin ok) ?  Liquid Administration via: Cup;No straw;Spoon (NO STRAW) ?  Medication Administration: Crushed with puree ? ?Dementia (Jack Rivas) ?Patient currently  resides at a nursing facility. Patient is s/p PEG tube. On Seroquel. Oriented to self and answers questions/follows commands. Patient follows with [facility] as an outpatient. Palliative care consulted this admission. Decision made for discharge home with hospice when medically stable for discharge. ?-Continue Seroquel ? ?AKI (acute kidney injury) (HCC)-resolved as of 10/27/2021 ?Slight increase from baseline of about 1 with peak up to 1.36. Resolved. ? ?Hypothermia-resolved as of 10/26/2021 ?Present on admission. Now resolved. ? ?Acute metabolic encephalopathy-resolved as of 10/27/2021 ?Secondary to infection. Appears to be back to baseline. ? ? ? ?DVT prophylaxis: SCDs ?Code Status:   Code Status: Full Code ?Family Communication: None at bedside ?Disposition Plan: Discharge home with hospice in 1 day pending completion of antibiotics and referral for home with hospice. ? ? ?Consultants:  ?Cardiology ?Electrophysiology ?Palliative care medicine ? ?Procedures:  ?None ? ?Antimicrobials: ?Unasyn  ? ? ?Subjective: ?Patient agitated today. Asks me to leave. Confusing me with someone that owes him money. ? ?Objective: ?BP 116/69 (BP Location: Left Arm)   Pulse (!) 59   Temp 97.6 ?F (36.4 ?C) (Oral)   Resp 20   Ht 6\' 3"  (1.905 m)   Wt 84.8 kg   SpO2 93%   BMI 23.37 kg/m?  ? ?Examination: ? ?General: No distress ? ? ?Data Reviewed: I have personally reviewed following labs and imaging studies ? ?CBC ?Lab Results  ?Component Value Date  ? WBC 4.9 10/27/2021  ? RBC 3.55 (L) 10/27/2021  ? HGB 10.6 (L) 10/27/2021  ? HCT 32.8 (L) 10/27/2021  ? MCV 92.4 10/27/2021  ? MCH 29.9 10/27/2021  ? PLT 150 10/27/2021  ? MCHC 32.3 10/27/2021  ? RDW 12.6 10/27/2021  ? LYMPHSABS 1.6 10/24/2021  ? MONOABS 0.5 10/24/2021  ? EOSABS 0.2 10/24/2021  ? BASOSABS  0.0 10/24/2021  ? ? ? ?Last metabolic panel ?Lab Results  ?Component Value Date  ? NA 142 10/27/2021  ? K 4.1 10/27/2021  ? CL 104 10/27/2021  ? CO2 28 10/27/2021  ?  BUN 29 (H) 10/27/2021  ? CREATININE 0.89 10/27/2021  ? GLUCOSE 84 10/27/2021  ? GFRNONAA >60 10/27/2021  ? CALCIUM 9.4 10/27/2021  ? PHOS 3.5 04/25/2021  ? PROT 7.4 10/24/2021  ? ALBUMIN 3.2 (L) 10/24/2021  ? BILITOT 0.5 10/24/2021  ? ALKPHOS 76 10/24/2021  ? AST 20 10/24/2021  ? ALT 44 10/24/2021  ? ANIONGAP 10 10/27/2021  ? ? ?GFR: ?Estimated Creatinine Clearance: 85.7 mL/min (by C-G formula based on SCr of 0.89 mg/dL). ? ?No results found for this or any previous visit (from the past 240 hour(s)).  ? ? ?Radiology Studies: ?DG Swallowing Func-Speech Pathology ? ?Result Date: 10/27/2021 ?Table formatting from the original result was not included. Objective Swallowing Evaluation: Type of Study: MBS-Modified Barium Swallow Study  Patient Details Name: Jack Rivas MRN: [id number] Date of Birth: 1945-10-10 Today's Date: 10/27/2021 Time: SLP Start Time (ACUTE ONLY): 0930 -SLP Stop Time (ACUTE ONLY): 0955 SLP Time Calculation (min) (ACUTE ONLY): 25 min Past Medical History: Past Medical History: Diagnosis Date  Dementia ([place])   Hypertension   Uses feeding tube  Past Surgical History: Past Surgical History: Procedure Laterality Date  CATARACT EXTRACTION, BILATERAL    KNEE SURGERY   HPI: Pt is a 76 yo male residen to SNF with h/o dementia, recurrent aspiration pnas (dating back years - since in [place] when he had recurrent asp pnas), s/p PEG, 3 admits in the last six months with pna per review of chart.  Last MBS 08/30/2021 with recommendations for dys1/thin diet - delay in swallow, no aspiration, laryngeal penetration x1 with thin in larynx above vocal cords.   Per notes, pt was given [facility] by family and aspirated - prompting hospital admit.  Pt is familiar to SLP from prior hospital admits and have spoken with daughter Jack Rivas.  He also has h/o silent aspiration, gagging choking with intake at SNF from prior hospital notes. Pt remains full code with full treatment.  Speech/swallow evaluation ordered.  Subjective: pt awake in bed   Recommendations for follow up therapy are one component of a multi-disciplinary discharge planning process, led by the attending physician.  Recommendations may be updated based on patient status, additional functional criteria and insurance authorization. Assessment / Plan / Recommendation   10/27/2021  10:02 AM Clinical Impressions Clinical Impression Patient presents with moderate oropharyngeal dysphagia c/b sensorimotor deficits - Impaired oral control results in premature spillage and intermittent oral retention.  Delay in pharyngeal swallow initation to pyriform sinus allows laryngeal penetration and aspiration of nectar mixed with secretions.  Secretion aspiration was chronic and without consistent cough response- suspect desensitized.   Inadequate mastication noted likely due to vertical pattern and with delayed swallow initiation as poorly masticated bolus spills to pyriform sinus - aspiration risk elevated.  Do not recommend solids requiring mastication due to recent chicken aspiration event.  Use of straw resulted in consistent penetration and minimal aspiration of nectar - small cup boluses protective of airway. Although pt did not aspirate with thin via tsp or cup, due to delay in swallow and velocity of liquid flow, thin via cup carries higher risk.  Recommend controlling bolus size for maximal protection. Pyriform sinus retention noted across all consistencies.  At this time, recommend pt return to puree/nectar diet = no straws-medications with puree. Recommend  pt be able to have tsps of thin water and small single ice chips. Despite pt not aspirating a large amount on this test, his weak volitional cough (when he does perform it per cue) and delay in swallow per research indicate he is elevated aspiration risk. SLP Visit Diagnosis Dysphagia, oropharyngeal phase (R13.12) Impact on safety and function Moderate aspiration risk     10/27/2021  10:02 AM Treatment Recommendations Treatment Recommendations  Therapy as outlined in treatment plan below     10/27/2021  10:28 AM Prognosis Prognosis for Safe Diet Advancement Fair Barriers to Reach Goals Time post onset;Cognitive deficits   10/27/2021  10:02 AM Diet Recommend

## 2021-10-27 NOTE — Progress Notes (Signed)
Patient daughter [name] called and updated. Patient daughter requesting that MD to give her a call. ?

## 2021-10-27 NOTE — Progress Notes (Signed)
SLP Cancellation Note ? ?Patient Details ?Name: Jack Rivas ?MRN: [id number] ?DOB: January 26, 1946 ? ? ?Cancelled treatment:       Reason Eval/Treat Not Completed: Other (comment) (per transporter for xray, pt was swinging at her and swearing, will continue efforts) ? ? ?Chales Abrahams ?10/27/2021, 8:52 AM ?Rolena Infante, MS [facility] SLP ?Acute Rehab Services ?Office [phone] ?Pager [phone] ? ?

## 2021-10-27 NOTE — Assessment & Plan Note (Signed)
Secondary to infection. Appears to be back to baseline. ?

## 2021-10-27 NOTE — Progress Notes (Incomplete)
Speech Language Pathology Treatment: Dysphagia  ?Patient Details ?Name: Jack Rivas ?MRN: [id number] ?DOB: December 26, 1945 ?Today's Date: 10/27/2021 ?Time: 0539-7673 ?SLP Time Calculation (min) (ACUTE ONLY): 10 min ? ?Assessment / Plan / Recommendation ?Clinical Impression ? SLP was documenting on another patient when pt heard to repeatedly cough and demonstrates wet gurgly voice.  He is unable to clear despite coughing. Initial SLP visit with oral suction cleared oral secretions but not pharyngeal.  Left room and within 2 mintues, pt with copius coughing, wet voice.  SlP went back into the room, cueing pt to cough and "hock" effectively removing a large amount of thin secretions *clear* that he was able to expel into posterior oral cavity.  Did not provide pt with po intake due to concern for overt secretion aspiration.  Messaged Md re: concern for secretions.  Advise staff if pt is coughing - to go into room and cue him to cough strongly and "hock" - orally suctioning posterior to aid clearance.  MD advised will place order for medication to help with secretions.  Discussed with NT pt's po diet and tube feeding to provide majority of nutrition.  Reviewed swallow test- delay in swallow to lower region of pharynx - indicating absolute need to sit fully upright with all po and assure pt swallows. Stop po if pt e ? ?  ?HPI HPI: Pt is a 76 yo male residen to SNF with h/o dementia, recurrent aspiration pnas (dating back years - since in [place] when he had recurrent asp pnas), s/p PEG, 3 admits in the last six months with pna per review of chart.  Last MBS 08/30/2021 with recommendations for dys1/thin diet - delay in swallow, no aspiration, laryngeal penetration x1 with thin in larynx above vocal cords.   Per notes, pt was given [facility] by family and aspirated - prompting hospital admit.  Pt is familiar to SLP from prior hospital admits and have spoken with daughter Jack Rivas.  He also has h/o silent aspiration, gagging choking with intake  at SNF from prior hospital notes. Pt remains full code with full treatment.  Speech/swallow evaluation ordered. ?  ?   ?SLP Plan ? Continue with current plan of care (MBS 4/13) ? ?  ?  ?Recommendations for follow up therapy are one component of a multi-disciplinary discharge planning process, led by the attending physician.  Recommendations may be updated based on patient status, additional functional criteria and insurance authorization. ?  ? ?Recommendations  ?Liquids provided via: Teaspoon;Cup ?Medication Administration: Via alternative means ?Supervision: Patient able to self feed;Full supervision/cueing for compensatory strategies ?Compensations: Small sips/bites;Slow rate ?Postural Changes and/or Swallow Maneuvers: Seated upright 90 degrees;Upright 30-60 min after meal  ?   ?    ?   ? ? ? ? Oral Care Recommendations: Oral care prior to ice chip/H20 ?Follow Up Recommendations: Skilled nursing-short term rehab (<3 hours/day) ?Assistance recommended at discharge: Frequent or constant Supervision/Assistance ?SLP Visit Diagnosis: Dysphagia, oropharyngeal phase (R13.12) ?Plan: Continue with current plan of care (MBS 4/13) ? ? ? ? ?  ?  ? ? ?Jack Rivas ? ?10/27/2021, 4:52 PM ?

## 2021-10-27 NOTE — Progress Notes (Signed)
? ?                                                                                                                                                     ?                                                   ?Daily Progress Note  ? ?Patient Name: Jack Rivas       Date: 10/27/2021 ?DOB: 29-May-1946  Age: 76 y.o. MRN#: [id number] ?Attending Physician: Mariel Aloe, MD ?Primary Care Physician: Patient, No Pcp Per (Inactive) ?Admit Date: 10/23/2021 ? ?Reason for Consultation/Follow-up: Establishing goals of care ? ?Subjective: ?Confused ? ?Length of Stay: 4 ? ?Current Medications: ?Scheduled Meds:  ? famotidine  20 mg Per Tube QHS  ? feeding supplement (PROSource TF)  45 mL Per Tube Daily  ? free water  200 mL Per Tube Q4H  ? QUEtiapine  25 mg Per Tube QHS  ? ? ?Continuous Infusions: ? ampicillin-sulbactam (UNASYN) IV 3 g (10/27/21 1001)  ? feeding supplement (JEVITY 1.5 CAL/FIBER) 1,000 mL (10/26/21 2155)  ? ? ?PRN Meds: ?acetaminophen **OR** acetaminophen, food thickener ? ?Physical Exam         ?Resting in bed ?No distress ?Regular work of breathing ? ?Vital Signs: BP 116/69 (BP Location: Left Arm)   Pulse (!) 59   Temp 97.6 ?F (36.4 ?C) (Oral)   Resp 20   Ht 6\' 3"  (1.905 m)   Wt 84.8 kg   SpO2 93%   BMI 23.37 kg/m?  ?SpO2: SpO2: 93 % ?O2 Device: O2 Device: Nasal Cannula ?O2 Flow Rate: O2 Flow Rate (L/min): 1 L/min ? ?Intake/output summary:  ?Intake/Output Summary (Last 24 hours) at 10/27/2021 1241 ?Last data filed at 10/27/2021 0500 ?Gross per 24 hour  ?Intake 2090 ml  ?Output 1600 ml  ?Net 490 ml  ? ?LBM: Last BM Date : 10/26/21 ?Baseline Weight: Weight: 94.1 kg ?Most recent weight: Weight: 84.8 kg ? ?     ?Palliative Assessment/Data: ? ? ? ? ? ?Patient Active Problem List  ? Diagnosis Date Noted  ? 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 Debility 10/25/2021  ? Hypoglycemia 10/25/2021  ? AKI (acute kidney injury) ([place]) 10/25/2021  ? Anemia of chronic disease 10/23/2021  ? HCAP (healthcare-associated pneumonia)  04/20/2021  ? Chronic kidney disease, stage 3a ([place]) 04/20/2021  ? Bradycardia 04/20/2021  ? Severe sepsis ([place]) 04/20/2021  ? Malnutrition of moderate degree 03/21/2021  ? Acute metabolic encephalopathy [id number]  ? Aspiration pneumonia ([place]) 03/20/2021  ? Dementia ([place]) 03/20/2021  ? Dysphagia 03/20/2021  ? ? ?Palliative Care Assessment & Plan  ? ?Patient Profile: ?  ? ?Assessment: ? 76 year old  gentleman with history of dementia, admitted with aspiration pneumonia, has chronic dysphagia and is status post PEG tube placement.  Hospital course complicated by significant asymptomatic bradycardia.  Has underlying stage III chronic kidney disease, anemia of chronic disease and dementia ? ?Recommendations/Plan: ?Full code for now ?Home with hospice, daughter does not want patient to go back to SNF. ?MBS results noted, appreciate SLP input.   ?  ? ?Code Status: ? ?  ?Code Status Orders  ?(From admission, onward)  ?  ? ? ?  ? ?  Start     Ordered  ? 10/23/21 2302  Full code  Continuous       ? 10/23/21 2301  ? ?  ?  ? ?  ? ?Code Status History   ? ? Date Active Date Inactive Code Status Order ID Comments User Context  ? 05/11/2021 0035 05/19/2021 1959 Full Code [id number]  Elodia Florence., MD ED  ? 04/20/2021 2235 04/27/2021 0139 Full Code [id number]  Annita Brod, MD Inpatient  ? 03/20/2021 0621 03/29/2021 0116 Full Code [id number]  Rolla Plate, DO Inpatient  ? ?  ? ? ?Prognosis: ? < 6 months ? ?Discharge Planning: ?Home with Hospice ? ?Care plan was discussed with  daughter on phone.  ? ?Thank you for allowing the Palliative Medicine Team to assist in the care of this patient. ? ? ?Time In: 12 Time Out: 12.25 Total Time 25 Prolonged Time Billed  no   ? ?   ?Greater than 50%  of this time was spent counseling and coordinating care related to the above assessment and plan. ? ?Loistine Chance, MD ? ?Please contact Palliative Medicine Team phone at [phone] for questions and concerns.  ? ? ? ? ? ?

## 2021-10-27 NOTE — Progress Notes (Signed)
Modified Barium Swallow Progress Note ? ?Patient Details  ?Name: Jack Rivas ?MRN: [id number] ?Date of Birth: [birth year] ? ?Today's Date: 10/27/2021 ? ?Modified Barium Swallow completed.  Full report located under Chart Review in the Imaging Section. ? ?Brief recommendations include the following: ? ?Clinical Impression ? Patient presents with moderate oropharyngeal dysphagia c/b sensorimotor deficits - Impaired oral control results in premature spillage and intermittent oral retention.  Delay in pharyngeal swallow initation to pyriform sinus allows laryngeal penetration and aspiration of nectar mixed with secretions.  Secretion aspiration was chronic and without consistent cough response- suspect desensitized.   Inadequate mastication noted likely due to vertical pattern and with delayed swallow initiation as poorly masticated bolus spills to pyriform sinus - aspiration risk elevated.  Do not recommend solids requiring mastication due to recent chicken aspiration event.  Use of straw resulted in consistent penetration and minimal aspiration of nectar - small cup boluses protective of airway. Although pt did not aspirate with thin via tsp or cup, due to delay in swallow and velocity of liquid flow, thin via cup carries higher risk.  Recommend controlling bolus size for maximal protection. Pyriform sinus retention noted across all consistencies.  At this time, recommend pt return to puree/nectar diet = no straws-medications with puree. Recommend pt be able to have tsps of thin water and small single ice chips. Despite pt not aspirating a large amount on this test, his weak volitional cough (when he does perform it per cue) and delay in swallow per research indicate he is elevated aspiration risk. ?  ?Swallow Evaluation Recommendations ? ?   ? ? SLP Diet Recommendations: Nectar thick liquid;Dysphagia 1 (Puree) solids;Thin liquid (tsps of thin ok) ? ? Liquid Administration via: Cup;No straw;Spoon (NO STRAW) ? ? Medication  Administration: Crushed with puree ? ?   ? ? Compensations: Small sips/bites;Slow rate ? ? Postural Changes: Remain semi-upright after after feeds/meals (Comment);Seated upright at 90 degrees ? ?   ? ? Other Recommendations: Have oral suction available ? ? ?Rolena Infante, MS CCC SLP ?Acute Rehab Services ?Office [phone] ?Pager [phone] ? ?Chales Abrahams ?10/27/2021,10:30 AM ?

## 2021-10-28 DIAGNOSIS — J690 Pneumonitis due to inhalation of food and vomit: Secondary | ICD-10-CM | POA: Diagnosis not present

## 2021-10-28 LAB — GLUCOSE, CAPILLARY
Glucose-Capillary: 102 mg/dL — ABNORMAL HIGH (ref 70–99)
Glucose-Capillary: 111 mg/dL — ABNORMAL HIGH (ref 70–99)
Glucose-Capillary: 89 mg/dL (ref 70–99)
Glucose-Capillary: 95 mg/dL (ref 70–99)
Glucose-Capillary: 96 mg/dL (ref 70–99)
Glucose-Capillary: 96 mg/dL (ref 70–99)

## 2021-10-28 LAB — METHYLMALONIC ACID, SERUM: Methylmalonic Acid, Quantitative: 163 nmol/L (ref 0–378)

## 2021-10-28 MED ORDER — AMOXICILLIN-POT CLAVULANATE 250-62.5 MG/5ML PO SUSR: 250.0000 mg | Freq: Two times a day (BID) | ORAL | Status: AC

## 2021-10-28 NOTE — TOC Progression Note (Signed)
Transition of Care (TOC) - Progression Note  ? ? ?Patient Details  ?Name: Jack Rivas ?MRN: [id number] ?Date of Birth: Jun 13, 1946 ? ?Transition of Care (TOC) CM/SW Contact  ?Geni Bers, RN ?Phone Number: ?10/28/2021, 11:55 AM ? ?Clinical Narrative:    ?Called daughter to confirm that pt will return home. Waiting for a return call.  ? ? ?Expected Discharge Plan: Skilled Nursing Facility ?Barriers to Discharge: Continued Medical Work up ? ?Expected Discharge Plan and Services ?Expected Discharge Plan: Skilled Nursing Facility ?  ?  ?  ?Living arrangements for the past 2 months: Skilled Nursing Facility ?                ?  ?  ?  ?  ?  ?  ?  ?  ?  ?  ? ? ?Social Determinants of Health (SDOH) Interventions ?  ? ?Readmission Risk Interventions ? ?  03/28/2021  ?  3:34 PM  ?Readmission Risk Prevention Plan  ?Transportation Screening Complete  ?PCP or Specialist Appt within 3-5 Days Complete  ?[place] or Home Care Consult Complete  ?Social Work Consult for [place] Care Planning/Counseling Complete  ?Palliative Care Screening Not Applicable  ?Medication Review Oceanographer) Complete  ? ? ?

## 2021-10-28 NOTE — TOC Progression Note (Signed)
Transition of Care (TOC) - Progression Note  ? ? ?Patient Details  ?Name: [name] ?MRN: [id number] ?Date of Birth: 1946/02/06 ? ?Transition of Care (TOC) CM/SW Contact  ?Purcell Mouton, RN ?Phone Number: ?10/28/2021, 4:01 PM ? ?Clinical Narrative:    ?PTAR was called, daughter [name], and RN aware.  ? ? ?Expected Discharge Plan: [place] ?Barriers to Discharge: Continued Medical Work up ? ?Expected Discharge Plan and Services ?Expected Discharge Plan: [place] ?  ?  ?  ?Living arrangements for the past 2 months: [place] ?Expected Discharge Date: 10/28/21               ?  ?  ?  ?  ?  ?  ?  ?  ?  ?  ? ? ?Social Determinants of Health (SDOH) Interventions ?  ? ?Readmission Risk Interventions ? ?  03/28/2021  ?  3:34 PM  ?Readmission Risk Prevention Plan  ?Transportation Screening Complete  ?PCP or Specialist Appt within 3-5 Days Complete  ?[place] or Home Care Consult Complete  ?Social Work Consult for [place] Planning/Counseling Complete  ?Palliative Care Screening Not Applicable  ?Medication Review Press photographer) Complete  ? ? ?

## 2021-10-28 NOTE — TOC Progression Note (Signed)
Transition of Care (TOC) - Progression Note  ? ? ?Patient Details  ?Name: Jack Rivas ?MRN: [id number] ?Date of Birth: 03/30/1946 ? ?Transition of Care (TOC) CM/SW Contact  ?Purcell Mouton, RN ?Phone Number: ?10/28/2021, 3:35 PM ? ?Clinical Narrative:    ?Spoke with pt's daughter [name] concerning pt's discharge. [name] asked that pt go back to [facility], then she will take pt to her home at the end of the month. Will need discharge summary. [facility] states they will take pt back if it is before 2200.  ? ? ?Expected Discharge Plan: [place] ?Barriers to Discharge: Continued Medical Work up ? ?Expected Discharge Plan and Services ?Expected Discharge Plan: [place] ?  ?  ?  ?Living arrangements for the past 2 months: [place] ?                ?  ?  ?  ?  ?  ?  ?  ?  ?  ?  ? ? ?Social Determinants of Health (SDOH) Interventions ?  ? ?Readmission Risk Interventions ? ?  03/28/2021  ?  3:34 PM  ?Readmission Risk Prevention Plan  ?Transportation Screening Complete  ?PCP or Specialist Appt within 3-5 Days Complete  ?[place] or Home Care Consult Complete  ?Social Work Consult for [place] Planning/Counseling Complete  ?Palliative Care Screening Not Applicable  ?Medication Review Press photographer) Complete  ? ? ?

## 2021-10-28 NOTE — Progress Notes (Signed)
NTS performed with moderate tan secretions obtained, with no complications. ?

## 2021-10-28 NOTE — Plan of Care (Signed)

## 2021-10-28 NOTE — Progress Notes (Signed)
? ?PROGRESS NOTE ? ? ? ?Jack Rivas  [id number] DOB: [birth year] DOA: 10/23/2021 ?PCP: Patient, No Pcp Per (Inactive) ? ? ?Brief Narrative: ?Jack Rivas is a 76 y.o. male with a history of chronic dysphagia s/p PEG tube on tube feeds and dementia. Patient presented after an aspiration event as his nursing facility with resultant aspiration pneumonia. Antibiotics initiated. Patient also noticed to have significant bradycardia, although asymptomatic. Cardiology consulted with recommendation for conservative management. Plan for discharge home with hospice. ? ? ? ?Assessment and Plan: ?* Aspiration pneumonia ([place]) ?Aspiration event while at his facility. In setting of oral intake in addition to tube feeding. CT soft tissue of neck and CT chest significant for possible wall edema/retained secretions in addition to right middle/bilateral lower lobe infiltrate. Patient started on Unasyn IV for management of pneumonia. Speech therapy consulted and performed a modified barium swallow study on 4/13. ?-Continue Unasyn IV x5 days total ?-Dysphagia 1 diet, thin liquid via teaspoon ? ?Anemia of chronic disease ?Hemoglobin stable. ? ?Bradycardia ?Patient appears to have a prior history. Concern this current event could be related to initial hypothermia. Continues to have continued bradycardia. Asymptomatic. Heart rates as low as 30. Cardiology, Dr. Terri Rivas consulted and has recommended EP consult. EP recommend no further management and consideration of discontinuing telemetry. ? ?Chronic kidney disease, stage 3a ([place]) ?Stable. ? ?Malnutrition of moderate degree ?-Continue tube feeds ? ?Dysphagia ?Chronic. History of PEG tube on tube feeds and free water.  ?-Continue tube feeds ?-SLP recommendations (4/13): ? ?Nectar thick liquid;Dysphagia 1 (Puree) solids;Thin liquid (tsps of thin ok) ?  Liquid Administration via: Cup;No straw;Spoon (NO STRAW) ?  Medication Administration: Crushed with puree ? ?Dementia ([place]) ?Patient currently  resides at a nursing facility. Patient is s/p PEG tube. On Seroquel. Oriented to self and answers questions/follows commands. Patient follows with [facility] as an outpatient. Palliative care consulted this admission. Decision made for discharge home with hospice when medically stable for discharge. ?-Continue Seroquel ? ?AKI (acute kidney injury) (HCC)-resolved as of 10/27/2021 ?Slight increase from baseline of about 1 with peak up to 1.36. Resolved. ? ?Hypothermia-resolved as of 10/26/2021 ?Present on admission. Now resolved. ? ?Acute metabolic encephalopathy-resolved as of 10/27/2021 ?Secondary to infection. Appears to be back to baseline. ? ? ? ?DVT prophylaxis: SCDs ?Code Status:   Code Status: Full Code ?Family Communication: None at bedside ?Disposition Plan: Discharge home with hospice pending confirmation from family. Otherwise, can pursue back to SNF with hospice. ? ? ?Consultants:  ?Cardiology ?Electrophysiology ?Palliative care medicine ? ?Procedures:  ?None ? ?Antimicrobials: ?Unasyn  ? ? ?Subjective: ?No issues this morning. ? ?Objective: ?BP 114/66 (BP Location: Right Arm)   Pulse (!) 56   Temp 98 ?F (36.7 ?C) (Oral)   Resp 16   Ht 6\' 3"  (1.905 m)   Wt 85 kg   SpO2 100%   BMI 23.42 kg/m?  ? ?Examination: ? ?General: No distress ? ? ?Data Reviewed: I have personally reviewed following labs and imaging studies ? ?CBC ?Lab Results  ?Component Value Date  ? WBC 4.9 10/27/2021  ? RBC 3.55 (L) 10/27/2021  ? HGB 10.6 (L) 10/27/2021  ? HCT 32.8 (L) 10/27/2021  ? MCV 92.4 10/27/2021  ? MCH 29.9 10/27/2021  ? PLT 150 10/27/2021  ? MCHC 32.3 10/27/2021  ? RDW 12.6 10/27/2021  ? LYMPHSABS 1.6 10/24/2021  ? MONOABS 0.5 10/24/2021  ? EOSABS 0.2 10/24/2021  ? BASOSABS 0.0 10/24/2021  ? ? ? ?Last metabolic panel ?Lab Results  ?  Component Value Date  ? NA 142 10/27/2021  ? K 4.1 10/27/2021  ? CL 104 10/27/2021  ? CO2 28 10/27/2021  ? BUN 29 (H) 10/27/2021  ? CREATININE 0.89 10/27/2021  ? GLUCOSE 84  10/27/2021  ? GFRNONAA >60 10/27/2021  ? CALCIUM 9.4 10/27/2021  ? PHOS 3.5 04/25/2021  ? PROT 7.4 10/24/2021  ? ALBUMIN 3.2 (L) 10/24/2021  ? BILITOT 0.5 10/24/2021  ? ALKPHOS 76 10/24/2021  ? AST 20 10/24/2021  ? ALT 44 10/24/2021  ? ANIONGAP 10 10/27/2021  ? ? ?GFR: ?Estimated Creatinine Clearance: 85.7 mL/min (by C-G formula based on SCr of 0.89 mg/dL). ? ?No results found for this or any previous visit (from the past 240 hour(s)).  ? ? ?Radiology Studies: ?DG Swallowing Func-Speech Pathology ? ?Result Date: 10/27/2021 ?Table formatting from the original result was not included. Objective Swallowing Evaluation: Type of Study: MBS-Modified Barium Swallow Study  Patient Details Name: Jack Rivas MRN: [id number] Date of Birth: 08-Aug-1945 Today's Date: 10/27/2021 Time: SLP Start Time (ACUTE ONLY): 0930 -SLP Stop Time (ACUTE ONLY): 0955 SLP Time Calculation (min) (ACUTE ONLY): 25 min Past Medical History: Past Medical History: Diagnosis Date  Dementia ([place])   Hypertension   Uses feeding tube  Past Surgical History: Past Surgical History: Procedure Laterality Date  CATARACT EXTRACTION, BILATERAL    KNEE SURGERY   HPI: Pt is a 76 yo male residen to SNF with h/o dementia, recurrent aspiration pnas (dating back years - since in [place] when he had recurrent asp pnas), s/p PEG, 3 admits in the last six months with pna per review of chart.  Last MBS 08/30/2021 with recommendations for dys1/thin diet - delay in swallow, no aspiration, laryngeal penetration x1 with thin in larynx above vocal cords.   Per notes, pt was given [facility] by family and aspirated - prompting hospital admit.  Pt is familiar to SLP from prior hospital admits and have spoken with daughter Jack Rivas.  He also has h/o silent aspiration, gagging choking with intake at SNF from prior hospital notes. Pt remains full code with full treatment.  Speech/swallow evaluation ordered.  Subjective: pt awake in bed  Recommendations for follow up therapy are one component of a  multi-disciplinary discharge planning process, led by the attending physician.  Recommendations may be updated based on patient status, additional functional criteria and insurance authorization. Assessment / Plan / Recommendation   10/27/2021  10:02 AM Clinical Impressions Clinical Impression Patient presents with moderate oropharyngeal dysphagia c/b sensorimotor deficits - Impaired oral control results in premature spillage and intermittent oral retention.  Delay in pharyngeal swallow initation to pyriform sinus allows laryngeal penetration and aspiration of nectar mixed with secretions.  Secretion aspiration was chronic and without consistent cough response- suspect desensitized.   Inadequate mastication noted likely due to vertical pattern and with delayed swallow initiation as poorly masticated bolus spills to pyriform sinus - aspiration risk elevated.  Do not recommend solids requiring mastication due to recent chicken aspiration event.  Use of straw resulted in consistent penetration and minimal aspiration of nectar - small cup boluses protective of airway. Although pt did not aspirate with thin via tsp or cup, due to delay in swallow and velocity of liquid flow, thin via cup carries higher risk.  Recommend controlling bolus size for maximal protection. Pyriform sinus retention noted across all consistencies.  At this time, recommend pt return to puree/nectar diet = no straws-medications with puree. Recommend pt be able to have tsps of thin water and small single  ice chips. Despite pt not aspirating a large amount on this test, his weak volitional cough (when he does perform it per cue) and delay in swallow per research indicate he is elevated aspiration risk. SLP Visit Diagnosis Dysphagia, oropharyngeal phase (R13.12) Impact on safety and function Moderate aspiration risk     10/27/2021  10:02 AM Treatment Recommendations Treatment Recommendations Therapy as outlined in treatment plan below     10/27/2021   10:28 AM Prognosis Prognosis for Safe Diet Advancement Fair Barriers to Reach Goals Time post onset;Cognitive deficits   10/27/2021  10:02 AM Diet Recommendations SLP Diet Recommendations Nectar thick liquid;Dysphagia 1

## 2021-10-28 NOTE — Plan of Care (Signed)
?  Problem: Education: ?Goal: Knowledge of General Education information will improve ?Description: Including pain rating scale, medication(s)/side effects and non-pharmacologic comfort measures ?10/28/2021 1617 by Carney Harder, RN ?Outcome: Adequate for Discharge ?10/28/2021 1613 by Carney Harder, RN ?Outcome: Adequate for Discharge ?  ?Problem: Health Behavior/Discharge Planning: ?Goal: Ability to manage health-related needs will improve ?10/28/2021 1617 by Carney Harder, RN ?Outcome: Adequate for Discharge ?10/28/2021 1613 by Carney Harder, RN ?Outcome: Adequate for Discharge ?  ?Problem: Clinical Measurements: ?Goal: Ability to maintain clinical measurements within normal limits will improve ?10/28/2021 1617 by Carney Harder, RN ?Outcome: Adequate for Discharge ?10/28/2021 1613 by Carney Harder, RN ?Outcome: Adequate for Discharge ?Goal: Will remain free from infection ?10/28/2021 1617 by Carney Harder, RN ?Outcome: Adequate for Discharge ?10/28/2021 1613 by Carney Harder, RN ?Outcome: Adequate for Discharge ?Goal: Diagnostic test results will improve ?10/28/2021 1617 by Carney Harder, RN ?Outcome: Adequate for Discharge ?10/28/2021 1613 by Carney Harder, RN ?Outcome: Adequate for Discharge ?Goal: Respiratory complications will improve ?10/28/2021 1617 by Carney Harder, RN ?Outcome: Adequate for Discharge ?10/28/2021 1613 by Carney Harder, RN ?Outcome: Adequate for Discharge ?Goal: Cardiovascular complication will be avoided ?10/28/2021 1617 by Carney Harder, RN ?Outcome: Adequate for Discharge ?10/28/2021 1613 by Carney Harder, RN ?Outcome: Adequate for Discharge ?  ?Problem: Activity: ?Goal: Risk for activity intolerance will decrease ?10/28/2021 1617 by Carney Harder, RN ?Outcome: Adequate for Discharge ?10/28/2021 1613 by Carney Harder, RN ?Outcome: Adequate for Discharge ?  ?Problem: Nutrition: ?Goal: Adequate nutrition will be maintained ?10/28/2021 1617 by Carney Harder,  RN ?Outcome: Adequate for Discharge ?10/28/2021 1613 by Carney Harder, RN ?Outcome: Adequate for Discharge ?  ?Problem: Coping: ?Goal: Level of anxiety will decrease ?10/28/2021 1617 by Carney Harder, RN ?Outcome: Adequate for Discharge ?10/28/2021 1613 by Carney Harder, RN ?Outcome: Adequate for Discharge ?  ?Problem: Elimination: ?Goal: Will not experience complications related to bowel motility ?10/28/2021 1617 by Carney Harder, RN ?Outcome: Adequate for Discharge ?10/28/2021 1613 by Carney Harder, RN ?Outcome: Adequate for Discharge ?Goal: Will not experience complications related to urinary retention ?10/28/2021 1617 by Carney Harder, RN ?Outcome: Adequate for Discharge ?10/28/2021 1613 by Carney Harder, RN ?Outcome: Adequate for Discharge ?  ?Problem: Pain Managment: ?Goal: General experience of comfort will improve ?10/28/2021 1617 by Carney Harder, RN ?Outcome: Adequate for Discharge ?10/28/2021 1613 by Carney Harder, RN ?Outcome: Adequate for Discharge ?  ?Problem: Safety: ?Goal: Ability to remain free from injury will improve ?10/28/2021 1617 by Carney Harder, RN ?Outcome: Adequate for Discharge ?10/28/2021 1613 by Carney Harder, RN ?Outcome: Adequate for Discharge ?  ?Problem: Skin Integrity: ?Goal: Risk for impaired skin integrity will decrease ?10/28/2021 1617 by Carney Harder, RN ?Outcome: Adequate for Discharge ?10/28/2021 1613 by Carney Harder, RN ?Outcome: Adequate for Discharge ?  ?Problem: Safety: ?Goal: Non-violent Restraint(s) ?10/28/2021 1617 by Carney Harder, RN ?Outcome: Adequate for Discharge ?10/28/2021 1613 by Carney Harder, RN ?Outcome: Adequate for Discharge ?  ?

## 2021-10-28 NOTE — Discharge Summary (Signed)
?Physician Discharge Summary ?  ?Patient: Jack Rivas MRN: [id number] DOB: Oct 14, 1945  ?Admit date:     10/23/2021  ?Discharge date: 10/28/21  ?Discharge Physician: Cordelia Poche, MD  ? ?PCP: Patient, No Pcp Per (Inactive)  ? ?Recommendations at discharge:  ? ?Referral to hospice; prognosis < 6 months ? ?Discharge Diagnoses: ?Principal Problem: ?  Aspiration pneumonia ([place]) ?Active Problems: ?  Dementia ([place]) ?  Dysphagia ?  Malnutrition of moderate degree ?  Chronic kidney disease, stage 3a ([place]) ?  Bradycardia ?  Anemia of chronic disease ?  Junctional bradycardia ?  Debility ?  Hypoglycemia ? ?Resolved Problems: ?  Acute metabolic encephalopathy ?  Hypothermia ?  AKI (acute kidney injury) ([place]) ? ?Hospital Course: ?[name] is a 76 y.o. male with a history of chronic dysphagia s/p PEG tube on tube feeds and dementia. Patient presented after an aspiration event as his nursing facility with resultant aspiration pneumonia. Antibiotics initiated. Patient also noticed to have significant bradycardia, although asymptomatic. Cardiology consulted with recommendation for conservative management. Plan for discharge home with hospice. ? ?Assessment and Plan: ?* Aspiration pneumonia ([place]) ?Aspiration event while at his facility. In setting of oral intake in addition to tube feeding. CT soft tissue of neck and CT chest significant for possible wall edema/retained secretions in addition to right middle/bilateral lower lobe infiltrate. Patient started on Unasyn IV for management of pneumonia. Speech therapy consulted and performed a modified barium swallow study on 4/13. Patient completed 4.5 days of antibiotics inpatient. Discharge with one dose of Augmentin to complete 5 day course. Diet as mentioned in problem, Dysphagia. ? ?Anemia of chronic disease ?Hemoglobin stable. ? ?Bradycardia ?Patient appears to have a prior history. Concern this current event could be related to initial hypothermia. Continues to have continued  bradycardia. Asymptomatic. Heart rates as low as 30. Cardiology, Dr. Terri Skains consulted and has recommended EP consult. EP recommend no further management and consideration of discontinuing telemetry. ? ?Chronic kidney disease, stage 3a ([place]) ?Stable. ? ?Malnutrition of moderate degree ?-Continue tube feeds ? ?Dysphagia ?Chronic. History of PEG tube on tube feeds and free water. Continue tube feeds. Speech therapy evaluated and recommend change in oral diet: ? ?Nectar thick liquid;Dysphagia 1 (Puree) solids;Thin liquid (tsps of thin ok) ?Liquid Administration via: Cup;No straw;Spoon (NO STRAW) ?Medication Administration: Crushed with puree ? ?Dementia ([place]) ?Patient currently resides at a nursing facility. Patient is s/p PEG tube. On Seroquel. Oriented to self and answers questions/follows commands. Patient follows with [facility] as an outpatient. Palliative care consulted this admission. Decision made for discharge home with hospice when medically stable for discharge. Continue Seroquel. ? ?AKI (acute kidney injury) ([facility])-resolved as of 10/27/2021 ?Slight increase from baseline of about 1 with peak up to 1.36. Resolved. ? ?Hypothermia-resolved as of 10/26/2021 ?Present on admission. Now resolved. ? ?Acute metabolic encephalopathy-resolved as of 10/27/2021 ?Secondary to infection. Appears to be back to baseline. ? ? ? ? ?  ? ? ?Consultants: Palliative care medicine ?Procedures performed: None  ?Disposition: Skilled nursing facility ?Diet recommendation:  ? ?Nectar thick liquid;Dysphagia 1 (Puree) solids;Thin liquid (tsps of thin ok) ?Liquid Administration via: Cup;No straw;Spoon (NO STRAW) ?Medication Administration: Crushed with puree ? ? ?DISCHARGE MEDICATION: ?Allergies as of 10/28/2021   ?No Known Allergies ?  ? ?  ?Medication List  ?  ? ?TAKE these medications   ? ?acetaminophen 160 MG/5ML solution ?Commonly known as: TYLENOL ?Place 10.2 mLs (325 mg total) into feeding tube every 6 (six) hours as  needed for  mild pain, headache or fever. ?  ?amoxicillin-clavulanate 250-62.5 MG/5ML suspension ?Commonly known as: AUGMENTIN ?Place 5 mLs (250 mg total) into feeding tube 2 (two) times daily for 1 dose. ?  ?famotidine 20 MG tablet ?Commonly known as: PEPCID ?Place 20 mg into feeding tube at bedtime. ?  ?feeding supplement (JEVITY 1.5 CAL/FIBER) Liqd ?Place 1,000 mLs into feeding tube continuous. ?What changed:  ?how to take this ?additional instructions ?  ?feeding supplement (PROSource TF) liquid ?Place 45 mLs into feeding tube 2 (two) times daily. ?What changed: Another medication with the same name was changed. Make sure you understand how and when to take each. ?  ?free water Soln ?Place 200 mLs into feeding tube every 4 (four) hours. ?  ?polyethylene glycol powder 17 GM/SCOOP powder ?Commonly known as: GLYCOLAX/MIRALAX ?Place 17 g into feeding tube See admin instructions. Mix 17 grams of powder as directed and give, per G-Tube, once a day ?  ?QUEtiapine 25 MG tablet ?Commonly known as: SEROQUEL ?Place 1 tablet (25 mg total) into feeding tube at bedtime. ?  ? ?  ? ? ?Discharge Exam: ?Filed Weights  ? 10/26/21 0500 10/27/21 0500 10/28/21 0500  ?Weight: 84.7 kg 84.8 kg 85 kg  ? ?General: No distress ? ?Condition at discharge: stable ? ?The results of significant diagnostics from this hospitalization (including imaging, microbiology, ancillary and laboratory) are listed below for reference.  ? ?Imaging Studies: ?DG Chest 2 View ? ?Result Date: 10/23/2021 ?CLINICAL DATA:  evaluate for aspiration PNA EXAM: CHEST - 2 VIEW.  Patient is slightly rotated on frontal view. COMPARISON:  Chest x-ray 05/10/2021 FINDINGS: The heart and mediastinal contours are unchanged. Aortic calcification. Developing patchy airspace opacity within the right lower lobe. No pulmonary edema. No pleural effusion. No pneumothorax. No acute osseous abnormality.  Left humeral head surgical hardware. IMPRESSION: Developing patchy airspace opacity  within the right lower lobe. Finding consistent with infection/inflammation. Followup PA and lateral chest X-ray is recommended in 3-4 weeks following therapy to ensure resolution. Electronically Signed   By: Iven Finn M.D.   On: 10/23/2021 20:56  ? ?CT SOFT TISSUE NECK WO CONTRAST ? ?Result Date: 10/24/2021 ?CLINICAL DATA:  Dysphagia, foreign body kfc chicken aspiration EXAM: CT NECK WITHOUT CONTRAST TECHNIQUE: Multidetector CT imaging of the neck was performed following the standard protocol without intravenous contrast. RADIATION DOSE REDUCTION: This exam was performed according to the departmental dose-optimization program which includes automated exposure control, adjustment of the mA and/or kV according to patient size and/or use of iterative reconstruction technique. COMPARISON:  None. FINDINGS: Pharynx and larynx: There is some soft tissue thickening and debris at the level of the supraglottic larynx and adjacent pharynx. No radiopaque foreign body. Salivary glands: Parotid and submandibular glands unremarkable. Thyroid: Normal. Lymph nodes: No enlarged or abnormal density nodes. Vascular: Calcified plaque at the common carotid bifurcations. Limited intracranial: Parenchymal volume loss. No acute abnormality. Visualized orbits: Deformity of the medial right orbital wall may reflect prior trauma. Bilateral lens replacements. Mastoids and visualized paranasal sinuses: Chronic right maxillary sinusitis. Minimal right mastoid opacification. Skeleton: Advanced degenerative changes of the cervical spine. Interbody fusion at C5-C6. Degenerative changes of the right glenohumeral joint. Upper chest: Dictated separately. Other: None. IMPRESSION: Some soft tissue thickening and debris at the level of the supraglottic larynx and adjacent pharynx may reflect retained secretions and wall edema. However, there is no radiopaque foreign body. Airway is patent. Electronically Signed   By: Macy Mis M.D.   On:  10/24/2021 21:17  ? ?  CT CHEST WO CONTRAST ? ?Result Date: 10/24/2021 ?CLINICAL DATA:  Aspiration. EXAM: CT CHEST WITHOUT CONTRAST TECHNIQUE: Multidetector CT imaging of the chest was performed following the standard p

## 2021-12-30 ENCOUNTER — Other Ambulatory Visit: Payer: Self-pay

## 2021-12-30 ENCOUNTER — Emergency Department (HOSPITAL_COMMUNITY)
Admission: EM | Admit: 2021-12-30 | Discharge: 2021-12-30 | Disposition: A | Discharge status: Home / Self Care | Payer: Medicare Other | Attending: Emergency Medicine | Admitting: Emergency Medicine

## 2021-12-30 ENCOUNTER — Emergency Department (HOSPITAL_COMMUNITY): Payer: Medicare Other

## 2021-12-30 ENCOUNTER — Encounter (HOSPITAL_COMMUNITY): Payer: Self-pay

## 2021-12-30 DIAGNOSIS — K9423 Gastrostomy malfunction: Secondary | ICD-10-CM | POA: Insufficient documentation

## 2021-12-30 DIAGNOSIS — F0390 Unspecified dementia without behavioral disturbance: Secondary | ICD-10-CM | POA: Insufficient documentation

## 2021-12-30 DIAGNOSIS — T85528A Displacement of other gastrointestinal prosthetic devices, implants and grafts, initial encounter: Secondary | ICD-10-CM

## 2021-12-30 MED ORDER — IOHEXOL 350 MG/ML SOLN
30.0000 mL | Freq: Once | INTRAVENOUS | Status: AC | PRN
  Administered 2021-12-30: 30 mL

## 2021-12-30 NOTE — ED Notes (Signed)
PTAR at bedside 

## 2021-12-30 NOTE — ED Triage Notes (Signed)
Pt BIB EMS from [facility] after pulling out g tube

## 2021-12-30 NOTE — ED Provider Notes (Signed)
[facility]-EMERGENCY DEPT Provider Note   CSN: [id number] Arrival date & time: 12/30/21  1601     History  No chief complaint on file.   Jack Rivas is a 76 y.o. male.  HPI  76 year old male with past medical history of dementia, chronic G-tube presents to the emergency department after G-tube "got pulled out".  Report from facility is that this happened earlier today at 2 PM.  G-tube is noted to be mature.  The facility does not have any documentation of the typical size of G-tube that the patient uses.  Level 5 caveat due to patient's dementia.  No other report of acute illness.  Patient has no complaints but is disoriented per baseline.  Home Medications Prior to Admission medications   Medication Sig Start Date End Date Taking? Authorizing Provider  acetaminophen (TYLENOL) 160 MG/5ML solution Place 10.2 mLs (325 mg total) into feeding tube every 6 (six) hours as needed for mild pain, headache or fever. Patient not taking: Reported on 10/23/2021 05/19/21   Lonia Blood, MD  famotidine (PEPCID) 20 MG tablet Place 20 mg into feeding tube at bedtime. 03/15/21   [provider]  Nutritional Supplements (FEEDING SUPPLEMENT, JEVITY 1.5 CAL/FIBER,) LIQD Place 1,000 mLs into feeding tube continuous. Patient taking differently: 1,000 mLs by Per J Tube route continuous. In the Morning & Afternoon for Nutrition. Cal @ 70 ml/hr continuous hours X 14 hours/day (Start @ 1800, Stop @ 0800 via PEG (total 1000 ml formula X 24 hours) 04/26/21   Dahal, Melina Schools, MD  Nutritional Supplements (FEEDING SUPPLEMENT, PROSOURCE TF,) liquid Place 45 mLs into feeding tube 2 (two) times daily. Patient not taking: Reported on 10/23/2021 04/26/21   Lorin Glass, MD  polyethylene glycol powder (GLYCOLAX/MIRALAX) 17 GM/SCOOP powder Place 17 g into feeding tube See admin instructions. Mix 17 grams of powder as directed and give, per G-Tube, once a day 03/15/21   [provider]   QUEtiapine (SEROQUEL) 25 MG tablet Place 1 tablet (25 mg total) into feeding tube at bedtime. 05/19/21   Lonia Blood, MD  Water For Irrigation, Sterile (FREE WATER) SOLN Place 200 mLs into feeding tube every 4 (four) hours. 05/19/21   Lonia Blood, MD      Allergies    Patient has no known allergies.    Review of Systems   Review of Systems  Unable to perform ROS: Dementia    Physical Exam Updated Vital Signs BP 102/84 (BP Location: Right Arm)   Pulse (!) 48   Temp 97.6 F (36.4 C) (Oral)   Resp 16   Ht 6\' 3"  (1.905 m)   Wt 85 kg   SpO2 100%   BMI 23.42 kg/m  Physical Exam Vitals and nursing note reviewed.  Constitutional:      General: He is not in acute distress.    Appearance: Normal appearance.  HENT:     Head: Normocephalic.     Mouth/Throat:     Mouth: Mucous membranes are moist.  Cardiovascular:     Rate and Rhythm: Normal rate.  Pulmonary:     Effort: Pulmonary effort is normal.  Abdominal:     General: There is no distension.     Palpations: Abdomen is soft.     Tenderness: There is no abdominal tenderness. There is no guarding or rebound.     Comments: G tube site mature, clean , no leaking  Skin:    General: Skin is warm.  Neurological:  Mental Status: He is alert. Mental status is at baseline.     ED Results / Procedures / Treatments   Labs (all labs ordered are listed, but only abnormal results are displayed) Labs Reviewed - No data to display  EKG None  Radiology No results found.  Procedures FEEDING TUBE REPLACEMENT  Date/Time: 12/30/2021 7:50 PM  Performed by: Rozelle Logan, DO Authorized by: Rozelle Logan, DO  Consent: The procedure was performed in an emergent situation. Verbal consent not obtained. Written consent not obtained. Patient identity confirmed: provided demographic data and arm band Indications: tube dislodged and tube removed by patient Tube type: gastrostomy Patient position: supine Procedure  type: replacement Tube size: 20. Endoscope used: no Bulb inflation fluid: normal saline Placement/position confirmation: x-ray Tube placement difficulty: none Patient tolerance: patient tolerated the procedure well with no immediate complications       Medications Ordered in ED Medications - No data to display  ED Course/ Medical Decision Making/ A&P                           Medical Decision Making Amount and/or Complexity of Data Reviewed Radiology: ordered.  Risk Prescription drug management.   76 year old male presents emergency department with G-tube dislodgment.  Reports that the patient pulled out a couple hours before.  The tract is mature.  There is no documentation of what size G-tube the patient uses.  Facility does not have any documentation of this.  Chart review does not provide any information in regards to this.  We were unable to obtain a 72 [place] and a 20 [place] G-tube.  The 20 [place] was attempted first, passed easily, gastric contents aspirated.  X-ray confirms placement without leakage/extravasation.  Patient will be discharged back to facility.        Final Clinical Impression(s) / ED Diagnoses Final diagnoses:  None    Rx / DC Orders ED Discharge Orders     None         Rozelle Logan, DO 12/30/21 2147

## 2021-12-30 NOTE — ED Notes (Signed)
Attempted to call facility for report. No answer x2

## 2021-12-30 NOTE — Discharge Instructions (Signed)
Patients G-tube was replaced.  X-ray confirms placement.  You may use his G-tube.

## 2022-01-25 ENCOUNTER — Emergency Department (HOSPITAL_COMMUNITY): Payer: Medicare Other

## 2022-01-25 ENCOUNTER — Emergency Department (HOSPITAL_COMMUNITY)
Admission: EM | Admit: 2022-01-25 | Discharge: 2022-01-25 | Disposition: A | Discharge status: Skilled Nursing Facility | Payer: Medicare Other | Attending: Emergency Medicine | Admitting: Emergency Medicine

## 2022-01-25 ENCOUNTER — Other Ambulatory Visit: Payer: Self-pay

## 2022-01-25 DIAGNOSIS — R001 Bradycardia, unspecified: Secondary | ICD-10-CM | POA: Diagnosis not present

## 2022-01-25 DIAGNOSIS — K9423 Gastrostomy malfunction: Secondary | ICD-10-CM | POA: Insufficient documentation

## 2022-01-25 DIAGNOSIS — F0390 Unspecified dementia without behavioral disturbance: Secondary | ICD-10-CM | POA: Insufficient documentation

## 2022-01-25 MED ORDER — IOHEXOL 300 MG/ML  SOLN
30.0000 mL | Freq: Once | INTRAMUSCULAR | Status: AC | PRN
  Administered 2022-01-25: 30 mL

## 2022-01-25 NOTE — ED Triage Notes (Signed)
BIB EMS from [facility] after something got stuck in Gtube, Pt was given Ativan prior to arrival

## 2022-01-25 NOTE — ED Notes (Signed)
Attempted to call report to facility, no answer at [phone], number listed on paperwork brought by EMS upon pt arrival

## 2022-01-25 NOTE — ED Notes (Signed)
Called [facility] for transport home. Gave report to [place], Charity fundraiser.

## 2022-01-25 NOTE — ED Provider Notes (Addendum)
[facility]-EMERGENCY DEPT Provider Note   CSN: [id number] Arrival date & time: 01/25/22  2013     History  No chief complaint on file.   Jack Rivas is a 76 y.o. male history of dementia, dysphagia with PEG tube, here presenting with PEG tube malfunction.  Patient apparently had some medicines given through the PEG tube and it became clotted.  Patient actually pulled out his PEG tube about a month ago and was replaced in the ED. patient was recently admitted to the hospital and was noted to be bradycardic with heart rate of 30s and EP saw patient and felt that patient does not need any further intervention.  Patient demented and unable to give much history.  The history is provided by the patient.       Home Medications Prior to Admission medications   Medication Sig Start Date End Date Taking? Authorizing Provider  acetaminophen (TYLENOL) 160 MG/5ML solution Place 10.2 mLs (325 mg total) into feeding tube every 6 (six) hours as needed for mild pain, headache or fever. Patient not taking: Reported on 10/23/2021 05/19/21   Lonia Blood, MD  famotidine (PEPCID) 20 MG tablet Place 20 mg into feeding tube at bedtime. 03/15/21   [provider]  Nutritional Supplements (FEEDING SUPPLEMENT, JEVITY 1.5 CAL/FIBER,) LIQD Place 1,000 mLs into feeding tube continuous. Patient taking differently: 1,000 mLs by Per J Tube route continuous. In the Morning & Afternoon for Nutrition. Cal @ 70 ml/hr continuous hours X 14 hours/day (Start @ 1800, Stop @ 0800 via PEG (total 1000 ml formula X 24 hours) 04/26/21   Dahal, Melina Schools, MD  Nutritional Supplements (FEEDING SUPPLEMENT, PROSOURCE TF,) liquid Place 45 mLs into feeding tube 2 (two) times daily. Patient not taking: Reported on 10/23/2021 04/26/21   Lorin Glass, MD  polyethylene glycol powder (GLYCOLAX/MIRALAX) 17 GM/SCOOP powder Place 17 g into feeding tube See admin instructions. Mix 17 grams of powder as directed and  give, per G-Tube, once a day 03/15/21   [provider]  QUEtiapine (SEROQUEL) 25 MG tablet Place 1 tablet (25 mg total) into feeding tube at bedtime. 05/19/21   Lonia Blood, MD  Water For Irrigation, Sterile (FREE WATER) SOLN Place 200 mLs into feeding tube every 4 (four) hours. 05/19/21   Lonia Blood, MD      Allergies    Patient has no known allergies.    Review of Systems   Review of Systems  Gastrointestinal:        PEG tube issue  All other systems reviewed and are negative.   Physical Exam Updated Vital Signs BP (!) 150/75   Pulse (!) 37   Temp 97.7 F (36.5 C)   Resp 12   Ht 6\' 3"  (1.905 m)   Wt 85 kg   SpO2 100%   BMI 23.42 kg/m  Physical Exam Vitals and nursing note reviewed.  Constitutional:      Comments: Demented, unable to give much history  HENT:     Head: Normocephalic.     Nose: Nose normal.     Mouth/Throat:     Mouth: Mucous membranes are moist.  Eyes:     Extraocular Movements: Extraocular movements intact.     Pupils: Pupils are equal, round, and reactive to light.  Cardiovascular:     Rate and Rhythm: Normal rate and regular rhythm.     Pulses: Normal pulses.     Heart sounds: Normal heart sounds.  Pulmonary:  Effort: Pulmonary effort is normal.     Breath sounds: Normal breath sounds.  Abdominal:     Comments: PEG tube in place.  The PEG tube seems to be clotted and I was able to put a Q-tip through the PEG tube to declot it and put saline to unclog the tube.  Musculoskeletal:     Cervical back: Normal range of motion and neck supple.     Comments: Bedbound  Skin:    General: Skin is warm.     Capillary Refill: Capillary refill takes less than 2 seconds.  Neurological:     Comments: Demented, moving all extremities.  Patient is bedbound  Psychiatric:        Mood and Affect: Mood normal.        Behavior: Behavior normal.     ED Results / Procedures / Treatments   Labs (all labs ordered are listed, but only  abnormal results are displayed) Labs Reviewed - No data to display  EKG None  Radiology No results found.  Procedures FEEDING TUBE REPLACEMENT  Date/Time: 01/25/2022 11:06 PM  Performed by: Charlynne Pander, MD Authorized by: Charlynne Pander, MD  Consent: Verbal consent obtained. Risks and benefits: risks, benefits and alternatives were discussed Consent given by: patient Required items: required blood products, implants, devices, and special equipment available Patient identity confirmed: verbally with patient Preparation: Patient was prepped and draped in the usual sterile fashion. Indications: tube malfunction Local anesthesia used: no  Anesthesia: Local anesthesia used: no  Sedation: Patient sedated: no  Tube type: gastrostomy Patient position: supine Procedure type: replacement Tube size: 20. Bulb inflation volume: 10 (ml) Bulb inflation fluid: normal saline Placement/position confirmation: x-ray Patient tolerance: patient tolerated the procedure well with no immediate complications         Medications Ordered in ED Medications - No data to display  ED Course/ Medical Decision Making/ A&P                           Medical Decision Making Jack Rivas is a 76 y.o. male here presenting with clotted PEG tube.  I was able to put a Q-tip through the tube and declot it with some saline.  Of note, patient is bradycardic in the 30s.  During his recent admission, he is bradycardic and was evaluated by EP and no interventions was necessary at that time.  I think this is a chronic issue.  PEG tube is able to be used at this point  11:06 PM We called the facility and it turns out that a piece of the feeding tube was actually stuck in the PEG tube.  I was unable to remove it.  I actually took out the PEG tube and replace it with 20 French PEG tube and that was confirmed by x-ray.   Problems Addressed: Bradycardia: acute illness or injury PEG tube malfunction  [facility]): acute illness or injury  Amount and/or Complexity of Data Reviewed Radiology: ordered. ECG/medicine tests: ordered and independent interpretation performed. Decision-making details documented in ED Course.    Final Clinical Impression(s) / ED Diagnoses Final diagnoses:  None    Rx / DC Orders ED Discharge Orders     None         Charlynne Pander, MD 01/25/22 2111    Charlynne Pander, MD 01/25/22 [phone]

## 2022-01-25 NOTE — Discharge Instructions (Signed)
Your PEG tube was declotted and is functioning well.  It may be used for medicine and tube feeds.  You have bradycardia that is chronic.  See your doctor for follow-up  Return to ER if you have clotted PEG tube, pulled out PEG tube, chest pain, shortness of breath

## 2022-03-01 ENCOUNTER — Inpatient Hospital Stay (HOSPITAL_COMMUNITY)
Admission: EM | Admit: 2022-03-01 | Discharge: 2022-03-07 | DRG: 871 | Disposition: A | Discharge status: Hospice | Attending: Internal Medicine | Admitting: Internal Medicine

## 2022-03-01 ENCOUNTER — Emergency Department (HOSPITAL_COMMUNITY)

## 2022-03-01 ENCOUNTER — Encounter (HOSPITAL_COMMUNITY): Payer: Self-pay

## 2022-03-01 ENCOUNTER — Other Ambulatory Visit: Payer: Self-pay

## 2022-03-01 DIAGNOSIS — R680 Hypothermia, not associated with low environmental temperature: Secondary | ICD-10-CM | POA: Diagnosis present

## 2022-03-01 DIAGNOSIS — N1831 Chronic kidney disease, stage 3a: Secondary | ICD-10-CM | POA: Diagnosis present

## 2022-03-01 DIAGNOSIS — Z20822 Contact with and (suspected) exposure to covid-19: Secondary | ICD-10-CM | POA: Diagnosis present

## 2022-03-01 DIAGNOSIS — N189 Chronic kidney disease, unspecified: Secondary | ICD-10-CM | POA: Diagnosis not present

## 2022-03-01 DIAGNOSIS — F17200 Nicotine dependence, unspecified, uncomplicated: Secondary | ICD-10-CM | POA: Diagnosis present

## 2022-03-01 DIAGNOSIS — I129 Hypertensive chronic kidney disease with stage 1 through stage 4 chronic kidney disease, or unspecified chronic kidney disease: Secondary | ICD-10-CM | POA: Diagnosis present

## 2022-03-01 DIAGNOSIS — G9341 Metabolic encephalopathy: Secondary | ICD-10-CM | POA: Diagnosis present

## 2022-03-01 DIAGNOSIS — R627 Adult failure to thrive: Secondary | ICD-10-CM | POA: Diagnosis present

## 2022-03-01 DIAGNOSIS — J690 Pneumonitis due to inhalation of food and vomit: Secondary | ICD-10-CM | POA: Diagnosis present

## 2022-03-01 DIAGNOSIS — Z79899 Other long term (current) drug therapy: Secondary | ICD-10-CM | POA: Diagnosis not present

## 2022-03-01 DIAGNOSIS — Z7401 Bed confinement status: Secondary | ICD-10-CM | POA: Diagnosis not present

## 2022-03-01 DIAGNOSIS — M5030 Other cervical disc degeneration, unspecified cervical region: Secondary | ICD-10-CM | POA: Diagnosis present

## 2022-03-01 DIAGNOSIS — E162 Hypoglycemia, unspecified: Secondary | ICD-10-CM | POA: Diagnosis present

## 2022-03-01 DIAGNOSIS — T68XXXA Hypothermia, initial encounter: Secondary | ICD-10-CM

## 2022-03-01 DIAGNOSIS — A419 Sepsis, unspecified organism: Secondary | ICD-10-CM | POA: Diagnosis present

## 2022-03-01 DIAGNOSIS — R001 Bradycardia, unspecified: Secondary | ICD-10-CM

## 2022-03-01 DIAGNOSIS — R4182 Altered mental status, unspecified: Secondary | ICD-10-CM | POA: Diagnosis not present

## 2022-03-01 DIAGNOSIS — Z931 Gastrostomy status: Secondary | ICD-10-CM | POA: Diagnosis not present

## 2022-03-01 DIAGNOSIS — Z6824 Body mass index (BMI) 24.0-24.9, adult: Secondary | ICD-10-CM | POA: Diagnosis not present

## 2022-03-01 DIAGNOSIS — E440 Moderate protein-calorie malnutrition: Secondary | ICD-10-CM | POA: Diagnosis present

## 2022-03-01 DIAGNOSIS — F0150 Vascular dementia without behavioral disturbance: Secondary | ICD-10-CM | POA: Diagnosis not present

## 2022-03-01 DIAGNOSIS — E860 Dehydration: Secondary | ICD-10-CM | POA: Diagnosis present

## 2022-03-01 DIAGNOSIS — R1310 Dysphagia, unspecified: Secondary | ICD-10-CM | POA: Diagnosis present

## 2022-03-01 DIAGNOSIS — N179 Acute kidney failure, unspecified: Secondary | ICD-10-CM | POA: Diagnosis present

## 2022-03-01 DIAGNOSIS — J019 Acute sinusitis, unspecified: Secondary | ICD-10-CM | POA: Diagnosis present

## 2022-03-01 DIAGNOSIS — F0390 Unspecified dementia without behavioral disturbance: Secondary | ICD-10-CM | POA: Diagnosis present

## 2022-03-01 DIAGNOSIS — Z515 Encounter for palliative care: Secondary | ICD-10-CM

## 2022-03-01 DIAGNOSIS — R7989 Other specified abnormal findings of blood chemistry: Secondary | ICD-10-CM

## 2022-03-01 DIAGNOSIS — R6521 Severe sepsis with septic shock: Secondary | ICD-10-CM | POA: Diagnosis not present

## 2022-03-01 DIAGNOSIS — N1832 Chronic kidney disease, stage 3b: Secondary | ICD-10-CM | POA: Diagnosis present

## 2022-03-01 LAB — URINALYSIS, ROUTINE W REFLEX MICROSCOPIC
Bilirubin Urine: NEGATIVE
Glucose, UA: NEGATIVE mg/dL
Hgb urine dipstick: NEGATIVE
Ketones, ur: NEGATIVE mg/dL
Leukocytes,Ua: NEGATIVE
Nitrite: NEGATIVE
Protein, ur: NEGATIVE mg/dL
Specific Gravity, Urine: 1.016 (ref 1.005–1.030)
pH: 6 (ref 5.0–8.0)

## 2022-03-01 LAB — RAPID URINE DRUG SCREEN, HOSP PERFORMED
Amphetamines: NOT DETECTED
Barbiturates: NOT DETECTED
Benzodiazepines: NOT DETECTED
Cocaine: NOT DETECTED
Opiates: NOT DETECTED
Tetrahydrocannabinol: NOT DETECTED

## 2022-03-01 LAB — TSH: TSH: 9.132 u[IU]/mL — ABNORMAL HIGH (ref 0.350–4.500)

## 2022-03-01 LAB — PROTIME-INR
INR: 1.1 (ref 0.8–1.2)
Prothrombin Time: 14.2 seconds (ref 11.4–15.2)

## 2022-03-01 LAB — LACTIC ACID, PLASMA: Lactic Acid, Venous: 1.2 mmol/L (ref 0.5–1.9)

## 2022-03-01 LAB — DIFFERENTIAL
Abs Immature Granulocytes: 0 10*3/uL (ref 0.00–0.07)
Basophils Absolute: 0 10*3/uL (ref 0.0–0.1)
Basophils Relative: 1 %
Eosinophils Absolute: 0.7 10*3/uL — ABNORMAL HIGH (ref 0.0–0.5)
Eosinophils Relative: 17 %
Immature Granulocytes: 0 %
Lymphocytes Relative: 42 %
Lymphs Abs: 1.9 10*3/uL (ref 0.7–4.0)
Monocytes Absolute: 0.4 10*3/uL (ref 0.1–1.0)
Monocytes Relative: 8 %
Neutro Abs: 1.4 10*3/uL — ABNORMAL LOW (ref 1.7–7.7)
Neutrophils Relative %: 32 %

## 2022-03-01 LAB — COMPREHENSIVE METABOLIC PANEL
ALT: 16 U/L (ref 0–44)
AST: 16 U/L (ref 15–41)
Albumin: 3.2 g/dL — ABNORMAL LOW (ref 3.5–5.0)
Alkaline Phosphatase: 63 U/L (ref 38–126)
Anion gap: 8 (ref 5–15)
BUN: 29 mg/dL — ABNORMAL HIGH (ref 8–23)
CO2: 28 mmol/L (ref 22–32)
Calcium: 9.1 mg/dL (ref 8.9–10.3)
Chloride: 104 mmol/L (ref 98–111)
Creatinine, Ser: 1.63 mg/dL — ABNORMAL HIGH (ref 0.61–1.24)
GFR, Estimated: 43 mL/min — ABNORMAL LOW (ref >60)
Glucose, Bld: 101 mg/dL — ABNORMAL HIGH (ref 70–99)
Potassium: 4.4 mmol/L (ref 3.5–5.1)
Sodium: 140 mmol/L (ref 135–145)
Total Bilirubin: 0.2 mg/dL — ABNORMAL LOW (ref 0.3–1.2)
Total Protein: 7 g/dL (ref 6.5–8.1)

## 2022-03-01 LAB — I-STAT VENOUS BLOOD GAS, ED
Acid-Base Excess: 4 mmol/L — ABNORMAL HIGH (ref 0.0–2.0)
Bicarbonate: 28.7 mmol/L — ABNORMAL HIGH (ref 20.0–28.0)
Calcium, Ion: 1.08 mmol/L — ABNORMAL LOW (ref 1.15–1.40)
HCT: 32 % — ABNORMAL LOW (ref 39.0–52.0)
Hemoglobin: 10.9 g/dL — ABNORMAL LOW (ref 13.0–17.0)
O2 Saturation: 79 %
Potassium: 4.3 mmol/L (ref 3.5–5.1)
Sodium: 138 mmol/L (ref 135–145)
TCO2: 30 mmol/L (ref 22–32)
pCO2, Ven: 44.5 mmHg (ref 44–60)
pH, Ven: 7.418 (ref 7.25–7.43)
pO2, Ven: 43 mmHg (ref 32–45)

## 2022-03-01 LAB — CBC
HCT: 33.7 % — ABNORMAL LOW (ref 39.0–52.0)
Hemoglobin: 10.5 g/dL — ABNORMAL LOW (ref 13.0–17.0)
MCH: 28.9 pg (ref 26.0–34.0)
MCHC: 31.2 g/dL (ref 30.0–36.0)
MCV: 92.8 fL (ref 80.0–100.0)
Platelets: 152 10*3/uL (ref 150–400)
RBC: 3.63 MIL/uL — ABNORMAL LOW (ref 4.22–5.81)
RDW: 13.7 % (ref 11.5–15.5)
WBC: 4.5 10*3/uL (ref 4.0–10.5)
nRBC: 0 % (ref 0.0–0.2)

## 2022-03-01 LAB — CORTISOL: Cortisol, Plasma: 11.6 ug/dL

## 2022-03-01 LAB — PROCALCITONIN: Procalcitonin: <0.1 ng/mL

## 2022-03-01 LAB — RESP PANEL BY RT-PCR (FLU A&B, COVID) ARPGX2
Influenza A by PCR: NEGATIVE
Influenza B by PCR: NEGATIVE
SARS Coronavirus 2 by RT PCR: NEGATIVE

## 2022-03-01 LAB — T4, FREE: Free T4: 0.9 ng/dL (ref 0.61–1.12)

## 2022-03-01 LAB — ETHANOL: Alcohol, Ethyl (B): <10 mg/dL (ref <10)

## 2022-03-01 LAB — APTT: aPTT: 42 seconds — ABNORMAL HIGH (ref 24–36)

## 2022-03-01 LAB — CBG MONITORING, ED: Glucose-Capillary: 95 mg/dL (ref 70–99)

## 2022-03-01 LAB — AMMONIA: Ammonia: 23 umol/L (ref 9–35)

## 2022-03-01 MED ORDER — VALPROIC ACID 250 MG/5ML PO SOLN
250.0000 mg | Freq: Every evening | ORAL | Status: DC
Start: 2022-03-01 — End: 2022-03-01
  Filled 2022-03-01: qty 5

## 2022-03-01 MED ORDER — POLYETHYLENE GLYCOL 3350 17 G PO PACK
17.0000 g | PACK | Freq: Every day | ORAL | Status: DC
  Administered 2022-03-01 – 2022-03-07 (×7): 17 g
  Filled 2022-03-01 (×7): qty 1

## 2022-03-01 MED ORDER — ALBUTEROL SULFATE (2.5 MG/3ML) 0.083% IN NEBU
2.5000 mg | INHALATION_SOLUTION | RESPIRATORY_TRACT | Status: DC | PRN
  Administered 2022-03-07: 2.5 mg via RESPIRATORY_TRACT
  Filled 2022-03-01: qty 3

## 2022-03-01 MED ORDER — GUAIFENESIN ER 600 MG PO TB12: 600.0000 mg | ORAL_TABLET | Freq: Two times a day (BID) | ORAL | Status: DC | PRN

## 2022-03-01 MED ORDER — SODIUM CHLORIDE 0.9 % IV SOLN
100.0000 mL/h | INTRAVENOUS | Status: DC
  Administered 2022-03-01: 100 mL/h via INTRAVENOUS

## 2022-03-01 MED ORDER — SODIUM CHLORIDE 0.9 % IV SOLN
500.0000 mg | INTRAVENOUS | Status: DC
  Administered 2022-03-02 – 2022-03-03 (×2): 500 mg via INTRAVENOUS
  Filled 2022-03-01 (×2): qty 5

## 2022-03-01 MED ORDER — SODIUM CHLORIDE 0.9 % IV SOLN: INTRAVENOUS | Status: DC

## 2022-03-01 MED ORDER — ONDANSETRON HCL 4 MG/2ML IJ SOLN: 4.0000 mg | Freq: Four times a day (QID) | INTRAMUSCULAR | Status: DC | PRN

## 2022-03-01 MED ORDER — POLYETHYLENE GLYCOL 3350 17 GM/SCOOP PO POWD
17.0000 g | ORAL | Status: DC
Start: 2022-03-02 — End: 2022-03-01

## 2022-03-01 MED ORDER — SODIUM CHLORIDE 0.9% FLUSH
3.0000 mL | Freq: Two times a day (BID) | INTRAVENOUS | Status: DC
  Administered 2022-03-01 – 2022-03-06 (×7): 3 mL via INTRAVENOUS

## 2022-03-01 MED ORDER — VALPROIC ACID 250 MG/5ML PO SOLN: 250.0000 mg | Freq: Every evening | ORAL | Status: DC

## 2022-03-01 MED ORDER — FAMOTIDINE 20 MG PO TABS
20.0000 mg | ORAL_TABLET | Freq: Every day | ORAL | Status: DC
  Administered 2022-03-01 – 2022-03-02 (×2): 20 mg
  Filled 2022-03-01 (×2): qty 1

## 2022-03-01 MED ORDER — SODIUM CHLORIDE 0.9 % IV SOLN
3.0000 g | Freq: Three times a day (TID) | INTRAVENOUS | Status: AC
  Administered 2022-03-01 – 2022-03-06 (×15): 3 g via INTRAVENOUS
  Filled 2022-03-01 (×15): qty 8

## 2022-03-01 MED ORDER — VALPROIC ACID 250 MG/5ML PO SOLN
250.0000 mg | Freq: Every evening | ORAL | Status: DC
  Administered 2022-03-01 – 2022-03-06 (×6): 250 mg
  Filled 2022-03-01 (×7): qty 5

## 2022-03-01 MED ORDER — HYDRALAZINE HCL 20 MG/ML IJ SOLN: 5.0000 mg | INTRAMUSCULAR | Status: DC | PRN

## 2022-03-01 MED ORDER — ACETAMINOPHEN 650 MG RE SUPP: 650.0000 mg | Freq: Four times a day (QID) | RECTAL | Status: DC | PRN

## 2022-03-01 MED ORDER — ATROPINE SULFATE 1 % OP SOLN
4.0000 [drp] | OPHTHALMIC | Status: DC | PRN
Start: 2022-03-01 — End: 2022-03-07

## 2022-03-01 MED ORDER — ENOXAPARIN SODIUM 40 MG/0.4ML IJ SOSY
40.0000 mg | PREFILLED_SYRINGE | INTRAMUSCULAR | Status: DC
  Administered 2022-03-01 – 2022-03-06 (×6): 40 mg via SUBCUTANEOUS
  Filled 2022-03-01 (×6): qty 0.4

## 2022-03-01 MED ORDER — ACETAMINOPHEN 325 MG PO TABS
650.0000 mg | ORAL_TABLET | Freq: Four times a day (QID) | ORAL | Status: DC | PRN
  Administered 2022-03-02: 650 mg via ORAL
  Filled 2022-03-01: qty 2

## 2022-03-01 MED ORDER — BISACODYL 10 MG RE SUPP: 10.0000 mg | Freq: Every day | RECTAL | Status: DC | PRN

## 2022-03-01 MED ORDER — ONDANSETRON HCL 4 MG PO TABS: 4.0000 mg | ORAL_TABLET | Freq: Four times a day (QID) | ORAL | Status: DC | PRN

## 2022-03-01 MED ORDER — SODIUM CHLORIDE 0.9 % IV BOLUS
500.0000 mL | Freq: Once | INTRAVENOUS | Status: AC
  Administered 2022-03-01: 500 mL via INTRAVENOUS

## 2022-03-01 MED ORDER — LORAZEPAM 1 MG PO TABS: 0.5000 mg | ORAL_TABLET | Freq: Every day | ORAL | Status: DC

## 2022-03-01 MED ORDER — QUETIAPINE FUMARATE 25 MG PO TABS
25.0000 mg | ORAL_TABLET | Freq: Every day | ORAL | Status: DC
  Administered 2022-03-01 – 2022-03-02 (×2): 25 mg
  Filled 2022-03-01 (×2): qty 1

## 2022-03-01 MED ORDER — FREE WATER
200.0000 mL | Status: DC
  Administered 2022-03-01 – 2022-03-07 (×33): 200 mL

## 2022-03-01 MED ORDER — LORAZEPAM 0.5 MG PO TABS
0.5000 mg | ORAL_TABLET | Freq: Every day | ORAL | Status: DC
  Administered 2022-03-02 – 2022-03-07 (×6): 0.5 mg
  Filled 2022-03-01 (×7): qty 1

## 2022-03-01 NOTE — ED Notes (Addendum)
Patient had linen and brief changed/cleaned. Patient throughout this procedure was able to yell randomly "no" and other random words that were incomprehensible. He was able to follow commands after repeatedly asking.

## 2022-03-01 NOTE — H&P (Addendum)
History and Physical    Patient: Jack Rivas TGG:[id number] DOB: 1946-05-10 DOA: 03/01/2022 DOS: the patient was seen and examined on 03/01/2022 PCP: Patient, No Pcp Per  Patient coming from: SNF - [facility]; NOK: Daughter, Rocky Crafts, [phone]   Chief Complaint: AMS  HPI: Jack Rivas is a 76 y.o. male with medical history significant of dementia, HTN, and aspiration PNA with feeding tube presenting with AMS.  He is enrolled in hospice but is full code.  His daughter reports that she was at his facility last night and he was at his usual baseline - he is nonambulatory but is awake and alert and conversant.  He is supposed to have a few bites of PO intake with feeding assistance in addition to tube feeds but "they bring him a full tray" and leave it for him at the facility according to his daughter.  He does have some coughing/choking/sputtering with PO intake.  He has a PEG tube and she thinks he is starving right now and if we just give him tube feeds and PO intake his HR will normalize.  He has bradycardia and hypothermia "all the time", as he had a similar presentation in April during his hospitalization; she is unsure if this has been ongoing since then.  She is frustrated that he is not at [facility] since "they have all his records" there and he "always" goes there.    ER Course:  Hospice patient but full code.  Here with AMS - ?cause.  Hypothermic, bradycardia.  Hospitalized in April with similar presentation, due to aspiration PNA.  Persistent bradycardia, temp 94.5.       Review of Systems: unable to review all systems due to the inability of the patient to answer questions. Past Medical History:  Diagnosis Date   Dementia (HCC)    Hypertension    Uses feeding tube    Past Surgical History:  Procedure Laterality Date   CATARACT EXTRACTION, BILATERAL     KNEE SURGERY     Social History:  reports that he has been smoking. He has never used smokeless tobacco.  He reports current alcohol use. He reports that he does not use drugs.  No Known Allergies  No family history on file.  Prior to Admission medications   Medication Sig Start Date End Date Taking? Authorizing Provider  acetaminophen (TYLENOL) 160 MG/5ML solution Place 10.2 mLs (325 mg total) into feeding tube every 6 (six) hours as needed for mild pain, headache or fever. Patient not taking: Reported on 10/23/2021 05/19/21   Lonia Blood, MD  famotidine (PEPCID) 20 MG tablet Place 20 mg into feeding tube at bedtime. 03/15/21   [provider]  Nutritional Supplements (FEEDING SUPPLEMENT, JEVITY 1.5 CAL/FIBER,) LIQD Place 1,000 mLs into feeding tube continuous. Patient taking differently: 1,000 mLs by Per J Tube route continuous. In the Morning & Afternoon for Nutrition. Cal @ 70 ml/hr continuous hours X 14 hours/day (Start @ 1800, Stop @ 0800 via PEG (total 1000 ml formula X 24 hours) 04/26/21   Dahal, Melina Schools, MD  Nutritional Supplements (FEEDING SUPPLEMENT, PROSOURCE TF,) liquid Place 45 mLs into feeding tube 2 (two) times daily. Patient not taking: Reported on 10/23/2021 04/26/21   Lorin Glass, MD  polyethylene glycol powder (GLYCOLAX/MIRALAX) 17 GM/SCOOP powder Place 17 g into feeding tube See admin instructions. Mix 17 grams of powder as directed and give, per G-Tube, once a day 03/15/21   [provider]  QUEtiapine (SEROQUEL) 25 MG tablet Place 1  tablet (25 mg total) into feeding tube at bedtime. 05/19/21   Lonia Blood, MD  Water For Irrigation, Sterile (FREE WATER) SOLN Place 200 mLs into feeding tube every 4 (four) hours. 05/19/21   Lonia Blood, MD    Physical Exam: Vitals:   03/01/22 1600 03/01/22 1605 03/01/22 1630 03/01/22 1730  BP: 138/66  133/64 119/79  Pulse:   (!) 41   Resp:  10 10 11   Temp:      TempSrc:      SpO2:   99%   Weight:      Height:       General:  Appears very slow to respond, unable to effectively answer any questions, does  not follow commands Eyes:  PERRL, EOMI, normal lids, iris ENT:  grossly normal hearing, lips & tongue, moderately dry mm Neck:  no LAD, masses or thyromegaly Cardiovascular:  RR with persistent marked bradycardia in the 30-40 range, no m/r/g. No LE edema.  Respiratory:   Diffuse rhonchi, particularly on the left.  Normal respiratory effort on [place] O2. Abdomen:  soft, NT, ND Skin:  no rash or induration seen on limited exam Musculoskeletal:  decreased strength BUE/BLE, no bony abnormality Psychiatric:  unable to answer questions or follow commands but alert Neurologic:  unable to effectively perform   Radiological Exams on Admission: Independently reviewed - see discussion in A/P where applicable  CT HEAD WO CONTRAST  Result Date: 03/01/2022 CLINICAL DATA:  Altered mental status. EXAM: CT HEAD WITHOUT CONTRAST CT CERVICAL SPINE WITHOUT CONTRAST TECHNIQUE: Multidetector CT imaging of the head and cervical spine was performed following the standard protocol without intravenous contrast. Multiplanar CT image reconstructions of the cervical spine were also generated. RADIATION DOSE REDUCTION: This exam was performed according to the departmental dose-optimization program which includes automated exposure control, adjustment of the mA and/or kV according to patient size and/or use of iterative reconstruction technique. COMPARISON:  May 10, 2021. FINDINGS: CT HEAD FINDINGS Brain: No evidence of acute infarction, hemorrhage, hydrocephalus, extra-axial collection or mass lesion/mass effect. Vascular: No hyperdense vessel or unexpected calcification. Skull: Normal. Negative for fracture or focal lesion. Sinuses/Orbits: Complete opacification of right maxillary sinus and right sphenoid sinus consistent with sinusitis. Other: None. CT CERVICAL SPINE FINDINGS Alignment: Mild grade 1 anterolisthesis of C7-T1 is noted secondary to posterior facet joint hypertrophy. Skull base and vertebrae: No acute fracture.  There is increased left superior articular process of C3 as well as involving the left-sided articular process of C2, most consistent with significantly worsening degenerative change, although focal infection cannot be excluded. Soft tissues and spinal canal: No prevertebral fluid or swelling. No visible canal hematoma. Disc levels: Severe degenerative disc disease is noted at C3-4, C4-5 C6-7 and C7-T1. Status post surgical interbody fusion of C5-6. Upper chest: Negative. Other: None. IMPRESSION: No acute intracranial abnormality is noted. Probable acute right maxillary and sphenoid sinusitis is noted. No fracture is noted in the cervical spine. Multilevel degenerative changes are noted. There is noted increased destruction of the adjacent articular processes of the left-sided C2-3 facet joint, most consistent with worsening degenerative joint disease, although infection cannot be excluded. MRI may be performed for further evaluation. Electronically Signed   By: May 12, 2021 M.D.   On: 03/01/2022 15:06   CT Cervical Spine Wo Contrast  Result Date: 03/01/2022 CLINICAL DATA:  Altered mental status. EXAM: CT HEAD WITHOUT CONTRAST CT CERVICAL SPINE WITHOUT CONTRAST TECHNIQUE: Multidetector CT imaging of the head and cervical spine was performed following  the standard protocol without intravenous contrast. Multiplanar CT image reconstructions of the cervical spine were also generated. RADIATION DOSE REDUCTION: This exam was performed according to the departmental dose-optimization program which includes automated exposure control, adjustment of the mA and/or kV according to patient size and/or use of iterative reconstruction technique. COMPARISON:  May 10, 2021. FINDINGS: CT HEAD FINDINGS Brain: No evidence of acute infarction, hemorrhage, hydrocephalus, extra-axial collection or mass lesion/mass effect. Vascular: No hyperdense vessel or unexpected calcification. Skull: Normal. Negative for fracture or focal  lesion. Sinuses/Orbits: Complete opacification of right maxillary sinus and right sphenoid sinus consistent with sinusitis. Other: None. CT CERVICAL SPINE FINDINGS Alignment: Mild grade 1 anterolisthesis of C7-T1 is noted secondary to posterior facet joint hypertrophy. Skull base and vertebrae: No acute fracture. There is increased left superior articular process of C3 as well as involving the left-sided articular process of C2, most consistent with significantly worsening degenerative change, although focal infection cannot be excluded. Soft tissues and spinal canal: No prevertebral fluid or swelling. No visible canal hematoma. Disc levels: Severe degenerative disc disease is noted at C3-4, C4-5 C6-7 and C7-T1. Status post surgical interbody fusion of C5-6. Upper chest: Negative. Other: None. IMPRESSION: No acute intracranial abnormality is noted. Probable acute right maxillary and sphenoid sinusitis is noted. No fracture is noted in the cervical spine. Multilevel degenerative changes are noted. There is noted increased destruction of the adjacent articular processes of the left-sided C2-3 facet joint, most consistent with worsening degenerative joint disease, although infection cannot be excluded. MRI may be performed for further evaluation. Electronically Signed   By: Marijo Conception M.D.   On: 03/01/2022 15:06   DG Chest Portable 1 View  Result Date: 03/01/2022 CLINICAL DATA:  Weakness EXAM: PORTABLE CHEST 1 VIEW COMPARISON:  Chest x-ray dated October 23, 2021; chest CT dated October 24, 2021 FINDINGS: The heart size and mediastinal contours are within normal limits. Unchanged unchanged calcification overlying the left mid lung, correlate with soft tissue calcification on prior chest CT. Both lungs are clear. The visualized skeletal structures are unremarkable. IMPRESSION: No active disease. Electronically Signed   By: Yetta Glassman M.D.   On: 03/01/2022 13:30    EKG: Independently reviewed.  Afib with rate  42; nonspecific ST changes with no evidence of acute ischemia   Labs on Admission: I have personally reviewed the available labs and imaging studies at the time of the admission.  Pertinent labs:    VBG: 7.418/44.5/28.7 BUN 29/Creatinine 1.63/GFR 43; 29/0.89/>60 in 10/2021 Lactate 1.2 WBC 4.5 Hgb 10.5 TSH 9.132 UDS negative UA WNL   Assessment and Plan: Principal Problem:   Septic shock due to undetermined organism [facility]) Active Problems:   Aspiration pneumonia (HCC)   Dementia ([place])   Dysphagia   Chronic kidney disease, stage 3a (HCC)   Bradycardia   Acute kidney injury superimposed on chronic kidney disease (HCC)    Septic shock, likely due to aspiration PNA -Sepsis indicates life-threatening organ dysfunction with mortality >10%, caused by dysregulation to host response.   -SIRS criteria in this patient includes: Hypothermia, bradycardia  -Patient has evidence of acute organ failure with encephalopathy that is not easily explained by another condition. -While awaiting blood cultures, this appears to be a preseptic condition. -Sepsis protocol initiated -Suspected source is aspiration PNA; despite normal RR and sats, he has marked L-sided rhonchi on exam with known h/o dysphagia/PEG tube but still taking PO -Blood and urine cultures pending -Will admit to progressive care - unless PCCM thinks  he requires ICU level of care -Will admit due to:  AMS that is severe or persistent; core temp <95 thought to be due to infection -Treat with IV Unasyn/Azithromycin for suspected aspiration PNA -Will order lower respiratory tract procalcitonin level.   -PCCM consulted  Bradycardia/hypothermia -It is also a lesser consideration that this is not related to shock physiology and is a primary cardiogenic bradycardia -Once he is warmed, his HR is expected to improve -Cardiology consultation could be considered -Cardiology was consulted during prior hospitalization and recommended EP  consult - who recommended no further management and discontinuation of telemetry  Advanced dementia -Patient resides in a SNF, has a feeding tube for nutritional needs, and is on Seroquel nightly for behavior disturbance -He is enrolled in hospice but remains full code -Palliative care consult for ongoing discussion of [place] may be reasonable, but [facility] is following as an outpatient already so will hold for now -Continue Ativan, Depakote  Dysphagia -Has PEG tube -PEG evaluation requested by ostomy nurse, appears to be mildly dislodged -Previously recommended for nectar thick liquid with dysphagia 1 diet -Will request ST evaluation -NPO for now given high risk of aspiration  AKI on stage 3a CKD -Likely related to sepsis -Will gently hydrate and follow    Total critical care time: 65 minutes Critical care time was exclusive of separately billable procedures and treating other patients. Critical care was necessary to treat or prevent imminent or life-threatening deterioration. Critical care was time spent personally by me on the following activities: development of treatment plan with patient and/or surrogate as well as nursing, discussions with consultants, evaluation of patient's response to treatment, examination of patient, obtaining history from patient or surrogate, ordering and performing treatments and interventions, ordering and review of laboratory studies, ordering and review of radiographic studies, pulse oximetry and re-evaluation of patient's condition.     Advance Care Planning:   Code Status: Full Code   Consults: PCCM; PT/OT; ST; RT; Wound care; TOC team  DVT Prophylaxis: Lovenox  Family Communication: Daughter was present throughout evaluation  Severity of Illness: The appropriate patient status for this patient is INPATIENT. Inpatient status is judged to be reasonable and necessary in order to provide the required intensity of service to ensure the patient's  safety. The patient's presenting symptoms, physical exam findings, and initial radiographic and laboratory data in the context of their chronic comorbidities is felt to place them at high risk for further clinical deterioration. Furthermore, it is not anticipated that the patient will be medically stable for discharge from the hospital within 2 midnights of admission.   * I certify that at the point of admission it is my clinical judgment that the patient will require inpatient hospital care spanning beyond 2 midnights from the point of admission due to high intensity of service, high risk for further deterioration and high frequency of surveillance required.*  Author: Karmen Bongo, MD 03/01/2022 6:01 PM  For on call review [email or web address].

## 2022-03-01 NOTE — ED Notes (Signed)
Critical care at bedside  

## 2022-03-01 NOTE — Progress Notes (Signed)
[employer], [employer] [facility]) Hospital Liaison Note  This is a current [facility] hospice patient with a diagnosis of senile degeneration of the brain. We will follow while hospitalized. Please call with any questions or concerns. Thank you  Dionicio Stall, [facility] Liaison  [phone]

## 2022-03-01 NOTE — Consult Note (Signed)
NAME:  Jack Rivas, MRN:  [id number], DOB:  14-Jul-1946, LOS: 0 ADMISSION DATE:  03/01/2022, CONSULTATION DATE:  03/01/22 REFERRING MD:  Ophelia Charter, CHIEF COMPLAINT:  bradycardia   History of Present Illness:  Jack Rivas is a 76 y/o M with PMH of Dementia and HTN and G tube who presented from SNF with AMS and bradycardia without hypotension.  He is in hospice but is full code.  Appears that bradycardia and hypothermia are a chronic issue for him.  Family is also feeding him po food.  Work up in the ED significant for creatinine 1.6, UDS negative., lactic acid 1.2, CXR negative, T4 WNL.  HR in the 40's in the ED, temp 94.27F  Pertinent  Medical History   has a past medical history of Dementia (HCC), Hypertension, and Uses feeding tube.   Significant Hospital Events: Including procedures, antibiotic start and stop dates in addition to other pertinent events   8/16 presented to the ED with Ams and bradycardia  Interim History / Subjective:  Pt awake, protecting his airway, HR 40's, still hypothermic  Objective   Blood pressure 119/79, pulse (!) 41, temperature (!) 94.5 F (34.7 C), temperature source Rectal, resp. rate 11, height 6\' 3"  (1.905 m), weight 85 kg, SpO2 99 %.       No intake or output data in the 24 hours ending 03/01/22 1748 Filed Weights   03/01/22 1316  Weight: 85 kg    General:  elderly M, sleeping,  HEENT: MM pink/dry Neuro: opens eyes to voice, does not answer questions, withdraws to pain CV: s1s2 bradycardic, no m/r/g PULM:  course upper airway sounds, no distress on RA, no wheezing GI: soft, non-tender Extremities: cool/dry, no edema  Skin: no rashes or lesions   Resolved Hospital Problem list     Assessment & Plan:    Acute Encephalopathy Per family pt is at baseline alert and conversant  No acute findings on CTH Ammonia WNL -possibly 2/2 developing pneumonia/sepsis -currently protecting his airway, on room air, is full code on hospice -if he were to  worsen please re-engage PCCM -recommend palliative consult   Hypothermia and Bradycardia Chronic issue, last admission 10/2021 Cardiology and EP saw patient 10/26/21 and recommended no PPM in the absence of  symptoms -monitor on telemetry -continued re-warming   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 Creatinine 1.6 -monitor renal function and UOP  Stable for admission to progressive, PCCM will continue to follow with you  Best Practice (right click and "Reselect all SmartList Selections" daily)   Per primary  \ Labs   CBC: Recent Labs  Lab 03/01/22 1358 03/01/22 1408  WBC 4.5  --   NEUTROABS 1.4*  --   HGB 10.5* 10.9*  HCT 33.7* 32.0*  MCV 92.8  --   PLT 152  --     Basic Metabolic Panel: Recent Labs  Lab 03/01/22 1358 03/01/22 1408  NA 140 138  K 4.4 4.3  CL 104  --   CO2 28  --   GLUCOSE 101*  --   BUN 29*  --   CREATININE 1.63*  --   CALCIUM 9.1  --    GFR: Estimated Creatinine Clearance: 46.1 mL/min (A) (by C-G formula based on SCr of 1.63 mg/dL (H)). Recent Labs  Lab 03/01/22 1358  WBC 4.5  LATICACIDVEN 1.2    Liver Function Tests: Recent Labs  Lab 03/01/22 1358  AST 16  ALT 16  ALKPHOS 63  BILITOT 0.2*  PROT 7.0  ALBUMIN  3.2*   No results for input(s): "LIPASE", "AMYLASE" in the last 168 hours. Recent Labs  Lab 03/01/22 1358  AMMONIA 23    ABG    Component Value Date/Time   HCO3 28.7 (H) 03/01/2022 1408   TCO2 30 03/01/2022 1408   O2SAT 79 03/01/2022 1408     Coagulation Profile: Recent Labs  Lab 03/01/22 1358  INR 1.1    Cardiac Enzymes: No results for input(s): "CKTOTAL", "CKMB", "CKMBINDEX", "TROPONINI" in the last 168 hours.  HbA1C: Hgb A1c MFr Bld  Date/Time Value Ref Range Status  10/24/2021 05:01 PM 5.6 4.8 - 5.6 % Final    Comment:    (NOTE) Pre diabetes:          5.7%-6.4%  Diabetes:              >6.4%  Glycemic control for   <7.0% adults with diabetes     CBG: Recent Labs  Lab 03/01/22 1344  GLUCAP 95     Review of Systems:   Unable to obtain  Past Medical History:  He,  has a past medical history of Dementia (HCC), Hypertension, and Uses feeding tube.   Surgical History:   Past Surgical History:  Procedure Laterality Date   CATARACT EXTRACTION, BILATERAL     KNEE SURGERY       Social History:   reports that he has been smoking. He has never used smokeless tobacco. He reports current alcohol use. He reports that he does not use drugs.   Family History:  His family history is not on file.   Allergies No Known Allergies   Home Medications  Prior to Admission medications   Medication Sig Start Date End Date Taking? Authorizing Provider  acetaminophen (TYLENOL) 160 MG/5ML solution Place 10.2 mLs (325 mg total) into feeding tube every 6 (six) hours as needed for mild pain, headache or fever. Patient not taking: Reported on 10/23/2021 05/19/21   Lonia Blood, MD  famotidine (PEPCID) 20 MG tablet Place 20 mg into feeding tube at bedtime. 03/15/21   [provider]  Nutritional Supplements (FEEDING SUPPLEMENT, JEVITY 1.5 CAL/FIBER,) LIQD Place 1,000 mLs into feeding tube continuous. Patient taking differently: 1,000 mLs by Per J Tube route continuous. In the Morning & Afternoon for Nutrition. Cal @ 70 ml/hr continuous hours X 14 hours/day (Start @ 1800, Stop @ 0800 via PEG (total 1000 ml formula X 24 hours) 04/26/21   Dahal, Melina Schools, MD  Nutritional Supplements (FEEDING SUPPLEMENT, PROSOURCE TF,) liquid Place 45 mLs into feeding tube 2 (two) times daily. Patient not taking: Reported on 10/23/2021 04/26/21   Lorin Glass, MD  polyethylene glycol powder (GLYCOLAX/MIRALAX) 17 GM/SCOOP powder Place 17 g into feeding tube See admin instructions. Mix 17 grams of powder as directed and give, per G-Tube, once a day 03/15/21   [provider]  QUEtiapine (SEROQUEL) 25 MG tablet Place 1 tablet (25 mg total) into feeding tube at bedtime. 05/19/21   Lonia Blood, MD  Water  For Irrigation, Sterile (FREE WATER) SOLN Place 200 mLs into feeding tube every 4 (four) hours. 05/19/21   Lonia Blood, MD     Critical care time: n/a    Darcella Gasman Jack Andonian, PA-C [facility] See Amion for pager If no response to pager , please call [phone] until 7pm After 7:00 pm call Elink  [phone]

## 2022-03-01 NOTE — ED Notes (Signed)
Dr Lynelle Doctor notified of hypothermia; bair hugger applied

## 2022-03-01 NOTE — Progress Notes (Signed)
Pharmacy Antibiotic Note  Jack Rivas is a 76 y.o. male admitted on 03/01/2022 presenting AMS, concern for aspiration.  Pharmacy has been consulted for Unasyn dosing.  Plan: Unasyn 3g IV every 8 hours Monitor renal function, clinical progression and LOT  Height: 6\' 3"  (190.5 cm) Weight: 85 kg (187 lb 6.3 oz) IBW/kg (Calculated) : 84.5  Temp (24hrs), Avg:94.5 F (34.7 C), Min:94.5 F (34.7 C), Max:94.5 F (34.7 C)  Recent Labs  Lab 03/01/22 1358  WBC 4.5  CREATININE 1.63*  LATICACIDVEN 1.2    Estimated Creatinine Clearance: 46.1 mL/min (A) (by C-G formula based on SCr of 1.63 mg/dL (H)).    No Known Allergies  03/03/22, PharmD Clinical Pharmacist ED Pharmacist Phone # [phone] 03/01/2022 5:36 PM

## 2022-03-01 NOTE — ED Triage Notes (Signed)
Pt bib ems from [facility]; fall last night, AMS x 1 day; bradycardia today, HR 40s, irreg, BP 118/74, sats 97% RA, rhonchi noted; cbg 128; pt not on thinners; pt non-ambulatory at baseline; hx dementia, but more altered than normal; per staff, pt normally responds to commands, verbal; alert to name only today

## 2022-03-01 NOTE — ED Notes (Signed)
Dr. Yates at bedside.  

## 2022-03-01 NOTE — ED Notes (Signed)
Dr. Ophelia Charter notified of continued hypothermia; Bair Hugger continued

## 2022-03-01 NOTE — ED Provider Notes (Signed)
[facility] EMERGENCY DEPARTMENT Provider Note   CSN: [id number] Arrival date & time: 03/01/22  1300     History  Chief complaint: Altered mental status  Jack Rivas is a 76 y.o. male.  HPI   Patient has history of dementia, hypertension, feeding tube use.  Patient presents to the ED for evaluation of altered mental status.  According to staff at the nursing facility the patient had a fall last night.  He has been altered for the last day.  Patient does have a history of dementia but normally responds to commands and is talkative.  Today the patient has been somnolent and mostly just answering to name only.  He has had some rhonchorous type respirations.  No known fevers.  EMS did note that his heart rate was decreased but he had normal sugar normal blood pressure.  Oxygenation has been normal  Home Medications Prior to Admission medications   Medication Sig Start Date End Date Taking? Authorizing Provider  acetaminophen (TYLENOL) 160 MG/5ML solution Place 10.2 mLs (325 mg total) into feeding tube every 6 (six) hours as needed for mild pain, headache or fever. Patient not taking: Reported on 10/23/2021 05/19/21   Cherene Altes, MD  famotidine (PEPCID) 20 MG tablet Place 20 mg into feeding tube at bedtime. 03/15/21   [provider]  Nutritional Supplements (FEEDING SUPPLEMENT, JEVITY 1.5 CAL/FIBER,) LIQD Place 1,000 mLs into feeding tube continuous. Patient taking differently: 1,000 mLs by Per J Tube route continuous. In the Morning & Afternoon for Nutrition. Cal @ 70 ml/hr continuous hours X 14 hours/day (Start @ 1800, Stop @ 0800 via PEG (total 1000 ml formula X 24 hours) 04/26/21   Dahal, Marlowe Aschoff, MD  Nutritional Supplements (FEEDING SUPPLEMENT, PROSOURCE TF,) liquid Place 45 mLs into feeding tube 2 (two) times daily. Patient not taking: Reported on 10/23/2021 04/26/21   Terrilee Croak, MD  polyethylene glycol powder (GLYCOLAX/MIRALAX) 17 GM/SCOOP powder Place  17 g into feeding tube See admin instructions. Mix 17 grams of powder as directed and give, per G-Tube, once a day 03/15/21   [provider]  QUEtiapine (SEROQUEL) 25 MG tablet Place 1 tablet (25 mg total) into feeding tube at bedtime. 05/19/21   Cherene Altes, MD  Water For Irrigation, Sterile (FREE WATER) SOLN Place 200 mLs into feeding tube every 4 (four) hours. 05/19/21   Cherene Altes, MD      Allergies    Patient has no known allergies.    Review of Systems   Review of Systems  Physical Exam Updated Vital Signs BP (!) 141/67   Pulse (!) 38   Temp (!) 94.5 F (34.7 C) (Rectal)   Resp 12   Ht 1.905 m (6\' 3" )   Wt 85 kg   SpO2 100%   BMI 23.42 kg/m  Physical Exam Vitals and nursing note reviewed.  Constitutional:      Appearance: He is well-developed.     Comments: Elderly, frail  HENT:     Head: Normocephalic and atraumatic.     Right Ear: External ear normal.     Left Ear: External ear normal.     Mouth/Throat:     Mouth: Mucous membranes are dry.  Eyes:     General: No scleral icterus.       Right eye: No discharge.        Left eye: No discharge.     Conjunctiva/sclera: Conjunctivae normal.  Neck:     Trachea: No tracheal deviation.  Cardiovascular:     Rate and Rhythm: Normal rate and regular rhythm.  Pulmonary:     Effort: Pulmonary effort is normal. No respiratory distress.     Breath sounds: No stridor. Rhonchi present. No wheezing or rales.  Abdominal:     General: Bowel sounds are normal. There is no distension.     Palpations: Abdomen is soft.     Tenderness: There is no abdominal tenderness. There is no guarding or rebound.  Musculoskeletal:        General: No tenderness or deformity.     Cervical back: Neck supple.     Right lower leg: No edema.     Left lower leg: No edema.  Skin:    General: Skin is warm and dry.     Findings: No rash.  Neurological:     General: No focal deficit present.     Mental Status: He is lethargic.      GCS: GCS eye subscore is 2. GCS verbal subscore is 2. GCS motor subscore is 5.     Cranial Nerves: No cranial nerve deficit (no facial droop,).     Motor: Weakness present. No seizure activity.     Comments: Patient does mumble when I speak to him, he is difficult to understand, does not follow commands  Psychiatric:        Mood and Affect: Mood normal.     ED Results / Procedures / Treatments   Labs (all labs ordered are listed, but only abnormal results are displayed) Labs Reviewed  APTT - Abnormal; Notable for the following components:      Result Value   aPTT 42 (*)    All other components within normal limits  CBC - Abnormal; Notable for the following components:   RBC 3.63 (*)    Hemoglobin 10.5 (*)    HCT 33.7 (*)    All other components within normal limits  DIFFERENTIAL - Abnormal; Notable for the following components:   Neutro Abs 1.4 (*)    Eosinophils Absolute 0.7 (*)    All other components within normal limits  COMPREHENSIVE METABOLIC PANEL - Abnormal; Notable for the following components:   Glucose, Bld 101 (*)    BUN 29 (*)    Creatinine, Ser 1.63 (*)    Albumin 3.2 (*)    Total Bilirubin 0.2 (*)    GFR, Estimated 43 (*)    All other components within normal limits  TSH - Abnormal; Notable for the following components:   TSH 9.132 (*)    All other components within normal limits  I-STAT VENOUS BLOOD GAS, ED - Abnormal; Notable for the following components:   Bicarbonate 28.7 (*)    Acid-Base Excess 4.0 (*)    Calcium, Ion 1.08 (*)    HCT 32.0 (*)    Hemoglobin 10.9 (*)    All other components within normal limits  RESP PANEL BY RT-PCR (FLU A&B, COVID) ARPGX2  CULTURE, BLOOD (ROUTINE X 2)  CULTURE, BLOOD (ROUTINE X 2)  PROTIME-INR  ETHANOL  RAPID URINE DRUG SCREEN, HOSP PERFORMED  URINALYSIS, ROUTINE W REFLEX MICROSCOPIC  AMMONIA  LACTIC ACID, PLASMA  LACTIC ACID, PLASMA  T4, FREE  CORTISOL  CBG MONITORING, ED    EKG EKG  Interpretation  Date/Time:  Wednesday March 01 2022 13:11:10 EDT Ventricular Rate:  42 PR Interval:    QRS Duration: 156 QT Interval:  483 QTC Calculation: 414 R Axis:   61 Text Interpretation: Atrial fibrillation Nonspecific intraventricular conduction delay  Borderline repolarization abnormality Since last tracing rate faster Confirmed by Dorie Rank [phone]) on 03/01/2022 1:27:49 PM  Radiology CT HEAD WO CONTRAST  Result Date: 03/01/2022 CLINICAL DATA:  Altered mental status. EXAM: CT HEAD WITHOUT CONTRAST CT CERVICAL SPINE WITHOUT CONTRAST TECHNIQUE: Multidetector CT imaging of the head and cervical spine was performed following the standard protocol without intravenous contrast. Multiplanar CT image reconstructions of the cervical spine were also generated. RADIATION DOSE REDUCTION: This exam was performed according to the departmental dose-optimization program which includes automated exposure control, adjustment of the mA and/or kV according to patient size and/or use of iterative reconstruction technique. COMPARISON:  May 10, 2021. FINDINGS: CT HEAD FINDINGS Brain: No evidence of acute infarction, hemorrhage, hydrocephalus, extra-axial collection or mass lesion/mass effect. Vascular: No hyperdense vessel or unexpected calcification. Skull: Normal. Negative for fracture or focal lesion. Sinuses/Orbits: Complete opacification of right maxillary sinus and right sphenoid sinus consistent with sinusitis. Other: None. CT CERVICAL SPINE FINDINGS Alignment: Mild grade 1 anterolisthesis of C7-T1 is noted secondary to posterior facet joint hypertrophy. Skull base and vertebrae: No acute fracture. There is increased left superior articular process of C3 as well as involving the left-sided articular process of C2, most consistent with significantly worsening degenerative change, although focal infection cannot be excluded. Soft tissues and spinal canal: No prevertebral fluid or swelling. No visible canal  hematoma. Disc levels: Severe degenerative disc disease is noted at C3-4, C4-5 C6-7 and C7-T1. Status post surgical interbody fusion of C5-6. Upper chest: Negative. Other: None. IMPRESSION: No acute intracranial abnormality is noted. Probable acute right maxillary and sphenoid sinusitis is noted. No fracture is noted in the cervical spine. Multilevel degenerative changes are noted. There is noted increased destruction of the adjacent articular processes of the left-sided C2-3 facet joint, most consistent with worsening degenerative joint disease, although infection cannot be excluded. MRI may be performed for further evaluation. Electronically Signed   By: Marijo Conception M.D.   On: 03/01/2022 15:06   CT Cervical Spine Wo Contrast  Result Date: 03/01/2022 CLINICAL DATA:  Altered mental status. EXAM: CT HEAD WITHOUT CONTRAST CT CERVICAL SPINE WITHOUT CONTRAST TECHNIQUE: Multidetector CT imaging of the head and cervical spine was performed following the standard protocol without intravenous contrast. Multiplanar CT image reconstructions of the cervical spine were also generated. RADIATION DOSE REDUCTION: This exam was performed according to the departmental dose-optimization program which includes automated exposure control, adjustment of the mA and/or kV according to patient size and/or use of iterative reconstruction technique. COMPARISON:  May 10, 2021. FINDINGS: CT HEAD FINDINGS Brain: No evidence of acute infarction, hemorrhage, hydrocephalus, extra-axial collection or mass lesion/mass effect. Vascular: No hyperdense vessel or unexpected calcification. Skull: Normal. Negative for fracture or focal lesion. Sinuses/Orbits: Complete opacification of right maxillary sinus and right sphenoid sinus consistent with sinusitis. Other: None. CT CERVICAL SPINE FINDINGS Alignment: Mild grade 1 anterolisthesis of C7-T1 is noted secondary to posterior facet joint hypertrophy. Skull base and vertebrae: No acute  fracture. There is increased left superior articular process of C3 as well as involving the left-sided articular process of C2, most consistent with significantly worsening degenerative change, although focal infection cannot be excluded. Soft tissues and spinal canal: No prevertebral fluid or swelling. No visible canal hematoma. Disc levels: Severe degenerative disc disease is noted at C3-4, C4-5 C6-7 and C7-T1. Status post surgical interbody fusion of C5-6. Upper chest: Negative. Other: None. IMPRESSION: No acute intracranial abnormality is noted. Probable acute right maxillary and sphenoid sinusitis is noted. No  fracture is noted in the cervical spine. Multilevel degenerative changes are noted. There is noted increased destruction of the adjacent articular processes of the left-sided C2-3 facet joint, most consistent with worsening degenerative joint disease, although infection cannot be excluded. MRI may be performed for further evaluation. Electronically Signed   By: Lupita Raider M.D.   On: 03/01/2022 15:06   DG Chest Portable 1 View  Result Date: 03/01/2022 CLINICAL DATA:  Weakness EXAM: PORTABLE CHEST 1 VIEW COMPARISON:  Chest x-ray dated October 23, 2021; chest CT dated October 24, 2021 FINDINGS: The heart size and mediastinal contours are within normal limits. Unchanged unchanged calcification overlying the left mid lung, correlate with soft tissue calcification on prior chest CT. Both lungs are clear. The visualized skeletal structures are unremarkable. IMPRESSION: No active disease. Electronically Signed   By: Allegra Lai M.D.   On: 03/01/2022 13:30    Procedures Procedures    Medications Ordered in ED Medications  sodium chloride 0.9 % bolus 500 mL (0 mLs Intravenous Stopped 03/01/22 1511)    Followed by  0.9 %  sodium chloride infusion (100 mL/hr Intravenous New Bag/Given 03/01/22 1511)    ED Course/ Medical Decision Making/ A&P Clinical Course as of 03/01/22 1549  Wed Mar 01, 2022   1335 Patient noted to be hypothermic.  Will add on blood cultures and lactic acid level.  Add on TSH [JK]  1450 I-Stat venous blood gas, [facility] ED only)(!) Venous blood gas without acidosis [JK]  1451 Comprehensive metabolic panel(!) Metabolic panel does show elevated BUN and creatinine [JK]  1451 CBC(!) No leukocytosis, hemoglobin similar to previous values [JK]  1451 Urinalysis, Routine w reflex microscopic Urine, In & Out Cath Urinalysis without signs of infection [JK]  1451 DG Chest Portable 1 View Chest x-ray without acute findings [JK]  1521 Prior records reviewed.  Hypothermia and bradycardia noted previously.  Patient was seen by cardiology including EP and they did not recommend any further intervention [JK]  1522 TSH elevated consistent with hypothyroidism [JK]  1523 DIscussed with [facility].  Pt is full code.  Pt is followed by hospice for dementia and chronic dysphagia.   [JK]  1525 I attempted to call the patient's daughter.  No answer [JK]  1543 DIscussed with Dr Ophelia Charter regarding admission [JK]    Clinical Course User Index [JK] Linwood Dibbles, MD                           Medical Decision Making Problems Addressed: Altered mental status, unspecified altered mental status type: acute illness or injury that poses a threat to life or bodily functions Bradycardia: chronic illness or injury Elevated TSH: acute illness or injury Hypothermia, initial encounter: acute illness or injury  Amount and/or Complexity of Data Reviewed Labs: ordered. Decision-making details documented in ED Course. Radiology: ordered. Decision-making details documented in ED Course.  Risk Prescription drug management. Decision regarding hospitalization.   Pt presents with altered mental status. Hx of demential aspiration pneumonia.  Pt also noted to have hypothermia and bradycardia during his previous admission however that did improve.  Patient notably bradycardic hypothermic again.  Sitter the  possibility of sepsis but no signs of any lactic acidosis no leukocytosis noted.  With his altered mental status CT scan was performed and there is no definite stroke noted on CT scan.  There is some question of sinusitis.  Its possible hypothyroidism could be contributing to his symptoms but the TSH is  not severely elevated.  It is possible patient could have an occult stroke accounting for his mental status.  I will add on a cortisol level free T4.  With his persistent altered mental status.  I did consult with hospice to understand the goals of care as I was unable to contact family.  Hospice indicates patient is a full code.  I will consult the medical service for admission for observation and further evaluation        Final Clinical Impression(s) / ED Diagnoses Final diagnoses:  Bradycardia  Elevated TSH  Altered mental status, unspecified altered mental status type  Hypothermia, initial encounter    Rx / DC Orders ED Discharge Orders     None         Linwood Dibbles, MD 03/01/22 1550

## 2022-03-02 DIAGNOSIS — A419 Sepsis, unspecified organism: Secondary | ICD-10-CM | POA: Diagnosis not present

## 2022-03-02 DIAGNOSIS — R6521 Severe sepsis with septic shock: Secondary | ICD-10-CM | POA: Diagnosis not present

## 2022-03-02 DIAGNOSIS — T68XXXA Hypothermia, initial encounter: Secondary | ICD-10-CM | POA: Diagnosis not present

## 2022-03-02 LAB — BLOOD CULTURE ID PANEL (REFLEXED) - BCID2

## 2022-03-02 LAB — BASIC METABOLIC PANEL
Anion gap: 7 (ref 5–15)
BUN: 24 mg/dL — ABNORMAL HIGH (ref 8–23)
CO2: 27 mmol/L (ref 22–32)
Calcium: 9.3 mg/dL (ref 8.9–10.3)
Chloride: 106 mmol/L (ref 98–111)
Creatinine, Ser: 1.42 mg/dL — ABNORMAL HIGH (ref 0.61–1.24)
GFR, Estimated: 51 mL/min — ABNORMAL LOW (ref >60)
Glucose, Bld: 58 mg/dL — ABNORMAL LOW (ref 70–99)
Potassium: 4.8 mmol/L (ref 3.5–5.1)
Sodium: 140 mmol/L (ref 135–145)

## 2022-03-02 LAB — CBC
HCT: 33.2 % — ABNORMAL LOW (ref 39.0–52.0)
Hemoglobin: 10.4 g/dL — ABNORMAL LOW (ref 13.0–17.0)
MCH: 29.1 pg (ref 26.0–34.0)
MCHC: 31.3 g/dL (ref 30.0–36.0)
MCV: 92.7 fL (ref 80.0–100.0)
Platelets: 159 10*3/uL (ref 150–400)
RBC: 3.58 MIL/uL — ABNORMAL LOW (ref 4.22–5.81)
RDW: 13.8 % (ref 11.5–15.5)
WBC: 5.2 10*3/uL (ref 4.0–10.5)
nRBC: 0 % (ref 0.0–0.2)

## 2022-03-02 LAB — PROCALCITONIN: Procalcitonin: <0.1 ng/mL

## 2022-03-02 LAB — CBG MONITORING, ED: Glucose-Capillary: 72 mg/dL (ref 70–99)

## 2022-03-02 MED ORDER — DEXTROSE-NACL 5-0.9 % IV SOLN: INTRAVENOUS | Status: DC

## 2022-03-02 MED ORDER — DEXTROSE IN LACTATED RINGERS 5 % IV SOLN: INTRAVENOUS | Status: DC

## 2022-03-02 MED ORDER — FOOD THICKENER (SIMPLYTHICK)
1.0000 | ORAL | Status: DC | PRN
Start: 2022-03-02 — End: 2022-03-07

## 2022-03-02 MED ORDER — LORAZEPAM 2 MG/ML IJ SOLN
0.5000 mg | Freq: Four times a day (QID) | INTRAMUSCULAR | Status: DC | PRN
  Administered 2022-03-02 – 2022-03-06 (×7): 0.5 mg via INTRAVENOUS
  Filled 2022-03-02 (×8): qty 1

## 2022-03-02 NOTE — Progress Notes (Signed)
NAME:  Jack Rivas, MRN:  [id number], DOB:  [birth year], LOS: 1 ADMISSION DATE:  03/01/2022, CONSULTATION DATE:  03/02/22 REFERRING MD:  Ophelia Charter, CHIEF COMPLAINT:  bradycardia   History of Present Illness:  Jack Rivas is a 76 y/o M with PMH of Dementia and HTN and G tube who presented from SNF with AMS and bradycardia without hypotension.  He is in hospice but is full code.  Appears that bradycardia and hypothermia are a chronic issue for him.  Family is also feeding him po food.  Work up in the ED significant for creatinine 1.6, UDS negative., lactic acid 1.2, CXR negative, T4 WNL.  HR in the 40's in the ED, temp 94.38F  Pertinent  Medical History   has a past medical history of Dementia (HCC), Hypertension, and Uses feeding tube.   Significant Hospital Events: Including procedures, antibiotic start and stop dates in addition to other pertinent events   8/16 presented to the ED with Ams and bradycardia 03/02/2022 pulmonary critical care will sign off  Interim History / Subjective:  More awake and interactive  Objective   Blood pressure 128/72, pulse (!) 37, temperature (!) 97 F (36.1 C), temperature source Axillary, resp. rate 16, height 6\' 3"  (1.905 m), weight 85 kg, SpO2 100 %.        Intake/Output Summary (Last 24 hours) at 03/02/2022 0757 Last data filed at 03/01/2022 2058 Gross per 24 hour  Intake 158.74 ml  Output --  Net 158.74 ml   Filed Weights   03/01/22 1316  Weight: 85 kg    General: Elderly male who is pleasantly confused HEENT: MM pink/moist no JVD or lymphadenopathy is appreciated Neuro: Orientated to person and place CV: Heart sounds are distant PULM: Diminished in the bases\ GI: soft, bsx4 active  GU: Voids Extremities: warm/dry, negative edema  Skin: no rashes or lesions    Resolved Hospital Problem list     Assessment & Plan:    Acute Encephalopathy Per family pt is at baseline alert and conversant  No acute findings on CTH Ammonia  WNL  Improved pulmonary critical care will sign off at this time   Hypothermia and Bradycardia Chronic issue, last admission 10/2021 Cardiology and EP saw patient 10/26/21 and recommended no PPM in the absence of  symptoms Continue to monitor on telemetry Can come off Bair hugger   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 Lab Results  Component Value Date   CREATININE 1.42 (H) 03/02/2022   CREATININE 1.63 (H) 03/01/2022   CREATININE 0.89 10/27/2021    Creatinine 1.6 Per primary     Stable for admission to progressive, PCCM will continue to follow with you  Best Practice (right click and "Reselect all SmartList Selections" daily)   Per primary   Labs   CBC: Recent Labs  Lab 03/01/22 1358 03/01/22 1408 03/02/22 0346  WBC 4.5  --  5.2  NEUTROABS 1.4*  --   --   HGB 10.5* 10.9* 10.4*  HCT 33.7* 32.0* 33.2*  MCV 92.8  --  92.7  PLT 152  --  159    Basic Metabolic Panel: Recent Labs  Lab 03/01/22 1358 03/01/22 1408 03/02/22 0346  NA 140 138 140  K 4.4 4.3 4.8  CL 104  --  106  CO2 28  --  27  GLUCOSE 101*  --  58*  BUN 29*  --  24*  CREATININE 1.63*  --  1.42*  CALCIUM 9.1  --  9.3   GFR: Estimated  Creatinine Clearance: 52.9 mL/min (A) (by C-G formula based on SCr of 1.42 mg/dL (H)). Recent Labs  Lab 03/01/22 1358 03/01/22 1728 03/02/22 0346  PROCALCITON  --  <0.10 <0.10  WBC 4.5  --  5.2  LATICACIDVEN 1.2  --   --     Liver Function Tests: Recent Labs  Lab 03/01/22 1358  AST 16  ALT 16  ALKPHOS 63  BILITOT 0.2*  PROT 7.0  ALBUMIN 3.2*   No results for input(s): "LIPASE", "AMYLASE" in the last 168 hours. Recent Labs  Lab 03/01/22 1358  AMMONIA 23    ABG    Component Value Date/Time   HCO3 28.7 (H) 03/01/2022 1408   TCO2 30 03/01/2022 1408   O2SAT 79 03/01/2022 1408     Coagulation Profile: Recent Labs  Lab 03/01/22 1358  INR 1.1    Cardiac Enzymes: No results for input(s): "CKTOTAL", "CKMB", "CKMBINDEX", "TROPONINI" in the last  168 hours.  HbA1C: Hgb A1c MFr Bld  Date/Time Value Ref Range Status  10/24/2021 05:01 PM 5.6 4.8 - 5.6 % Final    Comment:    (NOTE) Pre diabetes:          5.7%-6.4%  Diabetes:              >6.4%  Glycemic control for   <7.0% adults with diabetes     CBG: Recent Labs  Lab 03/01/22 1344 03/02/22 0527  GLUCAP 95 72     Critical care time: n/a    Brett Canales Hadlee Burback ACNP Acute Care Nurse Practitioner [facility] Pulmonary/Critical Care Please consult Amion 03/02/2022, 7:57 AM

## 2022-03-02 NOTE — Progress Notes (Addendum)
Clinical/Bedside Swallow Evaluation Patient Details  Name: Rockie Schnoor MRN: [id number] Date of Birth: 12/06/1945  Today's Date: 03/02/2022 Time: SLP Start Time (ACUTE ONLY): 0805 SLP Stop Time (ACUTE ONLY): 0825 SLP Time Calculation (min) (ACUTE ONLY): 20 min  Past Medical History:  Past Medical History:  Diagnosis Date   Dementia (HCC)    Hypertension    Uses feeding tube    Past Surgical History:  Past Surgical History:  Procedure Laterality Date   CATARACT EXTRACTION, BILATERAL     KNEE SURGERY     HPI:  76 yo male adm to [facility] with sepsis, presumed asp pna.  Swallow eval ordered. CXR negative.  H/O PEG placement,dysphagia, dementia on hospice.   SLE not indicated as pt has h/o dementia and clinically judged to be the same as April 2023 visit.    Assessment / Plan / Recommendation  Clinical Impression  Pt familiar to this SLP from prior admit in April 2023 when he aspirated [employer].  MBS completed at that time showing laryngeal penetration due to delayed swallow and retention.  Recommend was dys1/nectar and allow tsps of thin water and single ice chips.  Medicine with puree - whole.  Today pt is impulsive, directive but cooperative with po intake.  He repeatedly states, "Give me the hamburger and potato chips".  His voice is at times wet - and has h/o silent aspiration of secretions.  Due to his impulsivity, overt cough with thin and large boluses of nectar concerning for aspiration - recommend conservative diet of dys1/nectar with strict precautions.  Pt's impulsivity and known dysphagia increases his risk of aspiration.  Will follow briefly for family education, tolerance - would not recommend repeat instrumental evaluation at this time.  No family present to educate- informed RN and left precaution sign with her. Thanks for this consult. SLP Visit Diagnosis: Dysphagia, oropharyngeal phase (R13.12)    Aspiration Risk  Moderate aspiration risk    Diet Recommendation Dysphagia 1  (Puree);Nectar-thick liquid (tsps thin water ok, single ice chips ok)   Liquid Administration via: Cup;No straw Medication Administration: Whole meds with puree Supervision: Full supervision/cueing for compensatory strategies Compensations: Slow rate;Small sips/bites Postural Changes: Seated upright at 90 degrees;Remain upright for at least 30 minutes after po intake    Other  Recommendations Oral Care Recommendations: Oral care BID Other Recommendations: Order thickener from pharmacy;Have oral suction available    Recommendations for follow up therapy are one component of a multi-disciplinary discharge planning process, led by the attending physician.  Recommendations may be updated based on patient status, additional functional criteria and insurance authorization.  Follow up Recommendations Follow physician's recommendations for discharge plan and follow up therapies      Assistance Recommended at Discharge Frequent or constant Supervision/Assistance  Functional Status Assessment Patient has had a recent decline in their functional status and/or demonstrates limited ability to make significant improvements in function in a reasonable and predictable amount of time  Frequency and Duration min 1 x/week  1 week       Prognosis Prognosis for Safe Diet Advancement: Guarded Barriers to Reach Goals: Time post onset;Behavior      Swallow Study   General Date of Onset: 03/02/22 HPI: 76 yo male adm to [facility] with sepsis, presumed asp pna.  Swallow eval ordered. CXR negative.  H/O PEG placement,dysphagia, dementia on hospice. Type of Study: Bedside Swallow Evaluation Previous Swallow Assessment: mbs 10/2021 rec puree/nectar, tsps thin and ice chips, no asp but delayed swallow and aspiration of secretions Diet  Prior to this Study: NPO Temperature Spikes Noted: No Respiratory Status: Room air History of Recent Intubation: No Behavior/Cognition: Alert;Cooperative;Impulsive Oral Cavity  Assessment: Within Functional Limits Oral Care Completed by SLP: No Oral Cavity - Dentition: Adequate natural dentition Vision: Functional for self-feeding Self-Feeding Abilities: Needs assist Patient Positioning: Upright in bed Baseline Vocal Quality: Wet Volitional Cough: Cognitively unable to elicit Volitional Swallow: Unable to elicit    Oral/Motor/Sensory Function Overall Oral Motor/Sensory Function: Generalized oral weakness (pt is dysarthric)   Ice Chips Ice chips: Within functional limits Presentation: Spoon   Thin Liquid Thin Liquid: Impaired Presentation: Cup;Spoon Pharyngeal  Phase Impairments: Cough - Immediate    Nectar Thick Nectar Thick Liquid: Impaired Presentation: Cup;Spoon Pharyngeal Phase Impairments: Cough - Immediate (immediate cough with large boluses)   Honey Thick Honey Thick Liquid: Not tested   Puree Puree: Within functional limits   Solid     Solid: Within functional limits      Chales Abrahams 03/02/2022,10:01 AM   Rolena Infante, MS [facility] SLP Acute Rehab Services Office [phone] Pager [phone]

## 2022-03-02 NOTE — Progress Notes (Signed)
PHARMACY - PHYSICIAN COMMUNICATION CRITICAL VALUE ALERT - BLOOD CULTURE IDENTIFICATION (BCID)  Jack Rivas is an 76 y.o. male who presented to [facility] on 03/01/2022 with a chief complaint of AMS w/ suspected aspiration pneumonia  Assessment:  blood culture shows gram positive cocci in clusters growing from one of 4 vials (aerobic vial). Staphylococcus species  Name of physician (or Provider) Contacted: Carollee Herter MD  Current antibiotics: Unasyn 3 grams iv q8h; Azithromycin 500 mg iv qday  Changes to prescribed antibiotics recommended:  Continue on Unasyn  Results for orders placed or performed during the hospital encounter of 03/01/22  Blood Culture ID Panel (Reflexed) (Collected: 03/01/2022  5:17 PM)  Result Value Ref Range   Enterococcus faecalis NOT DETECTED NOT DETECTED   Enterococcus Faecium NOT DETECTED NOT DETECTED   Listeria monocytogenes NOT DETECTED NOT DETECTED   Staphylococcus species DETECTED (A) NOT DETECTED   Staphylococcus aureus (BCID) NOT DETECTED NOT DETECTED   Staphylococcus epidermidis NOT DETECTED NOT DETECTED   Staphylococcus lugdunensis NOT DETECTED NOT DETECTED   Streptococcus species NOT DETECTED NOT DETECTED   Streptococcus agalactiae NOT DETECTED NOT DETECTED   Streptococcus pneumoniae NOT DETECTED NOT DETECTED   Streptococcus pyogenes NOT DETECTED NOT DETECTED   A.calcoaceticus-baumannii NOT DETECTED NOT DETECTED   Bacteroides fragilis NOT DETECTED NOT DETECTED   Enterobacterales NOT DETECTED NOT DETECTED   Enterobacter cloacae complex NOT DETECTED NOT DETECTED   Escherichia coli NOT DETECTED NOT DETECTED   Klebsiella aerogenes NOT DETECTED NOT DETECTED   Klebsiella oxytoca NOT DETECTED NOT DETECTED   Klebsiella pneumoniae NOT DETECTED NOT DETECTED   Proteus species NOT DETECTED NOT DETECTED   Salmonella species NOT DETECTED NOT DETECTED   Serratia marcescens NOT DETECTED NOT DETECTED   Haemophilus influenzae NOT DETECTED NOT DETECTED   Neisseria  meningitidis NOT DETECTED NOT DETECTED   Pseudomonas aeruginosa NOT DETECTED NOT DETECTED   Stenotrophomonas maltophilia NOT DETECTED NOT DETECTED   Candida albicans NOT DETECTED NOT DETECTED   Candida auris NOT DETECTED NOT DETECTED   Candida glabrata NOT DETECTED NOT DETECTED   Candida krusei NOT DETECTED NOT DETECTED   Candida parapsilosis NOT DETECTED NOT DETECTED   Candida tropicalis NOT DETECTED NOT DETECTED   Cryptococcus neoformans/gattii NOT DETECTED NOT DETECTED    Johara Lodwick BS, PharmD, BCPS Clinical Pharmacist 03/02/2022 8:28 PM  Contact: [phone] after 3 PM  "Be curious, not judgmental..." -Debbora Dus

## 2022-03-02 NOTE — Progress Notes (Signed)
PROGRESS NOTE    Jack Rivas  [id number] DOB: 1946-05-25 DOA: 03/01/2022 PCP: Patient, No Pcp Per     Brief Narrative:  History of dementia, baseline nonambulatory , normally respond to commands and verbal  at baseline, h/o dysphagia status post PEG tube placement, sent from SNF to ED due to fall last night, altered mental status x1 day, alert to name only ,+ AKI and bradycardia, significant significant rhonchi left side initially on presentation, cxr no acute findings , though he appear dehydrated,  Per daughter, He is supposed to have a few bites of PO intake with feeding assistance in addition to tube feeds but "they bring him a full tray" and leave it for him at the facility .  He does have some coughing/choking/sputtering with PO intake.  Subjective:   Awake, nonverbal, does not follow command, upper airways congested gurgling and intermittent weak cough, on room air Appear dehydrated, on ivf, bradycardia, sbp in the 90's he would allow me to exam his peg tube  Assessment & Plan:  Principal Problem:   Septic shock due to undetermined organism [facility]) Active Problems:   Aspiration pneumonia (HCC)   Dementia ([place])   Dysphagia   Chronic kidney disease, stage 3a ([place])   Bradycardia   Altered mental status   Hypothermia   Acute kidney injury superimposed on chronic kidney disease ([place])    Acute metabolic encephalopathy/bradycardia/hypothermia/dehydration/AKI/hypoglycemia/possible aspiration pneumonia Resume tube feeds, Repeat speech eval Start D5 LR Continue antibiotic for now, low procal , repeat cxr after hydration, follow-up on culture result, low threshold to discontinue antibiotics Palliative care consult   I talked to daughter Jack Rivas over the phone at length:  she told me she is a Quarry manager, Office manager. she is going to try to get his dad better by getting him home under her care. she said the last time his father was able to walk independently was 2-3 yrs ago, Her dad  was on the ventilator about 5 to 9-month back in 2020, had multiple aspiration pneumonia in the past, Jack Rivas report is that used to weigh 240 pounds 2-3 yrs ago, his weight is 187 this time.   she thinks his dad has had dementia at least for several years, this has progressed, his dad did not seem to remember his own birthday recently when they celebrated his birthday on [birth year].  Her dad has been total care/bed to wheelchair bound for a while , I made her aware that if her dad were in a situation that needs CPR done on him, even if it works , it likely will put her dad in a worse situation than he is already in , she said she understands this, she will think about it. she will come to the hospital tomorrow, hopefully we can talk to her in person, I do not think discharge tomorrow is realistic tomorrow, the earliest would be Saturday. Jack Rivas is determined  to get his dad home this time, so I think we need talk to her about arranging/delivering DMEs, and get those delivered /set up tomorrow to prepare discharge on Saturday if medically stable .     I have Reviewed nursing notes, Vitals, pain scores, I/o's, Lab results and  imaging results since pt's last encounter, details please see discussion above  I ordered the following labs:  Unresulted Labs (From admission, onward)     Start     Ordered   03/03/22 0500  CBC with Differential/Platelet  Tomorrow morning,   R  03/02/22 0839   03/03/22 0500  Basic metabolic panel  Tomorrow morning,   R        03/02/22 0839   03/03/22 0500  Magnesium  Tomorrow morning,   R        03/02/22 0839   03/02/22 0500  Procalcitonin  Daily at 5am,   R      03/01/22 1728   03/01/22 1753  Urine Culture  (Urine Culture)  Add-on,   AD       Question:  Indication  Answer:  Sepsis   03/01/22 1752   03/01/22 1726  Expectorated Sputum Assessment w Gram Stain, Rflx to Resp Cult  (COPD / Pneumonia / Cellulitis / Lower Extremity Wound)  Once,   R        03/01/22 1728              DVT prophylaxis: enoxaparin (LOVENOX) injection 40 mg Start: 03/01/22 2200   Code Status:   Code Status: Full Code  Family Communication: Daughter over the phone Disposition:    Dispo: The patient is from: snf              Anticipated d/c is to: home with home hospice, needs continue goals of care discussion,               Anticipated d/c date is: >24hrs, possible on Saturday once all equipment delivered   Antimicrobials:   Anti-infectives (From admission, onward)    Start     Dose/Rate Route Frequency Ordered Stop   03/02/22 0600  azithromycin (ZITHROMAX) 500 mg in sodium chloride 0.9 % 250 mL IVPB        500 mg 250 mL/hr over 60 Minutes Intravenous Every 24 hours 03/01/22 1728 03/06/22 0559   03/01/22 1745  Ampicillin-Sulbactam (UNASYN) 3 g in sodium chloride 0.9 % 100 mL IVPB        3 g 200 mL/hr over 30 Minutes Intravenous Every 8 hours 03/01/22 1737             Objective: Vitals:   03/02/22 1600 03/02/22 1606 03/02/22 1700 03/02/22 1811  BP: 115/85  133/69 (!) 150/98  Pulse: (!) 42  (!) 43 (!) 51  Resp: 10  (!) 9 11  Temp:  97.6 F (36.4 C)    TempSrc:  Oral    SpO2: 99%  100%   Weight:      Height:        Intake/Output Summary (Last 24 hours) at 03/02/2022 1901 Last data filed at 03/01/2022 2058 Gross per 24 hour  Intake 158.74 ml  Output --  Net 158.74 ml   Filed Weights   03/01/22 1316  Weight: 85 kg    Examination:  General exam: alert, awake, does not communicate, upper air ways sounds Respiratory system: lung sound appear to be upper air way. Respiratory effort normal. Cardiovascular system:  bradycardia Gastrointestinal system: did not allow me to exam . Central nervous system: Alert , demented  Extremities:  no edema Skin: No rashes, lesions or ulcers Psychiatry: Currently no agitation.     Data Reviewed: I have personally reviewed  labs and visualized  imaging studies since the last encounter and formulate the plan         Scheduled Meds:  enoxaparin (LOVENOX) injection  40 mg Subcutaneous Q24H   famotidine  20 mg Per Tube QHS   free water  200 mL Per Tube Q4H   LORazepam  0.5 mg Per Tube Daily   polyethylene  glycol  17 g Per Tube Daily   QUEtiapine  25 mg Per Tube QHS   sodium chloride flush  3 mL Intravenous Q12H   valproic acid  250 mg Per Tube QPM   Continuous Infusions:  ampicillin-sulbactam (UNASYN) IV Stopped (03/02/22 1730)   azithromycin Stopped (03/02/22 3893)   dextrose 5% lactated ringers 50 mL/hr at 03/02/22 1332     LOS: 1 day     Albertine Grates, MD PhD FACP [facility]  Available via Epic secure chat 7am-7pm for nonurgent issues Please page for urgent issues To page the attending provider between 7A-7P or the covering provider during after hours 7P-7A, please log into the web site [email or web address] and access using universal [facility] password for that web site. If you do not have the password, please call the hospital operator.    03/02/2022, 7:01 PM

## 2022-03-02 NOTE — ED Notes (Signed)
Residual prior to free water was 80 ml

## 2022-03-02 NOTE — ED Notes (Signed)
Patient yelling in room saying "ahhhhhhhhhhh", when RN went into pt room to assist and readjust patient in bed including telemetry leads patient attempted 3x to grab onto RN and swing. Pt is being very uncooperative.

## 2022-03-02 NOTE — Consult Note (Signed)
Consultation Note Date: 03/02/2022   Patient Name: Jack Rivas  DOB: 1945/09/05  MRN: [id number]  Age / Sex: 76 y.o., male  PCP: Patient, No Pcp Per Referring Physician: Albertine Grates, MD  Reason for Consultation: Establishing goals of care  HPI/Patient Profile: 76 y.o. male  with past medical history of dementia, HTN, and aspiration PNA with feeding tube admitted on 03/01/2022 with altered mental status from SNF.    Patient is enrolled in hospice and has presented to the ED twice in the past 6 months for PEG tube replacement after he has pulled it out.  He also presented similarly in April when he was hospitalized.  Currently admitted for septic shock likely due to aspiration pneumonia and AKI on CKD 3.  PMT has been consulted to assist with goals of care conversation.  Clinical Assessment and Goals of Care:  I have reviewed medical records including EPIC notes, labs and imaging, assessed the patient and then had a phone conversation with patient's daughter Jack Rivas to discuss diagnosis prognosis, GOC, EOL wishes, disposition and options.  I introduced Palliative Medicine as specialized medical care for people living with serious illness. It focuses on providing relief from the symptoms and stress of a serious illness. The goal is to improve quality of life for both the patient and the family.  We discussed a brief life review of the patient and then focused on their current illness.  The natural disease trajectory and expectations at EOL were discussed.  I attempted to elicit values and goals of care important to the patient.    Medical History Review and Understanding:  Had a detailed conversation with patient's daughter about the severity of his illness, providing education on end-stage dementia and complications such as recurrent aspiration pneumonias and hospitalizations.  She tells me there has never been a  discussion with his providers about his prognosis or realistic expectations on his ability to recover functioning.  She understands the patient has a acute kidney injury due to the nursing home not feeding him but states she was told there is no pneumonia.  Clarified findings of septic shock that is likely due to another episode of aspiration pneumonia.  Social History: Patient has resided at [facility] health care SNF.  He will be discharging to home with his daughters, grandchildren, and other family available to support.  Functional and Nutritional State: Patient's daughter confirms he is nonambulatory.  She initially tells me that she is hopeful he will be able to walk again after receiving PT at home, as SNF has not been providing this as per his care plan.  After our discussion and education as above, she states "he may not walk again but at least he will be with family."  She states that SNF has not been feeding him anything by mouth and he has a PEG tube in place.  Palliative Symptoms: Excessive secretions, cough  Advance Directives: A detailed discussion regarding advanced directives was had.    Code Status: Concepts specific to code status, artifical feeding and hydration, and rehospitalization were considered and discussed. Recommended consideration of DNR status, understanding evidenced-based poor outcomes in similar hospitalized patients, as the cause of the arrest is likely associated with chronic/terminal disease rather than a reversible acute cardio-pulmonary event.    Discussion: After a detailed review of patient's medical history with focus on his dementia, patient's daughter seems to have a better understanding of the mechanism of silent aspirations and progressive weakness that is typical of patients with advanced dementia.  She feels he has received good care so far during this admission and confirms that she has discussed with hospice liaison about discharging to home with  hospice rather than back to SNF.  She is very upset about the quality of care he has received since he was discharged to SNF and tells me he had pneumonia last week as well.  With her permission, I shared that patients in his condition typically have a prognosis of weeks to months and that it is worrisome for closer to weeks for Jack Rivas, given his increasing frequency of infections.  She tells me that she feels the extra attention and assistance that family will provide will help slow this cycle of repeating infections, but at the same time she is not expecting that going home will stop these repeated infections either.  She also states "they never know with these things, it could be days or could be 10 to 20 years."  Emotional support and therapeutic listening was provided as she shared her experience and patient's strength during his recent severe illness last year, at which time he had a trach and was dependent on the ventilator for 3 months.  She would want the patient to receive all available aggressive interventions again, including CPR and the ventilator, were he to need them.  Encouraged her to consider the patient's wishes and care preferences as she makes decisions on his behalf, exploring whether he would desire a peaceful and natural death or repeated resuscitation attempts.  I emphasized that it is inevitable that his body will become so weak that this likely will not succeed and confirms her wish to continue with full code.   The difference between aggressive medical intervention and comfort care was considered in light of the patient's goals of care. Hospice services outpatient were explained and offered.   Discussed the importance of continued conversation with family and the medical providers regarding overall plan of care and treatment options, ensuring decisions are within the context of the patient's values and GOCs.   Questions and concerns were addressed. The family was encouraged to call  with questions or concerns.  PMT will continue to support holistically.   SUMMARY OF RECOMMENDATIONS   -Continue full code -Continue current care for now, goal is to discharge home with hospice tomorrow after DME is delivered -Psychosocial emotional support provided -PMT remains available as needed   Prognosis:  Weeks to months  Discharge Planning: Home with Hospice      Primary Diagnoses: Present on Admission:  Septic shock due to undetermined organism (HCC)  Aspiration pneumonia (HCC)  Dementia (HCC)  Bradycardia  Chronic kidney disease, stage 3a (HCC)  Acute kidney injury superimposed on chronic kidney disease (HCC)   Physical Exam Vitals and nursing note reviewed.  Constitutional:      General: He is not in acute distress.    Appearance: He is ill-appearing.  Cardiovascular:     Rate and Rhythm: Bradycardia present.  Pulmonary:     Effort: Pulmonary effort is normal. No respiratory distress.  Neurological:     Mental Status: He is alert. Mental status is at baseline.     Vital Signs: BP 116/66   Pulse (!) 43   Temp 97.7 F (36.5 C) (Oral)   Resp 11   Ht 6\' 3"  (1.905 m)   Wt 85 kg   SpO2 100%   BMI 23.42 kg/m  Pain Scale: PAINAD   Pain Score: 0-No pain   SpO2: SpO2: 100 % O2 Device:SpO2: 100 %  O2 Flow Rate: .    Palliative Assessment/Data: 30% (on tube feeds)     MDM: High   Siera Beyersdorf Johnnette Litter, PA-C  Palliative Medicine Team Team phone # [phone]  Thank you for allowing the Palliative Medicine Team to assist in the care of this patient. Please utilize secure chat with additional questions, if there is no response within 30 minutes please call the above phone number.  Palliative Medicine Team providers are available by phone from 7am to 7pm daily and can be reached through the team cell phone.  Should this patient require assistance outside of these hours, please call the patient's attending physician.

## 2022-03-02 NOTE — ED Notes (Signed)
Patient attempting to get out of bed. RN and NT informed patient of needing to stay in bed and use call button for assistance. Pt readjusted onto bed and bear-hugger placed back on patient

## 2022-03-02 NOTE — ED Notes (Signed)
When this tech entered patient room, patient had removed bear hugger. Bear hugger placed back on patient.

## 2022-03-02 NOTE — Progress Notes (Signed)
[employer] [facility]) Hospitalized Hospice RN note.  [name] is a current hospice patient with [facility]. 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 He was sent to the ED with AMS, hypotension, and bradycardia. He is being admitted for further work up and remains a full code. This is a related admission per [facility] MD.   Seen Jack Rivas at bedside along with daughter [name]. She expresses that their wishes are now to take the patient home once medically stable. The address in the chart is not correct: Home address that he will discharge to: [address], [place], [place] [address]. Daughter Anna Genre number is [phone] and also will be a caregiver.  Patient was sitting up in bed, alert and oriented to self, pleasantly confused. [name] was feeding him his meals due to him aspirating at times because she states he goes to fast. He was asking [name] for more food. Some of his conversations were off topic but [name] states he is nearly back to baseline.   This patient is appropriate for GIP level of care due to IVF and IV antibiotics in the treatment of sepsis due to aspiration pneumonia.   VS:116/66, 43HR, 11RR, 100% O2 I&O: ? Labs:  Latest Reference Range & Units 03/02/22 03:46  BASIC METABOLIC PANEL  Rpt !  Sodium 135 - 145 mmol/L 140  Potassium 3.5 - 5.1 mmol/L 4.8  Chloride 98 - 111 mmol/L 106  CO2 22 - 32 mmol/L 27  Glucose 70 - 99 mg/dL 58 (L)  BUN 8 - 23 mg/dL 24 (H)  Creatinine 3.15 - 1.24 mg/dL 1.76 (H)  Calcium 8.9 - 10.3 mg/dL 9.3  Anion gap 5 - 15  7  GFR, Estimated >60 mL/min 51 (L)  Procalcitonin ng/mL <0.10  WBC 4.0 - 10.5 K/uL 5.2  RBC 4.22 - 5.81 MIL/uL 3.58 (L)  Hemoglobin 13.0 - 17.0 g/dL 16.0 (L)  HCT 73.7 - 10.6 % 33.2 (L)  MCV 80.0 - 100.0 fL 92.7  MCH 26.0 - 34.0 pg 29.1  MCHC 30.0 - 36.0 g/dL 26.9  RDW 48.5 - 46.2 % 13.8  Platelets 150 - 400 K/uL 159  nRBC 0.0 - 0.2 % 0.0  !: Data is  abnormal (L): Data is abnormally low (H): Data is abnormally high  IV/PRN:  Ampicillin-Sulbactam (UNASYN) 3 g in sodium chloride 0.9 % 100 mL IVPB Dose: 3 g Freq: Every 8 hours Route: IV azithromycin (ZITHROMAX) 500 mg in sodium chloride 0.9 % 250 mL IVPB Dose: 500 mg Freq: Every 24 hours Route: IV hydrALAZINE (APRESOLINE) injection 5 mg- Not Given/PRN Dose: 5 mg Freq: Every 4 hours PRN Route: IV PRN Reason: high blood pressure  Diagnostics: FINDINGS: The heart size and mediastinal contours are within normal limits. Unchanged unchanged calcification overlying the left mid lung, correlate with soft tissue calcification on prior chest CT. Both lungs are clear. The visualized skeletal structures are unremarkable.   IMPRESSION: No active disease.     Electronically Signed   By: Allegra Lai M.D.   On: 03/01/2022 13:30  Problem List: Septic shock, likely due to aspiration PNA -Sepsis indicates life-threatening organ dysfunction with mortality >10%, caused by dysregulation to host response.   -SIRS criteria in this patient includes: Hypothermia, bradycardia  -Patient has evidence of acute organ failure with encephalopathy that is not easily explained by another condition. -While awaiting blood cultures, this appears to be a preseptic condition. -Sepsis protocol initiated -Suspected source is aspiration PNA; despite normal  RR and sats, he has marked L-sided rhonchi on exam with known h/o dysphagia/PEG tube but still taking PO -Blood and urine cultures pending -Will admit to progressive care - unless PCCM thinks he requires ICU level of care -Will admit due to:  AMS that is severe or persistent; core temp <95 thought to be due to infection -Treat with IV Unasyn/Azithromycin for suspected aspiration PNA -Will order lower respiratory tract procalcitonin level.   -PCCM consulted   Bradycardia/hypothermia -It is also a lesser consideration that this is not related to shock  physiology and is a primary cardiogenic bradycardia -Once he is warmed, his HR is expected to improve -Cardiology consultation could be considered -Cardiology was consulted during prior hospitalization and recommended EP consult - who recommended no further management and discontinuation of telemetry   Advanced dementia -Patient resides in a SNF, has a feeding tube for nutritional needs, and is on Seroquel nightly for behavior disturbance -He is enrolled in hospice but remains full code -Palliative care consult for ongoing discussion of GOC may be reasonable, but [facility] is following as an outpatient already so will hold for now -Continue Ativan, Depakote   Dysphagia -Has PEG tube -PEG evaluation requested by ostomy nurse, appears to be mildly dislodged -Previously recommended for nectar thick liquid with dysphagia 1 diet -Will request ST evaluation -NPO for now given high risk of aspiration   AKI on stage 3a CKD -Likely related to sepsis -Will gently hydrate and follow  Discharge planning: Daughters would like pt to transfer home instead of facility at [facility]. [facility] will need to get DME delivered to the home before discharge. GOC: Full code [id number] Family: Daughter [name] and Anna Genre updated at bedside.  Upon discharge please use [facility] for transport.  Thank you, Yolande Jolly, DNP, [facility] [phone]

## 2022-03-02 NOTE — Progress Notes (Signed)
    Referral received for Jack Rivas :goals of care discussion. Chart reviewed and noted [name] is a current hospice patient with [employer] [facility]). Patient assessed and is unable to engage appropriately in discussions. No family present at the bedside during my visit.  I spoke with [facility] liaison, Melissa, who is aware the patient is in the ED. An [facility] representative has already been to the hospital and discussed GOC as they have an established relationship with [name] and family. Per Efraim Kaufmann, goal is for return home with hospice at this time and [facility] will coordinate with TOC to facilitate this. PMT will monitor peripherally for now and remains available should urgent needs arise.   Richardson Dopp, [facility] Palliative Medicine Team  Team Phone # [phone]   NO CHARGE

## 2022-03-02 NOTE — Consult Note (Signed)
WOC Nurse Consult Note: Reason for Consult:Consulted for feeding tube concerns, to check placement.  Called bedside RN, Boneta Lucks for clarification and she was not aware that the consult was for that.  WOC team not needed at this time.   Wound type:None Pressure Injury POA: Not assessed. Skin assessment will be completed per protocol by 2 clinicians.  Will not follow at this time.  Please re-consult if needed.  Maple Hudson MSN, RN, FNP-BC CWON Wound, Ostomy, Continence Nurse Pager [phone]

## 2022-03-02 NOTE — Care Management (Signed)
As per Dr. Roda Shutters, she has discussed with family that patient, plan is for patient to be discharged home with [facility] Services and DME.  Yolande Jolly is aware and will order DME for discharge according to a secure chat conversation.

## 2022-03-02 NOTE — Evaluation (Signed)
Physical Therapy Evaluation and discharge Patient Details Name: Jack Rivas MRN: [id number] DOB: 06/12/1946 Today's Date: 03/02/2022  History of Present Illness  Braidon Chermak is a 76 y.o. male presenting with ? aspiration PNA, septic shock, and bradycardia.  PMH of dementia, HTN, and aspiration PNA with feeding tube.  He was admitted from SNF.  Clinical Impression  Pt admitted secondary to problem above with deficits below. Pt requiring mod A +2 for bed mobility and total A +2 to attempt transfers. Was unable to stand with assist. Anticipate pt is close to his baseline. Lives at SNF and recommend return at d/c. No further skilled PT needs. Will sign off. If needs change, please re-consult.       Recommendations for follow up therapy are one component of a multi-disciplinary discharge planning process, led by the attending physician.  Recommendations may be updated based on patient status, additional functional criteria and insurance authorization.  Follow Up Recommendations Long-term institutional care without follow-up therapy Can patient physically be transported by private vehicle: No    Assistance Recommended at Discharge Frequent or constant Supervision/Assistance  Patient can return home with the following  Two people to help with walking and/or transfers;Two people to help with bathing/dressing/bathroom;Assistance with cooking/housework;Direct supervision/assist for medications management;Direct supervision/assist for financial management;Assist for transportation;Help with stairs or ramp for entrance    Equipment Recommendations None recommended by PT  Recommendations for Other Services       Functional Status Assessment Patient has had a recent decline in their functional status and demonstrates the ability to make significant improvements in function in a reasonable and predictable amount of time.     Precautions / Restrictions Precautions Precautions: Fall Restrictions Weight  Bearing Restrictions: No      Mobility  Bed Mobility Overal bed mobility: Needs Assistance Bed Mobility: Supine to Sit, Sit to Supine     Supine to sit: Mod assist, +2 for safety/equipment Sit to supine: Mod assist, +2 for safety/equipment   General bed mobility comments: Pt needed assist with lifting trunk up to sitting as well as slight assist with advancement of the LEs on and off of the bed.    Transfers Overall transfer level: Needs assistance Equipment used: 2 person hand held assist Transfers: Sit to/from Stand Sit to Stand: +2 safety/equipment, Total assist           General transfer comment: Pt only able to complete partial sit to squat at this time.  Decreased active knee extension or hip extension.    Ambulation/Gait                  Stairs            Wheelchair Mobility    Modified Rankin (Stroke Patients Only)       Balance Overall balance assessment: Needs assistance Sitting-balance support: Feet supported Sitting balance-Leahy Scale: Fair       Standing balance-Leahy Scale: Zero Standing balance comment: Pt unable to stand                             Pertinent Vitals/Pain Pain Assessment Pain Assessment: Faces Faces Pain Scale: Hurts a little bit Pain Location: right shoulder with AAROM Pain Descriptors / Indicators: Grimacing Pain Intervention(s): Limited activity within patient's tolerance, Monitored during session, Repositioned    Home Living Family/patient expects to be discharged to:: Skilled nursing facility  Prior Function Prior Level of Function : Needs assist  Cognitive Assist : ADLs (cognitive)     Physical Assist : Mobility (physical);ADLs (physical) Mobility (physical): Bed mobility;Transfers   Mobility Comments: Assume pt required assist for all mobility tasks ADLs Comments: No family present for PLOF and pt unreliable.  Per older notes, pt was at SNF needing  total assist at baseline.     Hand Dominance   Dominant Hand:  (unable to determine)    Extremity/Trunk Assessment   Upper Extremity Assessment Upper Extremity Assessment: Defer to OT evaluation RUE Deficits / Details: shoulder flexion AROM 0-70 degrees, AAROM 0-110 degrees.  All other joints AROM WFLS noted with attempted use during grooming and dressing tasks. RUE Sensation: WNL RUE Coordination: decreased gross motor    Lower Extremity Assessment Lower Extremity Assessment: Generalized weakness    Cervical / Trunk Assessment Cervical / Trunk Assessment: Kyphotic  Communication   Communication: Expressive difficulties  Cognition Arousal/Alertness: Awake/alert Behavior During Therapy: WFL for tasks assessed/performed Overall Cognitive Status: History of cognitive impairments - at baseline                                          General Comments      Exercises     Assessment/Plan    PT Assessment Patient does not need any further PT services;All further PT needs can be met in the next venue of care  PT Problem List Decreased strength;Decreased balance;Decreased activity tolerance;Decreased coordination;Decreased mobility;Decreased cognition;Decreased knowledge of use of DME;Decreased knowledge of precautions;Decreased safety awareness       PT Treatment Interventions      PT Goals (Current goals can be found in the Care Plan section)  Acute Rehab PT Goals PT Goal Formulation: Patient unable to participate in goal setting Time For Goal Achievement: 03/02/22 Potential to Achieve Goals: Fair    Frequency       Co-evaluation PT/OT/SLP Co-Evaluation/Treatment: Yes Reason for Co-Treatment: For patient/therapist safety;Complexity of the patient's impairments (multi-system involvement) PT goals addressed during session: Mobility/safety with mobility;Balance OT goals addressed during session: ADL's and self-care       AM-PAC PT "6 Clicks"  Mobility  Outcome Measure Help needed turning from your back to your side while in a flat bed without using bedrails?: A Lot Help needed moving from lying on your back to sitting on the side of a flat bed without using bedrails?: Total Help needed moving to and from a bed to a chair (including a wheelchair)?: Total Help needed standing up from a chair using your arms (e.g., wheelchair or bedside chair)?: Total Help needed to walk in hospital room?: Total Help needed climbing 3-5 steps with a railing? : Total 6 Click Score: 7    End of Session   Activity Tolerance: Patient tolerated treatment well Patient left: in bed;with call bell/phone within reach (on stretcher in ED) Nurse Communication: Mobility status PT Visit Diagnosis: Unsteadiness on feet (R26.81);Muscle weakness (generalized) (M62.81)    Time: 1638-4536 PT Time Calculation (min) (ACUTE ONLY): 25 min   Charges:   PT Evaluation $PT Eval Moderate Complexity: 1 Mod          Reuel Derby, PT, DPT  Acute Rehabilitation Services  Office: [phone]   Rudean Hitt 03/02/2022, 12:46 PM

## 2022-03-02 NOTE — ED Notes (Signed)
RN attempted to take a temperature, pt uncooperative pushing RNs hand away and yelling "stop it". RN managed to take an axillary temp.

## 2022-03-02 NOTE — Progress Notes (Signed)
Patient admitted to room [phone] from ED, initially he was alert with confusion, exhibiting harmful behavior as he was violently kicking legs and arms at staff, no success with redirection, Dr. Roda Shutters informed and new interventions put into place. Patient is in bed resting at this time. Bed in lowest position, bed alarm in place.

## 2022-03-02 NOTE — ED Notes (Signed)
MD Imogene Burn notified of patient blood glucose, new orders initiated.

## 2022-03-02 NOTE — Progress Notes (Signed)
Transition of Care [facility]) - Emergency Department Mini Assessment   Patient Details  Name: Jack Rivas MRN: [id number] Date of Birth: Jun 13, 1946  [facility]) CM/SW Contact:    Jack Cohn, RN Phone Number: 03/02/2022, 1:35 PM   Clinical Narrative: RNCM received call from [facility] with [facility] regarding disposition plans. Jack Rivas has been in contact with the family and family is agreeable for dad to discharge to family home ([address] [place] [address]) with home hospice care.  Jack Rivas states she is in process of delivering DME to family home and anticipates that all DME will be there by tomorrow (8/18).    RNCM spoke to daughter, Jack Rivas [phone]) who confirms plan.  RNCM discussed plan with attending Roda Shutters).  Dr Roda Shutters anticipates discharge tomorrow after confirming DME delivered and Goals of Care plan completed.  TOC will continue to follow for additional needs.   ED Mini Assessment: What brought you to the Emergency Department? : (P) failure to thrive  Barriers to Discharge: (P) Equipment Delay             Patient Contact and Communications     Spoke with: (P) Jack Rivas Contact Date: (P) 03/02/22,   Contact time: (P) 1300 Contact Phone Number: Demetrius Charity) [phone] Call outcome: (P) discussed disposition plan for dad  Patient states their goals for this hospitalization and ongoing recovery are:: (P) "I don't know"      Admission diagnosis:  Septic shock due to undetermined organism (HCC) [A41.9, R65.21] Patient Active Problem List   Diagnosis Date Noted   Septic shock due to undetermined organism (HCC) 03/01/2022   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   Debility 10/25/2021   Hypothermia 10/25/2021   Hypoglycemia 10/25/2021   Acute kidney injury superimposed on chronic kidney disease (HCC) 10/25/2021   Anemia of chronic disease 10/23/2021   Altered mental status 05/10/2021   HCAP (healthcare-associated pneumonia) 04/20/2021   Chronic kidney  disease, stage 3a (HCC) 04/20/2021   Bradycardia 04/20/2021   Severe sepsis (HCC) 04/20/2021   Malnutrition of moderate degree 03/21/2021   Aspiration pneumonia (HCC) 03/20/2021   Dementia (HCC) 03/20/2021   Dysphagia 03/20/2021   PCP:  Patient, No Pcp Per Pharmacy:   [id number] - [place], [place] - [address] AT [facility] [address] [place] [address] Phone: [phone] Fax: [phone]

## 2022-03-02 NOTE — Progress Notes (Signed)
BSE completed, full report to follow. Pt familiar to this SLP from prior admit in April 2023 when he aspirated [employer].  MBS completed at that time showing laryngeal penetration due to delayed swallow and retention.  Recommend was dys1/nectar and allow tsps of thin water and single ice chips.  Medicine with puree - whole.  At this time, recommendation continues appropriate after this BSE.    Rolena Infante, MS [facility] SLP Acute Rehab Services Office [phone] Pager [phone]

## 2022-03-02 NOTE — ED Notes (Signed)
Speech at bedside

## 2022-03-02 NOTE — Evaluation (Signed)
Occupational Therapy Evaluation Patient Details Name: Jack Rivas MRN: [id number] DOB: 1946/05/16 Today's Date: 03/02/2022   History of Present Illness Jack Rivas is a 76 y.o. male presenting with ? aspiration PNA, septic shock, and bradycardia.  PMH of dementia, HTN, and aspiration PNA with feeding tube.  He was admitted from SNF.   Clinical Impression   Pt currently at overall min assist for grooming tasks overall with mod +2 for supine to sit and sit to supine on the stretcher.  He was unable to complete a stand and only completed sit to squat transition secondary to decreased knee and hip extension.  Feel he is likely close to his functional baseline based on previous hospital admissions and will need total assist for selfcare tasks supine to sit and use of a lift for OOB.  No acute OT needs at this time or follow-up recommended.         Recommendations for follow up therapy are one component of a multi-disciplinary discharge planning process, led by the attending physician.  Recommendations may be updated based on patient status, additional functional criteria and insurance authorization.   Follow Up Recommendations  No OT follow up    Assistance Recommended at Discharge Frequent or constant Supervision/Assistance  Patient can return home with the following Two people to help with walking and/or transfers;Two people to help with bathing/dressing/bathroom;Direct supervision/assist for medications management;Assistance with feeding;Assist for transportation;Assistance with cooking/housework;Direct supervision/assist for financial management    Functional Status Assessment  Patient has not had a recent decline in their functional status  Equipment Recommendations  None recommended by OT       Precautions / Restrictions Precautions Precautions: Fall Restrictions Weight Bearing Restrictions: No      Mobility Bed Mobility Overal bed mobility: Needs Assistance Bed Mobility: Supine  to Sit, Sit to Supine     Supine to sit: Mod assist, +2 for safety/equipment Sit to supine: Mod assist, +2 for safety/equipment   General bed mobility comments: Pt needed assist with lifting trunk up to sitting as well as slight assist with advancement of the LEs on and off of the bed.    Transfers Overall transfer level: Needs assistance   Transfers: Sit to/from Stand Sit to Stand: +2 safety/equipment, Total assist           General transfer comment: Pt only able to complete partial sit to squat at this time.  Decreased active knee extension or hip extension.      Balance Overall balance assessment: Needs assistance Sitting-balance support: Feet supported Sitting balance-Leahy Scale: Fair       Standing balance-Leahy Scale: Zero Standing balance comment: Pt unable to stand                           ADL either performed or assessed with clinical judgement   ADL Overall ADL's : Needs assistance/impaired Eating/Feeding: Supervision/ safety;Sitting Eating/Feeding Details (indicate cue type and reason): simulated Grooming: Wash/dry face;Oral care;Supervision/safety;Sitting               Lower Body Dressing: Maximal assistance Lower Body Dressing Details (indicate cue type and reason): for gripper socks               General ADL Comments: Attempted sit to stand from the edge of the stretcher with total +2, pt only able to achieve slight partial standing during attempt and then sat down abruptly.  Feel he is likely close to functional baseline for the past  year based on cognition and limited mobility.  Will sign off for acute OT services, with recommendation for completion of selfcare tasks at bed level rolling currently.     Vision Baseline Vision/History: 0 No visual deficits (unable to determine) Ability to See in Adequate Light: 0 Adequate Patient Visual Report: No change from baseline Vision Assessment?: No apparent visual deficits      Perception Perception Perception: Within Functional Limits   Praxis Praxis Praxis: Intact    Pertinent Vitals/Pain Pain Assessment Pain Assessment: Faces Faces Pain Scale: Hurts a little bit Pain Location: right shoulder with AAROM Pain Descriptors / Indicators: Grimacing Pain Intervention(s): Limited activity within patient's tolerance     Hand Dominance  (unable to determine)   Extremity/Trunk Assessment Upper Extremity Assessment Upper Extremity Assessment: RUE deficits/detail RUE Deficits / Details: shoulder flexion AROM 0-70 degrees, AAROM 0-110 degrees.  All other joints AROM WFLS noted with attempted use during grooming and dressing tasks. RUE Sensation: WNL RUE Coordination: decreased gross motor   Lower Extremity Assessment Lower Extremity Assessment: Defer to PT evaluation   Cervical / Trunk Assessment Cervical / Trunk Assessment: Kyphotic   Communication Communication Communication: Expressive difficulties   Cognition Arousal/Alertness: Awake/alert Behavior During Therapy: WFL for tasks assessed/performed Overall Cognitive Status: History of cognitive impairments - at baseline                                                  Home Living Family/patient expects to be discharged to:: Skilled nursing facility                                        Prior Functioning/Environment Prior Level of Function : Needs assist  Cognitive Assist : ADLs (cognitive)     Physical Assist : Mobility (physical);ADLs (physical) Mobility (physical): Bed mobility;Transfers     ADLs Comments: No family present for PLOF and pt unreliable.  Per older notes, pt was at SNF needing total assist at baseline.                        Co-evaluation PT/OT/SLP Co-Evaluation/Treatment: Yes Reason for Co-Treatment: Complexity of the patient's impairments (multi-system involvement);For patient/therapist safety   OT goals addressed during  session: ADL's and self-care      AM-PAC OT "6 Clicks" Daily Activity     Outcome Measure Help from another person eating meals?: A Little Help from another person taking care of personal grooming?: A Little Help from another person toileting, which includes using toliet, bedpan, or urinal?: Total Help from another person bathing (including washing, rinsing, drying)?: Total Help from another person to put on and taking off regular upper body clothing?: A Lot Help from another person to put on and taking off regular lower body clothing?: Total 6 Click Score: 11   End of Session Nurse Communication: Mobility status  Activity Tolerance: Patient tolerated treatment well Patient left: in bed;with call bell/phone within reach                   Time: 1002-1027 OT Time Calculation (min): 25 min Charges:  OT General Charges $OT Visit: 1 Visit OT Evaluation $OT Eval Moderate Complexity: 1 Mod  Antonio Woodhams OTR/L 03/02/2022, 12:22 PM

## 2022-03-02 NOTE — ED Notes (Addendum)
Entered pt room around 0730 this am to introduce myself, pt was sitting up on the edge of the bed saturated in urine with 2 bear huggers on and a blanket.  Pt cleaned up and placed clean sheets and and pads will reassess temp and take off bear hugger

## 2022-03-02 NOTE — ED Notes (Signed)
OT and PT at bedside.  Wound nurse called back to inform they do not work on feeding tubes will let dr Ophelia Charter ordering MD know

## 2022-03-02 NOTE — IPAL (Addendum)
I talked to daughter Jack Rivas over the phone at length:  she told me she is a Lawyer, Programmer, multimedia. she is going to try to get his dad better by getting him home under her care. she said the last time his father was able to walk independently was 2-3 yrs ago, Her dad was on the ventilator about 5 to 53-month back in 2020, had multiple aspiration pneumonia in the past. Jack Rivas reports her dad used to weigh 240 pounds 2-3 yrs ago, his weight is 187 lbs this time.   she thinks her dad has had dementia at least for several years, this has progressed, his dad did not seem to remember his own birthday recently when they celebrated his birthday on [birth year].  Her dad has been total care/bed to wheelchair bound for a while , I made her aware that if her dad were in a situation that needs CPR done on him, even if it works , it likely will put her dad in a worse situation than he is already in , she said she understands this, she will think about it. she will come to the hospital tomorrow, hopefully we can talk to her in person, I do not think discharge tomorrow is realistic tomorrow, the earliest would be Saturday. Jack Rivas is determined  to get his dad home this time, so I think we need talk to her about arranging/delivering DMEs, and get those delivered /set up tomorrow to prepare discharge on Saturday if medically stable .

## 2022-03-03 ENCOUNTER — Inpatient Hospital Stay (HOSPITAL_COMMUNITY)

## 2022-03-03 DIAGNOSIS — R001 Bradycardia, unspecified: Secondary | ICD-10-CM

## 2022-03-03 DIAGNOSIS — R6521 Severe sepsis with septic shock: Secondary | ICD-10-CM | POA: Diagnosis not present

## 2022-03-03 DIAGNOSIS — A419 Sepsis, unspecified organism: Secondary | ICD-10-CM | POA: Diagnosis not present

## 2022-03-03 LAB — CULTURE, BLOOD (ROUTINE X 2): Special Requests: ADEQUATE

## 2022-03-03 LAB — BASIC METABOLIC PANEL
Anion gap: 7 (ref 5–15)
BUN: 20 mg/dL (ref 8–23)
CO2: 28 mmol/L (ref 22–32)
Calcium: 9.3 mg/dL (ref 8.9–10.3)
Chloride: 107 mmol/L (ref 98–111)
Creatinine, Ser: 1.32 mg/dL — ABNORMAL HIGH (ref 0.61–1.24)
GFR, Estimated: 56 mL/min — ABNORMAL LOW (ref >60)
Glucose, Bld: 84 mg/dL (ref 70–99)
Potassium: 4.4 mmol/L (ref 3.5–5.1)
Sodium: 142 mmol/L (ref 135–145)

## 2022-03-03 LAB — CBC WITH DIFFERENTIAL/PLATELET
Abs Immature Granulocytes: 0.01 10*3/uL (ref 0.00–0.07)
Basophils Absolute: 0 10*3/uL (ref 0.0–0.1)
Basophils Relative: 1 %
Eosinophils Absolute: 0.8 10*3/uL — ABNORMAL HIGH (ref 0.0–0.5)
Eosinophils Relative: 18 %
HCT: 30.8 % — ABNORMAL LOW (ref 39.0–52.0)
Hemoglobin: 9.7 g/dL — ABNORMAL LOW (ref 13.0–17.0)
Immature Granulocytes: 0 %
Lymphocytes Relative: 43 %
Lymphs Abs: 1.9 10*3/uL (ref 0.7–4.0)
MCH: 29.1 pg (ref 26.0–34.0)
MCHC: 31.5 g/dL (ref 30.0–36.0)
MCV: 92.5 fL (ref 80.0–100.0)
Monocytes Absolute: 0.4 10*3/uL (ref 0.1–1.0)
Monocytes Relative: 9 %
Neutro Abs: 1.3 10*3/uL — ABNORMAL LOW (ref 1.7–7.7)
Neutrophils Relative %: 29 %
Platelets: 135 10*3/uL — ABNORMAL LOW (ref 150–400)
RBC: 3.33 MIL/uL — ABNORMAL LOW (ref 4.22–5.81)
RDW: 13.8 % (ref 11.5–15.5)
WBC: 4.4 10*3/uL (ref 4.0–10.5)
nRBC: 0 % (ref 0.0–0.2)

## 2022-03-03 LAB — URINE CULTURE: Culture: NO GROWTH

## 2022-03-03 LAB — PHOSPHORUS: Phosphorus: 3 mg/dL (ref 2.5–4.6)

## 2022-03-03 LAB — PROCALCITONIN: Procalcitonin: <0.1 ng/mL

## 2022-03-03 LAB — MAGNESIUM
Magnesium: 1.9 mg/dL (ref 1.7–2.4)
Magnesium: 1.8 mg/dL (ref 1.7–2.4)

## 2022-03-03 LAB — GLUCOSE, CAPILLARY
Glucose-Capillary: 100 mg/dL — ABNORMAL HIGH (ref 70–99)
Glucose-Capillary: 73 mg/dL (ref 70–99)
Glucose-Capillary: 87 mg/dL (ref 70–99)

## 2022-03-03 MED ORDER — ZIPRASIDONE MESYLATE 20 MG IM SOLR
10.0000 mg | Freq: Once | INTRAMUSCULAR | Status: AC
  Administered 2022-03-04: 10 mg via INTRAMUSCULAR
  Filled 2022-03-03: qty 20

## 2022-03-03 MED ORDER — JEVITY 1.5 CAL/FIBER PO LIQD
1000.0000 mL | ORAL | Status: DC
  Administered 2022-03-03 – 2022-03-07 (×4): 1000 mL
  Filled 2022-03-03 (×7): qty 1000

## 2022-03-03 MED ORDER — DEXTROSE IN LACTATED RINGERS 5 % IV SOLN: INTRAVENOUS | Status: DC

## 2022-03-03 MED ORDER — ZIPRASIDONE HCL 20 MG PO CAPS
20.0000 mg | ORAL_CAPSULE | ORAL | Status: DC
Start: 2022-03-04 — End: 2022-03-03
  Filled 2022-03-03: qty 1

## 2022-03-03 MED ORDER — ATROPINE SULFATE 1 MG/10ML IJ SOSY: 1.0000 mg | PREFILLED_SYRINGE | INTRAMUSCULAR | Status: DC | PRN

## 2022-03-03 MED ORDER — PROSOURCE TF20 ENFIT COMPATIBL EN LIQD
60.0000 mL | Freq: Every day | ENTERAL | Status: DC
  Administered 2022-03-03 – 2022-03-07 (×5): 60 mL
  Filled 2022-03-03 (×6): qty 60

## 2022-03-03 MED ORDER — MAGNESIUM SULFATE IN D5W 1-5 GM/100ML-% IV SOLN
1.0000 g | Freq: Once | INTRAVENOUS | Status: AC
  Administered 2022-03-03: 1 g via INTRAVENOUS
  Filled 2022-03-03: qty 100
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
## 2022-03-03 NOTE — Progress Notes (Signed)
Initial Nutrition Assessment  DOCUMENTATION CODES:   Non-severe (moderate) malnutrition in context of chronic illness  INTERVENTION:   Tube Feeds via PEG: Start Jevity 1.5 at 25 mL/hr and advance by 10 mL q4h to goal rate of 55 mL/hr (1320 mL/day) 60 mL ProSource TF 20 - daily 170 mL free water flush q4h  Provides 2060 kcal, 104 gm of protein, and 2023 mL total free water daily. Monitor magnesium, potassium, and phosphorus BID for at least 3 days, MD to replete as needed, as pt is at risk for refeeding syndrome given malnutrition.  NUTRITION DIAGNOSIS:   Moderate Malnutrition related to chronic illness as evidenced by mild fat depletion, mild muscle depletion.  GOAL:   Patient will meet greater than or equal to 90% of their needs  MONITOR:   Labs, TF tolerance, Weight trends, I & O's  REASON FOR ASSESSMENT:   Consult Enteral/tube feeding initiation and management, Assessment of nutrition requirement/status  ASSESSMENT:   76 y.o. male presented to the ED with AMS. PMH includes dementia, HTN, dysphagia s/p PEG tube, CKD IIIa, malnutrition. Pt admitted with septic shock, likely due to aspiration PNA.   8/17 - diet advanced to dysphagia 1, nectar thick liquids  Pt in bed with mitts on. Pt unable to provide any nutrition related history to RD. No family present at bedside. Plan to restart tube feedings.   Per EMR, pt weight remains stable.   Medications reviewed and include: Miralax, IV antibiotics, Magnesium sulfate Labs reviewed: Creatinine 1.32    NUTRITION - FOCUSED PHYSICAL EXAM:  Flowsheet Row Most Recent Value  Orbital Region Mild depletion  Upper Arm Region Moderate depletion  Thoracic and Lumbar Region Mild depletion  Buccal Region Mild depletion  Temple Region Moderate depletion  Clavicle Bone Region Mild depletion  Clavicle and Acromion Bone Region Mild depletion  Scapular Bone Region Mild depletion  Dorsal Hand Unable to assess  Patellar Region Severe  depletion  Anterior Thigh Region Severe depletion  Posterior Calf Region Severe depletion  Edema (RD Assessment) None  Hair Reviewed  Eyes Reviewed  Mouth Reviewed  Skin Reviewed  Nails Unable to assess   Diet Order:   Diet Order             DIET - DYS 1 Room service appropriate? Yes; Fluid consistency: Nectar Thick  Diet effective now                   EDUCATION NEEDS:   Not appropriate for education at this time  Skin:  Skin Assessment: Reviewed RN Assessment  Last BM:  Unknown  Height:   Ht Readings from Last 1 Encounters:  03/01/22 6\' 3"  (1.905 m)    Weight:   Wt Readings from Last 1 Encounters:  03/01/22 85 kg    Ideal Body Weight:  89.1 kg  BMI:  Body mass index is 23.42 kg/m.  Estimated Nutritional Needs:   Kcal:  2000-2200  Protein:  100-115 grams  Fluid:  >/= 2 L    03/03/22 RD, LDN Clinical Dietitian See [facility] for contact information.

## 2022-03-03 NOTE — Care Management Important Message (Signed)
Important Message  Patient Details  Name: Jack Rivas MRN: [id number] Date of Birth: 05-01-1946   Medicare Important Message Given:  Yes  Due to illness patient was not able to sign copy of IM was left at the patient bedside.    Graclynn Vanantwerp 03/03/2022, 2:58 PM

## 2022-03-03 NOTE — TOC Progression Note (Signed)
Transition of Care [facility]) - Progression Note    Patient Details  Name: Jack Rivas MRN: [id number] Date of Birth: May 22, 1946  [facility]) CM/SW Contact  Kermit Balo, RN Phone Number: 03/03/2022, 12:50 PM  Clinical Narrative:    Per [facility] hospice, daughter is not accepting the needed DME for home today. She wants the patient to complete treatments here prior to d/c home.  CM has updated the MD.  TOC following.     Barriers to Discharge: Equipment Delay  Expected Discharge Plan and Services                                                 Social Determinants of Health (SDOH) Interventions    Readmission Risk Interventions    03/28/2021    3:34 PM  Readmission Risk Prevention Plan  Transportation Screening Complete  PCP or Specialist Appt within 3-5 Days Complete  HRI or Home Care Consult Complete  Social Work Consult for Recovery Care Planning/Counseling Complete  Palliative Care Screening Not Applicable  Medication Review Oceanographer) Complete

## 2022-03-03 NOTE — Consult Note (Signed)
Cardiology Consultation:   Patient ID: Glynn Yepes MRN: [id number]; DOB: [birth year]  Admit date: 03/01/2022 Date of Consult: 03/03/2022  PCP:  Patient, No Pcp Per   [facility] Providers Cardiologist:  None        Patient Profile:   Froylan Hobby is a 76 y.o. male nonambulatory patient with advanced dementia who we are asked to see for bradycardia  History of Present Illness:   Mr. Heiberger is hospitalized with encephalopathy and acute kidney injury.  He has a PEG tube in place due to chronic aspiration.  Cardiology is consulted for bradycardia.  There is no family at bedside at the time of my evaluation.  The patient is nonverbal and I cannot obtain any history from him.  He arouses to sternal rub.  Palliative care notes are reviewed and there are ongoing discussions about goals of care with the patient's family.   Past Medical History:  Diagnosis Date   Dementia (HCC)    Hypertension    Uses feeding tube     Past Surgical History:  Procedure Laterality Date   CATARACT EXTRACTION, BILATERAL     KNEE SURGERY       Home Medications:  Prior to Admission medications   Medication Sig Start Date End Date Taking? Authorizing Provider  atropine 1 % ophthalmic solution Place 4 drops under the tongue every 4 (four) hours as needed (Excessive secretions).   Yes [provider]  diclofenac Sodium (VOLTAREN) 1 % GEL Apply 2 g topically 4 (four) times daily.   Yes [provider]  famotidine (PEPCID) 20 MG tablet Place 20 mg into feeding tube at bedtime. 03/15/21  Yes [provider]  LORazepam (ATIVAN) 0.5 MG tablet Take 0.5 mg by mouth daily.   Yes [provider]  Nutritional Supplements (FEEDING SUPPLEMENT, JEVITY 1.5 CAL/FIBER,) LIQD Place 1,000 mLs into feeding tube continuous. Patient taking differently: 1,000 mLs by Per J Tube route continuous. In the Morning & Afternoon for Nutrition. Cal @ 70 ml/hr continuous hours X 14 hours/day (Start @ 1800,  Stop @ 0800 via PEG (total 1000 ml formula X 24 hours) 04/26/21  Yes Dahal, Melina Schools, MD  QUEtiapine (SEROQUEL) 25 MG tablet Place 1 tablet (25 mg total) into feeding tube at bedtime. Patient taking differently: Place 25 mg into feeding tube every morning. 05/19/21  Yes Lonia Blood, MD  valproic acid (DEPAKENE) 250 MG/5ML solution Take 250 mg by mouth every evening.   Yes [provider]  Wheat Dextrin (BENEFIBER) POWD Take 1 packet by mouth daily.   Yes [provider]  acetaminophen (TYLENOL) 160 MG/5ML solution Place 10.2 mLs (325 mg total) into feeding tube every 6 (six) hours as needed for mild pain, headache or fever. Patient not taking: Reported on 10/23/2021 05/19/21   Lonia Blood, MD  Water For Irrigation, Sterile (FREE WATER) SOLN Place 200 mLs into feeding tube every 4 (four) hours. 05/19/21   Lonia Blood, MD    Inpatient Medications: Scheduled Meds:  enoxaparin (LOVENOX) injection  40 mg Subcutaneous Q24H   feeding supplement (PROSource TF20)  60 mL Per Tube Daily   free water  200 mL Per Tube Q4H   LORazepam  0.5 mg Per Tube Daily   polyethylene glycol  17 g Per Tube Daily   sodium chloride flush  3 mL Intravenous Q12H   valproic acid  250 mg Per Tube QPM   Continuous Infusions:  ampicillin-sulbactam (UNASYN) IV 3 g (03/03/22 1418)   dextrose 5%  lactated ringers 75 mL/hr at 03/03/22 0900   feeding supplement (JEVITY 1.5 CAL/FIBER)     magnesium sulfate bolus IVPB     PRN Meds: acetaminophen **OR** acetaminophen, albuterol, atropine, atropine, bisacodyl, food thickener, guaiFENesin, hydrALAZINE, LORazepam, ondansetron **OR** ondansetron (ZOFRAN) IV  Allergies:   No Known Allergies  Social History:   Social History   Socioeconomic History   Marital status: Single    Spouse name: Not on file   Number of children: Not on file   Years of education: Not on file   Highest education level: Not on file  Occupational History   Not on file   Tobacco Use   Smoking status: Light Smoker   Smokeless tobacco: Never  Substance and Sexual Activity   Alcohol use: Yes   Drug use: Never   Sexual activity: Not on file  Other Topics Concern   Not on file  Social History Narrative   Not on file   Social Determinants of Health   Financial Resource Strain: Not on file  Food Insecurity: Not on file  Transportation Needs: Not on file  Physical Activity: Not on file  Stress: Not on file  Social Connections: Not on file  Intimate Partner Violence: Not on file    Family History:   No family history on file.  Unable to obtain  ROS:  Please see the history of present illness.  Unable to obtain  Physical Exam/Data:   Vitals:   03/03/22 0331 03/03/22 0436 03/03/22 0800 03/03/22 1357  BP: (!) 158/86 (!) 167/86 133/74 (!) 141/73  Pulse: (!) 44 (!) 48 (!) 45 (!) 44  Resp: 12 14 14 12   Temp: 97.9 F (36.6 C) 98.2 F (36.8 C) 98.1 F (36.7 C)   TempSrc: Axillary Axillary  Oral  SpO2: 100% 100%    Weight:      Height:        Intake/Output Summary (Last 24 hours) at 03/03/2022 1724 Last data filed at 03/03/2022 0924 Gross per 24 hour  Intake 3 ml  Output 1150 ml  Net -1147 ml      03/01/2022    1:16 PM 01/25/2022    8:23 PM 12/30/2021    4:35 PM  Last 3 Weights  Weight (lbs) 187 lb 6.3 oz 187 lb 6.3 oz 187 lb 6.3 oz  Weight (kg) 85 kg 85 kg 85 kg     Body mass index is 23.42 kg/m.  General: Elderly male, poorly responsive, nonverbal, cachectic HEENT: normal Neck: no JVD Vascular: No carotid bruits; Distal pulses 2+ bilaterally Cardiac: Bradycardic and regular no murmur Lungs: Transmitted upper airway sounds, lungs clear Abd: soft, nontender, no hepatomegaly  Ext: no edema Musculoskeletal: Patient in mittens and restraints, no pretibial edema Skin: warm and dry  Neuro: Difficult to assess   EKG:  The EKG was personally reviewed and demonstrates: EKG performed today shows sinus bradycardia 52 bpm, nonspecific ST  abnormality.  Periods of junctional bradycardia. Telemetry:  Telemetry was personally reviewed and demonstrates: Sinus bradycardia without high-grade AV block  Laboratory Data:  High Sensitivity Troponin:  No results for input(s): "TROPONINIHS" in the last 720 hours.   Chemistry Recent Labs  Lab 03/01/22 1358 03/01/22 1408 03/02/22 0346 03/03/22 0408 03/03/22 1615  NA 140 138 140 142  --   K 4.4 4.3 4.8 4.4  --   CL 104  --  106 107  --   CO2 28  --  27 28  --   GLUCOSE 101*  --  58* 84  --   BUN 29*  --  24* 20  --   CREATININE 1.63*  --  1.42* 1.32*  --   CALCIUM 9.1  --  9.3 9.3  --   MG  --   --   --  1.9 1.8  GFRNONAA 43*  --  51* 56*  --   ANIONGAP 8  --  7 7  --     Recent Labs  Lab 03/01/22 1358  PROT 7.0  ALBUMIN 3.2*  AST 16  ALT 16  ALKPHOS 63  BILITOT 0.2*   Lipids No results for input(s): "CHOL", "TRIG", "HDL", "LABVLDL", "LDLCALC", "CHOLHDL" in the last 168 hours.  Hematology Recent Labs  Lab 03/01/22 1358 03/01/22 1408 03/02/22 0346 03/03/22 0408  WBC 4.5  --  5.2 4.4  RBC 3.63*  --  3.58* 3.33*  HGB 10.5* 10.9* 10.4* 9.7*  HCT 33.7* 32.0* 33.2* 30.8*  MCV 92.8  --  92.7 92.5  MCH 28.9  --  29.1 29.1  MCHC 31.2  --  31.3 31.5  RDW 13.7  --  13.8 13.8  PLT 152  --  159 135*   Thyroid  Recent Labs  Lab 03/01/22 1358  TSH 9.132*  FREET4 0.90    BNPNo results for input(s): "BNP", "PROBNP" in the last 168 hours.  DDimer No results for input(s): "DDIMER" in the last 168 hours.   Radiology/Studies:  DG Swallowing Func-Speech Pathology  Result Date: 03/03/2022 Table formatting from the original result was not included. Objective Swallowing Evaluation: Type of Study: MBS-Modified Barium Swallow Study  Patient Details Name: Levaughn Woitas MRN: [id number] Date of Birth: [birth year] Today's Date: 03/03/2022 Time: SLP Start Time (ACUTE ONLY): 67 -SLP Stop Time (ACUTE ONLY): 1306 SLP Time Calculation (min) (ACUTE ONLY): 28 min Past Medical History: Past  Medical History: Diagnosis Date  Dementia ([place])   Hypertension   Uses feeding tube  Past Surgical History: Past Surgical History: Procedure Laterality Date  CATARACT EXTRACTION, BILATERAL    KNEE SURGERY   HPI: Pt is a 76 y/o male who presented with AMS. He was admitted with sepsis and suspected aspiration pneumonia. CXR on admission negative for active disease. PMH: dementia, HTN, dysphagia, aspiration PNA, G-tube, pt currently on hospice. MBS 10/27/21: moderate oropharyngeal dysphagia with Impaired oral control. intermittent oral retention, a pharyngeal delay, penetration (thin and nectar via straw) and aspiration. A dysphagia 1 diet with nectar thick liquids via cup was recommended.  Subjective: pt awake in bed, dysarthric  Recommendations for follow up therapy are one component of a multi-disciplinary discharge planning process, led by the attending physician.  Recommendations may be updated based on patient status, additional functional criteria and insurance authorization. Assessment / Plan / Recommendation   03/03/2022   4:39 PM Clinical Impressions Clinical Impression Despite repositioning, the view of the larynx was partially obscured by the pt's shoulders. Pt presents with oropharyngeal dysphagia characterized by reduced bolus cohesion, a pharyngeal delay, weak lingual manipulation, and reduction in tongue base retraction, hyolaryngeal elevation, anterior laryngeal movement, and epiglottic inversion. He demonstrated lingual residue, vallecular residue, reduced laryngeal closure, and pyriform sinus residue. Penetration (PAS 3,5) and silent aspiration (PAS 8) were noted with thin liquids and with nectar thick liquids via straw. Pt was inconsistently able to demonstrate prompted coughing which was ineffective in expelling aspirated material. No penetration/aspiration were noted with honey thick liquids, but increased pharyngeal residue was noted with this consistency. Despite multiple attempts, pt was unable to  swallow a  37mm barium tablet that was embedded in puree, so it was ultimately removed. Pt's swallow function appears similar to that noted in April. A dysphagia 1 diet with nectar thick liquids is recommended at this time with strict observance of swallowing precautions including avoidance of straws. SLP will continue to follow pt. SLP Visit Diagnosis Dysphagia, oropharyngeal phase (R13.12) Impact on safety and function Moderate aspiration risk;Mild aspiration risk     03/03/2022   4:39 PM Treatment Recommendations Treatment Recommendations Therapy as outlined in treatment plan below     03/03/2022   4:39 PM Prognosis Prognosis for Safe Diet Advancement Guarded Barriers to Reach Goals Time post onset;Behavior;Severity of deficits;Cognitive deficits   03/03/2022   4:39 PM Diet Recommendations SLP Diet Recommendations Dysphagia 1 (Puree) solids;Nectar thick liquid Liquid Administration via Cup;No straw Medication Administration Crushed with puree Compensations Slow rate;Small sips/bites Postural Changes Seated upright at 90 degrees;Remain semi-upright after after feeds/meals (Comment)     03/03/2022   4:39 PM Other Recommendations Other Recommendations Order thickener from pharmacy;Have oral suction available Follow Up Recommendations Follow physician's recommendations for discharge plan and follow up therapies Assistance recommended at discharge Frequent or constant Supervision/Assistance Functional Status Assessment Patient has had a recent decline in their functional status and/or demonstrates limited ability to make significant improvements in function in a reasonable and predictable amount of time   03/03/2022   4:39 PM Frequency and Duration  Speech Therapy Frequency (ACUTE ONLY) min 1 x/week Treatment Duration 1 week     03/03/2022  12:38 PM Oral Phase Oral Phase Impaired Oral - Honey Cup Decreased bolus cohesion;Premature spillage;Weak lingual manipulation;Lingual/palatal residue Oral - Nectar Cup Decreased bolus  cohesion;Premature spillage;Weak lingual manipulation;Lingual/palatal residue Oral - Nectar Straw Decreased bolus cohesion;Premature spillage;Weak lingual manipulation;Lingual/palatal residue Oral - Thin Cup Decreased bolus cohesion;Premature spillage;Weak lingual manipulation;Lingual/palatal residue Oral - Thin Straw Decreased bolus cohesion;Premature spillage;Weak lingual manipulation;Lingual/palatal residue Oral - Puree Decreased bolus cohesion;Premature spillage;Weak lingual manipulation;Lingual/palatal residue Oral - Mech Soft NT Oral - Regular Weak lingual manipulation;Lingual/palatal residue;Impaired mastication    03/03/2022   4:39 PM Pharyngeal Phase Pharyngeal Material enters airway, remains ABOVE vocal cords then ejected out Pharyngeal Material enters airway, passes BELOW cords without attempt by patient to eject out (silent aspiration);Material enters airway, passes BELOW cords and not ejected out despite cough attempt by patient Pharyngeal Material enters airway, passes BELOW cords without attempt by patient to eject out (silent aspiration) Pharyngeal Material enters airway, passes BELOW cords without attempt by patient to eject out (silent aspiration)    03/03/2022  12:38 PM Cervical Esophageal Phase  Cervical Esophageal Phase [facility] Shanika I. Vear Clock, MS, CCC-SLP Acute Rehabilitation Services Office number [phone] Scheryl Marten 03/03/2022, 4:41 PM                     CT HEAD WO CONTRAST  Result Date: 03/01/2022 CLINICAL DATA:  Altered mental status. EXAM: CT HEAD WITHOUT CONTRAST CT CERVICAL SPINE WITHOUT CONTRAST TECHNIQUE: Multidetector CT imaging of the head and cervical spine was performed following the standard protocol without intravenous contrast. Multiplanar CT image reconstructions of the cervical spine were also generated. RADIATION DOSE REDUCTION: This exam was performed according to the departmental dose-optimization program which includes automated exposure control, adjustment  of the mA and/or kV according to patient size and/or use of iterative reconstruction technique. COMPARISON:  May 10, 2021. FINDINGS: CT HEAD FINDINGS Brain: No evidence of acute infarction, hemorrhage, hydrocephalus, extra-axial collection or mass lesion/mass effect. Vascular: No hyperdense vessel or unexpected calcification.  Skull: Normal. Negative for fracture or focal lesion. Sinuses/Orbits: Complete opacification of right maxillary sinus and right sphenoid sinus consistent with sinusitis. Other: None. CT CERVICAL SPINE FINDINGS Alignment: Mild grade 1 anterolisthesis of C7-T1 is noted secondary to posterior facet joint hypertrophy. Skull base and vertebrae: No acute fracture. There is increased left superior articular process of C3 as well as involving the left-sided articular process of C2, most consistent with significantly worsening degenerative change, although focal infection cannot be excluded. Soft tissues and spinal canal: No prevertebral fluid or swelling. No visible canal hematoma. Disc levels: Severe degenerative disc disease is noted at C3-4, C4-5 C6-7 and C7-T1. Status post surgical interbody fusion of C5-6. Upper chest: Negative. Other: None. IMPRESSION: No acute intracranial abnormality is noted. Probable acute right maxillary and sphenoid sinusitis is noted. No fracture is noted in the cervical spine. Multilevel degenerative changes are noted. There is noted increased destruction of the adjacent articular processes of the left-sided C2-3 facet joint, most consistent with worsening degenerative joint disease, although infection cannot be excluded. MRI may be performed for further evaluation. Electronically Signed   By: Marijo Conception M.D.   On: 03/01/2022 15:06   CT Cervical Spine Wo Contrast  Result Date: 03/01/2022 CLINICAL DATA:  Altered mental status. EXAM: CT HEAD WITHOUT CONTRAST CT CERVICAL SPINE WITHOUT CONTRAST TECHNIQUE: Multidetector CT imaging of the head and cervical spine  was performed following the standard protocol without intravenous contrast. Multiplanar CT image reconstructions of the cervical spine were also generated. RADIATION DOSE REDUCTION: This exam was performed according to the departmental dose-optimization program which includes automated exposure control, adjustment of the mA and/or kV according to patient size and/or use of iterative reconstruction technique. COMPARISON:  May 10, 2021. FINDINGS: CT HEAD FINDINGS Brain: No evidence of acute infarction, hemorrhage, hydrocephalus, extra-axial collection or mass lesion/mass effect. Vascular: No hyperdense vessel or unexpected calcification. Skull: Normal. Negative for fracture or focal lesion. Sinuses/Orbits: Complete opacification of right maxillary sinus and right sphenoid sinus consistent with sinusitis. Other: None. CT CERVICAL SPINE FINDINGS Alignment: Mild grade 1 anterolisthesis of C7-T1 is noted secondary to posterior facet joint hypertrophy. Skull base and vertebrae: No acute fracture. There is increased left superior articular process of C3 as well as involving the left-sided articular process of C2, most consistent with significantly worsening degenerative change, although focal infection cannot be excluded. Soft tissues and spinal canal: No prevertebral fluid or swelling. No visible canal hematoma. Disc levels: Severe degenerative disc disease is noted at C3-4, C4-5 C6-7 and C7-T1. Status post surgical interbody fusion of C5-6. Upper chest: Negative. Other: None. IMPRESSION: No acute intracranial abnormality is noted. Probable acute right maxillary and sphenoid sinusitis is noted. No fracture is noted in the cervical spine. Multilevel degenerative changes are noted. There is noted increased destruction of the adjacent articular processes of the left-sided C2-3 facet joint, most consistent with worsening degenerative joint disease, although infection cannot be excluded. MRI may be performed for further  evaluation. Electronically Signed   By: Marijo Conception M.D.   On: 03/01/2022 15:06   DG Chest Portable 1 View  Result Date: 03/01/2022 CLINICAL DATA:  Weakness EXAM: PORTABLE CHEST 1 VIEW COMPARISON:  Chest x-ray dated October 23, 2021; chest CT dated October 24, 2021 FINDINGS: The heart size and mediastinal contours are within normal limits. Unchanged unchanged calcification overlying the left mid lung, correlate with soft tissue calcification on prior chest CT. Both lungs are clear. The visualized skeletal structures are unremarkable. IMPRESSION: No active  disease. Electronically Signed   By: Yetta Glassman M.D.   On: 03/01/2022 13:30     Assessment and Plan:   76 year old male with sinus bradycardia and episodic junctional bradycardia.  The patient has advanced medical illness and dementia.  He is not hemodynamically unstable as he is normotensive.  He has no high-grade AV block.  No indication for pacemaker or other cardiac interventions.  Obviously avoid any AV nodal blockers.  I tried to call his daughter and I left her a Advertising account executive.  No further cardiac recommendations.  Will sign off.   Risk Assessment/Risk Scores:        [facility] will sign off.   Medication Recommendations:  none Other recommendations (labs, testing, etc):  none Follow up as an outpatient:  palliative care  For questions or updates, please contact [facility] Please consult [email or web address] for contact info under    Signed, Sherren Mocha, MD  03/03/2022 5:24 PM

## 2022-03-03 NOTE — Progress Notes (Addendum)
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 He was sent to the ED with AMS, hypotension, and bradycardia from his facility. He is admitted with sepsis, suspected etiology of PNA.This is a related admission per Dr. Jamie Rivas, [facility] MD.   Visited patient at the bedside, he is alert and oriented, does not answer questions but follows writer around the room. He is currently calm, safety mittens in place.  V/S: 98.1 oral, 141/73, HR 44, RR 14, SPO2 10% on RA I&O: no recorded input, output 1150 Labs: Cr. 1.32, GFR 56 Diagnostics: none IV/PRN: unasyn 3 g IV TID, azothromycin 500 mg IV QD, D5LR continuous @ 75 ml/hr IV, mag sulfate 1 g IV x 1  Jack Rivas is inpatient appropriate for IV abx treatment of suspected aspiration PNA.  Problem List: - septic shock - felt to be secondary to aspiration PNA, abx as above. Swallow evaluation per SLP. - altered mental status - today, nearing baseline - hypothermia - resolved - bradycardia - HR noted to be in 30's, pt appears asymptomatic during episodes, cardiology consulted as patient remains a full code and family wishes for full scope of care  GOC: Ongoing. Pt is under hospice services at his LTC facility. Family is not pleased with his deterioration and feels like the facility has not been trying to help him get better.  D/C planning: Ongoing. As of today, the plan is to d/c Monday to his daughter's home and she will provide care for him. DME has been arranged (bed, table, BSC, oral suction, walker, WC) to be delivered Sunday. Family: not present at the bedside, spoke at length over the phone.  IDT: hospice team update. Homecare social worker attempting to support his daughter with getting assistance in becoming his paid caregiver.  Transfer summary and med list to shadow  chart.  At discharge, please use [facility] for transport, they contract this service for our active hospice patients.  Jack Bamberg DNP, RN [facility] Liaison [phone])

## 2022-03-03 NOTE — Progress Notes (Signed)
Modified Barium Swallow Progress Note  Patient Details  Name: Jack Rivas MRN: [id number] Date of Birth: Jul 19, 1945  Today's Date: 03/03/2022  Modified Barium Swallow completed.  Full report located under Chart Review in the Imaging Section.  Brief recommendations include the following:  Clinical Impression  Despite repositioning, the view of the larynx was partially obscured by the pt's shoulders. Pt presents with oropharyngeal dysphagia characterized by reduced bolus cohesion, a pharyngeal delay, weak lingual manipulation, and reduction in tongue base retraction, hyolaryngeal elevation, anterior laryngeal movement, and epiglottic inversion. He demonstrated lingual residue, vallecular residue, reduced laryngeal closure, and pyriform sinus residue. Penetration (PAS 3,5) and silent aspiration (PAS 8) were noted with thin liquids and with nectar thick liquids via straw. Pt was inconsistently able to demonstrate prompted coughing which was ineffective in expelling aspirated material. No penetration/aspiration were noted with honey thick liquids, but increased pharyngeal residue was noted with this consistency. Despite multiple attempts, pt was unable to swallow a 37mm barium tablet that was embedded in puree, so it was ultimately removed. Pt's swallow function appears similar to that noted in April. A dysphagia 1 diet with nectar thick liquids is recommended at this time with strict observance of swallowing precautions including avoidance of straws. SLP will continue to follow pt.   Swallow Evaluation Recommendations       SLP Diet Recommendations: Dysphagia 1 (Puree) solids;Nectar thick liquid   Liquid Administration via: Cup;No straw   Medication Administration: Crushed with puree   Supervision: Staff to assist with self feeding;Full supervision/cueing for compensatory strategies   Compensations: Slow rate;Small sips/bites   Postural Changes: Seated upright at 90 degrees;Remain semi-upright  after after feeds/meals (Comment)       Other Recommendations: Order thickener from pharmacy;Have oral suction available   Dionicio [place]utt I. Vear Clock, MS, CCC-SLP Acute Rehabilitation Services Office number [phone]  Scheryl Marten 03/03/2022,4:40 PM

## 2022-03-03 NOTE — Progress Notes (Signed)
PROGRESS NOTE    Jack Rivas  OQH:[id number] DOB: [birth year] DOA: 03/01/2022 PCP: Patient, No Pcp Per     Brief Narrative:  History of dementia, baseline nonambulatory , total care, normally respond to commands and verbal  at baseline, h/o dysphagia status post PEG tube placement, sent from SNF to ED due to fall last night, altered mental status x1 day, alert to name only ,+ AKI and bradycardia,  cxr no acute findings Per daughter, He is supposed to have a few bites of PO intake with feeding assistance in addition to tube feeds but "they bring him a full tray" and leave it for him at the facility .  He does have some coughing/choking/sputtering with PO intake.  Subjective:   Appear slightly more awake , Less upper airways congested gurgling , I did not hear cough today during encounter as I did yesterday does not follow command, he does not allow me to exam his peg tube Currently no agitation   Persistent bradycardia Blood pressure has improved    Assessment & Plan:  Principal Problem:   Septic shock due to undetermined organism [facility]) Active Problems:   Aspiration pneumonia (HCC)   Dementia (HCC)   Dysphagia   Chronic kidney disease, stage 3a (HCC)   Bradycardia   Altered mental status   Hypothermia   Acute kidney injury superimposed on chronic kidney disease (HCC)    Acute metabolic encephalopathy/bradycardia/hypothermia/dehydration/AKI/hypoglycemia/possible aspiration pneumonia -Resume tube feeds, -Repeat speech eval,/BMS, dysphagia 1 diet, nectar thick liquid, meds crushed with pure, small rate, small sips/bites, sitting upright during and after meal -cr improving, d/c ivf, continue unasyn for now, can transition to augmentin per tube at discharge, plan for  total of 5 days abx treatment, though his risk of aspiration likely will continue  Palliative care consult  sinus bradycardia and episodic junctional bradycardia Avoid AV nodal blockers Cardiology consulted as  daughter wish for aggressive treatment, seen by cardiology, no plan for cardiac interventions    I have Reviewed nursing notes, Vitals, pain scores, I/o's, Lab results and  imaging results since pt's last encounter, details please see discussion above  I ordered the following labs:  Unresulted Labs (From admission, onward)     Start     Ordered   03/04/22 0500  CBC with Differential/Platelet  Tomorrow morning,   R        03/03/22 0807   03/04/22 0500  Basic metabolic panel  Tomorrow morning,   R        03/03/22 0807   03/01/22 1753  Urine Culture  (Urine Culture)  Add-on,   AD       Question:  Indication  Answer:  Sepsis   03/01/22 1752   03/01/22 1726  Expectorated Sputum Assessment w Gram Stain, Rflx to Resp Cult  (COPD / Pneumonia / Cellulitis / Lower Extremity Wound)  Once,   R        03/01/22 1728             DVT prophylaxis: enoxaparin (LOVENOX) injection 40 mg Start: 03/01/22 2200   Code Status:   Code Status: Full Code  Family Communication: Daughter over the phone on 8/17 and 8/18 Disposition:    Dispo: The patient is from: snf              Anticipated d/c is to: home with home hospice, needs continue goals of care discussion,               Anticipated d/c date is:  daughter want patient stay in the hospital over the weekend, she states she will get his dad home on monday once all equipment delivered   Antimicrobials:   Anti-infectives (From admission, onward)    Start     Dose/Rate Route Frequency Ordered Stop   03/02/22 0600  azithromycin (ZITHROMAX) 500 mg in sodium chloride 0.9 % 250 mL IVPB        500 mg 250 mL/hr over 60 Minutes Intravenous Every 24 hours 03/01/22 1728 03/06/22 0559   03/01/22 1745  Ampicillin-Sulbactam (UNASYN) 3 g in sodium chloride 0.9 % 100 mL IVPB        3 g 200 mL/hr over 30 Minutes Intravenous Every 8 hours 03/01/22 1737             Objective: Vitals:   03/02/22 1959 03/02/22 2330 03/03/22 0331 03/03/22 0436  BP: (!)  152/97 (!) 160/86 (!) 158/86 (!) 167/86  Pulse:  (!) 44 (!) 44 (!) 48  Resp:  14 12 14   Temp:  97.6 F (36.4 C) 97.9 F (36.6 C) 98.2 F (36.8 C)  TempSrc:  Axillary Axillary Axillary  SpO2:  100% 100% 100%  Weight:      Height:        Intake/Output Summary (Last 24 hours) at 03/03/2022 03/05/2022 Last data filed at 03/03/2022 0456 Gross per 24 hour  Intake --  Output 1150 ml  Net -1150 ml   Filed Weights   03/01/22 1316  Weight: 85 kg    Examination:  General exam: alert, awake, does not communicate, less gurgling today Respiratory system: no rhonchi,  no wheezing, no rales,  Respiratory effort normal. Cardiovascular system:  bradycardia Gastrointestinal system: did not allow me to exam . Central nervous system: Alert , demented  Extremities:  no edema Skin: No rashes, lesions or ulcers Psychiatry: Currently no agitation.     Data Reviewed: I have personally reviewed  labs and visualized  imaging studies since the last encounter and formulate the plan        Scheduled Meds:  enoxaparin (LOVENOX) injection  40 mg Subcutaneous Q24H   famotidine  20 mg Per Tube QHS   free water  200 mL Per Tube Q4H   LORazepam  0.5 mg Per Tube Daily   polyethylene glycol  17 g Per Tube Daily   QUEtiapine  25 mg Per Tube QHS   sodium chloride flush  3 mL Intravenous Q12H   valproic acid  250 mg Per Tube QPM   Continuous Infusions:  ampicillin-sulbactam (UNASYN) IV 3 g (03/03/22 0456)   azithromycin 500 mg (03/03/22 0552)   dextrose 5% lactated ringers       LOS: 2 days     03/05/22, MD PhD FACP [facility]  Available via Epic secure chat 7am-7pm for nonurgent issues Please page for urgent issues To page the attending provider between 7A-7P or the covering provider during after hours 7P-7A, please log into the web site [email or web address] and access using universal [place] password for that web site. If you do not have the password, please call the hospital  operator.    03/03/2022, 8:12 AM

## 2022-03-04 ENCOUNTER — Encounter (HOSPITAL_COMMUNITY): Payer: Self-pay | Admitting: Internal Medicine

## 2022-03-04 ENCOUNTER — Other Ambulatory Visit: Payer: Self-pay

## 2022-03-04 DIAGNOSIS — R1310 Dysphagia, unspecified: Secondary | ICD-10-CM

## 2022-03-04 DIAGNOSIS — F0150 Vascular dementia without behavioral disturbance: Secondary | ICD-10-CM | POA: Diagnosis not present

## 2022-03-04 DIAGNOSIS — A419 Sepsis, unspecified organism: Secondary | ICD-10-CM | POA: Diagnosis not present

## 2022-03-04 DIAGNOSIS — J690 Pneumonitis due to inhalation of food and vomit: Secondary | ICD-10-CM | POA: Diagnosis not present

## 2022-03-04 LAB — CBC WITH DIFFERENTIAL/PLATELET
Abs Immature Granulocytes: 0 10*3/uL (ref 0.00–0.07)
Basophils Absolute: 0 10*3/uL (ref 0.0–0.1)
Basophils Relative: 1 %
Eosinophils Absolute: 0.7 10*3/uL — ABNORMAL HIGH (ref 0.0–0.5)
Eosinophils Relative: 18 %
HCT: 31.3 % — ABNORMAL LOW (ref 39.0–52.0)
Hemoglobin: 10.3 g/dL — ABNORMAL LOW (ref 13.0–17.0)
Immature Granulocytes: 0 %
Lymphocytes Relative: 32 %
Lymphs Abs: 1.3 10*3/uL (ref 0.7–4.0)
MCH: 29.3 pg (ref 26.0–34.0)
MCHC: 32.9 g/dL (ref 30.0–36.0)
MCV: 88.9 fL (ref 80.0–100.0)
Monocytes Absolute: 0.3 10*3/uL (ref 0.1–1.0)
Monocytes Relative: 8 %
Neutro Abs: 1.8 10*3/uL (ref 1.7–7.7)
Neutrophils Relative %: 41 %
Platelets: 132 10*3/uL — ABNORMAL LOW (ref 150–400)
RBC: 3.52 MIL/uL — ABNORMAL LOW (ref 4.22–5.81)
RDW: 13.5 % (ref 11.5–15.5)
WBC: 4.2 10*3/uL (ref 4.0–10.5)
nRBC: 0 % (ref 0.0–0.2)

## 2022-03-04 LAB — GLUCOSE, CAPILLARY
Glucose-Capillary: 102 mg/dL — ABNORMAL HIGH (ref 70–99)
Glucose-Capillary: 104 mg/dL — ABNORMAL HIGH (ref 70–99)
Glucose-Capillary: 64 mg/dL — ABNORMAL LOW (ref 70–99)
Glucose-Capillary: 72 mg/dL (ref 70–99)
Glucose-Capillary: 85 mg/dL (ref 70–99)
Glucose-Capillary: 88 mg/dL (ref 70–99)
Glucose-Capillary: 92 mg/dL (ref 70–99)
Glucose-Capillary: 93 mg/dL (ref 70–99)

## 2022-03-04 LAB — BASIC METABOLIC PANEL
Anion gap: 5 (ref 5–15)
BUN: 22 mg/dL (ref 8–23)
CO2: 30 mmol/L (ref 22–32)
Calcium: 9.3 mg/dL (ref 8.9–10.3)
Chloride: 107 mmol/L (ref 98–111)
Creatinine, Ser: 1.08 mg/dL (ref 0.61–1.24)
GFR, Estimated: >60 mL/min (ref >60)
Glucose, Bld: 93 mg/dL (ref 70–99)
Potassium: 4.1 mmol/L (ref 3.5–5.1)
Sodium: 142 mmol/L (ref 135–145)

## 2022-03-04 LAB — MAGNESIUM
Magnesium: 1.9 mg/dL (ref 1.7–2.4)
Magnesium: 1.8 mg/dL (ref 1.7–2.4)

## 2022-03-04 LAB — PHOSPHORUS
Phosphorus: 3.3 mg/dL (ref 2.5–4.6)
Phosphorus: 3.6 mg/dL (ref 2.5–4.6)

## 2022-03-04 NOTE — Progress Notes (Addendum)
PROGRESS NOTE        PATIENT DETAILS Name: Jack Rivas Age: 76 y.o. Sex: male Date of Birth: 1946/05/06 Admit Date: 03/01/2022 Admitting Physician Jonah Blue, MD [id number], No Pcp Per  Brief Summary: Patient is a 76 y.o.  male advanced dementia-with bedbound status-severe dysphagia s/p PEG tube placement-who presented from SNF for worsening mental status-was found to have sepsis due to aspiration PNA causing acute metabolic encephalopathy.  See below for further details.   Significant events: 8/16>> admit to [facility]-from SNF-sepsis-aspiration pneumonia-encephalopathy  Significant studies: 8/16>> CXR: No active disease. 8/16>> CT head: No acute intracranial abnormality.  Probable acute right maxillary/sphenoid sinusitis noted. 8/16>> CT C-spine: No fracture-increased destruction of the adjacent articular process of left-sided C2-C3 facet joint-most consistent with worsening degenerative joint disease.   Significant microbiology data: 8/16>> COVID/influenza PCR: Negative 8/16>> blood culture: 1/2-Staphylococcus capitis-likely a contamination. 8/16>> urine culture: No growth  Procedures: None  Consults: Active care/cardiology  Subjective: Comfortable-opens eyes-nonverbal  Objective: Vitals: Blood pressure (!) 153/83, pulse (!) 48, temperature 98.2 F (36.8 C), temperature source Axillary, resp. rate 12, height 6\' 3"  (1.905 m), weight 87.1 kg, SpO2 100 %.   Exam: Gen Exam: Not in any distress-opens eyes but does not follow commands.  Very frail/chronically sick appearing. HEENT:atraumatic, normocephalic Chest: B/L clear to auscultation anteriorly CVS:S1S2 regular Abdomen:soft non tender, non distended Extremities:no edema Neurology: Difficult exam-does not follow commands-seems to be withdrawing to pain at times. Skin: no rash  Pertinent Labs/Radiology:    Latest Ref Rng & Units 03/04/2022    4:25 AM 03/03/2022    4:08 AM 03/02/2022     3:46 AM  CBC  WBC 4.0 - 10.5 K/uL 4.2  4.4  5.2   Hemoglobin 13.0 - 17.0 g/dL 03/04/2022  9.7  29.9   Hematocrit 39.0 - 52.0 % 31.3  30.8  33.2   Platelets 150 - 400 K/uL 132  135  159     Lab Results  Component Value Date   NA 142 03/04/2022   K 4.1 03/04/2022   CL 107 03/04/2022   CO2 30 03/04/2022      Assessment/Plan: Sepsis due to aspiration PNA: Sepsis physiology has improved-he is afebrile-no leukocytosis-given his overall poor health-suspect he is overall improved and close to his usual baseline.  Culture data as above.  Continue Unasyn as planned x5 days total.  Given advanced dementia//dysphagia-he remains at risk for recurrent aspiration episodes and subsequent decompensation.  Maintain aspiration precautions at all times.  Acute metabolic encephalopathy: Due to aspiration PNA-suspect he is now back to baseline.  AKI: Hemodynamically mediated-resolved.  Acute sinusitis: Seen on CT imaging-on appropriate antibiotics.  Coag negative staph bacteremia: No indication for treatment-likely a contamination.  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  Per chart review-[name] family wants to continue full scope of treatment.  Continue supportive care-PEG tube feeds.  Sinus bradycardia/episodic junctional bradycardia: Evaluated by cardiology-no indication for PPM or cardiac interventions-suspect that even if he were to have a hemodynamically unstable event-he will be a poor candidate for aggressive care.  Continue to avoid AV nodal blocking agents.  Cervical degenerative disc disease: CT C-spine findings reviewed-do not think he would be a candidate for any sort of surgery at this  point.  Supportive care for now.  Palliative care: Full code for now-Per review of prior  notes-family wanted to continue full scope of treatment.  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  Will await further recommendations from palliative care team-they are in the process of having ongoing goals of care discussion with the patient's daughter.  Nutrition Status: Nutrition Problem: Moderate Malnutrition Etiology: chronic illness Signs/Symptoms: mild fat depletion, mild muscle depletion Interventions: MVI, Tube feeding  BMI: Estimated body mass index is 24 kg/m as calculated from the following:   Height as of this encounter:  (1.905 m).   Weight as of this encounter: 87.1 kg.   Code status:   Code Status: Full Code   DVT Prophylaxis: enoxaparin (LOVENOX) injection 40 mg Start: 03/01/22 2200   Family Communication: None at bedside   Disposition Plan: Status is: Inpatient Remains inpatient appropriate because: Ongoing aspiration pneumonia-on IV antibiotics-discharge planned this coming Monday.   Planned Discharge Destination:Hospice care   Diet: Diet Order             DIET - DYS 1 Room service appropriate? Yes; Fluid consistency: Nectar Thick  Diet effective now                     Antimicrobial agents: Anti-infectives (From admission, onward)    Start     Dose/Rate Route Frequency Ordered Stop   03/02/22 0600  azithromycin (ZITHROMAX) 500 mg in sodium chloride 0.9 % 250 mL IVPB  Status:  Discontinued        500 mg 250 mL/hr over 60 Minutes Intravenous Every 24 hours 03/01/22 1728 03/03/22 1538   03/01/22 1745  Ampicillin-Sulbactam (UNASYN) 3 g in sodium chloride 0.9 % 100 mL IVPB        3 g 200 mL/hr over 30 Minutes  Intravenous Every 8 hours 03/01/22 1737 03/06/22 2159        MEDICATIONS: Scheduled Meds:  enoxaparin (LOVENOX) injection  40 mg Subcutaneous Q24H   feeding supplement (PROSource TF20)  60 mL Per Tube Daily   free water  200 mL Per Tube Q4H   LORazepam  0.5 mg Per Tube Daily   polyethylene glycol  17 g Per Tube Daily   sodium chloride flush  3 mL Intravenous Q12H   valproic acid  250 mg Per Tube QPM   Continuous Infusions:  ampicillin-sulbactam (UNASYN) IV 3 g (03/04/22 0544)   feeding supplement (JEVITY 1.5 CAL/FIBER) 45 mL/hr at 03/04/22 0500   PRN Meds:.acetaminophen **OR** acetaminophen, albuterol, atropine, atropine, bisacodyl, food thickener, guaiFENesin, hydrALAZINE, LORazepam, ondansetron **OR** ondansetron (ZOFRAN) IV   I have personally reviewed following labs and imaging studies  LABORATORY DATA: CBC: Recent Labs  Lab 03/01/22 1358 03/01/22 1408 03/02/22 0346 03/03/22 0408 03/04/22 0425  WBC 4.5  --  5.2 4.4 4.2  NEUTROABS 1.4*  --   --  1.3* 1.8  HGB 10.5* 10.9* 10.4* 9.7* 10.3*  HCT 33.7* 32.0* 33.2* 30.8* 31.3*  MCV 92.8  --  92.7 92.5 88.9  PLT 152  --  159 135* 132*    Basic Metabolic Panel: Recent Labs  Lab 03/01/22 1358 03/01/22 1408 03/02/22 0346 03/03/22 0408 03/03/22 1615 03/04/22 0425  NA 140 138 140 142  --  142  K 4.4 4.3 4.8 4.4  --  4.1  CL 104  --  106 107  --  107  CO2 28  --  27 28  --  30  GLUCOSE 101*  --  58* 84  --  93  BUN 29*  --  24* 20  --  22  CREATININE 1.63*  --  1.42* 1.32*  --  1.08  CALCIUM 9.1  --  9.3 9.3  --  9.3  MG  --   --   --  1.9 1.8 1.9  PHOS  --   --   --   --  3.0 3.3    GFR: Estimated Creatinine Clearance: 69.5 mL/min (by C-G formula based on SCr of 1.08 mg/dL).  Liver Function Tests: Recent Labs  Lab 03/01/22 1358  AST 16  ALT 16  ALKPHOS 63  BILITOT 0.2*  PROT 7.0  ALBUMIN 3.2*   No results for input(s): "LIPASE", "AMYLASE" in the last 168 hours. Recent Labs  Lab 03/01/22 1358   AMMONIA 23    Coagulation Profile: Recent Labs  Lab 03/01/22 1358  INR 1.1    Cardiac Enzymes: No results for input(s): "CKTOTAL", "CKMB", "CKMBINDEX", "TROPONINI" in the last 168 hours.  BNP (last 3 results) No results for input(s): "PROBNP" in the last 8760 hours.  Lipid Profile: No results for input(s): "CHOL", "HDL", "LDLCALC", "TRIG", "CHOLHDL", "LDLDIRECT" in the last 72 hours.  Thyroid Function Tests: Recent Labs    03/01/22 1358  TSH 9.132*  FREET4 0.90    Anemia Panel: No results for input(s): "VITAMINB12", "FOLATE", "FERRITIN", "TIBC", "IRON", "RETICCTPCT" in the last 72 hours.  Urine analysis:    Component Value Date/Time   COLORURINE YELLOW 03/01/2022 1336   APPEARANCEUR CLEAR 03/01/2022 1336   LABSPEC 1.016 03/01/2022 1336   PHURINE 6.0 03/01/2022 1336   GLUCOSEU NEGATIVE 03/01/2022 1336   HGBUR NEGATIVE 03/01/2022 1336   BILIRUBINUR NEGATIVE 03/01/2022 1336   KETONESUR NEGATIVE 03/01/2022 1336   PROTEINUR NEGATIVE 03/01/2022 1336   NITRITE NEGATIVE 03/01/2022 1336   LEUKOCYTESUR NEGATIVE 03/01/2022 1336    Sepsis Labs: Lactic Acid, Venous    Component Value Date/Time   LATICACIDVEN 1.2 03/01/2022 1358    MICROBIOLOGY: Recent Results (from the past 240 hour(s))  Resp Panel by RT-PCR (Flu A&B, Covid) Anterior Nasal Swab     Status: None   Collection Time: 03/01/22  1:10 PM   Specimen: Anterior Nasal Swab  Result Value Ref Range Status   SARS Coronavirus 2 by RT PCR NEGATIVE NEGATIVE Final    Comment: (NOTE) SARS-CoV-2 target nucleic acids are NOT DETECTED.  The SARS-CoV-2 RNA is generally detectable in upper respiratory specimens during the acute phase of infection. The lowest concentration of SARS-CoV-2 viral copies this assay can detect is 138 copies/mL. A negative result does not preclude SARS-Cov-2 infection and should not be used as the sole basis for treatment or other patient management decisions. A negative result may occur  with  improper specimen collection/handling, submission of specimen other than nasopharyngeal swab, presence of viral mutation(s) within the areas targeted by this assay, and inadequate number of viral copies(<138 copies/mL). A negative result must be combined with clinical observations, patient history, and epidemiological information. The expected result is Negative.  Fact Sheet for Patients:  BloggerCourse.com  Fact Sheet for Healthcare Providers:  SeriousBroker.it  This test is no t yet approved or cleared by the [place] FDA and  has been authorized for detection and/or diagnosis of  SARS-CoV-2 by FDA under an Emergency Use Authorization (EUA). This EUA will remain  in effect (meaning this test can be used) for the duration of the COVID-19 declaration under Section 564(b)(1) of the Act, 21 U.S.C.section 360bbb-3(b)(1), unless the authorization is terminated  or revoked sooner.       Influenza A by PCR NEGATIVE NEGATIVE Final   Influenza B by PCR NEGATIVE NEGATIVE Final    Comment: (NOTE) The Xpert Xpress SARS-CoV-2/FLU/RSV plus assay is intended as an aid in the diagnosis of influenza from Nasopharyngeal swab specimens and should not be used as a sole basis for treatment. Nasal washings and aspirates are unacceptable for Xpert Xpress SARS-CoV-2/FLU/RSV testing.  Fact Sheet for Patients: BloggerCourse.com  Fact Sheet for Healthcare Providers: SeriousBroker.it  This test is not yet approved or cleared by the [place] FDA and has been authorized for detection and/or diagnosis of SARS-CoV-2 by FDA under an Emergency Use Authorization (EUA). This EUA will remain in effect (meaning this test can be used) for the duration of the COVID-19 declaration under Section 564(b)(1) of the Act, 21 U.S.C. section 360bbb-3(b)(1), unless the authorization is terminated  or revoked.  Performed at [facility], [address], [place], [place] [address]   Urine Culture     Status: None   Collection Time: 03/01/22  1:36 PM   Specimen: Urine, Clean Catch  Result Value Ref Range Status   Specimen Description URINE, CLEAN CATCH  Final   Special Requests NONE  Final   Culture   Final    NO GROWTH Performed at [facility], [address], [place], [place] [address]    Report Status 03/03/2022 FINAL  Final  Blood culture (routine x 2)     Status: None (Preliminary result)   Collection Time: 03/01/22  1:41 PM   Specimen: BLOOD RIGHT FOREARM  Result Value Ref Range Status   Specimen Description BLOOD RIGHT FOREARM  Final   Special Requests   Final    BOTTLES DRAWN AEROBIC AND ANAEROBIC Blood Culture results may not be optimal due to an inadequate volume of blood received in culture bottles   Culture   Final    NO GROWTH 3 DAYS Performed at [facility], [address], [place], [place] [address]    Report Status PENDING  Incomplete  Blood culture (routine x 2)     Status: Abnormal   Collection Time: 03/01/22  5:17 PM   Specimen: BLOOD  Result Value Ref Range Status   Specimen Description BLOOD SITE NOT SPECIFIED  Final   Special Requests   Final    BOTTLES DRAWN AEROBIC AND ANAEROBIC Blood Culture adequate volume   Culture  Setup Time   Final    GRAM POSITIVE COCCI IN CLUSTERS AEROBIC BOTTLE ONLY CRITICAL RESULT CALLED TO, READ BACK BY AND VERIFIED WITH: PHARMD JESSICA MILLEN ON 03/02/22 @ 1712 BY DRT    Culture (A)  Final    STAPHYLOCOCCUS CAPITIS THE SIGNIFICANCE OF ISOLATING THIS ORGANISM FROM A SINGLE SET OF BLOOD CULTURES WHEN MULTIPLE SETS ARE DRAWN IS UNCERTAIN. PLEASE NOTIFY THE MICROBIOLOGY DEPARTMENT WITHIN ONE WEEK IF SPECIATION AND SENSITIVITIES ARE REQUIRED. Performed at [facility], [address] [place], [place] [address]    Report Status 03/03/2022 FINAL  Final  Blood Culture ID Panel (Reflexed)      Status: Abnormal   Collection Time: 03/01/22  5:17 PM  Result Value Ref Range Status   Enterococcus faecalis NOT DETECTED  NOT DETECTED Final   Enterococcus Faecium NOT DETECTED NOT DETECTED Final   Listeria monocytogenes NOT DETECTED NOT DETECTED Final   Staphylococcus species DETECTED (A) NOT DETECTED Final    Comment: CRITICAL RESULT CALLED TO, READ BACK BY AND VERIFIED WITH: PHARMD JESSICA MILLEN ON 03/02/22 @ 1712 BY DRT    Staphylococcus aureus (BCID) NOT DETECTED NOT DETECTED Final   Staphylococcus epidermidis NOT DETECTED NOT DETECTED Final   Staphylococcus lugdunensis NOT DETECTED NOT DETECTED Final   Streptococcus species NOT DETECTED NOT DETECTED Final   Streptococcus agalactiae NOT DETECTED NOT DETECTED Final   Streptococcus pneumoniae NOT DETECTED NOT DETECTED Final   Streptococcus pyogenes NOT DETECTED NOT DETECTED Final   A.calcoaceticus-baumannii NOT DETECTED NOT DETECTED Final   Bacteroides fragilis NOT DETECTED NOT DETECTED Final   Enterobacterales NOT DETECTED NOT DETECTED Final   Enterobacter cloacae complex NOT DETECTED NOT DETECTED Final   Escherichia coli NOT DETECTED NOT DETECTED Final   Klebsiella aerogenes NOT DETECTED NOT DETECTED Final   Klebsiella oxytoca NOT DETECTED NOT DETECTED Final   Klebsiella pneumoniae NOT DETECTED NOT DETECTED Final   Proteus species NOT DETECTED NOT DETECTED Final   Salmonella species NOT DETECTED NOT DETECTED Final   Serratia marcescens NOT DETECTED NOT DETECTED Final   Haemophilus influenzae NOT DETECTED NOT DETECTED Final   Neisseria meningitidis NOT DETECTED NOT DETECTED Final   Pseudomonas aeruginosa NOT DETECTED NOT DETECTED Final   Stenotrophomonas maltophilia NOT DETECTED NOT DETECTED Final   Candida albicans NOT DETECTED NOT DETECTED Final   Candida auris NOT DETECTED NOT DETECTED Final   Candida glabrata NOT DETECTED NOT DETECTED Final   Candida krusei NOT DETECTED NOT DETECTED Final   Candida parapsilosis NOT  DETECTED NOT DETECTED Final   Candida tropicalis NOT DETECTED NOT DETECTED Final   Cryptococcus neoformans/gattii NOT DETECTED NOT DETECTED Final    Comment: Performed at [facility], [address] [place], [place] [address]    RADIOLOGY STUDIES/RESULTS: DG Swallowing Func-Speech Pathology  Result Date: 03/03/2022 Table formatting from the original result was not included. Objective Swallowing Evaluation: Type of Study: MBS-Modified Barium Swallow Study  Patient Details Name: Jack Rivas MRN: [id number] Date of Birth: 12-Jun-1946 Today's Date: 03/03/2022 Time: SLP Start Time (ACUTE ONLY): 1238 -SLP Stop Time (ACUTE ONLY): 1306 SLP Time Calculation (min) (ACUTE ONLY): 28 min Past Medical History: Past Medical History: Diagnosis Date  Dementia (HCC)   Hypertension   Uses feeding tube  Past Surgical History: Past Surgical History: Procedure Laterality Date  CATARACT EXTRACTION, BILATERAL    KNEE SURGERY   HPI: Pt is a 76 y/o male who presented with AMS. He was admitted with sepsis and suspected aspiration pneumonia. CXR on admission negative for active disease. PMH: dementia, HTN, dysphagia, aspiration PNA, G-tube, pt currently on hospice. MBS 10/27/21: moderate oropharyngeal dysphagia with Impaired oral control. intermittent oral retention, a pharyngeal delay, penetration (thin and nectar via straw) and aspiration. A dysphagia 1 diet with nectar thick liquids via cup was recommended.  Subjective: pt awake in bed, dysarthric  Recommendations for follow up therapy are one component of a multi-disciplinary discharge planning process, led by the attending physician.  Recommendations may be updated based on patient status, additional functional criteria and insurance authorization. Assessment / Plan / Recommendation   03/03/2022   4:39 PM Clinical Impressions Clinical Impression Despite repositioning, the view of the larynx was partially obscured by the pt's shoulders. Pt presents with oropharyngeal dysphagia  characterized by reduced bolus cohesion, a pharyngeal delay,  weak lingual manipulation, and reduction in tongue base retraction, hyolaryngeal elevation, anterior laryngeal movement, and epiglottic inversion. He demonstrated lingual residue, vallecular residue, reduced laryngeal closure, and pyriform sinus residue. Penetration (PAS 3,5) and silent aspiration (PAS 8) were noted with thin liquids and with nectar thick liquids via straw. Pt was inconsistently able to demonstrate prompted coughing which was ineffective in expelling aspirated material. No penetration/aspiration were noted with honey thick liquids, but increased pharyngeal residue was noted with this consistency. Despite multiple attempts, pt was unable to swallow a 13mm barium tablet that was embedded in puree, so it was ultimately removed. Pt's swallow function appears similar to that noted in April. A dysphagia 1 diet with nectar thick liquids is recommended at this time with strict observance of swallowing precautions including avoidance of straws. SLP will continue to follow pt. SLP Visit Diagnosis Dysphagia, oropharyngeal phase (R13.12) Impact on safety and function Moderate aspiration risk;Mild aspiration risk     03/03/2022   4:39 PM Treatment Recommendations Treatment Recommendations Therapy as outlined in treatment plan below     03/03/2022   4:39 PM Prognosis Prognosis for Safe Diet Advancement Guarded Barriers to Reach Goals Time post onset;Behavior;Severity of deficits;Cognitive deficits   03/03/2022   4:39 PM Diet Recommendations SLP Diet Recommendations Dysphagia 1 (Puree) solids;Nectar thick liquid Liquid Administration via Cup;No straw Medication Administration Crushed with puree Compensations Slow rate;Small sips/bites Postural Changes Seated upright at 90 degrees;Remain semi-upright after after feeds/meals (Comment)     03/03/2022   4:39 PM Other Recommendations Other Recommendations Order thickener from pharmacy;Have oral suction available  Follow Up Recommendations Follow physician's recommendations for discharge plan and follow up therapies Assistance recommended at discharge Frequent or constant Supervision/Assistance Functional Status Assessment Patient has had a recent decline in their functional status and/or demonstrates limited ability to make significant improvements in function in a reasonable and predictable amount of time   03/03/2022   4:39 PM Frequency and Duration  Speech Therapy Frequency (ACUTE ONLY) min 1 x/week Treatment Duration 1 week     03/03/2022  12:38 PM Oral Phase Oral Phase Impaired Oral - Honey Cup Decreased bolus cohesion;Premature spillage;Weak lingual manipulation;Lingual/palatal residue Oral - Nectar Cup Decreased bolus cohesion;Premature spillage;Weak lingual manipulation;Lingual/palatal residue Oral - Nectar Straw Decreased bolus cohesion;Premature spillage;Weak lingual manipulation;Lingual/palatal residue Oral - Thin Cup Decreased bolus cohesion;Premature spillage;Weak lingual manipulation;Lingual/palatal residue Oral - Thin Straw Decreased bolus cohesion;Premature spillage;Weak lingual manipulation;Lingual/palatal residue Oral - Puree Decreased bolus cohesion;Premature spillage;Weak lingual manipulation;Lingual/palatal residue Oral - Mech Soft NT Oral - Regular Weak lingual manipulation;Lingual/palatal residue;Impaired mastication    03/03/2022   4:39 PM Pharyngeal Phase Pharyngeal Material enters airway, remains ABOVE vocal cords then ejected out Pharyngeal Material enters airway, passes BELOW cords without attempt by patient to eject out (silent aspiration);Material enters airway, passes BELOW cords and not ejected out despite cough attempt by patient Pharyngeal Material enters airway, passes BELOW cords without attempt by patient to eject out (silent aspiration) Pharyngeal Material enters airway, passes BELOW cords without attempt by patient to eject out (silent aspiration)    03/03/2022  12:38 PM Cervical Esophageal  Phase  Cervical Esophageal Phase [facility] Shanika I. Vear ClockPhillips, MS, CCC-SLP Acute Rehabilitation Services Office number [phone] Scheryl MartenShanika I Phillips 03/03/2022, 4:41 PM                       LOS: 3 days   Jack MassedShanker Dreyden Rohrman, MD  [facility]    To contact the attending provider between 7A-7P or the covering provider  during after hours 7P-7A, please log into the web site [email or web address] and access using universal [place] password for that web site. If you do not have the password, please call the hospital operator.  03/04/2022, 10:08 AM

## 2022-03-04 NOTE — Progress Notes (Signed)
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  Per Dr. with [facility], this is a related hospital admission.    Visited patient at bedside. No family present. Exchanged report with Whitney Post, RN who states that patient received PRN geodon and PRN ativan due to agitation and combativeness. Patient is currently in mitten restraints.    Patient remains inpatient appropriate due to Ongoing aspiration pneumonia-on IV antibiotics   V/S: 97.3, 42, 44, 8, 110/78, 100% on RA   I/O:  none documented    Abnormal Labs:  RBC 4.22 - 5.81 MIL/uL 3.52 (L)  Hemoglobin 13.0 - 17.0 g/dL 93.9 (L)  HCT 03.0 - 09.2 % 31.3 (L)  Platelets 150 - 400 K/uL 132 (L)  Eosinophils Absolute 0.0 - 0.5 K/uL 0.7 (H)    Diagnostics: none    IV/PRN: IV ativan 0.5mg  per tube x 1, IM geodon 10mg , IV unasyn 3g IVPB, IV zithromax 500mg  IVPB,    Problem List:Sepsis due to aspiration PNA: Sepsis physiology has improved-he is afebrile-no leukocytosis-given his overall poor health-suspect he is overall improved and close to his usual baseline.  Culture data as above.  Continue Unasyn as planned x5 days total.  Given advanced dementia//dysphagia-he remains at risk for recurrent aspiration episodes and subsequent decompensation.  Maintain aspiration precautions at all times.   Acute metabolic encephalopathy: Due to aspiration PNA-suspect he is now back to baseline.   AKI: Hemodynamically mediated-resolved.   Acute sinusitis: Seen on CT imaging-on appropriate antibiotics.   Coag negative staph bacteremia: No indication for treatment-likely a contamination.   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  Per chart review-[name] family wants to continue full scope of treatment.  Continue supportive care-PEG tube feeds.   Sinus bradycardia/episodic junctional bradycardia: Evaluated by cardiology-no indication for PPM or cardiac interventions-suspect that even if he were to have a hemodynamically unstable event-he will be a poor candidate for aggressive care.  Continue to avoid AV nodal blocking agents.   Cervical degenerative disc disease: CT C-spine findings reviewed-do not think he would be a candidate for any sort of surgery at this point.  Supportive care for now.   Palliative care: Full code for now-Per review of prior notes-family wanted to continue full scope of treatment.  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  Will await further recommendations from palliative care team-they are in the process of having ongoing goals of care discussion with the patient's daughter.   Nutrition Status: Nutrition Problem: Moderate Malnutrition Etiology: chronic illness Signs/Symptoms: mild fat depletion, mild muscle depletion Interventions: MVI, Tube feeding   BMI: Estimated body mass index is 24 kg/m as calculated from the following:   Height as of this encounter: 6\' 3"  (1.905 m).   Weight as of this encounter: 87.1 kg.    Discharge Planning: ongoing, plan is to discharge home on Monday with daughter .    Family Contact: not present at bedside. Phone call.    IDT: Updated   Goals of  Care: plan is to return home with family on Monday to continue with hospice. DME delivery has been arranged.     Please do not hesitate to call with any hospice related questions or concerns.   At discharge, please use [facility] for transport, they contract this service for our active hospice patients.  [facility] [place] Liaison  [phone]
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
## 2022-03-04 NOTE — Progress Notes (Signed)
Pharmacy Antibiotic Note  Jack Rivas is a 76 y.o. male admitted on 03/01/2022 presenting AMS, concern for aspiration.  Pharmacy has been consulted for Unasyn dosing. Stop date in place for 03/06/22. WBC's are not elevated and patient remains afebrile.   Microbiology:  8/16 Blood cx: staph capitis, growing in 1/4, possible contaminant  8/16 Urine cx: NG Final    Plan: Continue Unasyn 3g IV every 8 hours Monitor renal function, clinical progression and LOT  Height: 6\' 3"  (190.5 cm) Weight: 87.1 kg (192 lb 0.3 oz) IBW/kg (Calculated) : 84.5  Temp (24hrs), Avg:97.9 F (36.6 C), Min:97.3 F (36.3 C), Max:98.2 F (36.8 C)  Recent Labs  Lab 03/01/22 1358 03/02/22 0346 03/03/22 0408 03/04/22 0425  WBC 4.5 5.2 4.4 4.2  CREATININE 1.63* 1.42* 1.32* 1.08  LATICACIDVEN 1.2  --   --   --      Estimated Creatinine Clearance: 69.5 mL/min (by C-G formula based on SCr of 1.08 mg/dL).    No Known Allergies   03/06/22, PharmD  PGY1 Pharmacy Resident

## 2022-03-05 DIAGNOSIS — R1310 Dysphagia, unspecified: Secondary | ICD-10-CM

## 2022-03-05 DIAGNOSIS — A419 Sepsis, unspecified organism: Secondary | ICD-10-CM | POA: Diagnosis not present

## 2022-03-05 DIAGNOSIS — J690 Pneumonitis due to inhalation of food and vomit: Secondary | ICD-10-CM | POA: Diagnosis not present

## 2022-03-05 DIAGNOSIS — N179 Acute kidney failure, unspecified: Secondary | ICD-10-CM

## 2022-03-05 DIAGNOSIS — N189 Chronic kidney disease, unspecified: Secondary | ICD-10-CM

## 2022-03-05 LAB — GLUCOSE, CAPILLARY
Glucose-Capillary: 87 mg/dL (ref 70–99)
Glucose-Capillary: 113 mg/dL — ABNORMAL HIGH (ref 70–99)
Glucose-Capillary: 97 mg/dL (ref 70–99)
Glucose-Capillary: 107 mg/dL — ABNORMAL HIGH (ref 70–99)
Glucose-Capillary: 105 mg/dL — ABNORMAL HIGH (ref 70–99)
Glucose-Capillary: 96 mg/dL (ref 70–99)

## 2022-03-05 LAB — PHOSPHORUS: Phosphorus: 3.4 mg/dL (ref 2.5–4.6)

## 2022-03-05 LAB — MAGNESIUM: Magnesium: 1.8 mg/dL (ref 1.7–2.4)

## 2022-03-05 NOTE — Progress Notes (Signed)
PROGRESS NOTE        PATIENT DETAILS Name: Jack Rivas Age: 76 y.o. Sex: male Date of Birth: 09-25-1945 Admit Date: 03/01/2022 Admitting Physician Jonah BlueJennifer Yates, MD ZOX:[id number]:Patient, No Pcp Per  Brief Summary: Patient is a 76 y.o.  male advanced dementia-with bedbound status-severe dysphagia s/p PEG tube placement-who presented from SNF for worsening mental status-was found to have sepsis due to aspiration PNA causing acute metabolic encephalopathy.  See below for further details.   Significant events: 8/16>> admit to [facility]-from SNF-sepsis-aspiration pneumonia-encephalopathy  Significant studies: 8/16>> CXR: No active disease. 8/16>> CT head: No acute intracranial abnormality.  Probable acute right maxillary/sphenoid sinusitis noted. 8/16>> CT C-spine: No fracture-increased destruction of the adjacent articular process of left-sided C2-C3 facet joint-most consistent with worsening degenerative joint disease.   Significant microbiology data: 8/16>> COVID/influenza PCR: Negative 8/16>> blood culture: 1/2-Staphylococcus capitis-likely a contamination. 8/16>> urine culture: No growth  Procedures: None  Consults: Active care/cardiology  Subjective: More awake compared to yesterday-tracks my movement.  Attempts to talk but is mostly nonverbal.  Objective: Vitals: Blood pressure 139/69, pulse 63, temperature (!) 97 F (36.1 C), temperature source Axillary, resp. rate 14, height 6\' 3"  (1.905 m), weight 87.9 kg, SpO2 98 %.   Exam: Gen Exam: Awake-chronically frail--not in any distress HEENT:atraumatic, normocephalic Chest: B/L clear to auscultation anteriorly CVS:S1S2 regular Abdomen:soft non tender, non distended Extremities:no edema Skin: no rash   Pertinent Labs/Radiology:    Latest Ref Rng & Units 03/04/2022    4:25 AM 03/03/2022    4:08 AM 03/02/2022    3:46 AM  CBC  WBC 4.0 - 10.5 K/uL 4.2  4.4  5.2   Hemoglobin 13.0 - 17.0 g/dL 09.810.3  9.7  11.910.4    Hematocrit 39.0 - 52.0 % 31.3  30.8  33.2   Platelets 150 - 400 K/uL 132  135  159     Lab Results  Component Value Date   NA 142 03/04/2022   K 4.1 03/04/2022   CL 107 03/04/2022   CO2 30 03/04/2022      Assessment/Plan: Sepsis due to aspiration PNA: Sepsis physiology resolved-continue Unasyn x5 days total.  Unfortunately given his dementia/dysphagia-he will remain at risk for recurrent aspiration episodes and decompensation.  Continue to maintain aspiration precautions.    Acute metabolic encephalopathy: Due to aspiration PNA-suspect he is now back to baseline.  AKI: Hemodynamically mediated-resolved.  Acute sinusitis: Seen on CT imaging-on appropriate antibiotics.  Coag negative staph bacteremia: No indication for treatment-likely a contamination.  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  Per chart review-[name] family wants to continue full scope of treatment.  Continue supportive care-PEG tube feeds.  Sinus bradycardia/episodic junctional bradycardia: Evaluated by cardiology-no indication for PPM or cardiac interventions-suspect that even if he were to have a hemodynamically unstable event-he will be a poor candidate for aggressive care.  Continue to avoid AV nodal blocking agents.  Cervical degenerative disc disease: CT C-spine findings reviewed-do not think he would be a candidate for any sort of surgery at this point.  Supportive care for now.  Palliative care: Full code for now-Per review of prior notes-family wanted to continue full scope of treatment.  Unfortunately-he has advanced dementia-with dysphagia requiring PEG tube placement-with severe  failure to thrive syndrome-he apparently is bedbound and dependent on others for all activities of daily  living-his overall prognosis is poor-he will likely continue to have episodes of recurrent aspiration pneumonia-and will remain at risk for significant morbidity and mortality.  Will await further recommendations from palliative care team-they are in the process of having ongoing goals of care discussion with the patient's daughter.  Nutrition Status: Nutrition Problem: Moderate Malnutrition Etiology: chronic illness Signs/Symptoms: mild fat depletion, mild muscle depletion Interventions: MVI, Tube feeding  BMI: Estimated body mass index is 24.22 kg/m as calculated from the following:   Height as of this encounter:  (1.905 m).   Weight as of this encounter: 87.9 kg.   Code status:   Code Status: Full Code   DVT Prophylaxis: enoxaparin (LOVENOX) injection 40 mg Start: 03/01/22 2200   Family Communication: Daughter-[name] updated over the phone on 8/20   Disposition Plan: Status is: Inpatient Remains inpatient appropriate because: Ongoing aspiration pneumonia-on IV antibiotics-discharge planned this coming Monday.   Planned Discharge Destination:Hospice care   Diet: Diet Order             DIET - DYS 1 Room service appropriate? Yes; Fluid consistency: Nectar Thick  Diet effective now                     Antimicrobial agents: Anti-infectives (From admission, onward)    Start     Dose/Rate Route Frequency Ordered Stop   03/02/22 0600  azithromycin (ZITHROMAX) 500 mg in sodium chloride 0.9 % 250 mL IVPB  Status:  Discontinued        500 mg 250 mL/hr over 60 Minutes Intravenous Every 24 hours 03/01/22 1728 03/03/22 1538   03/01/22 1745  Ampicillin-Sulbactam (UNASYN) 3 g in sodium chloride 0.9 % 100 mL IVPB        3 g 200 mL/hr over 30 Minutes Intravenous Every 8 hours 03/01/22 1737 03/06/22 2159        MEDICATIONS: Scheduled Meds:  enoxaparin (LOVENOX) injection  40 mg Subcutaneous Q24H   feeding supplement (PROSource TF20)  60 mL Per Tube  Daily   free water  200 mL Per Tube Q4H   LORazepam  0.5 mg Per Tube Daily   polyethylene glycol  17 g Per Tube Daily   sodium chloride flush  3 mL Intravenous Q12H   valproic acid  250 mg Per Tube QPM   Continuous Infusions:  ampicillin-sulbactam (UNASYN) IV 3 g (03/05/22 0541)   feeding supplement (JEVITY 1.5 CAL/FIBER) 1,000 mL (03/04/22 2309)   PRN Meds:.acetaminophen **OR** acetaminophen, albuterol, atropine, atropine, bisacodyl, food thickener, guaiFENesin, hydrALAZINE, LORazepam, ondansetron **OR** ondansetron (ZOFRAN) IV   I have personally reviewed following labs and imaging studies  LABORATORY DATA: CBC: Recent Labs  Lab 03/01/22 1358 03/01/22 1408 03/02/22 0346 03/03/22 0408 03/04/22 0425  WBC 4.5  --  5.2 4.4 4.2  NEUTROABS 1.4*  --   --  1.3* 1.8  HGB 10.5* 10.9* 10.4* 9.7* 10.3*  HCT 33.7* 32.0* 33.2* 30.8* 31.3*  MCV 92.8  --  92.7 92.5 88.9  PLT 152  --  159 135* 132*     Basic Metabolic Panel: Recent Labs  Lab 03/01/22 1358 03/01/22 1408 03/02/22 0346 03/03/22 0408 03/03/22 1615 03/04/22 0425 03/04/22 1637 03/05/22 0713  NA 140 138 140 142  --  142  --   --   K 4.4 4.3 4.8 4.4  --  4.1  --   --   CL 104  --  106 107  --  107  --   --   CO2 28  --  27 28  --  30  --   --   GLUCOSE 101*  --  58* 84  --  93  --   --   BUN 29*  --  24* 20  --  22  --   --   CREATININE 1.63*  --  1.42* 1.32*  --  1.08  --   --   CALCIUM 9.1  --  9.3 9.3  --  9.3  --   --   MG  --   --   --  1.9 1.8 1.9 1.8 1.8  PHOS  --   --   --   --  3.0 3.3 3.6 3.4     GFR: Estimated Creatinine Clearance: 69.5 mL/min (by C-G formula based on SCr of 1.08 mg/dL).  Liver Function Tests: Recent Labs  Lab 03/01/22 1358  AST 16  ALT 16  ALKPHOS 63  BILITOT 0.2*  PROT 7.0  ALBUMIN 3.2*    No results for input(s): "LIPASE", "AMYLASE" in the last 168 hours. Recent Labs  Lab 03/01/22 1358  AMMONIA 23     Coagulation Profile: Recent Labs  Lab 03/01/22 1358  INR  1.1     Cardiac Enzymes: No results for input(s): "CKTOTAL", "CKMB", "CKMBINDEX", "TROPONINI" in the last 168 hours.  BNP (last 3 results) No results for input(s): "PROBNP" in the last 8760 hours.  Lipid Profile: No results for input(s): "CHOL", "HDL", "LDLCALC", "TRIG", "CHOLHDL", "LDLDIRECT" in the last 72 hours.  Thyroid Function Tests: No results for input(s): "TSH", "T4TOTAL", "FREET4", "T3FREE", "THYROIDAB" in the last 72 hours.   Anemia Panel: No results for input(s): "VITAMINB12", "FOLATE", "FERRITIN", "TIBC", "IRON", "RETICCTPCT" in the last 72 hours.  Urine analysis:    Component Value Date/Time   COLORURINE YELLOW 03/01/2022 1336   APPEARANCEUR CLEAR 03/01/2022 1336   LABSPEC 1.016 03/01/2022 1336   PHURINE 6.0 03/01/2022 1336   GLUCOSEU NEGATIVE 03/01/2022 1336   HGBUR NEGATIVE 03/01/2022 1336   BILIRUBINUR NEGATIVE 03/01/2022 1336   KETONESUR NEGATIVE 03/01/2022 1336   PROTEINUR NEGATIVE 03/01/2022 1336   NITRITE NEGATIVE 03/01/2022 1336   LEUKOCYTESUR NEGATIVE 03/01/2022 1336    Sepsis Labs: Lactic Acid, Venous    Component Value Date/Time   LATICACIDVEN 1.2 03/01/2022 1358    MICROBIOLOGY: Recent Results (from the past 240 hour(s))  Resp Panel by RT-PCR (Flu A&B, Covid) Anterior Nasal Swab     Status: None   Collection Time: 03/01/22  1:10 PM   Specimen: Anterior Nasal Swab  Result Value Ref Range Status   SARS Coronavirus 2 by RT PCR NEGATIVE NEGATIVE Final    Comment: (NOTE) SARS-CoV-2 target nucleic acids are NOT DETECTED.  The SARS-CoV-2 RNA is generally detectable in upper respiratory specimens during the acute phase of infection. The lowest concentration of SARS-CoV-2 viral copies this assay can detect is 138 copies/mL. A negative result does not preclude SARS-Cov-2 infection and should not be used as the sole basis for treatment or other patient management decisions. A negative result may occur with  improper specimen  collection/handling, submission of specimen other than nasopharyngeal swab, presence of viral mutation(s) within the areas targeted by this assay, and inadequate number of viral copies(<138 copies/mL). A negative result must be combined with clinical observations, patient history, and epidemiological information. The expected result is Negative.  Fact Sheet for Patients:  BloggerCourse.com  Fact Sheet for Healthcare Providers:  SeriousBroker.it  This test is no t yet approved or cleared by the [place] and  has been authorized for detection and/or diagnosis of SARS-CoV-2 by FDA under an Emergency Use Authorization (EUA). This EUA will remain  in effect (meaning this test can be used) for the duration of the COVID-19 declaration under Section 564(b)(1) of the Act, 21 U.S.C.section 360bbb-3(b)(1), unless the authorization is terminated  or revoked sooner.       Influenza A by PCR NEGATIVE NEGATIVE Final   Influenza B by PCR NEGATIVE NEGATIVE Final    Comment: (NOTE) The Xpert Xpress SARS-CoV-2/FLU/RSV plus assay is intended as an aid in the diagnosis of influenza from Nasopharyngeal swab specimens and should not be used as a sole basis for treatment. Nasal washings and aspirates are unacceptable for Xpert Xpress SARS-CoV-2/FLU/RSV testing.  Fact Sheet for Patients: BloggerCourse.com  Fact Sheet for Healthcare Providers: SeriousBroker.it  This test is not yet approved or cleared by the [place] FDA and has been authorized for detection and/or diagnosis of SARS-CoV-2 by FDA under an Emergency Use Authorization (EUA). This EUA will remain in effect (meaning this test can be used) for the duration of the COVID-19 declaration under Section 564(b)(1) of the Act, 21 U.S.C. section 360bbb-3(b)(1), unless the authorization is terminated or revoked.  Performed at [facility], [address] [place], [place] [address]   Urine Culture     Status: None   Collection Time: 03/01/22  1:36 PM   Specimen: Urine, Clean Catch  Result Value Ref Range Status   Specimen Description URINE, CLEAN CATCH  Final   Special Requests NONE  Final   Culture   Final    NO GROWTH Performed at [facility], [address], [place], [place] [address]    Report Status 03/03/2022 FINAL  Final  Blood culture (routine x 2)     Status: None (Preliminary result)   Collection Time: 03/01/22  1:41 PM   Specimen: BLOOD RIGHT FOREARM  Result Value Ref Range Status   Specimen Description BLOOD RIGHT FOREARM  Final   Special Requests   Final    BOTTLES DRAWN AEROBIC AND ANAEROBIC Blood Culture results may not be optimal due to an inadequate volume of blood received in culture bottles   Culture   Final    NO GROWTH 4 DAYS Performed at [facility], [address] [place], [place] [address]    Report Status PENDING  Incomplete  Blood culture (routine x 2)     Status: Abnormal   Collection Time: 03/01/22  5:17 PM   Specimen: BLOOD  Result Value Ref Range Status   Specimen Description BLOOD SITE NOT SPECIFIED  Final   Special Requests   Final    BOTTLES DRAWN AEROBIC AND ANAEROBIC Blood Culture adequate volume   Culture  Setup Time   Final    GRAM POSITIVE COCCI IN CLUSTERS AEROBIC BOTTLE ONLY CRITICAL RESULT CALLED TO, READ BACK BY AND VERIFIED WITH: PHARMD JESSICA MILLEN ON 03/02/22 @ 1712 BY DRT    Culture (A)  Final    STAPHYLOCOCCUS CAPITIS THE SIGNIFICANCE OF ISOLATING THIS ORGANISM FROM A SINGLE SET OF BLOOD CULTURES WHEN MULTIPLE SETS ARE DRAWN IS UNCERTAIN. PLEASE NOTIFY THE MICROBIOLOGY DEPARTMENT WITHIN ONE WEEK IF SPECIATION AND SENSITIVITIES ARE REQUIRED. Performed at [facility], [address], [place], [place] [address]    Report Status 03/03/2022 FINAL  Final  Blood Culture ID Panel (Reflexed)  Status: Abnormal   Collection Time:  03/01/22  5:17 PM  Result Value Ref Range Status   Enterococcus faecalis NOT DETECTED NOT DETECTED Final   Enterococcus Faecium NOT DETECTED NOT DETECTED Final   Listeria monocytogenes NOT DETECTED NOT DETECTED Final   Staphylococcus species DETECTED (A) NOT DETECTED Final    Comment: CRITICAL RESULT CALLED TO, READ BACK BY AND VERIFIED WITH: PHARMD JESSICA MILLEN ON 03/02/22 @ 1712 BY DRT    Staphylococcus aureus (BCID) NOT DETECTED NOT DETECTED Final   Staphylococcus epidermidis NOT DETECTED NOT DETECTED Final   Staphylococcus lugdunensis NOT DETECTED NOT DETECTED Final   Streptococcus species NOT DETECTED NOT DETECTED Final   Streptococcus agalactiae NOT DETECTED NOT DETECTED Final   Streptococcus pneumoniae NOT DETECTED NOT DETECTED Final   Streptococcus pyogenes NOT DETECTED NOT DETECTED Final   A.calcoaceticus-baumannii NOT DETECTED NOT DETECTED Final   Bacteroides fragilis NOT DETECTED NOT DETECTED Final   Enterobacterales NOT DETECTED NOT DETECTED Final   Enterobacter cloacae complex NOT DETECTED NOT DETECTED Final   Escherichia coli NOT DETECTED NOT DETECTED Final   Klebsiella aerogenes NOT DETECTED NOT DETECTED Final   Klebsiella oxytoca NOT DETECTED NOT DETECTED Final   Klebsiella pneumoniae NOT DETECTED NOT DETECTED Final   Proteus species NOT DETECTED NOT DETECTED Final   Salmonella species NOT DETECTED NOT DETECTED Final   Serratia marcescens NOT DETECTED NOT DETECTED Final   Haemophilus influenzae NOT DETECTED NOT DETECTED Final   Neisseria meningitidis NOT DETECTED NOT DETECTED Final   Pseudomonas aeruginosa NOT DETECTED NOT DETECTED Final   Stenotrophomonas maltophilia NOT DETECTED NOT DETECTED Final   Candida albicans NOT DETECTED NOT DETECTED Final   Candida auris NOT DETECTED NOT DETECTED Final   Candida glabrata NOT DETECTED NOT DETECTED Final   Candida krusei NOT DETECTED NOT DETECTED Final   Candida parapsilosis NOT DETECTED NOT DETECTED Final   Candida  tropicalis NOT DETECTED NOT DETECTED Final   Cryptococcus neoformans/gattii NOT DETECTED NOT DETECTED Final    Comment: Performed at [facility], [address], [place], [place] [address]    RADIOLOGY STUDIES/RESULTS: DG Swallowing Func-Speech Pathology  Result Date: 03/03/2022 Table formatting from the original result was not included. Objective Swallowing Evaluation: Type of Study: MBS-Modified Barium Swallow Study  Patient Details Name: Jack Rivas MRN: [id number] Date of Birth: 30-Oct-1945 Today's Date: 03/03/2022 Time: SLP Start Time (ACUTE ONLY): 1238 -SLP Stop Time (ACUTE ONLY): 1306 SLP Time Calculation (min) (ACUTE ONLY): 28 min Past Medical History: Past Medical History: Diagnosis Date  Dementia (HCC)   Hypertension   Uses feeding tube  Past Surgical History: Past Surgical History: Procedure Laterality Date  CATARACT EXTRACTION, BILATERAL    KNEE SURGERY   HPI: Pt is a 75 y/o male who presented with AMS. He was admitted with sepsis and suspected aspiration pneumonia. CXR on admission negative for active disease. PMH: dementia, HTN, dysphagia, aspiration PNA, G-tube, pt currently on hospice. MBS 10/27/21: moderate oropharyngeal dysphagia with Impaired oral control. intermittent oral retention, a pharyngeal delay, penetration (thin and nectar via straw) and aspiration. A dysphagia 1 diet with nectar thick liquids via cup was recommended.  Subjective: pt awake in bed, dysarthric  Recommendations for follow up therapy are one component of a multi-disciplinary discharge planning process, led by the attending physician.  Recommendations may be updated based on patient status, additional functional criteria and insurance authorization. Assessment / Plan / Recommendation   03/03/2022   4:39 PM Clinical Impressions Clinical Impression Despite repositioning, the view of  the larynx was partially obscured by the pt's shoulders. Pt presents with oropharyngeal dysphagia characterized by reduced bolus cohesion,  a pharyngeal delay, weak lingual manipulation, and reduction in tongue base retraction, hyolaryngeal elevation, anterior laryngeal movement, and epiglottic inversion. He demonstrated lingual residue, vallecular residue, reduced laryngeal closure, and pyriform sinus residue. Penetration (PAS 3,5) and silent aspiration (PAS 8) were noted with thin liquids and with nectar thick liquids via straw. Pt was inconsistently able to demonstrate prompted coughing which was ineffective in expelling aspirated material. No penetration/aspiration were noted with honey thick liquids, but increased pharyngeal residue was noted with this consistency. Despite multiple attempts, pt was unable to swallow a 65mm barium tablet that was embedded in puree, so it was ultimately removed. Pt's swallow function appears similar to that noted in April. A dysphagia 1 diet with nectar thick liquids is recommended at this time with strict observance of swallowing precautions including avoidance of straws. SLP will continue to follow pt. SLP Visit Diagnosis Dysphagia, oropharyngeal phase (R13.12) Impact on safety and function Moderate aspiration risk;Mild aspiration risk     03/03/2022   4:39 PM Treatment Recommendations Treatment Recommendations Therapy as outlined in treatment plan below     03/03/2022   4:39 PM Prognosis Prognosis for Safe Diet Advancement Guarded Barriers to Reach Goals Time post onset;Behavior;Severity of deficits;Cognitive deficits   03/03/2022   4:39 PM Diet Recommendations SLP Diet Recommendations Dysphagia 1 (Puree) solids;Nectar thick liquid Liquid Administration via Cup;No straw Medication Administration Crushed with puree Compensations Slow rate;Small sips/bites Postural Changes Seated upright at 90 degrees;Remain semi-upright after after feeds/meals (Comment)     03/03/2022   4:39 PM Other Recommendations Other Recommendations Order thickener from pharmacy;Have oral suction available Follow Up Recommendations Follow  physician's recommendations for discharge plan and follow up therapies Assistance recommended at discharge Frequent or constant Supervision/Assistance Functional Status Assessment Patient has had a recent decline in their functional status and/or demonstrates limited ability to make significant improvements in function in a reasonable and predictable amount of time   03/03/2022   4:39 PM Frequency and Duration  Speech Therapy Frequency (ACUTE ONLY) min 1 x/week Treatment Duration 1 week     03/03/2022  12:38 PM Oral Phase Oral Phase Impaired Oral - Honey Cup Decreased bolus cohesion;Premature spillage;Weak lingual manipulation;Lingual/palatal residue Oral - Nectar Cup Decreased bolus cohesion;Premature spillage;Weak lingual manipulation;Lingual/palatal residue Oral - Nectar Straw Decreased bolus cohesion;Premature spillage;Weak lingual manipulation;Lingual/palatal residue Oral - Thin Cup Decreased bolus cohesion;Premature spillage;Weak lingual manipulation;Lingual/palatal residue Oral - Thin Straw Decreased bolus cohesion;Premature spillage;Weak lingual manipulation;Lingual/palatal residue Oral - Puree Decreased bolus cohesion;Premature spillage;Weak lingual manipulation;Lingual/palatal residue Oral - Mech Soft NT Oral - Regular Weak lingual manipulation;Lingual/palatal residue;Impaired mastication    03/03/2022   4:39 PM Pharyngeal Phase Pharyngeal Material enters airway, remains ABOVE vocal cords then ejected out Pharyngeal Material enters airway, passes BELOW cords without attempt by patient to eject out (silent aspiration);Material enters airway, passes BELOW cords and not ejected out despite cough attempt by patient Pharyngeal Material enters airway, passes BELOW cords without attempt by patient to eject out (silent aspiration) Pharyngeal Material enters airway, passes BELOW cords without attempt by patient to eject out (silent aspiration)    03/03/2022  12:38 PM Cervical Esophageal Phase  Cervical Esophageal Phase  [facility] Shanika I. Vear Clock, MS, CCC-SLP Acute Rehabilitation Services Office number [phone] Scheryl Marten 03/03/2022, 4:41 PM                       LOS: 4  days   Jeoffrey Massed, MD  [facility]    To contact the attending provider between 7A-7P or the covering provider during after hours 7P-7A, please log into the web site [email or web address] and access using universal [place] password for that web site. If you do not have the password, please call the hospital operator.  03/05/2022, 10:33 AM

## 2022-03-05 NOTE — Progress Notes (Signed)
[facility] 5W13C 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  Admitted [name] on 03/01/2022 with a diagnosis of Septic Shock due to undetermined organism and aspiration pneumonia.  Per Dr. with [facility], this is a related hospital admission.    Bed side visit made, Jack Rivas was resting comfortably with his eyes closed. Currently receiving tube feeds and on IV antibiotics. Discussed plan with his nurse, Franklyn Lor relayed that Jack Rivas does not speak, and has poor mentation; and the plan is still to discharge on Monday.   Patient remains inpatient appropriate due to Ongoing aspiration pneumonia-on IV antibiotics   V/S: T-97; BP 139/69; HR 63; RR 14; 98% RA   I/O:   UOP: 1400    Abnormal Labs:  No new labs     Diagnostics: none    IV/PRN:  Unasyn 3g in sodium chloride 0.9% IVPB every 8 hours Ativan IV 0.5mg  every 6 hours PRN    Assessment/Plan: Sepsis due to aspiration PNA: Sepsis physiology resolved-continue Unasyn x5 days total.  Unfortunately given his dementia/dysphagia-he will remain at risk for recurrent aspiration episodes and decompensation.  Continue to maintain aspiration precautions.     Acute metabolic encephalopathy: Due to aspiration PNA-suspect he is now back to baseline.   AKI: Hemodynamically mediated-resolved.   Acute sinusitis: Seen on CT imaging-on appropriate antibiotics.   Coag negative staph bacteremia: No indication for treatment-likely a contamination.   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  Per chart review-[name] family wants to continue full scope of treatment.  Continue supportive care-PEG tube feeds.   Sinus bradycardia/episodic junctional bradycardia: Evaluated by cardiology-no indication for PPM or cardiac interventions-suspect that even if he were to have a hemodynamically unstable event-he will be a poor candidate for aggressive care.  Continue to avoid AV nodal blocking agents.   Cervical degenerative disc disease: CT C-spine findings reviewed-do not think he would be a candidate for any sort of surgery at this point.  Supportive care for now.   Palliative care: Full code for now-Per review of prior notes-family wanted to continue full scope of treatment.  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  Will await further recommendations from palliative care team-they are in the process of having ongoing goals of care discussion with the patient's daughter.   Nutrition Status: Nutrition Problem: Moderate Malnutrition Etiology: chronic illness Signs/Symptoms: mild fat depletion, mild muscle depletion Interventions: MVI, Tube feeding   BMI: Estimated body mass index is 24.22 kg/m as calculated from the following:   Height as of this encounter: 6\' 3"  (1.905 m).   Weight as of this encounter: 87.9 kg.    Code status:   Code Status: Full Code    DVT Prophylaxis: enoxaparin (LOVENOX) injection 40 mg Start: 03/01/22 2200   Family Communication: Daughter-[name] updated over the phone on 8/20     Disposition Plan: Status is: Inpatient Remains inpatient appropriate because: Ongoing  aspiration pneumonia-on IV  antibiotics-discharge planned this coming Monday.   Planned Discharge Destination: Hospice care    Discharge Planning: ongoing, plan is to discharge home on Monday with daughter Jack Rivas.    Family Contact: not present at bedside. Family updated.   IDT: Updated   Goals of Care: plan is to return home with family on Monday to continue with hospice. DME delivery has been arranged.  Spoke with [employer], scheduled for early Monday morning. Family updated.   Please do not hesitate to call with any hospice related questions or concerns.    At discharge, please use [facility] for transport, they contract this service for our active hospice patients.  Renae Fickle MSN, RN [facility] Liaison  [phone]

## 2022-03-05 NOTE — Evaluation (Signed)
Physical Therapy Evaluation Patient Details Name: Jack Rivas MRN: [id number] DOB: 1945/11/12 Today's Date: 03/05/2022  History of Present Illness  Jack Rivas is a 76 y.o. male presenting with ? aspiration PNA, septic shock, and bradycardia.  PMH of dementia, HTN, and aspiration PNA with feeding tube.  He was admitted from SNF.  Clinical Impression   Pt admitted for concerns listed above. Unsure of patients PLOF. At this time, pt is requiring Total A +2 for all ADL's. Family is taking pt home on hospice care. Recommending 24 hour care, hospital bed and hoyer lift if pt's family wants to use them to assist with care. Pt has no further skilled PT needs and acute PT will sign off.       Recommendations for follow up therapy are one component of a multi-disciplinary discharge planning process, led by the attending physician.  Recommendations may be updated based on patient status, additional functional criteria and insurance authorization.  Follow Up Recommendations No PT follow up Can patient physically be transported by private vehicle: No    Assistance Recommended at Discharge Frequent or constant Supervision/Assistance  Patient can return home with the following  Two people to help with walking and/or transfers;Two people to help with bathing/dressing/bathroom;Assistance with cooking/housework;Direct supervision/assist for medications management;Direct supervision/assist for financial management;Assist for transportation;Help with stairs or ramp for entrance    Equipment Recommendations Hospital bed;Wheelchair (measurements PT);Wheelchair cushion (measurements PT) (mechanical lift/hoyer lift as long as getting OOB and in the chair is comfortable/palliative for pt, and especially if family would like one; Rec non-emergent ambulance transport home)  Recommendations for Other Services       Functional Status Assessment Patient has had a recent decline in their functional status and/or  demonstrates limited ability to make significant improvements in function in a reasonable and predictable amount of time     Precautions / Restrictions Precautions Precautions: Fall Precaution Comments: Dysphagia 1; Nectar thick Restrictions Weight Bearing Restrictions: No      Mobility  Bed Mobility Overal bed mobility: Needs Assistance Bed Mobility: Rolling           General bed mobility comments: Total assist to roll for lift pad placement    Transfers Overall transfer level: Needs assistance Equipment used: Ambulation equipment used Transfers: Bed to chair/wheelchair/BSC             General transfer comment: Total A +2 Transfer via Lift Equipment: Maximove  Ambulation/Gait                  Stairs            Wheelchair Mobility    Modified Rankin (Stroke Patients Only)       Balance                                             Pertinent Vitals/Pain Pain Assessment Pain Assessment: Faces Faces Pain Scale: Hurts even more Pain Location: Generalized with movement Pain Descriptors / Indicators: Aching, Grimacing, Moaning Pain Intervention(s): Monitored during session    Home Living Family/patient expects to be discharged to:: Private residence Living Arrangements: Children Available Help at Discharge: Family;Other (Comment) (and Hospice Services)               Additional Comments: Home on Hospice with Family support    Prior Function Prior Level of Function : Needs assist  Mobility Comments: Total A ADLs Comments: Total A     Hand Dominance        Extremity/Trunk Assessment   Upper Extremity Assessment Upper Extremity Assessment: Defer to OT evaluation    Lower Extremity Assessment Lower Extremity Assessment:  (Noting muscle guarding/resistance to attempts at PROM)    Cervical / Trunk Assessment Cervical / Trunk Assessment: Kyphotic  Communication   Communication: Expressive  difficulties  Cognition Arousal/Alertness: Lethargic Behavior During Therapy: Flat affect Overall Cognitive Status: History of cognitive impairments - at baseline                                 General Comments: Advanced Dementia        General Comments General comments (skin integrity, edema, etc.): Pt kept mouth open for 90% of session, was able to close with stimulation for feeding.    Exercises     Assessment/Plan    PT Assessment Patient does not need any further PT services;All further PT needs can be met in the next venue of care  PT Problem List Decreased strength;Decreased balance;Decreased activity tolerance;Decreased coordination;Decreased mobility;Decreased cognition;Decreased knowledge of use of DME;Decreased knowledge of precautions;Decreased safety awareness       PT Treatment Interventions      PT Goals (Current goals can be found in the Care Plan section)  Acute Rehab PT Goals Patient Stated Goal: Unable to state PT Goal Formulation: All assessment and education complete, DC therapy    Frequency       Co-evaluation PT/OT/SLP Co-Evaluation/Treatment: Yes Reason for Co-Treatment: For patient/therapist safety;Complexity of the patient's impairments (multi-system involvement) PT goals addressed during session: Mobility/safety with mobility         AM-PAC PT "6 Clicks" Mobility  Outcome Measure Help needed turning from your back to your side while in a flat bed without using bedrails?: Total Help needed moving from lying on your back to sitting on the side of a flat bed without using bedrails?: Total Help needed moving to and from a bed to a chair (including a wheelchair)?: Total Help needed standing up from a chair using your arms (e.g., wheelchair or bedside chair)?: Total Help needed to walk in hospital room?: Total Help needed climbing 3-5 steps with a railing? : Total 6 Click Score: 6    End of Session Equipment Utilized During  Treatment: Other (comment) (Maximove) Activity Tolerance: Patient tolerated treatment well Patient left: in chair;with call bell/phone within reach;with chair alarm set;with restraints reapplied Nurse Communication: Mobility status PT Visit Diagnosis: Unsteadiness on feet (R26.81);Muscle weakness (generalized) (M62.81)    Time: 2111-7356 PT Time Calculation (min) (ACUTE ONLY): 32 min   Charges:     PT Treatments $Therapeutic Activity: 23-37 mins        Roney Marion, PT  Acute Rehabilitation Services Office [phone]   Colletta Maryland 03/05/2022, 5:53 PM

## 2022-03-05 NOTE — Evaluation (Signed)
Occupational Therapy Evaluation Patient Details Name: Jack Rivas MRN: [id number] DOB: 28-Apr-1946 Today's Date: 03/05/2022   History of Present Illness Jack Rivas is a 76 y.o. male presenting with ? aspiration PNA, septic shock, and bradycardia.  PMH of dementia, HTN, and aspiration PNA with feeding tube.  He was admitted from SNF.   Clinical Impression   Pt admitted for concerns listed above. Unsure of patients PLOF. At this time, pt is requiring Total A +2 for all ADL's. Family is taking pt home on hospice care. Recommending hospital bed and hoyer lift if pt's family wants to use them to assist with care. Pt has no further skilled OT needs and acute OT will sign off.       Recommendations for follow up therapy are one component of a multi-disciplinary discharge planning process, led by the attending physician.  Recommendations may be updated based on patient status, additional functional criteria and insurance authorization.   Follow Up Recommendations  No OT follow up    Assistance Recommended at Discharge Frequent or constant Supervision/Assistance  Patient can return home with the following Two people to help with walking and/or transfers;Two people to help with bathing/dressing/bathroom;Assistance with cooking/housework;Assistance with feeding;Direct supervision/assist for medications management;Direct supervision/assist for financial management    Functional Status Assessment  Patient has had a recent decline in their functional status and/or demonstrates limited ability to make significant improvements in function in a reasonable and predictable amount of time  Equipment Recommendations  Hospital bed;Other (comment) (hoyer lift)    Recommendations for Other Services       Precautions / Restrictions Precautions Precautions: Fall Restrictions Weight Bearing Restrictions: No      Mobility Bed Mobility               General bed mobility comments: Up in lift with PT  and rehab tech    Transfers Overall transfer level: Needs assistance Equipment used: Ambulation equipment used Transfers: Bed to chair/wheelchair/BSC             General transfer comment: Total A +2 Transfer via Lift Equipment: Maximove    Balance                                           ADL either performed or assessed with clinical judgement   ADL Overall ADL's : Needs assistance/impaired                                       General ADL Comments: Total A +2 for all care     Vision   Additional Comments: unable to assessdue to cognition. Kept eyes closed for most of session     Perception     Praxis      Pertinent Vitals/Pain Pain Assessment Pain Assessment: Faces Faces Pain Scale: Hurts even more Pain Location: Generalized with movement Pain Descriptors / Indicators: Aching, Grimacing, Moaning Pain Intervention(s): Monitored during session, Repositioned, Limited activity within patient's tolerance     Hand Dominance     Extremity/Trunk Assessment Upper Extremity Assessment Upper Extremity Assessment: RUE deficits/detail;LUE deficits/detail RUE Deficits / Details: Highly resistive, unable to assess LUE Deficits / Details: Highly resisteive, unable to assess   Lower Extremity Assessment Lower Extremity Assessment: Defer to PT evaluation   Cervical / Trunk Assessment Cervical / Trunk Assessment:  Kyphotic   Communication Communication Communication: Expressive difficulties   Cognition Arousal/Alertness: Lethargic Behavior During Therapy: Flat affect Overall Cognitive Status: History of cognitive impairments - at baseline                                 General Comments: Advanced Dementia     General Comments  Pt kept mouth open for 90% of session, was able to close with stimulation for feeding.    Exercises     Shoulder Instructions      Home Living Family/patient expects to be discharged  to:: Private residence                                 Additional Comments: Home on Hospice with Family support      Prior Functioning/Environment Prior Level of Function : Needs assist             Mobility Comments: Total A ADLs Comments: Total A        OT Problem List: Decreased strength;Decreased range of motion;Decreased activity tolerance;Impaired balance (sitting and/or standing);Impaired vision/perception;Decreased cognition;Decreased safety awareness;Cardiopulmonary status limiting activity;Impaired sensation;Impaired UE functional use;Pain      OT Treatment/Interventions:      OT Goals(Current goals can be found in the care plan section) Acute Rehab OT Goals Patient Stated Goal: none stated OT Goal Formulation: Patient unable to participate in goal setting Time For Goal Achievement: 03/05/22 Potential to Achieve Goals: Poor  OT Frequency:      Co-evaluation              AM-PAC OT "6 Clicks" Daily Activity     Outcome Measure Help from another person eating meals?: Total Help from another person taking care of personal grooming?: Total Help from another person toileting, which includes using toliet, bedpan, or urinal?: Total Help from another person bathing (including washing, rinsing, drying)?: Total Help from another person to put on and taking off regular upper body clothing?: Total Help from another person to put on and taking off regular lower body clothing?: Total 6 Click Score: 6   End of Session Equipment Utilized During Treatment: Other (comment) [employer]) Nurse Communication: Mobility status;Need for lift equipment  Activity Tolerance: Patient tolerated treatment well Patient left: in chair;with call bell/phone within reach;with chair alarm set  OT Visit Diagnosis: Adult, failure to thrive (R62.7);Other symptoms and signs involving cognitive function;Other abnormalities of gait and mobility (R26.89);Muscle weakness (generalized)  (M62.81)                Time: 3646-8032 OT Time Calculation (min): 31 min Charges:  OT General Charges $OT Visit: 1 Visit OT Evaluation $OT Eval Moderate Complexity: 1 Mod OT Treatments $Self Care/Home Management : 8-22 mins  Sajan Cheatwood H., OTR/L Acute Rehabilitation  Lanett Lasorsa Elane Eldean Klatt 03/05/2022, 2:13 PM

## 2022-03-05 NOTE — TOC Progression Note (Addendum)
Transition of Care [facility]) - Progression Note    Patient Details  Name: Jack Rivas MRN: [id number] Date of Birth: 1946-04-27  Transition of Care [facility]) CM/SW Contact  Lawerance Sabal, RN Phone Number: 03/05/2022, 12:26 PM  Clinical Narrative:    Sherron Monday w [facility] liaison to confirm that DME has been delivered to the home.  [facility] waiting to hear back and will update.    Called daughter [name]. She states that she spoke to the DME company today and was told that they don't deliver on Sunday and that they will deliver it Monday. I informed [name] that if it is delivered in the morning we may still plan to DC Monday afternoon. She was agreeable   Spoke w [facility] to confirm above, as hospice patients can usually get DME delivered on the weekend. Delivery driver is going to call [name] to set up delivery for today. I called [name] back and let her know they will call and get things delivered today. She was agreeable   Update- [facility] states that DME will be delivered first thing Monday AM   Tarpley,[name] (Daughter)  [phone]      Barriers to Discharge: Equipment Delay  Expected Discharge Plan and Services                                                 Social Determinants of Health (SDOH) Interventions    Readmission Risk Interventions    03/28/2021    3:34 PM  Readmission Risk Prevention Plan  Transportation Screening Complete  PCP or Specialist Appt within 3-5 Days Complete  HRI or Home Care Consult Complete  Social Work Consult for Recovery Care Planning/Counseling Complete  Palliative Care Screening Not Applicable  Medication Review Oceanographer) Complete

## 2022-03-05 NOTE — Progress Notes (Signed)
Daily Progress Note   Patient Name: Jack Rivas       Date: 03/05/2022 DOB: 05-Jan-1946  Age: 76 y.o. MRN#: [id number] Attending Physician: Maretta Bees, MD Primary Care Physician: Patient, No Pcp Per Admit Date: 03/01/2022  Reason for Consultation/Follow-up: Establishing goals of care  Subjective: Medical records reviewed including progress notes, labs. Patient assessed at the bedside. No family present during my visit.  Called patient's daughter [name] to discuss goals of care.  She confirms plans for patient to go home with hospice tomorrow.  I explored her thoughts on his poor prognosis and CODE STATUS.  She tells me that she did not even discuss this with her sister and that they will need to continue talking about it.  She does not want to make any changes for now.  Questions and concerns addressed. PMT will continue to support holistically.   Length of Stay: 4   Physical Exam Vitals and nursing note reviewed.  Constitutional:      General: He is not in acute distress.    Appearance: He is ill-appearing.  Cardiovascular:     Rate and Rhythm: Bradycardia present.  Pulmonary:     Effort: Pulmonary effort is normal. No respiratory distress.  Neurological:     Mental Status: He is lethargic.             Vital Signs: BP 139/69 (BP Location: Left Arm)   Pulse 63   Temp (!) 97 F (36.1 C) (Axillary)   Resp 14   Ht 6\' 3"  (1.905 m)   Wt 87.9 kg   SpO2 98%   BMI 24.22 kg/m  SpO2: SpO2: 98 % O2 Device: O2 Device: Room Air O2 Flow Rate:        Palliative Assessment/Data: 30% (on tube feeds)     Palliative Care Assessment & Plan   Patient Profile: 76 y.o. male  with past medical history of dementia, HTN, and aspiration PNA with feeding tube admitted on 03/01/2022  with altered mental status from SNF.    Patient is enrolled in hospice and has presented to the ED twice in the past 6 months for PEG tube replacement after he has pulled it out.  He also presented similarly in April when he was hospitalized.  Currently admitted for septic shock likely due to aspiration pneumonia and  AKI on CKD 3.  PMT has been consulted to assist with goals of care conversation.  Assessment: Goals of care conversation Advanced dementia Sinus bradycardia AKI on CKD3 Septic shock due to possible aspiration pneumonia Acute metabolic encephalopathy  Recommendations/Plan: Continue full code, daughter is not ready for DNR Continue full scope treatment Goal remains to discharge home with hospice tomorrow PMT will continue to follow   Prognosis:  < 6 months  Discharge Planning: Home with Hospice  Care plan was discussed with patient's daughter   MDM high         Dynastee Brummell Jeni Salles, PA-C  Palliative Medicine Team Team phone # [phone]  Thank you for allowing the Palliative Medicine Team to assist in the care of this patient. Please utilize secure chat with additional questions, if there is no response within 30 minutes please call the above phone number.  Palliative Medicine Team providers are available by phone from 7am to 7pm daily and can be reached through the team cell phone.  Should this patient require assistance outside of these hours, please call the patient's attending physician.

## 2022-03-06 ENCOUNTER — Other Ambulatory Visit (HOSPITAL_COMMUNITY): Payer: Self-pay

## 2022-03-06 DIAGNOSIS — J690 Pneumonitis due to inhalation of food and vomit: Secondary | ICD-10-CM | POA: Diagnosis not present

## 2022-03-06 DIAGNOSIS — F0150 Vascular dementia without behavioral disturbance: Secondary | ICD-10-CM

## 2022-03-06 DIAGNOSIS — N189 Chronic kidney disease, unspecified: Secondary | ICD-10-CM

## 2022-03-06 DIAGNOSIS — N179 Acute kidney failure, unspecified: Secondary | ICD-10-CM | POA: Diagnosis not present

## 2022-03-06 DIAGNOSIS — A419 Sepsis, unspecified organism: Secondary | ICD-10-CM | POA: Diagnosis not present

## 2022-03-06 LAB — CULTURE, BLOOD (ROUTINE X 2): Culture: NO GROWTH

## 2022-03-06 LAB — GLUCOSE, CAPILLARY
Glucose-Capillary: 101 mg/dL — ABNORMAL HIGH (ref 70–99)
Glucose-Capillary: 102 mg/dL — ABNORMAL HIGH (ref 70–99)
Glucose-Capillary: 112 mg/dL — ABNORMAL HIGH (ref 70–99)
Glucose-Capillary: 71 mg/dL (ref 70–99)
Glucose-Capillary: 80 mg/dL (ref 70–99)
Glucose-Capillary: 93 mg/dL (ref 70–99)

## 2022-03-06 MED ORDER — PROSOURCE TF20 ENFIT COMPATIBL EN LIQD: 60.0000 mL | Freq: Every day | ENTERAL | 2 refills | Status: DC

## 2022-03-06 MED ORDER — PROSOURCE TF20 ENFIT COMPATIBL EN LIQD
60.0000 mL | Freq: Every day | ENTERAL | 2 refills | Status: DC
  Filled 2022-03-06: qty 1800, 30d supply, fill #0

## 2022-03-06 MED ORDER — FREE WATER
200.0000 mL | 2 refills | Status: AC
Start: 2022-03-06 — End: 2022-06-04
  Filled 2022-03-06: qty 36000, 30d supply, fill #0

## 2022-03-06 MED ORDER — POLYETHYLENE GLYCOL 3350 17 GM/SCOOP PO POWD
17.0000 g | Freq: Every day | ORAL | 0 refills | Status: DC
  Filled 2022-03-06: qty 238, 14d supply, fill #0

## 2022-03-06 MED ORDER — DICLOFENAC SODIUM 1 % EX GEL
2.0000 g | Freq: Four times a day (QID) | CUTANEOUS | 0 refills | Status: DC
  Filled 2022-03-06: qty 200, 13d supply, fill #0

## 2022-03-06 MED ORDER — LORAZEPAM 0.5 MG PO TABS
0.5000 mg | ORAL_TABLET | Freq: Every day | ORAL | 0 refills | Status: DC
  Filled 2022-03-06: qty 30, 30d supply, fill #0

## 2022-03-06 MED ORDER — ATROPINE SULFATE 1 % OP SOLN
4.0000 [drp] | OPHTHALMIC | 2 refills | Status: DC | PRN
  Filled 2022-03-06: qty 2, 2d supply, fill #0

## 2022-03-06 MED ORDER — JEVITY 1.5 CAL/FIBER PO LIQD
1000.0000 mL | ORAL | 2 refills | Status: DC
  Filled 2022-03-06: qty 10000, fill #0

## 2022-03-06 MED ORDER — ACETAMINOPHEN 160 MG/5ML PO SUSP
325.0000 mg | Freq: Four times a day (QID) | ORAL | 0 refills | Status: DC | PRN
  Filled 2022-03-06: qty 118, 3d supply, fill #0

## 2022-03-06 MED ORDER — VALPROIC ACID 250 MG/5ML PO SOLN
250.0000 mg | Freq: Every evening | ORAL | 2 refills | Status: DC
  Filled 2022-03-06: qty 300, 60d supply, fill #0

## 2022-03-06 NOTE — TOC Transition Note (Signed)
Transition of Care [facility]) - CM/SW Discharge Note   Patient Details  Name: Lido Maske MRN: [id number] Date of Birth: September 19, 1945  Transition of Care [facility]) CM/SW Contact:  Bess Kinds, RN Phone Number: [phone] 03/06/2022, 3:12 PM   Clinical Narrative:     DME has been delivered. Daughter requested patient get home about 5 pm. [facility] arranged for 4:30 pm. No further TOC needs identified.   Final next level of care: Home w Hospice Care Barriers to Discharge: No Barriers Identified   Patient Goals and CMS Choice Patient states their goals for this hospitalization and ongoing recovery are:: "I don't know"      Discharge Placement                       Discharge Plan and Services                                     Social Determinants of Health (SDOH) Interventions     Readmission Risk Interventions    03/28/2021    3:34 PM  Readmission Risk Prevention Plan  Transportation Screening Complete  PCP or Specialist Appt within 3-5 Days Complete  HRI or Home Care Consult Complete  Social Work Consult for Recovery Care Planning/Counseling Complete  Palliative Care Screening Not Applicable  Medication Review Oceanographer) Complete

## 2022-03-06 NOTE — Progress Notes (Signed)
[facility] transport arranged for 4:30 pm. Medical paperwork completed and printed to 5[facility] nursing station.   03/06/22 1505  Medical Necessity for Transport Certificate --- IF THIS TRANSPORT IS ROUND TRIP OR SCHEDULED AND REPEATED, A PHYSICIAN MUST COMPLETE THIS FORM  Transport from: (Location) [facility] (SSN [id number])  Transport to (Location) Other (Comment) ([address], [place], [place])  Did the patient arrive from a Skilled Nursing Facility, Assisted Living Facility or Group Home? Yes  Care Facility Name [facility]  Is this the closest appropriate facility? Yes  Date of Transport Service 03/06/22  Name of Transporting Agency [facility]  Round Trip Transport? No  Reason for Transport Discharge  Is this a hospice patient? Yes  Is this transport related to the patient's terminal illness? Yes  Please descirbe septic shock, aspiration pneumonia, ckd, nonambulatory, ams  Describe the Medical Condition septic shock, aspiration pneumonia, ckd, nonambulatory, ams  Q1 Are ALL the following "true"? 1. Patient unable to get up from bed without assistance  AND  2. Unable to ambulate  AND  3. Unable to sit in a chair, including wheelchair. Yes  Q2 Could the patient be transported safely by other means of transportation (I.E., wheelchair van)? No  Q3 Please check any of the following conditions that apply at the time of transport: Risk of injury to self and/or others;Altered mental status  Electronic Signature Hortencia Conradi [facility]  Credentials DP  Date Signed 03/06/22  Print Form Print

## 2022-03-06 NOTE — TOC Progression Note (Signed)
Transition of Care [facility]) - Progression Note    Patient Details  Name: Miko Sirico MRN: [id number] Date of Birth: 1945/11/19  Transition of Care [facility]) CM/SW Contact  Bess Kinds, RN Phone Number: [phone] 03/06/2022, 1:02 PM  Clinical Narrative:     Spoke with patient's daughter, [name], on the phone. [name] asked about getting a referral from hospital for her to be patient's live in caregiver. Advised that the hospital did not have a referral process for this. Encouraged her to follow up with hospice social worker to find out about how to access social service benefits. DME has not yet been delivered. TOC following for transition needs.     Barriers to Discharge: Equipment Delay  Expected Discharge Plan and Services           Expected Discharge Date: 03/06/22                                     Social Determinants of Health (SDOH) Interventions    Readmission Risk Interventions    03/28/2021    3:34 PM  Readmission Risk Prevention Plan  Transportation Screening Complete  PCP or Specialist Appt within 3-5 Days Complete  HRI or Home Care Consult Complete  Social Work Consult for Recovery Care Planning/Counseling Complete  Palliative Care Screening Not Applicable  Medication Review Oceanographer) Complete

## 2022-03-06 NOTE — TOC Progression Note (Signed)
Transition of Care [facility]) - Progression Note    Patient Details  Name: Jack Rivas MRN: [id number] Date of Birth: 06-25-1946  Transition of Care [facility]) CM/SW Contact  Jack Kinds, RN Phone Number: [phone] 03/06/2022, 10:00 AM  Clinical Narrative:     Spoke with patient's daughter, Jack Rivas, on her cell phone [phone] to discuss post acute transition. Jack Rivas is waiting for DME delivery today, but has not heard from DME provider today concerning time of delivery. Jack Rivas to call RNCM when DME is delivered. Jack Rivas verbalized understanding.   Jack Rivas asked about resources for assistance in the home. Discussed that services would be private. Jack Rivas asked about getting paid to provide his care. Medicaid not identified in chart. Jack Rivas stated she is planning to go to social services today and will find out what benefits he has.   Communicated with [facility] - patient is ready to transition home when DME is delivered. Will arrange transport through EMS. Patient will need script for all medications to be taken. Provider sent meds to [facility] pharmacy for delivery to the room.     Barriers to Discharge: Equipment Delay  Expected Discharge Plan and Services           Expected Discharge Date: 03/06/22                                     Social Determinants of Health (SDOH) Interventions    Readmission Risk Interventions    03/28/2021    3:34 PM  Readmission Risk Prevention Plan  Transportation Screening Complete  PCP or Specialist Appt within 3-5 Days Complete  HRI or Home Care Consult Complete  Social Work Consult for Recovery Care Planning/Counseling Complete  Palliative Care Screening Not Applicable  Medication Review Oceanographer) Complete

## 2022-03-06 NOTE — TOC Progression Note (Signed)
Transition of Care [facility]) - Progression Note    Patient Details  Name: Stepehn Eckard MRN: [id number] Date of Birth: 22-Sep-1945  Transition of Care [facility]) CM/SW Contact  Bess Kinds, RN Phone Number: [phone] 03/06/2022, 4:37 PM  Clinical Narrative:     Received call from [name] saying that she has too much to do to get ready for her dad to come home. She does not know how to use the equipment and does not have the TF or tubing. She is asking that patient come home in the morning. MD, nursing, and hospice team notified. Requested TF scripts be sent to [facility] since TOC does not fill these type of scripts. Nursing to send a couple TF tubings home. EMS rescheduled for tomorrow AM. Spoke with [name] to advise of plan and that patient will need to transition home in the moring. [name] verbalized understanding and appreciation stating that she is has been feeling very overwhelmed today.     Barriers to Discharge: No Barriers Identified  Expected Discharge Plan and Services           Expected Discharge Date: 03/06/22                                     Social Determinants of Health (SDOH) Interventions    Readmission Risk Interventions    03/28/2021    3:34 PM  Readmission Risk Prevention Plan  Transportation Screening Complete  PCP or Specialist Appt within 3-5 Days Complete  HRI or Home Care Consult Complete  Social Work Consult for Recovery Care Planning/Counseling Complete  Palliative Care Screening Not Applicable  Medication Review Oceanographer) Complete

## 2022-03-06 NOTE — Progress Notes (Signed)
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  Admitted to [facility] on 03/01/2022 with a diagnosis of Septic Shock due to undetermined organism and aspiration pneumonia.  Per Dr. with [facility], this is a related hospital admission.   Visited Jack Rivas at bedside. Spoke with patient's daughter via telephone to provide update and inquire about DME delivery. Patient is alert with aphasia present, sitting up in bed, restraints present, catheter draining clear, yellow urine. Currently receiving tube feeding during visit. Report exchanged with hospital team. DME was delivered to patient's home today in anticipation for discharge today. Discharge has been delayed and now the plan is to discharge tomorrow morning (Tuesday 8.22). Will follow up tomorrow.    Patient remains inpatient appropriate due to ongoing aspiration pneumonia on IV antibiotic.    V/S: 147/78, 97.6 Temp, 58bpm, 15 RR, 100% on RA  I&O: 1599.03/2549  Labs: None today   Diagnostics: None today  IV/PRN:  Ampicillin-Sulbactam (UNASYN) 3 g in sodium chloride 0.9 % 100 mL IVPB - q8hrs, IV  LORazepam (ATIVAN) injection 0.5 mg - q6hrs, PRN, IV x1   Problem list: No new updates today, was anticipated to discharge today, but was delayed until tomorrow.    GOC: Patient is currently a Full Code   D/C planning: Ongoing, plan is to discharge tomorrow morning, Tuesday 8.22.   Family: Spoke with patient's daughter via telephone. She has expressed some apprehension with tube feedings at home and is overwhelmed. Will follow up with her in the morning.    IDT: Updated   Medication list and Transfer Summary placed on Shadow Chart.   Should patient need ambulance transfer - Please use [facility]) as  they contract this service for our active hospice patients.        Lamount Cranker, RN [place] Liaison  [phone]

## 2022-03-06 NOTE — Discharge Summary (Signed)
PATIENT DETAILS Name: Jack Rivas Age: 76 y.o. Sex: male Date of Birth: [birth year] MRN: [id number]. Admitting Physician: Karmen Bongo, MD [id number], No Pcp Per  Admit Date: 03/01/2022 Discharge date: 03/06/2022  Recommendations for Outpatient Follow-up:  Follow up with PCP in 1-2 weeks Please continue to have palliative care follow-up patient in the outpatient setting.  Very poor overall prognosis-will be at continued risk of recurrent aspiration episodes/decompensation/recurrent hospitalizations. Avoid AV nodal agents-history of sinus bradycardia/occasional junctional rhythm.  Admitted From:  SNF  Disposition: Home with hospice.   Discharge Condition: poor  CODE STATUS:   Code Status: Full Code   Diet recommendation:  Diet Order             DIET - DYS 1 Room service appropriate? Yes; Fluid consistency: Nectar Thick  Diet effective now                    Brief Summary: Patient is a 76 y.o.  male advanced dementia-with bedbound status-severe dysphagia s/p PEG tube placement-who presented from SNF for worsening mental status-was found to have sepsis due to aspiration PNA causing acute metabolic encephalopathy.  See below for further details.    Significant events: 8/16>> admit to [facility]-from SNF-sepsis-aspiration pneumonia-encephalopathy   Significant studies: 8/16>> CXR: No active disease. 8/16>> CT head: No acute intracranial abnormality.  Probable acute right maxillary/sphenoid sinusitis noted. 8/16>> CT C-spine: No fracture-increased destruction of the adjacent articular process of left-sided C2-C3 facet joint-most consistent with worsening degenerative joint disease.     Significant microbiology data: 8/16>> COVID/influenza PCR: Negative 8/16>> blood culture: 1/2-Staphylococcus capitis-likely a contamination. 8/16>> urine culture: No growth   Procedures: None   Consults: Palliative care/cardiology  Brief Hospital Course: Sepsis due to aspiration  PNA: Sepsis physiology resolved-continue Unasyn x5 days total.  Unfortunately given his dementia/dysphagia-he will remain at risk for recurrent aspiration episodes and decompensation.  Does not require any further antimicrobial therapy on discharge.   Acute metabolic encephalopathy: Due to aspiration PNA-suspect he is now back to baseline.   AKI: Hemodynamically mediated-resolved.   Acute sinusitis: Seen on CT imaging-on appropriate antibiotics.   Coag negative staph bacteremia: No indication for treatment-likely a contamination.   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  Per chart review-family wants to continue full scope of treatment.  Continue supportive care-PEG tube feeds.   Sinus bradycardia/episodic junctional bradycardia: Evaluated by cardiology-no indication for PPM or cardiac interventions-suspect that even if he were to have a hemodynamically unstable event-he will be a poor candidate for aggressive care.  Continue to avoid AV nodal blocking agents.   Cervical degenerative disc disease: CT C-spine findings reviewed-do not think he would be a candidate for any sort of surgery at this point.  My ask with supportive care-going forward-can be reassessed by PCP in the outpatient setting.     Palliative care: Full code for now-Per review of prior notes-family wanted to continue full scope of treatment.  Unfortunately-he has advanced dementia-with dysphagia requiring PEG tube placement-with severe failure to thrive syndrome-he apparently is bedbound and dependent on others for all activities of daily living-his overall prognosis is poor-he will likely continue to have episodes of recurrent aspiration pneumonia-and will remain at risk for significant morbidity and  mortality.  Plans are to discharge home with hospice follow-up-and continue goals of care discussion in the outpatient setting.   Nutrition  Status: Nutrition Problem: Moderate Malnutrition Etiology: chronic illness Signs/Symptoms: mild fat depletion, mild muscle depletion Interventions: MVI, Tube feeding   BMI: Estimated body mass index is 24.22 kg/m as calculated from the following:   Height as of this encounter: 6\' 3"  (1.905 m).   Weight as of this encounter: 87.9 kg.    Discharge Diagnoses:  Principal Problem:   Septic shock due to undetermined organism [facility]) Active Problems:   Aspiration pneumonia (HCC)   Dementia ([place])   Dysphagia   Chronic kidney disease, stage 3a (HCC)   Bradycardia   Altered mental status   Hypothermia   Acute kidney injury superimposed on chronic kidney disease [facility])   Discharge Instructions:  Activity:  As tolerated  Discharge Instructions     Call MD for:  difficulty breathing, headache or visual disturbances   Complete by: As directed    Call MD for:  persistant nausea and vomiting   Complete by: As directed    Call MD for:  temperature >100.4   Complete by: As directed    Discharge instructions   Complete by: As directed    Follow with Primary MD  in 1-2 weeks  Please get a complete blood count and chemistry panel checked by your Primary MD at your next visit, and again as instructed by your Primary MD.  Get Medicines reviewed and adjusted: Please take all your medications with you for your next visit with your Primary MD  Laboratory/radiological data: Please request your Primary MD to go over all hospital tests and procedure/radiological results at the follow up, please ask your Primary MD to get all Hospital records sent to his/her office.  In some cases, they will be blood work, cultures and biopsy results pending at the time of your discharge. Please request that your primary care M.D. follows up on these results.  Also Note the  following: If you experience worsening of your admission symptoms, develop shortness of breath, life threatening emergency, suicidal or homicidal thoughts you must seek medical attention immediately by calling 911 or calling your MD immediately  if symptoms less severe.  You must read complete instructions/literature along with all the possible adverse reactions/side effects for all the Medicines you take and that have been prescribed to you. Take any new Medicines after you have completely understood and accpet all the possible adverse reactions/side effects.   Do not drive when taking Pain medications or sleeping medications (Benzodaizepines)  Do not take more than prescribed Pain, Sleep and Anxiety Medications. It is not advisable to combine anxiety,sleep and pain medications without talking with your primary care practitioner  Special Instructions: If you have smoked or chewed Tobacco  in the last 2 yrs please stop smoking, stop any regular Alcohol  and or any Recreational drug use.  Wear Seat belts while driving.  Please note: You were cared for by a hospitalist during your hospital stay. Once you are discharged, your primary care physician will handle any further medical issues. Please note that NO REFILLS for any discharge medications will be authorized once you are discharged, as it is imperative that you return to your primary care physician (or establish a relationship with a primary care physician if you do not have one) for your post hospital discharge needs so that they can reassess your need for medications and monitor your lab values.   Increase activity slowly   Complete by: As directed       Allergies as of 03/06/2022   No Known Allergies  Medication List     STOP taking these medications    Benefiber Powd   famotidine 20 MG tablet Commonly known as: PEPCID   QUEtiapine 25 MG tablet Commonly known as: SEROQUEL       TAKE these medications     acetaminophen 160 MG/5ML solution Commonly known as: TYLENOL Place 10.2 mLs (325 mg total) into feeding tube every 6 (six) hours as needed for mild pain, headache or fever.   atropine 1 % ophthalmic solution Place 4 drops under the tongue every 4 (four) hours as needed (Excessive secretions).   diclofenac Sodium 1 % Gel Commonly known as: Voltaren Apply 2 g topically 4 (four) times daily.   feeding supplement (JEVITY 1.5 CAL/FIBER) Liqd Place 1,000 mLs into feeding tube continuous. At 55 ml/hr What changed: additional instructions   feeding supplement (PROSource TF20) liquid Place 60 mLs into feeding tube daily.   free water Soln Place 200 mLs into feeding tube every 4 (four) hours.   LORazepam 0.5 MG tablet Commonly known as: ATIVAN Place 1 tablet (0.5 mg total) into feeding tube daily. What changed: how to take this   polyethylene glycol 17 g packet Commonly known as: MIRALAX / GLYCOLAX Place 17 g into feeding tube daily.   valproic acid 250 MG/5ML solution Commonly known as: DEPAKENE Place 5 mLs (250 mg total) into feeding tube every evening. What changed: how to take this        Follow-up Information     Primary Care MD. Schedule an appointment as soon as possible for a visit in 2 week(s).                 No Known Allergies   Other Procedures/Studies: DG Swallowing Func-Speech Pathology  Result Date: 03/03/2022 Table formatting from the original result was not included. Objective Swallowing Evaluation: Type of Study: MBS-Modified Barium Swallow Study  Patient Details Name: Bobbyjoe Leetch MRN: [id number] Date of Birth: 1946/06/19 Today's Date: 03/03/2022 Time: SLP Start Time (ACUTE ONLY): 42 -SLP Stop Time (ACUTE ONLY): 1306 SLP Time Calculation (min) (ACUTE ONLY): 28 min Past Medical History: Past Medical History: Diagnosis Date  Dementia ([place])   Hypertension   Uses feeding tube  Past Surgical History: Past Surgical History: Procedure Laterality Date   CATARACT EXTRACTION, BILATERAL    KNEE SURGERY   HPI: Pt is a 76 y/o male who presented with AMS. He was admitted with sepsis and suspected aspiration pneumonia. CXR on admission negative for active disease. PMH: dementia, HTN, dysphagia, aspiration PNA, G-tube, pt currently on hospice. MBS 10/27/21: moderate oropharyngeal dysphagia with Impaired oral control. intermittent oral retention, a pharyngeal delay, penetration (thin and nectar via straw) and aspiration. A dysphagia 1 diet with nectar thick liquids via cup was recommended.  Subjective: pt awake in bed, dysarthric  Recommendations for follow up therapy are one component of a multi-disciplinary discharge planning process, led by the attending physician.  Recommendations may be updated based on patient status, additional functional criteria and insurance authorization. Assessment / Plan / Recommendation   03/03/2022   4:39 PM Clinical Impressions Clinical Impression Despite repositioning, the view of the larynx was partially obscured by the pt's shoulders. Pt presents with oropharyngeal dysphagia characterized by reduced bolus cohesion, a pharyngeal delay, weak lingual manipulation, and reduction in tongue base retraction, hyolaryngeal elevation, anterior laryngeal movement, and epiglottic inversion. He demonstrated lingual residue, vallecular residue, reduced laryngeal closure, and pyriform sinus residue. Penetration (PAS 3,5) and silent aspiration (PAS 8) were noted with thin liquids and  with nectar thick liquids via straw. Pt was inconsistently able to demonstrate prompted coughing which was ineffective in expelling aspirated material. No penetration/aspiration were noted with honey thick liquids, but increased pharyngeal residue was noted with this consistency. Despite multiple attempts, pt was unable to swallow a 75mm barium tablet that was embedded in puree, so it was ultimately removed. Pt's swallow function appears similar to that noted in April. A  dysphagia 1 diet with nectar thick liquids is recommended at this time with strict observance of swallowing precautions including avoidance of straws. SLP will continue to follow pt. SLP Visit Diagnosis Dysphagia, oropharyngeal phase (R13.12) Impact on safety and function Moderate aspiration risk;Mild aspiration risk     03/03/2022   4:39 PM Treatment Recommendations Treatment Recommendations Therapy as outlined in treatment plan below     03/03/2022   4:39 PM Prognosis Prognosis for Safe Diet Advancement Guarded Barriers to Reach Goals Time post onset;Behavior;Severity of deficits;Cognitive deficits   03/03/2022   4:39 PM Diet Recommendations SLP Diet Recommendations Dysphagia 1 (Puree) solids;Nectar thick liquid Liquid Administration via Cup;No straw Medication Administration Crushed with puree Compensations Slow rate;Small sips/bites Postural Changes Seated upright at 90 degrees;Remain semi-upright after after feeds/meals (Comment)     03/03/2022   4:39 PM Other Recommendations Other Recommendations Order thickener from pharmacy;Have oral suction available Follow Up Recommendations Follow physician's recommendations for discharge plan and follow up therapies Assistance recommended at discharge Frequent or constant Supervision/Assistance Functional Status Assessment Patient has had a recent decline in their functional status and/or demonstrates limited ability to make significant improvements in function in a reasonable and predictable amount of time   03/03/2022   4:39 PM Frequency and Duration  Speech Therapy Frequency (ACUTE ONLY) min 1 x/week Treatment Duration 1 week     03/03/2022  12:38 PM Oral Phase Oral Phase Impaired Oral - Honey Cup Decreased bolus cohesion;Premature spillage;Weak lingual manipulation;Lingual/palatal residue Oral - Nectar Cup Decreased bolus cohesion;Premature spillage;Weak lingual manipulation;Lingual/palatal residue Oral - Nectar Straw Decreased bolus cohesion;Premature spillage;Weak  lingual manipulation;Lingual/palatal residue Oral - Thin Cup Decreased bolus cohesion;Premature spillage;Weak lingual manipulation;Lingual/palatal residue Oral - Thin Straw Decreased bolus cohesion;Premature spillage;Weak lingual manipulation;Lingual/palatal residue Oral - Puree Decreased bolus cohesion;Premature spillage;Weak lingual manipulation;Lingual/palatal residue Oral - Mech Soft NT Oral - Regular Weak lingual manipulation;Lingual/palatal residue;Impaired mastication    03/03/2022   4:39 PM Pharyngeal Phase Pharyngeal Material enters airway, remains ABOVE vocal cords then ejected out Pharyngeal Material enters airway, passes BELOW cords without attempt by patient to eject out (silent aspiration);Material enters airway, passes BELOW cords and not ejected out despite cough attempt by patient Pharyngeal Material enters airway, passes BELOW cords without attempt by patient to eject out (silent aspiration) Pharyngeal Material enters airway, passes BELOW cords without attempt by patient to eject out (silent aspiration)    03/03/2022  12:38 PM Cervical Esophageal Phase  Cervical Esophageal Phase [facility] [address] I[address] Hardin Negus, [place], [place] Office number [phone] Horton Marshall 03/03/2022, 4:41 PM                     CT HEAD WO CONTRAST  Result Date: 03/01/2022 CLINICAL DATA:  Altered mental status. EXAM: CT HEAD WITHOUT CONTRAST CT CERVICAL SPINE WITHOUT CONTRAST TECHNIQUE: Multidetector CT imaging of the head and cervical spine was performed following the standard protocol without intravenous contrast. Multiplanar CT image reconstructions of the cervical spine were also generated. RADIATION DOSE REDUCTION: This exam was performed according to the departmental dose-optimization program which includes automated exposure  control, adjustment of the mA and/or kV according to patient size and/or use of iterative reconstruction technique. COMPARISON:  May 10, 2021. FINDINGS: CT HEAD  FINDINGS Brain: No evidence of acute infarction, hemorrhage, hydrocephalus, extra-axial collection or mass lesion/mass effect. Vascular: No hyperdense vessel or unexpected calcification. Skull: Normal. Negative for fracture or focal lesion. Sinuses/Orbits: Complete opacification of right maxillary sinus and right sphenoid sinus consistent with sinusitis. Other: None. CT CERVICAL SPINE FINDINGS Alignment: Mild grade 1 anterolisthesis of C7-T1 is noted secondary to posterior facet joint hypertrophy. Skull base and vertebrae: No acute fracture. There is increased left superior articular process of C3 as well as involving the left-sided articular process of C2, most consistent with significantly worsening degenerative change, although focal infection cannot be excluded. Soft tissues and spinal canal: No prevertebral fluid or swelling. No visible canal hematoma. Disc levels: Severe degenerative disc disease is noted at C3-4, C4-5 C6-7 and C7-T1. Status post surgical interbody fusion of C5-6. Upper chest: Negative. Other: None. IMPRESSION: No acute intracranial abnormality is noted. Probable acute right maxillary and sphenoid sinusitis is noted. No fracture is noted in the cervical spine. Multilevel degenerative changes are noted. There is noted increased destruction of the adjacent articular processes of the left-sided C2-3 facet joint, most consistent with worsening degenerative joint disease, although infection cannot be excluded. MRI may be performed for further evaluation. Electronically Signed   By: Lupita Raider M.D.   On: 03/01/2022 15:06   CT Cervical Spine Wo Contrast  Result Date: 03/01/2022 CLINICAL DATA:  Altered mental status. EXAM: CT HEAD WITHOUT CONTRAST CT CERVICAL SPINE WITHOUT CONTRAST TECHNIQUE: Multidetector CT imaging of the head and cervical spine was performed following the standard protocol without intravenous contrast. Multiplanar CT image reconstructions of the cervical spine were also  generated. RADIATION DOSE REDUCTION: This exam was performed according to the departmental dose-optimization program which includes automated exposure control, adjustment of the mA and/or kV according to patient size and/or use of iterative reconstruction technique. COMPARISON:  May 10, 2021. FINDINGS: CT HEAD FINDINGS Brain: No evidence of acute infarction, hemorrhage, hydrocephalus, extra-axial collection or mass lesion/mass effect. Vascular: No hyperdense vessel or unexpected calcification. Skull: Normal. Negative for fracture or focal lesion. Sinuses/Orbits: Complete opacification of right maxillary sinus and right sphenoid sinus consistent with sinusitis. Other: None. CT CERVICAL SPINE FINDINGS Alignment: Mild grade 1 anterolisthesis of C7-T1 is noted secondary to posterior facet joint hypertrophy. Skull base and vertebrae: No acute fracture. There is increased left superior articular process of C3 as well as involving the left-sided articular process of C2, most consistent with significantly worsening degenerative change, although focal infection cannot be excluded. Soft tissues and spinal canal: No prevertebral fluid or swelling. No visible canal hematoma. Disc levels: Severe degenerative disc disease is noted at C3-4, C4-5 C6-7 and C7-T1. Status post surgical interbody fusion of C5-6. Upper chest: Negative. Other: None. IMPRESSION: No acute intracranial abnormality is noted. Probable acute right maxillary and sphenoid sinusitis is noted. No fracture is noted in the cervical spine. Multilevel degenerative changes are noted. There is noted increased destruction of the adjacent articular processes of the left-sided C2-3 facet joint, most consistent with worsening degenerative joint disease, although infection cannot be excluded. MRI may be performed for further evaluation. Electronically Signed   By: Lupita Raider M.D.   On: 03/01/2022 15:06   DG Chest Portable 1 View  Result Date: 03/01/2022 CLINICAL  DATA:  Weakness EXAM: PORTABLE CHEST 1 VIEW COMPARISON:  Chest x-ray dated October 23, 2021; chest CT dated October 24, 2021 FINDINGS: The heart size and mediastinal contours are within normal limits. Unchanged unchanged calcification overlying the left mid lung, correlate with soft tissue calcification on prior chest CT. Both lungs are clear. The visualized skeletal structures are unremarkable. IMPRESSION: No active disease. Electronically Signed   By: Yetta Glassman M.D.   On: 03/01/2022 13:30     TODAY-DAY OF DISCHARGE:  Subjective:   Jackelyn Poling today remained stable without any major issues overnight.  Objective:   Blood pressure (!) 147/78, pulse (!) 58, temperature 97.6 F (36.4 C), temperature source Axillary, resp. rate 15, height 6\' 3"  (1.905 m), weight 88.4 kg, SpO2 100 %.  Intake/Output Summary (Last 24 hours) at 03/06/2022 0946 Last data filed at 03/06/2022 0600 Gross per 24 hour  Intake 1599.92 ml  Output 2550 ml  Net -950.08 ml   Filed Weights   03/04/22 0500 03/05/22 0500 03/06/22 0437  Weight: 87.1 kg 87.9 kg 88.4 kg    Exam: Not in distress. [place].AT,PERRAL Supple Neck,No JVD, No cervical lymphadenopathy appriciated.  Symmetrical Chest wall movement, Good air movement bilaterally, CTAB RRR,No Gallops,Rubs or new Murmurs, No Parasternal Heave +ve B.Sounds, Abd Soft, Non tender, No organomegaly appriciated, No rebound -guarding or rigidity. No Cyanosis, Clubbing or edema, No new Rash or bruise   PERTINENT RADIOLOGIC STUDIES: No results found.   PERTINENT LAB RESULTS: CBC: Recent Labs    03/04/22 0425  WBC 4.2  HGB 10.3*  HCT 31.3*  PLT 132*   CMET CMP     Component Value Date/Time   NA 142 03/04/2022 0425   K 4.1 03/04/2022 0425   CL 107 03/04/2022 0425   CO2 30 03/04/2022 0425   GLUCOSE 93 03/04/2022 0425   BUN 22 03/04/2022 0425   CREATININE 1.08 03/04/2022 0425   CALCIUM 9.3 03/04/2022 0425   PROT 7.0 03/01/2022 1358   ALBUMIN 3.2 (L)  03/01/2022 1358   AST 16 03/01/2022 1358   ALT 16 03/01/2022 1358   ALKPHOS 63 03/01/2022 1358   BILITOT 0.2 (L) 03/01/2022 1358   GFRNONAA >60 03/04/2022 0425    GFR Estimated Creatinine Clearance: 69.5 mL/min (by C-G formula based on SCr of 1.08 mg/dL). No results for input(s): "LIPASE", "AMYLASE" in the last 72 hours. No results for input(s): "CKTOTAL", "CKMB", "CKMBINDEX", "TROPONINI" in the last 72 hours. Invalid input(s): "POCBNP" No results for input(s): "DDIMER" in the last 72 hours. No results for input(s): "HGBA1C" in the last 72 hours. No results for input(s): "CHOL", "HDL", "LDLCALC", "TRIG", "CHOLHDL", "LDLDIRECT" in the last 72 hours. No results for input(s): "TSH", "T4TOTAL", "T3FREE", "THYROIDAB" in the last 72 hours.  Invalid input(s): "FREET3" No results for input(s): "VITAMINB12", "FOLATE", "FERRITIN", "TIBC", "IRON", "RETICCTPCT" in the last 72 hours. Coags: No results for input(s): "INR" in the last 72 hours.  Invalid input(s): "PT" Microbiology: Recent Results (from the past 240 hour(s))  Resp Panel by RT-PCR (Flu A&B, Covid) Anterior Nasal Swab     Status: None   Collection Time: 03/01/22  1:10 PM   Specimen: Anterior Nasal Swab  Result Value Ref Range Status   SARS Coronavirus 2 by RT PCR NEGATIVE NEGATIVE Final    Comment: (NOTE) SARS-CoV-2 target nucleic acids are NOT DETECTED.  The SARS-CoV-2 RNA is generally detectable in upper respiratory specimens during the acute phase of infection. The lowest concentration of SARS-CoV-2 viral copies this assay can detect is 138 copies/mL. A negative result does not preclude SARS-Cov-2 infection and should not be  used as the sole basis for treatment or other patient management decisions. A negative result may occur with  improper specimen collection/handling, submission of specimen other than nasopharyngeal swab, presence of viral mutation(s) within the areas targeted by this assay, and inadequate number of  viral copies(<138 copies/mL). A negative result must be combined with clinical observations, patient history, and epidemiological information. The expected result is Negative.  Fact Sheet for Patients:  BloggerCourse.com  Fact Sheet for Healthcare Providers:  SeriousBroker.it  This test is no t yet approved or cleared by the [place] FDA and  has been authorized for detection and/or diagnosis of SARS-CoV-2 by FDA under an Emergency Use Authorization (EUA). This EUA will remain  in effect (meaning this test can be used) for the duration of the COVID-19 declaration under Section 564(b)(1) of the Act, 21 U.S.C.section 360bbb-3(b)(1), unless the authorization is terminated  or revoked sooner.       Influenza A by PCR NEGATIVE NEGATIVE Final   Influenza B by PCR NEGATIVE NEGATIVE Final    Comment: (NOTE) The Xpert Xpress SARS-CoV-2/FLU/RSV plus assay is intended as an aid in the diagnosis of influenza from Nasopharyngeal swab specimens and should not be used as a sole basis for treatment. Nasal washings and aspirates are unacceptable for Xpert Xpress SARS-CoV-2/FLU/RSV testing.  Fact Sheet for Patients: BloggerCourse.com  Fact Sheet for Healthcare Providers: SeriousBroker.it  This test is not yet approved or cleared by the [place] FDA and has been authorized for detection and/or diagnosis of SARS-CoV-2 by FDA under an Emergency Use Authorization (EUA). This EUA will remain in effect (meaning this test can be used) for the duration of the COVID-19 declaration under Section 564(b)(1) of the Act, 21 U.S.C. section 360bbb-3(b)(1), unless the authorization is terminated or revoked.  Performed at [facility], [address], [place], [place] [address]   Urine Culture     Status: None   Collection Time: 03/01/22  1:36 PM   Specimen: Urine, Clean Catch  Result Value  Ref Range Status   Specimen Description URINE, CLEAN CATCH  Final   Special Requests NONE  Final   Culture   Final    NO GROWTH Performed at [facility], [address] [place], [place] [address]    Report Status 03/03/2022 FINAL  Final  Blood culture (routine x 2)     Status: None   Collection Time: 03/01/22  1:41 PM   Specimen: BLOOD RIGHT FOREARM  Result Value Ref Range Status   Specimen Description BLOOD RIGHT FOREARM  Final   Special Requests   Final    BOTTLES DRAWN AEROBIC AND ANAEROBIC Blood Culture results may not be optimal due to an inadequate volume of blood received in culture bottles   Culture   Final    NO GROWTH 5 DAYS Performed at [facility], [address], [place], [place] [address]    Report Status 03/06/2022 FINAL  Final  Blood culture (routine x 2)     Status: Abnormal   Collection Time: 03/01/22  5:17 PM   Specimen: BLOOD  Result Value Ref Range Status   Specimen Description BLOOD SITE NOT SPECIFIED  Final   Special Requests   Final    BOTTLES DRAWN AEROBIC AND ANAEROBIC Blood Culture adequate volume   Culture  Setup Time   Final    GRAM POSITIVE COCCI IN CLUSTERS AEROBIC BOTTLE ONLY CRITICAL RESULT CALLED TO, READ BACK BY AND VERIFIED WITH: PHARMD JESSICA MILLEN ON  03/02/22 @ 1712 BY DRT    Culture (A)  Final    STAPHYLOCOCCUS CAPITIS THE SIGNIFICANCE OF ISOLATING THIS ORGANISM FROM A SINGLE SET OF BLOOD CULTURES WHEN MULTIPLE SETS ARE DRAWN IS UNCERTAIN. PLEASE NOTIFY THE MICROBIOLOGY DEPARTMENT WITHIN ONE WEEK IF SPECIATION AND SENSITIVITIES ARE REQUIRED. Performed at [facility], [place] [address], [place], [place] [address]    Report Status 03/03/2022 FINAL  Final  Blood Culture ID Panel (Reflexed)     Status: Abnormal   Collection Time: 03/01/22  5:17 PM  Result Value Ref Range Status   Enterococcus faecalis NOT DETECTED NOT DETECTED Final   Enterococcus Faecium NOT DETECTED NOT DETECTED Final   Listeria monocytogenes NOT  DETECTED NOT DETECTED Final   Staphylococcus species DETECTED (A) NOT DETECTED Final    Comment: CRITICAL RESULT CALLED TO, READ BACK BY AND VERIFIED WITH: PHARMD JESSICA MILLEN ON 03/02/22 @ 1712 BY DRT    Staphylococcus aureus (BCID) NOT DETECTED NOT DETECTED Final   Staphylococcus epidermidis NOT DETECTED NOT DETECTED Final   Staphylococcus lugdunensis NOT DETECTED NOT DETECTED Final   Streptococcus species NOT DETECTED NOT DETECTED Final   Streptococcus agalactiae NOT DETECTED NOT DETECTED Final   Streptococcus pneumoniae NOT DETECTED NOT DETECTED Final   Streptococcus pyogenes NOT DETECTED NOT DETECTED Final   A.calcoaceticus-baumannii NOT DETECTED NOT DETECTED Final   Bacteroides fragilis NOT DETECTED NOT DETECTED Final   Enterobacterales NOT DETECTED NOT DETECTED Final   Enterobacter cloacae complex NOT DETECTED NOT DETECTED Final   Escherichia coli NOT DETECTED NOT DETECTED Final   Klebsiella aerogenes NOT DETECTED NOT DETECTED Final   Klebsiella oxytoca NOT DETECTED NOT DETECTED Final   Klebsiella pneumoniae NOT DETECTED NOT DETECTED Final   Proteus species NOT DETECTED NOT DETECTED Final   Salmonella species NOT DETECTED NOT DETECTED Final   Serratia marcescens NOT DETECTED NOT DETECTED Final   Haemophilus influenzae NOT DETECTED NOT DETECTED Final   Neisseria meningitidis NOT DETECTED NOT DETECTED Final   Pseudomonas aeruginosa NOT DETECTED NOT DETECTED Final   Stenotrophomonas maltophilia NOT DETECTED NOT DETECTED Final   Candida albicans NOT DETECTED NOT DETECTED Final   Candida auris NOT DETECTED NOT DETECTED Final   Candida glabrata NOT DETECTED NOT DETECTED Final   Candida krusei NOT DETECTED NOT DETECTED Final   Candida parapsilosis NOT DETECTED NOT DETECTED Final   Candida tropicalis NOT DETECTED NOT DETECTED Final   Cryptococcus neoformans/gattii NOT DETECTED NOT DETECTED Final    Comment: Performed at [facility], [place] [address] [place], [place] [address]     FURTHER DISCHARGE INSTRUCTIONS:  Get Medicines reviewed and adjusted: Please take all your medications with you for your next visit with your Primary MD  Laboratory/radiological data: Please request your Primary MD to go over all hospital tests and procedure/radiological results at the follow up, please ask your Primary MD to get all Hospital records sent to his/her office.  In some cases, they will be blood work, cultures and biopsy results pending at the time of your discharge. Please request that your primary care M.D. goes through all the records of your hospital data and follows up on these results.  Also Note the following: If you experience worsening of your admission symptoms, develop shortness of breath, life threatening emergency, suicidal or homicidal thoughts you must seek medical attention immediately by calling 911 or calling your MD immediately  if symptoms less severe.  You must read complete instructions/literature along with all the possible adverse reactions/side effects  for all the Medicines you take and that have been prescribed to you. Take any new Medicines after you have completely understood and accpet all the possible adverse reactions/side effects.   Do not drive when taking Pain medications or sleeping medications (Benzodaizepines)  Do not take more than prescribed Pain, Sleep and Anxiety Medications. It is not advisable to combine anxiety,sleep and pain medications without talking with your primary care practitioner  Special Instructions: If you have smoked or chewed Tobacco  in the last 2 yrs please stop smoking, stop any regular Alcohol  and or any Recreational drug use.  Wear Seat belts while driving.  Please note: You were cared for by a hospitalist during your hospital stay. Once you are discharged, your primary care physician will handle any further medical issues. Please note that NO REFILLS for any discharge medications will be authorized once you  are discharged, as it is imperative that you return to your primary care physician (or establish a relationship with a primary care physician if you do not have one) for your post hospital discharge needs so that they can reassess your need for medications and monitor your lab values.  Total Time spent coordinating discharge including counseling, education and face to face time equals greater than 30 minutes.  SignedOren Binet 03/06/2022 9:46 AM

## 2022-03-07 LAB — GLUCOSE, CAPILLARY
Glucose-Capillary: 123 mg/dL — ABNORMAL HIGH (ref 70–99)
Glucose-Capillary: 93 mg/dL (ref 70–99)
Glucose-Capillary: 88 mg/dL (ref 70–99)

## 2022-03-07 NOTE — Progress Notes (Signed)
Sitting up in bed-nonverbal but more awake/alert compared to yesterday.  Remains medically stable for discharge-please see discharge summary done on 8/21.

## 2022-03-07 NOTE — TOC Transition Note (Addendum)
Transition of Care [facility]) - CM/SW Discharge Note   Patient Details  Name: Jack Rivas MRN: [id number] Date of Birth: 1945-07-25  Transition of Care [facility]) CM/SW Contact:  Lawerance Sabal, RN Phone Number: 03/07/2022, 8:15 AM   Clinical Narrative:    Sherron Monday w daughter [name] to confirm plans for DC today. She asked that he be home around noon, as she will not be home this morning, she has an appointment. Rescheduled [facility] for noon.  Plans for [facility] remain the same. No other TOC needs identified for DC  Notified [facility] liaison Kia that patient will DC to home w [facility]. Kia provided the name of Adele Dan at [phone] who she thought may be a legal guardian. Relaid this information to CSW who had no knowledge of this. CSw Modena Morrow called number and reports this is a lawyer's office Spoke w patient's daughter [name] who states that she has POA for her father. ACC notes from facility prior to admission show communication with daughter [name]. No documentation regarding a  legal guardian in Epic.  Plan unchanged, patient will DC to home with daughter [name] today    Final next level of care: Home w Hospice Care Barriers to Discharge: No Barriers Identified   Patient Goals and CMS Choice Patient states their goals for this hospitalization and ongoing recovery are:: "I don't know"      Discharge Placement                       Discharge Plan and Services                                     Social Determinants of Health (SDOH) Interventions     Readmission Risk Interventions    03/28/2021    3:34 PM  Readmission Risk Prevention Plan  Transportation Screening Complete  PCP or Specialist Appt within 3-5 Days Complete  HRI or Home Care Consult Complete  Social Work Consult for Recovery Care Planning/Counseling Complete  Palliative Care Screening Not Applicable  Medication Review [employer]) Complete

## 2022-03-08 ENCOUNTER — Other Ambulatory Visit (HOSPITAL_COMMUNITY): Payer: Self-pay

## 2022-03-12 ENCOUNTER — Emergency Department (HOSPITAL_COMMUNITY): Payer: Medicare Other

## 2022-03-12 ENCOUNTER — Other Ambulatory Visit: Payer: Self-pay

## 2022-03-12 ENCOUNTER — Emergency Department (HOSPITAL_COMMUNITY)
Admission: EM | Admit: 2022-03-12 | Discharge: 2022-03-13 | Disposition: A | Discharge status: Home / Self Care | Payer: Medicare Other | Attending: Emergency Medicine | Admitting: Emergency Medicine

## 2022-03-12 ENCOUNTER — Encounter (HOSPITAL_COMMUNITY): Payer: Self-pay | Admitting: Emergency Medicine

## 2022-03-12 DIAGNOSIS — W19XXXA Unspecified fall, initial encounter: Secondary | ICD-10-CM | POA: Insufficient documentation

## 2022-03-12 DIAGNOSIS — F17210 Nicotine dependence, cigarettes, uncomplicated: Secondary | ICD-10-CM | POA: Insufficient documentation

## 2022-03-12 DIAGNOSIS — R059 Cough, unspecified: Secondary | ICD-10-CM | POA: Diagnosis present

## 2022-03-12 DIAGNOSIS — S0990XA Unspecified injury of head, initial encounter: Secondary | ICD-10-CM | POA: Diagnosis not present

## 2022-03-12 DIAGNOSIS — I10 Essential (primary) hypertension: Secondary | ICD-10-CM | POA: Insufficient documentation

## 2022-03-12 DIAGNOSIS — Z515 Encounter for palliative care: Secondary | ICD-10-CM

## 2022-03-12 DIAGNOSIS — R001 Bradycardia, unspecified: Secondary | ICD-10-CM | POA: Diagnosis not present

## 2022-03-12 DIAGNOSIS — Z79899 Other long term (current) drug therapy: Secondary | ICD-10-CM | POA: Diagnosis not present

## 2022-03-12 DIAGNOSIS — Z20822 Contact with and (suspected) exposure to covid-19: Secondary | ICD-10-CM | POA: Diagnosis not present

## 2022-03-12 DIAGNOSIS — E162 Hypoglycemia, unspecified: Secondary | ICD-10-CM | POA: Diagnosis not present

## 2022-03-12 DIAGNOSIS — F03911 Unspecified dementia, unspecified severity, with agitation: Secondary | ICD-10-CM | POA: Diagnosis not present

## 2022-03-12 DIAGNOSIS — F0390 Unspecified dementia without behavioral disturbance: Secondary | ICD-10-CM | POA: Insufficient documentation

## 2022-03-12 LAB — COMPREHENSIVE METABOLIC PANEL
ALT: 52 U/L — ABNORMAL HIGH (ref 0–44)
AST: 47 U/L — ABNORMAL HIGH (ref 15–41)
Albumin: 3.5 g/dL (ref 3.5–5.0)
Alkaline Phosphatase: 76 U/L (ref 38–126)
Anion gap: 9 (ref 5–15)
BUN: 38 mg/dL — ABNORMAL HIGH (ref 8–23)
CO2: 31 mmol/L (ref 22–32)
Calcium: 9.5 mg/dL (ref 8.9–10.3)
Chloride: 103 mmol/L (ref 98–111)
Creatinine, Ser: 1.61 mg/dL — ABNORMAL HIGH (ref 0.61–1.24)
GFR, Estimated: 44 mL/min — ABNORMAL LOW (ref >60)
Glucose, Bld: 78 mg/dL (ref 70–99)
Potassium: 4.5 mmol/L (ref 3.5–5.1)
Sodium: 143 mmol/L (ref 135–145)
Total Bilirubin: 0.3 mg/dL (ref 0.3–1.2)
Total Protein: 8.3 g/dL — ABNORMAL HIGH (ref 6.5–8.1)

## 2022-03-12 LAB — CBC
HCT: 32.5 % — ABNORMAL LOW (ref 39.0–52.0)
Hemoglobin: 10.2 g/dL — ABNORMAL LOW (ref 13.0–17.0)
MCH: 29 pg (ref 26.0–34.0)
MCHC: 31.4 g/dL (ref 30.0–36.0)
MCV: 92.3 fL (ref 80.0–100.0)
Platelets: 127 10*3/uL — ABNORMAL LOW (ref 150–400)
RBC: 3.52 MIL/uL — ABNORMAL LOW (ref 4.22–5.81)
RDW: 13.4 % (ref 11.5–15.5)
WBC: 5.5 10*3/uL (ref 4.0–10.5)
nRBC: 0 % (ref 0.0–0.2)

## 2022-03-12 LAB — RESP PANEL BY RT-PCR (FLU A&B, COVID) ARPGX2
Influenza A by PCR: NEGATIVE
Influenza B by PCR: NEGATIVE
SARS Coronavirus 2 by RT PCR: NEGATIVE

## 2022-03-12 LAB — TSH: TSH: 5.056 u[IU]/mL — ABNORMAL HIGH (ref 0.350–4.500)

## 2022-03-12 LAB — CBG MONITORING, ED
Glucose-Capillary: 71 mg/dL (ref 70–99)
Glucose-Capillary: 70 mg/dL (ref 70–99)

## 2022-03-12 MED ORDER — SODIUM CHLORIDE 0.9 % IV BOLUS
500.0000 mL | Freq: Once | INTRAVENOUS | Status: AC
  Administered 2022-03-12: 500 mL via INTRAVENOUS

## 2022-03-12 MED ORDER — RISPERIDONE 0.5 MG PO TABS: 0.5000 mg | ORAL_TABLET | Freq: Every day | ORAL | 0 refills | Status: DC

## 2022-03-12 MED ORDER — DEXTROSE 50 % IV SOLN
1.0000 | Freq: Once | INTRAVENOUS | Status: AC
  Administered 2022-03-12: 50 mL via INTRAVENOUS
  Filled 2022-03-12: qty 50

## 2022-03-12 MED ORDER — SODIUM CHLORIDE 0.9 % IV SOLN: INTRAVENOUS | Status: AC

## 2022-03-12 MED ORDER — STERILE WATER FOR INJECTION IJ SOLN
INTRAMUSCULAR | Status: AC
  Administered 2022-03-12: 1.2 mL
  Filled 2022-03-12: qty 10

## 2022-03-12 MED ORDER — ZIPRASIDONE MESYLATE 20 MG IM SOLR
20.0000 mg | Freq: Once | INTRAMUSCULAR | Status: AC
Start: 2022-03-12 — End: 2022-03-12
  Administered 2022-03-12: 20 mg via INTRAMUSCULAR
  Filled 2022-03-12: qty 20

## 2022-03-12 NOTE — Discharge Instructions (Addendum)
It was a pleasure caring for you today in the emergency department.  Please return to the emergency department for any worsening or worrisome symptoms.  A prescription for Risperdal was sent to his pharmacy.  Start with 0.5 mg at night in his tube but if that does not seem to be strong enough you can increase to 1 mg which would be 2 tablets.  Hospice is aware and is currently looking for placement.

## 2022-03-12 NOTE — Progress Notes (Signed)
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 Please call with any questions or concerns. Thank you   Lynder Parents [facility] Liaison  [phone]

## 2022-03-12 NOTE — ED Triage Notes (Signed)
Pt BIBA from home. Pt on hospice care. EMS called for cough.  Brady at [phone].  Adv dementia. Alert and oriented to self.  BP: 121/55 SPO2: 98 RA

## 2022-03-12 NOTE — ED Notes (Signed)
Rocky Crafts (daughter) [phone] [address] [place] [address]
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
## 2022-03-13 ENCOUNTER — Inpatient Hospital Stay (HOSPITAL_BASED_OUTPATIENT_CLINIC_OR_DEPARTMENT_OTHER)
Admission: EM | Admit: 2022-03-13 | Discharge: 2022-03-16 | DRG: 641 | Disposition: A | Discharge status: Skilled Nursing Facility | Attending: Family Medicine | Admitting: Family Medicine

## 2022-03-13 ENCOUNTER — Emergency Department (HOSPITAL_BASED_OUTPATIENT_CLINIC_OR_DEPARTMENT_OTHER)

## 2022-03-13 ENCOUNTER — Encounter (HOSPITAL_COMMUNITY): Payer: Self-pay

## 2022-03-13 ENCOUNTER — Other Ambulatory Visit: Payer: Self-pay

## 2022-03-13 ENCOUNTER — Emergency Department (HOSPITAL_COMMUNITY)
Admission: EM | Admit: 2022-03-13 | Discharge: 2022-03-13 | Disposition: A | Discharge status: Home / Self Care | Payer: Medicare Other | Source: Home / Self Care | Attending: Emergency Medicine | Admitting: Emergency Medicine

## 2022-03-13 ENCOUNTER — Emergency Department (HOSPITAL_COMMUNITY): Payer: Medicare Other

## 2022-03-13 ENCOUNTER — Encounter (HOSPITAL_BASED_OUTPATIENT_CLINIC_OR_DEPARTMENT_OTHER): Payer: Self-pay | Admitting: Emergency Medicine

## 2022-03-13 DIAGNOSIS — Z515 Encounter for palliative care: Secondary | ICD-10-CM

## 2022-03-13 DIAGNOSIS — Z8619 Personal history of other infectious and parasitic diseases: Secondary | ICD-10-CM

## 2022-03-13 DIAGNOSIS — Z431 Encounter for attention to gastrostomy: Secondary | ICD-10-CM

## 2022-03-13 DIAGNOSIS — R627 Adult failure to thrive: Secondary | ICD-10-CM | POA: Diagnosis present

## 2022-03-13 DIAGNOSIS — R001 Bradycardia, unspecified: Secondary | ICD-10-CM | POA: Diagnosis present

## 2022-03-13 DIAGNOSIS — E162 Hypoglycemia, unspecified: Principal | ICD-10-CM | POA: Diagnosis present

## 2022-03-13 DIAGNOSIS — Z66 Do not resuscitate: Secondary | ICD-10-CM | POA: Diagnosis present

## 2022-03-13 DIAGNOSIS — R059 Cough, unspecified: Secondary | ICD-10-CM | POA: Diagnosis not present

## 2022-03-13 DIAGNOSIS — F0390 Unspecified dementia without behavioral disturbance: Secondary | ICD-10-CM | POA: Diagnosis present

## 2022-03-13 DIAGNOSIS — F03C11 Unspecified dementia, severe, with agitation: Secondary | ICD-10-CM | POA: Diagnosis present

## 2022-03-13 DIAGNOSIS — W19XXXA Unspecified fall, initial encounter: Secondary | ICD-10-CM

## 2022-03-13 DIAGNOSIS — F17200 Nicotine dependence, unspecified, uncomplicated: Secondary | ICD-10-CM | POA: Insufficient documentation

## 2022-03-13 DIAGNOSIS — E440 Moderate protein-calorie malnutrition: Secondary | ICD-10-CM | POA: Diagnosis present

## 2022-03-13 DIAGNOSIS — S0990XA Unspecified injury of head, initial encounter: Secondary | ICD-10-CM | POA: Insufficient documentation

## 2022-03-13 DIAGNOSIS — Z6823 Body mass index (BMI) 23.0-23.9, adult: Secondary | ICD-10-CM

## 2022-03-13 DIAGNOSIS — Z79899 Other long term (current) drug therapy: Secondary | ICD-10-CM

## 2022-03-13 DIAGNOSIS — S32019A Unspecified fracture of first lumbar vertebra, initial encounter for closed fracture: Secondary | ICD-10-CM | POA: Diagnosis present

## 2022-03-13 DIAGNOSIS — F03C18 Unspecified dementia, severe, with other behavioral disturbance: Secondary | ICD-10-CM | POA: Diagnosis present

## 2022-03-13 DIAGNOSIS — I10 Essential (primary) hypertension: Secondary | ICD-10-CM | POA: Insufficient documentation

## 2022-03-13 DIAGNOSIS — N189 Chronic kidney disease, unspecified: Secondary | ICD-10-CM | POA: Diagnosis present

## 2022-03-13 DIAGNOSIS — R1310 Dysphagia, unspecified: Secondary | ICD-10-CM | POA: Diagnosis present

## 2022-03-13 DIAGNOSIS — Z4659 Encounter for fitting and adjustment of other gastrointestinal appliance and device: Secondary | ICD-10-CM

## 2022-03-13 DIAGNOSIS — X58XXXA Exposure to other specified factors, initial encounter: Secondary | ICD-10-CM | POA: Diagnosis present

## 2022-03-13 DIAGNOSIS — I129 Hypertensive chronic kidney disease with stage 1 through stage 4 chronic kidney disease, or unspecified chronic kidney disease: Secondary | ICD-10-CM | POA: Diagnosis present

## 2022-03-13 LAB — BASIC METABOLIC PANEL
Anion gap: 5 (ref 5–15)
BUN: 31 mg/dL — ABNORMAL HIGH (ref 8–23)
CO2: 28 mmol/L (ref 22–32)
Calcium: 8.8 mg/dL — ABNORMAL LOW (ref 8.9–10.3)
Chloride: 106 mmol/L (ref 98–111)
Creatinine, Ser: 1.05 mg/dL (ref 0.61–1.24)
GFR, Estimated: >60 mL/min (ref >60)
Glucose, Bld: 79 mg/dL (ref 70–99)
Potassium: 4.5 mmol/L (ref 3.5–5.1)
Sodium: 139 mmol/L (ref 135–145)

## 2022-03-13 LAB — CBC WITH DIFFERENTIAL/PLATELET
Abs Immature Granulocytes: 0.03 10*3/uL (ref 0.00–0.07)
Basophils Absolute: 0 10*3/uL (ref 0.0–0.1)
Basophils Relative: 1 %
Eosinophils Absolute: 0.8 10*3/uL — ABNORMAL HIGH (ref 0.0–0.5)
Eosinophils Relative: 17 %
HCT: 31.3 % — ABNORMAL LOW (ref 39.0–52.0)
Hemoglobin: 10.2 g/dL — ABNORMAL LOW (ref 13.0–17.0)
Immature Granulocytes: 1 %
Lymphocytes Relative: 25 %
Lymphs Abs: 1.2 10*3/uL (ref 0.7–4.0)
MCH: 29.5 pg (ref 26.0–34.0)
MCHC: 32.6 g/dL (ref 30.0–36.0)
MCV: 90.5 fL (ref 80.0–100.0)
Monocytes Absolute: 0.4 10*3/uL (ref 0.1–1.0)
Monocytes Relative: 9 %
Neutro Abs: 2.2 10*3/uL (ref 1.7–7.7)
Neutrophils Relative %: 47 %
Platelets: 107 10*3/uL — ABNORMAL LOW (ref 150–400)
RBC: 3.46 MIL/uL — ABNORMAL LOW (ref 4.22–5.81)
RDW: 13.4 % (ref 11.5–15.5)
WBC: 4.6 10*3/uL (ref 4.0–10.5)
nRBC: 0 % (ref 0.0–0.2)

## 2022-03-13 LAB — CBG MONITORING, ED
Glucose-Capillary: 51 mg/dL — ABNORMAL LOW (ref 70–99)
Glucose-Capillary: 119 mg/dL — ABNORMAL HIGH (ref 70–99)
Glucose-Capillary: 113 mg/dL — ABNORMAL HIGH (ref 70–99)
Glucose-Capillary: 125 mg/dL — ABNORMAL HIGH (ref 70–99)
Glucose-Capillary: 70 mg/dL (ref 70–99)
Glucose-Capillary: 60 mg/dL — ABNORMAL LOW (ref 70–99)

## 2022-03-13 MED ORDER — SODIUM CHLORIDE 0.9% FLUSH
3.0000 mL | INTRAVENOUS | Status: DC | PRN
  Filled 2022-03-13: qty 3

## 2022-03-13 MED ORDER — SODIUM CHLORIDE 0.9% FLUSH
3.0000 mL | Freq: Two times a day (BID) | INTRAVENOUS | Status: DC
  Administered 2022-03-13 (×2): 3 mL via INTRAVENOUS
  Filled 2022-03-13: qty 3

## 2022-03-13 MED ORDER — DEXTROSE 50 % IV SOLN
1.0000 | Freq: Once | INTRAVENOUS | Status: AC
  Administered 2022-03-13: 50 mL via INTRAVENOUS
  Filled 2022-03-13: qty 50

## 2022-03-13 MED ORDER — IOHEXOL 9 MG/ML PO SOLN
500.0000 mL | Freq: Once | ORAL | Status: AC
  Administered 2022-03-13: 500 mL via ORAL

## 2022-03-13 MED ORDER — SODIUM CHLORIDE 0.9 % IV SOLN
250.0000 mL | INTRAVENOUS | Status: DC | PRN
  Administered 2022-03-14: 250 mL via INTRAVENOUS

## 2022-03-13 NOTE — ED Provider Notes (Signed)
[facility] EMERGENCY DEPARTMENT Provider Note   CSN: [id number] Arrival date & time: 03/13/22  1330     History  Chief Complaint  Patient presents with   PEG tube problem    Jack Rivas is a 76 y.o. male.  The history is provided by medical records and the EMS personnel (ems report to nursing). The history is limited by the condition of the patient. No language interpreter was used.  Illness Location:  Suspected problem with G-tube Quality:  Possible partially pulled out Severity:  Unable to specify Onset quality:  Unable to specify Duration:  1 day Timing:  Unable to specify Progression:  Unable to specify Chronicity:  New Associated symptoms: no abdominal pain, no chest pain, no congestion, no fever, no headaches, no nausea, no rash, no shortness of breath, no vomiting and no wheezing        Home Medications Prior to Admission medications   Medication Sig Start Date End Date Taking? Authorizing Provider  acetaminophen (TYLENOL) 160 MG/5ML suspension Place 10.2 mLs (325 mg total) into feeding tube every 6 (six) hours as needed for mild pain, headache or fever. 03/06/22   Ghimire, Werner Lean, MD  diclofenac Sodium (VOLTAREN) 1 % GEL Apply 2 g topically 4 (four) times daily. 03/06/22   Ghimire, Werner Lean, MD  haloperidol (HALDOL) 5 MG tablet Take 5 mg by mouth every 4 (four) hours as needed for agitation.    [provider]  LORazepam (ATIVAN) 0.5 MG tablet Place 1 tablet (0.5 mg total) into feeding tube daily. 03/06/22   Ghimire, Werner Lean, MD  Nutritional Supplements (FEEDING SUPPLEMENT, JEVITY 1.5 CAL/FIBER,) LIQD Place 1,000 mLs into feeding tube continuous. At 55 ml/hr 03/06/22   Ghimire, Werner Lean, MD  polyethylene glycol powder (GLYCOLAX/MIRALAX) 17 GM/SCOOP powder Place 17 g into feeding tube daily. 03/06/22   Ghimire, Werner Lean, MD  Protein (FEEDING SUPPLEMENT, PROSOURCE JJ94,) liquid Place 60 mLs into feeding tube daily. 03/06/22 06/04/22  GhimireWerner Lean, MD  Water For Irrigation, Sterile (FREE WATER) SOLN Place 200 mLs into feeding tube every 4 (four) hours. 03/06/22 06/04/22  Ghimire, Werner Lean, MD      Allergies    Patient has no known allergies.    Review of Systems   Review of Systems  Unable to perform ROS: Dementia (Patient not a good historian but is able to say yes and no to some ROS questions)  Constitutional:  Negative for fever.  HENT:  Negative for congestion.   Respiratory:  Negative for shortness of breath and wheezing.   Cardiovascular:  Negative for chest pain.  Gastrointestinal:  Negative for abdominal pain, nausea and vomiting.  Genitourinary:  Negative for flank pain.  Skin:  Negative for rash.  Neurological:  Negative for headaches.  Psychiatric/Behavioral:  Negative for agitation.     Physical Exam Updated Vital Signs Ht 6\' 4"  (1.93 m)   Wt 88.4 kg   BMI 23.72 kg/m  Physical Exam Vitals and nursing note reviewed.  Constitutional:      General: He is not in acute distress.    Appearance: He is well-developed. He is not ill-appearing.  HENT:     Head: Normocephalic and atraumatic.     Nose: No congestion or rhinorrhea.     Mouth/Throat:     Mouth: Mucous membranes are moist.  Eyes:     Extraocular Movements: Extraocular movements intact.     Conjunctiva/sclera: Conjunctivae normal.     Pupils: Pupils are equal, round, and  reactive to light.  Cardiovascular:     Rate and Rhythm: Normal rate and regular rhythm.     Heart sounds: No murmur heard. Pulmonary:     Effort: Pulmonary effort is normal. No respiratory distress.     Breath sounds: Normal breath sounds.  Abdominal:     General: Abdomen is flat.     Palpations: Abdomen is soft.     Tenderness: There is no abdominal tenderness. There is no guarding or rebound.     Comments: G-tube appears to be in place in left abdomen.  Nontender.  Fluid in the lumen of the tube.  Musculoskeletal:        General: No swelling or tenderness.      Cervical back: Neck supple. No tenderness.  Skin:    General: Skin is warm and dry.     Capillary Refill: Capillary refill takes less than 2 seconds.     Findings: No erythema or rash.  Neurological:     Mental Status: He is alert.     ED Results / Procedures / Treatments   Labs (all labs ordered are listed, but only abnormal results are displayed) Labs Reviewed  BASIC METABOLIC PANEL - Abnormal; Notable for the following components:      Result Value   BUN 31 (*)    Calcium 8.8 (*)    All other components within normal limits  CBC WITH DIFFERENTIAL/PLATELET - Abnormal; Notable for the following components:   RBC 3.46 (*)    Hemoglobin 10.2 (*)    HCT 31.3 (*)    Platelets 107 (*)    Eosinophils Absolute 0.8 (*)    All other components within normal limits  CBG MONITORING, ED - Abnormal; Notable for the following components:   Glucose-Capillary 51 (*)    All other components within normal limits  CBG MONITORING, ED - Abnormal; Notable for the following components:   Glucose-Capillary 119 (*)    All other components within normal limits  CBG MONITORING, ED - Abnormal; Notable for the following components:   Glucose-Capillary 125 (*)    All other components within normal limits  CBG MONITORING, ED - Abnormal; Notable for the following components:   Glucose-Capillary 113 (*)    All other components within normal limits  CBG MONITORING, ED - Abnormal; Notable for the following components:   Glucose-Capillary 60 (*)    All other components within normal limits  CBG MONITORING, ED    EKG None  Radiology CT ABDOMEN PELVIS WO CONTRAST  Result Date: 03/13/2022 CLINICAL DATA:  Question of G-tube dysfunction. EXAM: CT ABDOMEN AND PELVIS WITHOUT CONTRAST TECHNIQUE: Multidetector CT imaging of the abdomen and pelvis was performed following the standard protocol without IV contrast. RADIATION DOSE REDUCTION: This exam was performed according to the departmental dose-optimization  program which includes automated exposure control, adjustment of the mA and/or kV according to patient size and/or use of iterative reconstruction technique. COMPARISON:  Abdominal radiographs of the same date. CT of the chest of October 24, 2021 FINDINGS: Lower chest: Patchy airspace opacities at the lung bases. These are improved compared to more remote imaging but again compatible with post aspiration change a component of which is likely chronic. No dense consolidative process. Hepatobiliary: Liver with smooth contours. No pericholecystic stranding. Pancreas: Pancreatic atrophy without signs of inflammation. Spleen: Normal. Adrenals/Urinary Tract: Adrenal glands are normal. Smooth renal contours without hydronephrosis. No ureteral dilation or nephrolithiasis. Stomach/Bowel: G-tube in the stomach. No stranding along the tract. No pneumoperitoneum. No contrast  media is present in the stomach. No contrast media is present in the small bowel aside from mildly dilute contrast media perhaps administered four attempt at G-tube evaluation earlier. There is dense contrast media in the colon from the patient's previous swallow evaluation. This is mainly in the distal colon. No acute colonic or small bowel process.  The appendix is normal. Vascular/Lymphatic: Aortic atherosclerosis.  No aneurysmal dilation. No adenopathy in the pelvis or in the abdomen. Reproductive: Unremarkable by CT. Other: No pneumoperitoneum.  No ascites. Musculoskeletal: Heterotopic ossification about the bilateral hips. Spinal degenerative changes. Interval compression fracture at L1 with mild 10% to 20% loss of height without surrounding stranding has occurred since September of 2022. IMPRESSION: 1. G-tube in the stomach. No pneumoperitoneum or stranding around the G2. No contrast media in the stomach. 2. Patchy airspace opacities at the lung bases are improved compared to more remote imaging but again compatible with post aspiration change a  component of which is likely chronic. Correlate with any ongoing respiratory symptoms. 3. L1 compression fracture is new since September of 2022. Correlate with any pain in this area. 4. Aortic atherosclerosis. Aortic Atherosclerosis (ICD10-I70.0). Electronically Signed   By: Donzetta Kohut M.D.   On: 03/13/2022 20:40   DG Abd 1 View  Result Date: 03/13/2022 CLINICAL DATA:  280330; peg tube problem, check placement. EXAM: ABDOMEN-1 precontrast and 2 post-contrast supine views of the abdomen were obtained. Contrast: 50 mL of Omnipaque COMPARISON:  January 25, 2022 FINDINGS: The precontrast supine view of the abdomen demonstrates oral contrast opacifying the descending colon, sigmoid and rectosigmoid. Extensive diverticulosis of the sigmoid colon. The tip of the G-tube appears to project at the region of the body of the stomach. Contrast is not well delineated in the gastric lumen. Bowel-gas pattern is nonobstructive. IMPRESSION: 1. Precontrast imaging of the abdomen demonstrates oral contrast from the previous study in the descending colon, sigmoid and rectosigmoid. Extensive diverticulosis of the sigmoid colon. 2. Omnipaque contrast is not well delineated in the gastric lumen. Suggest injecting the contrast through the G-tube and evaluating the G-tube under continuous fluoroscopic examination. Electronically Signed   By: Marjo Bicker M.D.   On: 03/13/2022 18:23   CT Head Wo Contrast  Result Date: 03/13/2022 CLINICAL DATA:  Status post fall. EXAM: CT HEAD WITHOUT CONTRAST TECHNIQUE: Contiguous axial images were obtained from the base of the skull through the vertex without intravenous contrast. RADIATION DOSE REDUCTION: This exam was performed according to the departmental dose-optimization program which includes automated exposure control, adjustment of the mA and/or kV according to patient size and/or use of iterative reconstruction technique. COMPARISON:  March 01, 2022 FINDINGS: Brain: There is mild  cerebral atrophy with widening of the extra-axial spaces and ventricular dilatation. There are areas of decreased attenuation within the white matter tracts of the supratentorial brain, consistent with microvascular disease changes. Vascular: There is marked severity calcification of the bilateral cavernous carotid arteries. Skull: A chronic nondisplaced left-sided nasal bone fracture is seen. Sinuses/Orbits: Mild left maxillary sinus and mild bilateral ethmoid sinus mucosal thickening is seen. Marked severity right maxillary sinus and sphenoid sinus mucosal thickening is also noted. Other: None. IMPRESSION: 1. No acute intracranial abnormality. 2. Generalized cerebral atrophy and microvascular disease changes of the supratentorial brain. 3. Chronic nondisplaced left-sided nasal bone fracture. 4. Paranasal sinus disease, as described above. Electronically Signed   By: Aram Candela M.D.   On: 03/13/2022 03:04   DG Chest 1 View  Result Date: 03/12/2022 CLINICAL DATA:  Cough. EXAM: CHEST  1 VIEW COMPARISON:  March 01, 2022. FINDINGS: The heart size and mediastinal contours are within normal limits. Both lungs are clear. Status post left shoulder arthroplasty. IMPRESSION: No active disease. Electronically Signed   By: Lupita Raider M.D.   On: 03/12/2022 09:57    Procedures Procedures    Medications Ordered in ED Medications  sodium chloride flush (NS) 0.9 % injection 3 mL (has no administration in time range)  sodium chloride flush (NS) 0.9 % injection 3 mL (has no administration in time range)  0.9 %  sodium chloride infusion (has no administration in time range)  dextrose 50 % solution 50 mL (has no administration in time range)    ED Course/ Medical Decision Making/ A&P                           Medical Decision Making Amount and/or Complexity of Data Reviewed Labs: ordered. Radiology: ordered.  Risk Prescription drug management. Decision regarding hospitalization.    Jamichael Knotts is a 76 y.o. male with a past medical history significant for CKD, hypertension, dementia, feeding tube dependence, and currently on hospice who has been to the hospital 3 times in the last 48 hours who presents for possible G-tube dislocation.  According to nursing report and EMS, there was concern that the G-tube may have been pulled slightly out and patient is confused.  Is unclear what his mental status baseline is.  Chart review shows that he was seen yesterday and overnight for cough and agitation.  Patient's work-up included labs and imaging and he was given some dextrose for hypoglycemia.  Patient was deemed stable for discharge home and was discharged.  He then was brought back overnight for fall and return for evaluation of head injury.  Imaging of the head was reassuring and patient was discharged back home.  Patient now presents today for possible G-tube problem.  On my exam, patient is resting comfortably.  He is not answering any questions fully but seems to say no when I ask him about any pain.  Lungs were clear and chest was nontender.  Abdomen was nontender.  G-tube appears to be taped to him with the hub touching the skin.  Appears to be working normally.  Otherwise patient was moving all extremities and pupils are symmetric and reactive.  Patient does not appear in any distress and is not agitated at this time.  Patient had a glucose that was checked and it was 51.  He was given D50.  We will trend glucoses.  We will get some screening labs with a metabolic panel and CBC.  Nursing will flush to make sure the G-tube is working and if it does appear to be flushing, we will get imaging to confirm correct placement.  If it does, we will discuss with hospice team plan given this third visit in 48 hours.  7:08 PM I personally went into the x-ray room and watch the contrast administration both times and it is difficult to see the contrast on the images.  I called and spoke to radiology  who report that they cannot confirm that the tube was in the right place and recommended IR to do continuous fluoroscopy to determine positioning.  Patient will likely need to be transferred for this and I will contact interventional radiology to discuss a plan.  Otherwise patient was found to be hypothermic and was put on a [employer] and is  also bradycardic which appears he has had in the past.  Patient's hypoglycemia has improved and we are monitoring that.  7:26 PM Just spoke to interventional radiology.  They requested a CT scan without contrast of the abdomen and pelvis to try to help determine where the tip of this tube is currently.  He requested a call back and then we can discuss a plan if it does not confirm correct location.  10:24 PM CT was completed and radiology called me personally to go to the images and it does appear that the tube is in the stomach appropriately.  Otherwise, opacities in the lungs appear to be improving compared to prior and he does have a small age-indeterminate L1 fracture.  Patient was not complaining of back pain initially on my exam.  As the patient was sent for G-tube management and now we have confirmed is in the correct place, we do feel he is safe for discharge back home to continue his outpatient hospice care.  Patient was slightly hypothermic initially and he was placed on a Bair hugger with some improvement.  He also appears to chronically have bradycardia which was present here and his glucose was variable.  Now the G-tube was in correct addition he will be able to get his meds and be normal at home.  10:44 PM Had a lengthy conversation with family and while I was having a conversation I was informed that his glucose continues to wax and wane now going down to 60 despite not getting any medications.  With his recurrent downtrending glucoses and the family concern for persistent altered mental status, they do not feel safe with him going home.  We will give  him some more D50 and admit him for altered mental status with recurrent episodes of hypoglycemia and hypothermia requiring Bair hugger.  Will call for admission.  Spoke with medicine who agrees with admission.         Final Clinical Impression(s) / ED Diagnoses Final diagnoses:  Encounter for care related to feeding tube  Hypoglycemia   Clinical Impression: 1. Encounter for care related to feeding tube   2. Hypoglycemia     Disposition: Admit  This note was prepared with assistance of Dragon voice recognition software. Occasional wrong-word or sound-a-like substitutions may have occurred due to the inherent limitations of voice recognition software.       Samaiya Awadallah, Canary Brimhristopher J, MD 03/13/22 [phone]

## 2022-03-13 NOTE — ED Notes (Signed)
Pt sleeping at this time.

## 2022-03-13 NOTE — ED Provider Notes (Signed)
[facility]-EMERGENCY DEPT Provider Note: Lowella Dell, MD, FACEP  CSN: [id number] MRN: [id number] ARRIVAL: 03/13/22 at 0056 ROOM: [facility]   CHIEF COMPLAINT  Head Injury  5 caveat: Dementia HISTORY OF PRESENT ILLNESS  03/13/22 1:50 AM Jack Rivas is a 76 y.o. male who was seen yesterday for cough and agitation.  He was given Risperdal and discharged home.  During transport home the patient had a fall witnessed by the fire department.  The family requested he be returned for reevaluation of concern for head injury.  The patient points to his forehead when asked if he hurts anywhere.   Past Medical History:  Diagnosis Date   Dementia (HCC)    Hypertension    Uses feeding tube     Past Surgical History:  Procedure Laterality Date   CATARACT EXTRACTION, BILATERAL     KNEE SURGERY      History reviewed. No pertinent family history.  Social History   Tobacco Use   Smoking status: Light Smoker   Smokeless tobacco: Never  Vaping Use   Vaping Use: Unknown  Substance Use Topics   Alcohol use: Not Currently   Drug use: Never    Prior to Admission medications   Medication Sig Start Date End Date Taking? Authorizing Provider  acetaminophen (TYLENOL) 160 MG/5ML suspension Place 10.2 mLs (325 mg total) into feeding tube every 6 (six) hours as needed for mild pain, headache or fever. 03/06/22  Yes Ghimire, Werner Lean, MD  diclofenac Sodium (VOLTAREN) 1 % GEL Apply 2 g topically 4 (four) times daily. 03/06/22  Yes Ghimire, Werner Lean, MD  haloperidol (HALDOL) 5 MG tablet Take 5 mg by mouth every 4 (four) hours as needed for agitation.   Yes [provider]  LORazepam (ATIVAN) 0.5 MG tablet Place 1 tablet (0.5 mg total) into feeding tube daily. 03/06/22  Yes Ghimire, Werner Lean, MD  polyethylene glycol powder (GLYCOLAX/MIRALAX) 17 GM/SCOOP powder Place 17 g into feeding tube daily. 03/06/22  Yes Ghimire, Werner Lean, MD  Protein (FEEDING SUPPLEMENT, PROSOURCE TF20,) liquid Place 60 mLs  into feeding tube daily. 03/06/22 06/04/22 Yes Ghimire, Werner Lean, MD  Nutritional Supplements (FEEDING SUPPLEMENT, JEVITY 1.5 CAL/FIBER,) LIQD Place 1,000 mLs into feeding tube continuous. At 55 ml/hr 03/06/22   Ghimire, Werner Lean, MD  Water For Irrigation, Sterile (FREE WATER) SOLN Place 200 mLs into feeding tube every 4 (four) hours. 03/06/22 06/04/22  Ghimire, Werner Lean, MD    Allergies Patient has no known allergies.   REVIEW OF SYSTEMS  Negative except as noted here or in the History of Present Illness.   PHYSICAL EXAMINATION  Initial Vital Signs Blood pressure (!) 150/86, pulse (!) 36, temperature (!) 95.4 F (35.2 C), resp. rate 16, SpO2 100 %.  Examination General: Well-developed, well-nourished male in no acute distress; appearance consistent with age of record HENT: normocephalic; atraumatic Eyes: pupils pinpoint; extraocular muscles grossly intact; bilateral pseudophakia Neck: supple; nontender Heart: regular rate and rhythm; distant sounds Lungs: clear to auscultation bilaterally Abdomen: soft; nondistended; nontender; no masses or hepatosplenomegaly; bowel sounds present Extremities: No acute deformity; arthritic changes; no pain on passive movement Neurologic: Awake, alert; motor function intact in all extremities and symmetric; no facial droop Skin: Warm and dry Psychiatric: Flat affect   RESULTS  Summary of this visit's results, reviewed and interpreted by myself:   EKG Interpretation  Date/Time:    Ventricular Rate:    PR Interval:    QRS Duration:   QT Interval:    QTC  Calculation:   R Axis:     Text Interpretation:         Laboratory Studies: Results for orders placed or performed during the hospital encounter of 03/12/22 (from the past 24 hour(s))  POC CBG, ED     Status: None   Collection Time: 03/12/22  9:22 AM  Result Value Ref Range   Glucose-Capillary 71 70 - 99 mg/dL  CBC     Status: Abnormal   Collection Time: 03/12/22 10:21 AM  Result  Value Ref Range   WBC 5.5 4.0 - 10.5 K/uL   RBC 3.52 (L) 4.22 - 5.81 MIL/uL   Hemoglobin 10.2 (L) 13.0 - 17.0 g/dL   HCT 16.1 (L) 09.6 - 04.5 %   MCV 92.3 80.0 - 100.0 fL   MCH 29.0 26.0 - 34.0 pg   MCHC 31.4 30.0 - 36.0 g/dL   RDW 40.9 81.1 - 91.4 %   Platelets 127 (L) 150 - 400 K/uL   nRBC 0.0 0.0 - 0.2 %  Comprehensive metabolic panel     Status: Abnormal   Collection Time: 03/12/22 10:21 AM  Result Value Ref Range   Sodium 143 135 - 145 mmol/L   Potassium 4.5 3.5 - 5.1 mmol/L   Chloride 103 98 - 111 mmol/L   CO2 31 22 - 32 mmol/L   Glucose, Bld 78 70 - 99 mg/dL   BUN 38 (H) 8 - 23 mg/dL   Creatinine, Ser 7.82 (H) 0.61 - 1.24 mg/dL   Calcium 9.5 8.9 - 95.6 mg/dL   Total Protein 8.3 (H) 6.5 - 8.1 g/dL   Albumin 3.5 3.5 - 5.0 g/dL   AST 47 (H) 15 - 41 U/L   ALT 52 (H) 0 - 44 U/L   Alkaline Phosphatase 76 38 - 126 U/L   Total Bilirubin 0.3 0.3 - 1.2 mg/dL   GFR, Estimated 44 (L) >60 mL/min   Anion gap 9 5 - 15  TSH     Status: Abnormal   Collection Time: 03/12/22 10:22 AM  Result Value Ref Range   TSH 5.056 (H) 0.350 - 4.500 uIU/mL  Resp Panel by RT-PCR (Flu A&B, Covid) Anterior Nasal Swab     Status: None   Collection Time: 03/12/22 10:24 AM   Specimen: Anterior Nasal Swab  Result Value Ref Range   SARS Coronavirus 2 by RT PCR NEGATIVE NEGATIVE   Influenza A by PCR NEGATIVE NEGATIVE   Influenza B by PCR NEGATIVE NEGATIVE  POC CBG, ED     Status: None   Collection Time: 03/12/22 11:38 AM  Result Value Ref Range   Glucose-Capillary 70 70 - 99 mg/dL   Imaging Studies: CT Head Wo Contrast  Result Date: 03/13/2022 CLINICAL DATA:  Status post fall. EXAM: CT HEAD WITHOUT CONTRAST TECHNIQUE: Contiguous axial images were obtained from the base of the skull through the vertex without intravenous contrast. RADIATION DOSE REDUCTION: This exam was performed according to the departmental dose-optimization program which includes automated exposure control, adjustment of the mA  and/or kV according to patient size and/or use of iterative reconstruction technique. COMPARISON:  March 01, 2022 FINDINGS: Brain: There is mild cerebral atrophy with widening of the extra-axial spaces and ventricular dilatation. There are areas of decreased attenuation within the white matter tracts of the supratentorial brain, consistent with microvascular disease changes. Vascular: There is marked severity calcification of the bilateral cavernous carotid arteries. Skull: A chronic nondisplaced left-sided nasal bone fracture is seen. Sinuses/Orbits: Mild left maxillary sinus and mild  bilateral ethmoid sinus mucosal thickening is seen. Marked severity right maxillary sinus and sphenoid sinus mucosal thickening is also noted. Other: None. IMPRESSION: 1. No acute intracranial abnormality. 2. Generalized cerebral atrophy and microvascular disease changes of the supratentorial brain. 3. Chronic nondisplaced left-sided nasal bone fracture. 4. Paranasal sinus disease, as described above. Electronically Signed   By: Aram Candela M.D.   On: 03/13/2022 03:04   DG Chest 1 View  Result Date: 03/12/2022 CLINICAL DATA:  Cough. EXAM: CHEST  1 VIEW COMPARISON:  March 01, 2022. FINDINGS: The heart size and mediastinal contours are within normal limits. Both lungs are clear. Status post left shoulder arthroplasty. IMPRESSION: No active disease. Electronically Signed   By: Lupita Raider M.D.   On: 03/12/2022 09:57    ED COURSE and MDM  Nursing notes, initial and subsequent vitals signs, including pulse oximetry, reviewed and interpreted by myself.  Vitals:   03/13/22 0111 03/13/22 0230  BP: (!) 150/86   Pulse: (!) 36 (!) 50  Resp: 16   Temp: (!) 95.4 F (35.2 C)   SpO2: 100% 100%   Medications - No data to display  No evidence of acute cranial or intracranial injury.  I believe the patient is safe for discharge back home.  PROCEDURES  Procedures   ED DIAGNOSES     ICD-10-CM   1. Fall, initial  encounter  W19.Neldon Newport, MD 03/13/22 [phone]

## 2022-03-13 NOTE — ED Triage Notes (Signed)
Pt bib [facility] from home. Pt d/c earlier. During transport back home pt had a fall witnessed by fire dept. Pt family wanted pt to come back for evaluation. Pt denies any head pain.  Pt HR per ems 30-40s SPO2 100

## 2022-03-13 NOTE — ED Notes (Signed)
PEG tube flushed with approx 21ml NS. No leaking noted. Gastric content aspirated and present. Provider aware

## 2022-03-13 NOTE — ED Notes (Signed)
Pt changed of very wet depends , sheets and diaper changed and pt placed on [employer]

## 2022-03-13 NOTE — Discharge Instructions (Signed)
His history, exam, evaluation today was primarily to rule out a problem with the G-tube.  Initially x-rays were obtained and were nondiagnostic so after speaking with several different radiologist, we confirmed a CT scan was needed and after it was completed, confirmed the appropriate placement of the G-tube without other complication.  Imaging showed improved opacities in the lungs from prior and CT also showed age-indeterminate small lumbar spine injury which patient was not complaining of today.  He may now be fed and uses tube for medications.  Given his confirmation of appropriate placement and lack of other complaints here, patient will be discharged home to continue outpatient hospice care.

## 2022-03-13 NOTE — ED Triage Notes (Signed)
Pt brought in [facility] for suspected G tube being more out. Pt is confused and is a hospice pt , pt seen already at [facility] today

## 2022-03-13 NOTE — ED Notes (Signed)
To xray per str

## 2022-03-14 DIAGNOSIS — Z8619 Personal history of other infectious and parasitic diseases: Secondary | ICD-10-CM | POA: Diagnosis not present

## 2022-03-14 DIAGNOSIS — R1310 Dysphagia, unspecified: Secondary | ICD-10-CM | POA: Diagnosis not present

## 2022-03-14 DIAGNOSIS — Z66 Do not resuscitate: Secondary | ICD-10-CM | POA: Diagnosis not present

## 2022-03-14 DIAGNOSIS — Z6823 Body mass index (BMI) 23.0-23.9, adult: Secondary | ICD-10-CM | POA: Diagnosis not present

## 2022-03-14 DIAGNOSIS — N189 Chronic kidney disease, unspecified: Secondary | ICD-10-CM | POA: Diagnosis not present

## 2022-03-14 DIAGNOSIS — S32019A Unspecified fracture of first lumbar vertebra, initial encounter for closed fracture: Secondary | ICD-10-CM | POA: Diagnosis not present

## 2022-03-14 DIAGNOSIS — Z515 Encounter for palliative care: Secondary | ICD-10-CM | POA: Diagnosis not present

## 2022-03-14 DIAGNOSIS — F03C11 Unspecified dementia, severe, with agitation: Secondary | ICD-10-CM | POA: Diagnosis not present

## 2022-03-14 DIAGNOSIS — E162 Hypoglycemia, unspecified: Secondary | ICD-10-CM

## 2022-03-14 DIAGNOSIS — R001 Bradycardia, unspecified: Secondary | ICD-10-CM | POA: Diagnosis not present

## 2022-03-14 DIAGNOSIS — I129 Hypertensive chronic kidney disease with stage 1 through stage 4 chronic kidney disease, or unspecified chronic kidney disease: Secondary | ICD-10-CM | POA: Diagnosis not present

## 2022-03-14 DIAGNOSIS — Z431 Encounter for attention to gastrostomy: Secondary | ICD-10-CM | POA: Diagnosis not present

## 2022-03-14 DIAGNOSIS — Z79899 Other long term (current) drug therapy: Secondary | ICD-10-CM | POA: Diagnosis not present

## 2022-03-14 DIAGNOSIS — R627 Adult failure to thrive: Secondary | ICD-10-CM | POA: Diagnosis not present

## 2022-03-14 DIAGNOSIS — F03C18 Unspecified dementia, severe, with other behavioral disturbance: Secondary | ICD-10-CM | POA: Diagnosis not present

## 2022-03-14 DIAGNOSIS — X58XXXA Exposure to other specified factors, initial encounter: Secondary | ICD-10-CM | POA: Diagnosis not present

## 2022-03-14 DIAGNOSIS — E440 Moderate protein-calorie malnutrition: Secondary | ICD-10-CM | POA: Diagnosis not present

## 2022-03-14 DIAGNOSIS — F17200 Nicotine dependence, unspecified, uncomplicated: Secondary | ICD-10-CM | POA: Diagnosis not present

## 2022-03-14 LAB — CBG MONITORING, ED
Glucose-Capillary: 106 mg/dL — ABNORMAL HIGH (ref 70–99)
Glucose-Capillary: 113 mg/dL — ABNORMAL HIGH (ref 70–99)
Glucose-Capillary: 121 mg/dL — ABNORMAL HIGH (ref 70–99)
Glucose-Capillary: 125 mg/dL — ABNORMAL HIGH (ref 70–99)
Glucose-Capillary: 38 mg/dL — CL (ref 70–99)
Glucose-Capillary: 63 mg/dL — ABNORMAL LOW (ref 70–99)
Glucose-Capillary: 65 mg/dL — ABNORMAL LOW (ref 70–99)
Glucose-Capillary: 69 mg/dL — ABNORMAL LOW (ref 70–99)
Glucose-Capillary: 70 mg/dL (ref 70–99)
Glucose-Capillary: 71 mg/dL (ref 70–99)
Glucose-Capillary: 72 mg/dL (ref 70–99)
Glucose-Capillary: 73 mg/dL (ref 70–99)
Glucose-Capillary: 83 mg/dL (ref 70–99)

## 2022-03-14 MED ORDER — JEVITY 1.5 CAL/FIBER PO LIQD
1000.0000 mL | ORAL | Status: DC
Start: 2022-03-14 — End: 2022-03-14

## 2022-03-14 MED ORDER — HALOPERIDOL 5 MG PO TABS
5.0000 mg | ORAL_TABLET | ORAL | Status: DC | PRN
  Administered 2022-03-15: 5 mg
  Filled 2022-03-14 (×2): qty 1

## 2022-03-14 MED ORDER — FREE WATER
200.0000 mL | Status: DC
Start: 2022-03-14 — End: 2022-03-16
  Administered 2022-03-14 – 2022-03-16 (×9): 200 mL

## 2022-03-14 MED ORDER — POLYETHYLENE GLYCOL 3350 17 G PO PACK
17.0000 g | PACK | Freq: Every day | ORAL | Status: DC
Start: 2022-03-15 — End: 2022-03-16
  Administered 2022-03-15: 17 g
  Filled 2022-03-14: qty 1

## 2022-03-14 MED ORDER — LORAZEPAM 0.5 MG PO TABS
0.5000 mg | ORAL_TABLET | Freq: Every day | ORAL | Status: DC
  Administered 2022-03-15: 0.5 mg
  Filled 2022-03-14: qty 1

## 2022-03-14 MED ORDER — ONDANSETRON HCL 4 MG/2ML IJ SOLN: 4.0000 mg | Freq: Four times a day (QID) | INTRAMUSCULAR | Status: DC | PRN

## 2022-03-14 MED ORDER — DEXTROSE 50 % IV SOLN
1.0000 | INTRAVENOUS | Status: DC | PRN
  Administered 2022-03-14 (×2): 50 mL via INTRAVENOUS
  Filled 2022-03-14 (×2): qty 50

## 2022-03-14 MED ORDER — DEXTROSE 50 % IV SOLN
1.0000 | Freq: Once | INTRAVENOUS | Status: AC
  Administered 2022-03-14: 50 mL via INTRAVENOUS

## 2022-03-14 MED ORDER — ONDANSETRON HCL 4 MG PO TABS: 4.0000 mg | ORAL_TABLET | Freq: Four times a day (QID) | ORAL | Status: DC | PRN

## 2022-03-14 MED ORDER — ENOXAPARIN SODIUM 40 MG/0.4ML IJ SOSY
40.0000 mg | PREFILLED_SYRINGE | INTRAMUSCULAR | Status: DC
  Administered 2022-03-14 – 2022-03-15 (×2): 40 mg via SUBCUTANEOUS
  Filled 2022-03-14 (×2): qty 0.4

## 2022-03-14 MED ORDER — PROSOURCE TF20 ENFIT COMPATIBL EN LIQD
60.0000 mL | Freq: Every day | ENTERAL | Status: DC
Start: 2022-03-15 — End: 2022-03-14

## 2022-03-14 MED ORDER — DEXTROSE 5 % IV SOLN: Freq: Once | INTRAVENOUS | Status: AC

## 2022-03-14 MED ORDER — ACETAMINOPHEN 160 MG/5ML PO SOLN
325.0000 mg | Freq: Four times a day (QID) | ORAL | Status: DC | PRN
  Administered 2022-03-15: 325 mg
  Filled 2022-03-14: qty 20.3

## 2022-03-14 MED ORDER — DEXTROSE 50 % IV SOLN
INTRAVENOUS | Status: AC
  Filled 2022-03-14: qty 50

## 2022-03-14 MED ORDER — DEXTROSE-NACL 5-0.9 % IV SOLN: INTRAVENOUS | Status: DC

## 2022-03-14 MED ORDER — DEXTROSE 10 % IV SOLN: INTRAVENOUS | Status: DC

## 2022-03-14 NOTE — ED Notes (Signed)
Pt's inpatient room is now clean and ready. Receiving RN Lorain Childes, RN has agreed to accept [facility] once pt has arrived to inpatient unit, all questions and concerns address.

## 2022-03-14 NOTE — ED Notes (Signed)
[facility] called for  transport. 

## 2022-03-14 NOTE — Progress Notes (Signed)
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 Jack Rock RN making a routine home visit, activated EMS due to patient needing peg tube evaluation to check for potential displacement and the presence of foul smelling drainage. [facility] brought into [facility] ED.  Jack Rivas was admitted to [facility] on 08.28.23 for severe hypoglycemia, bradycardia and weakness. Was transferred to Jack Rivas ED from [facility] ED today, 08.29.23.  Per Dr.Feldmann, ACC MD, this is a related hospital admission.    Visited Jack Rivas at bedside. Patient was laying in bed, alert and pleasantly confused, congestion noted. Spoke with patient's Daughters, Jack Rivas and Jack Rivas at the bedside. Jack Rivas conversation had. Jack Rivas elects at this time to change code status from full code to DNR. She is electing not to resume tube feeding and requests patient be able to eat following diet recommendations from 8.18 assessment: Dysphagia 1 (puree) solids; nectar thick liquid. She requests patient be treated for pain if experiencing with Aleve, does not want patient to have morphine. Requesting SNF placement at discharge as family is unable to care for him at home.   Patient is inpatient appropriate due to hypoglycemia, hypothermia, bradycardia, and agitation (terminal).    V/S:  137/94 bp,  96.7 Temp, 46 bpm, 14 RR, 98% on RA  I&O: not recorded  Labs:  Glucose-Capillary: 51 (L) BASIC METABOLIC PANEL: BUN: 31 (H) Calcium: 8.8 (L) RBC: 3.46 (L) Hemoglobin: 10.2 (L) HCT: 31.3 (L) Platelets: 107 (L) Eosinophils Absolute: 0.8 (H)  Diagnostics:  CT HEAD WITHOUT CONTRAST IMPRESSION: 1. No acute intracranial abnormality. 2. Generalized cerebral atrophy and microvascular disease changes of the supratentorial brain. 3. Chronic nondisplaced left-sided nasal  bone fracture. 4. Paranasal sinus disease, as described above. ABDOMEN-1 precontrast and 2 post-contrast supine views of the abdomen were obtained. IMPRESSION: 1. Precontrast imaging of the abdomen demonstrates oral contrast from the previous study in the descending colon, sigmoid and rectosigmoid. Extensive diverticulosis of the sigmoid colon. 2. Omnipaque contrast is not well delineated in the gastric lumen. Suggest injecting the contrast through the G-tube and evaluating the G-tube under continuous fluoroscopic examination. CT ABDOMEN AND PELVIS WITHOUT CONTRAST IMPRESSION: 1. G-tube in the stomach. No pneumoperitoneum or stranding around the G2. No contrast media in the stomach. 2. Patchy airspace opacities at the lung bases are improved compared to more remote imaging but again compatible with post aspiration change a component of which is likely chronic. Correlate with any ongoing respiratory symptoms. 3. L1 compression fracture is new since September of 2022. Correlate with any pain in this area. 4. Aortic atherosclerosis.   IV/PRN:  dextrose 5 % solution - once, IV x1 0.9 % sodium chloride infusion - 218ml, PRN, IV x1 dextrose 50 % solution 50 mL - 1 ampule, PRN, IV x1    Problem list: Patient has been admitted, awaiting an inpatient bed. Appears to be transitioning or actively dying as evidenced by hypothermia, hypoglycemia, bradycardia, and agitation (terminal).    GOC: Patient code status changed to DNR. Home care RN and Social worker met daughter in the home to further discus code status and goals of care. Spoke with daughters at the bedside to continue goals of care conversation. Jack Rivas made the decision to change code status to DNR.    D/C planning: Ongoing  Family: Family has not been present at bedside. Home care  RN and Education officer, museum meeting daughter in the home to [place] and current code status.    IDT: Updated   Medication list and Transfer Summary  placed on Shadow Chart.   Should patient need ambulance transfer - Please use [facility]) as they contract this service for our active hospice patients.        Zigmund Gottron, RN [facility] Liaison  [phone]

## 2022-03-14 NOTE — ED Provider Notes (Signed)
76 year old male admitted by night team for PEG tube problems, aspiration pneumonia, hypotensive, bradycardic, hypoglycemic.  D5 half-normal started.  Bear hugger and warm blankets applied.  Heart rate stable at 46 to 56 bpm.  Patient has been waiting for a bed for approximately 21 hours here at [facility] med center.  Mended for ED to ED transfer at this time.  We do not have nutritional supplies for PEG tube or do we have the means to hold the patient for this amount of time.  I spoke with the physician Dr. Sharion [facility] who accepts patient as long as they are stable for transfer.  At this time her blood pressure is above 110 systolic, heart rate is stable from 46 to 5 beats per minute, glucose is improving on drip.  I have a concern that patient may be in the active process of dying.  He is currently on hospice care however it seems that there is some social setbacks in the family.  Hospice care is currently working with the family to try to transfer patient directly to hospice as opposed to admitting patient to increase patient's overall comfort.  Will defer to hospice team meeting team for those decisions.  11:52 AM Burna Forts, DO 03/14/22 1153

## 2022-03-14 NOTE — Assessment & Plan Note (Addendum)
Secondary to poor oral intake Patient was on dextrose containing fluids with improvement in his blood sugars Resume tube feeds Check blood sugars every 4 hours Strict aspiration precautions

## 2022-03-14 NOTE — H&P (Addendum)
History and Physical    Patient: Jack Rivas TKW:[id number] DOB: Nov 16, 1945 DOA: 03/13/2022 DOS: the patient was seen and examined on 03/14/2022 PCP: System, Provider Not In  Patient coming from: Home  Chief Complaint:  Chief Complaint  Patient presents with   PEG tube problem   HPI: Jack Rivas is a 76 y.o. male with medical history significant for advanced dementia with behavioral disturbances with complications of dysphagia/adult failure to thrive status post PEG tube placement, moderate malnutrition, sinus bradycardia not a candidate for permanent pacemaker insertion, recent hospitalization for sepsis due to aspiration pneumonia (08/16 - 08/21), currently at home on hospice care who was brought to the ER for evaluation of increased agitation by his caregivers at home who appeared to have increasing difficulty in providing care for the patient at home due to his behavior. Patient was noted to be bradycardic and hypoglycemic and has been on dextrose infusion. I am unable to do a review of systems on this patient due to his underlying dementia.    Review of Systems: Unable to review all systems due to lack of cooperation from patient. Past Medical History:  Diagnosis Date   Dementia (HCC)    Hypertension    Uses feeding tube    Past Surgical History:  Procedure Laterality Date   CATARACT EXTRACTION, BILATERAL     KNEE SURGERY     Social History:  reports that he has been smoking. He has never used smokeless tobacco. He reports that he does not currently use alcohol. He reports that he does not use drugs.  No Known Allergies  No family history on file.  Prior to Admission medications   Medication Sig Start Date End Date Taking? Authorizing Provider  acetaminophen (TYLENOL) 160 MG/5ML suspension Place 10.2 mLs (325 mg total) into feeding tube every 6 (six) hours as needed for mild pain, headache or fever. 03/06/22   Ghimire, Werner Lean, MD  diclofenac Sodium (VOLTAREN) 1 % GEL  Apply 2 g topically 4 (four) times daily. 03/06/22   Ghimire, Werner Lean, MD  haloperidol (HALDOL) 5 MG tablet Take 5 mg by mouth every 4 (four) hours as needed for agitation.    [provider]  LORazepam (ATIVAN) 0.5 MG tablet Place 1 tablet (0.5 mg total) into feeding tube daily. 03/06/22   Ghimire, Werner Lean, MD  Nutritional Supplements (FEEDING SUPPLEMENT, JEVITY 1.5 CAL/FIBER,) LIQD Place 1,000 mLs into feeding tube continuous. At 55 ml/hr 03/06/22   Ghimire, Werner Lean, MD  polyethylene glycol powder (GLYCOLAX/MIRALAX) 17 GM/SCOOP powder Place 17 g into feeding tube daily. 03/06/22   Ghimire, Werner Lean, MD  Protein (FEEDING SUPPLEMENT, PROSOURCE JM42,) liquid Place 60 mLs into feeding tube daily. 03/06/22 06/04/22  GhimireWerner Lean, MD  Water For Irrigation, Sterile (FREE WATER) SOLN Place 200 mLs into feeding tube every 4 (four) hours. 03/06/22 06/04/22  Maretta Bees, MD    Physical Exam: Vitals:   03/14/22 1445 03/14/22 1500 03/14/22 1530 03/14/22 1545  BP: (!) 114/99 123/66 138/68   Pulse: (!) 43 (!) 43 (!) 45 (!) 39  Resp: (!) 9 11 (!) 9 10  Temp:      TempSrc:      SpO2: 99% 98% 98% 99%  Weight:      Height:       Physical Exam Vitals and nursing note reviewed.  Constitutional:      Comments: Awake.  Oriented only to person  HENT:     Head: Normocephalic and atraumatic.  Nose: Nose normal.     Mouth/Throat:     Mouth: Mucous membranes are dry.  Eyes:     Comments: Pale conjunctiva  Cardiovascular:     Rate and Rhythm: Bradycardia present.  Pulmonary:     Effort: Pulmonary effort is normal.     Breath sounds: Normal breath sounds.  Abdominal:     General: Abdomen is flat. Bowel sounds are normal.     Palpations: Abdomen is soft.     Comments: PEG tube in place  Musculoskeletal:     Cervical back: Normal range of motion and neck supple.  Skin:    General: Skin is warm and dry.  Neurological:     Mental Status: He is alert.     Comments: Oriented  only to person.  Unable to follow commands.  Psychiatric:     Comments: Unable to assess     Data Reviewed: Relevant notes from primary care and specialist visits, past discharge summaries as available in EHR, including Care Everywhere. Prior diagnostic testing as pertinent to current admission diagnoses Updated medications and problem lists for reconciliation ED course, including vitals, labs, imaging, treatment and response to treatment Triage notes, nursing and pharmacy notes and ED provider's notes Notable results as noted in HPI Labs reviewed.  White count 4.6, hemoglobin 10.2, hematocrit 31.3, RDW 13.4, platelet count 107, sodium 139, potassium 4.5, chloride 106, bicarb 28, glucose 79, BUN 31, creatinine 1.05, calcium 8.8 CT scan of abdomen and pelvis without contrast shows G-tube in the stomach. No pneumoperitoneum or stranding around the G2. No contrast media in the stomach. Patchy airspace opacities at the lung bases are improved compared to more remote imaging but again compatible with post aspiration change a component of which is likely chronic. Correlate with any ongoing respiratory symptoms. L1 compression fracture is new since September of 2022. Correlate with any pain in this area. Aortic atherosclerosis. Twelve-lead EKG shows multiple artifacts with bradycardia There are no new results to review at this time.  Assessment and Plan: * Hypoglycemia Secondary to poor oral intake Patient was on dextrose containing fluids with improvement in his blood sugars Resume tube feeds Check blood sugars every 4 hours Strict aspiration precautions  Dementia (HCC) Advanced dementia with behavioral disturbances and complications of dysphagia as well as adult failure to thrive status post PEG tube insertion. Patient is currently on hospice Continue lorazepam and Haldol as needed for agitation  Malnutrition of moderate degree Secondary to chronic illness as evidenced by mild fat and  muscle depletion Continue tube feeds Jevity at 55 cc an hour  Junctional bradycardia Chronic Patient evaluated by cardiology during his last hospitalization and is not a candidate for permanent pacemaker insertion or any cardiac intervention due to his severe dementia. Avoid AV nodal blocking agents      Advance Care Planning:   Code Status: DNR   Consults: Palliative care  Family Communication: Greater than 50% of time was spent discussing patient's condition and plan of care with his daughter Rocky Crafts over the phone.  All questions and concerns have been addressed.  She verbalizes understanding and agrees to the plan.  CODE STATUS was discussed and she wants patient placed in a DO NOT RESUSCITATE status.  Severity of Illness: The appropriate patient status for this patient is OBSERVATION. Observation status is judged to be reasonable and necessary in order to provide the required intensity of service to ensure the patient's safety. The patient's presenting symptoms, physical exam findings, and initial radiographic and  laboratory data in the context of their medical condition is felt to place them at decreased risk for further clinical deterioration. Furthermore, it is anticipated that the patient will be medically stable for discharge from the hospital within 2 midnights of admission.   Author: Collier Bullock, MD 03/14/2022 3:50 PM  For on call review [email or web address].

## 2022-03-14 NOTE — ED Notes (Signed)
[facility] on unit to transfer pt to [facility] per MD order.

## 2022-03-14 NOTE — Progress Notes (Signed)
Spoke to [employer], hospital liaison from [facility].  She states that family does not want tube feeds resumed and will want him to have pleasure feeds as tolerated. We will have speech see patient in a.m. to make recommendations about oral intake. We will discontinue tube feeds and place patient on dextrose containing infusions. Blood sugar checks every 4 hours

## 2022-03-14 NOTE — Progress Notes (Signed)
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 Please call with any questions or concerns. Thank you  Dionicio Stall, [facility] Liaison [phone]

## 2022-03-14 NOTE — ED Notes (Signed)
POC Discussed with Hospice nurse French Ana

## 2022-03-14 NOTE — Assessment & Plan Note (Addendum)
Advanced dementia with behavioral disturbances and complications of dysphagia as well as adult failure to thrive status post PEG tube insertion. Patient is currently on hospice Continue lorazepam and Haldol as needed for agitation

## 2022-03-14 NOTE — Progress Notes (Addendum)
Plan of Care Note for accepted transfer   Patient: Jack Rivas MRN: [id number]   DOA: 03/13/2022  Facility requesting transfer: [facility] ED Requesting Provider: Dr. Rush Landmark Reason for transfer: Severe hypoglycemia, bradycardia and weakness Facility course:   76 year old male with past medical history of advanced dementia, severe bradycardia (not a pacemaker candidate per last hospital stay) , severe dysphagia status post PEG tube placement and recent hospitalization at [facility] from 8/16 until 8/21 due to sepsis secondary to aspiration pneumonia complicated by metabolic encephalopathy.  Throughout the patient's recent hospitalization goals of care meetings with the family revealed the family insisted they wanted the full scope of treatment for the patient however they did agree to a hospice referral at time of discharge.  Patient has been receiving hospice services via [facility] since discharge.  Now in the past 24 hours the patient has proven to be increasingly difficult for the family to care over the home becoming profoundly agitated, attempting to get out of bed and falling on numerous occasions.  Patient has presented to [facility] health emergency departments 3 times over the span of time.  Now, upon arrival to [facility] emergency department patient is found to be bradycardic and hypoglycemic all secondary to progressive failure to thrive/advanced dementia and family is insisting that they are unable to care for the patient any further and are requesting hospitalization.  A side note - there was some concern about the patient's PEG tube being dislodged however after EDP got imaging and discussed with radiology it is felt that the PEG is in the correct position.  He remains full code.  We will place a bed request for a telemetry bed with the initial plan to manage patient's ongoing hypoglycemia however ongoing goals of care discussions must be continued with the family with  assistance of palliative care upon arrival as patient's prognosis is exceedingly poor.  Plan of care: The patient is accepted for admission to Telemetry unit, at [facility]..    Author: Marinda Elk, MD 03/14/2022  Check [email or web address] for on-call coverage.  Nursing staff, Please call [facility] Admits & Consults System-Wide number on Amion as soon as patient's arrival, so appropriate admitting provider can evaluate the pt.

## 2022-03-14 NOTE — ED Notes (Signed)
Biar hugger placed on pt

## 2022-03-14 NOTE — Assessment & Plan Note (Signed)
Chronic Patient evaluated by cardiology during his last hospitalization and is not a candidate for permanent pacemaker insertion or any cardiac intervention due to his severe dementia. Avoid AV nodal blocking agents

## 2022-03-14 NOTE — Assessment & Plan Note (Signed)
Secondary to chronic illness as evidenced by mild fat and muscle depletion Continue tube feeds Jevity at 55 cc an hour

## 2022-03-14 NOTE — ED Notes (Signed)
Pt reposition in bed, new/dry diaper applied to pt before transport to inpatient unit

## 2022-03-15 DIAGNOSIS — S32019A Unspecified fracture of first lumbar vertebra, initial encounter for closed fracture: Secondary | ICD-10-CM | POA: Diagnosis present

## 2022-03-15 DIAGNOSIS — X58XXXA Exposure to other specified factors, initial encounter: Secondary | ICD-10-CM | POA: Diagnosis present

## 2022-03-15 DIAGNOSIS — R1310 Dysphagia, unspecified: Secondary | ICD-10-CM | POA: Diagnosis present

## 2022-03-15 DIAGNOSIS — E440 Moderate protein-calorie malnutrition: Secondary | ICD-10-CM

## 2022-03-15 DIAGNOSIS — R627 Adult failure to thrive: Secondary | ICD-10-CM | POA: Diagnosis present

## 2022-03-15 DIAGNOSIS — R001 Bradycardia, unspecified: Secondary | ICD-10-CM | POA: Diagnosis present

## 2022-03-15 DIAGNOSIS — Z79899 Other long term (current) drug therapy: Secondary | ICD-10-CM | POA: Diagnosis not present

## 2022-03-15 DIAGNOSIS — Z515 Encounter for palliative care: Secondary | ICD-10-CM | POA: Diagnosis not present

## 2022-03-15 DIAGNOSIS — Z4659 Encounter for fitting and adjustment of other gastrointestinal appliance and device: Secondary | ICD-10-CM

## 2022-03-15 DIAGNOSIS — F03C18 Unspecified dementia, severe, with other behavioral disturbance: Secondary | ICD-10-CM | POA: Diagnosis present

## 2022-03-15 DIAGNOSIS — E162 Hypoglycemia, unspecified: Secondary | ICD-10-CM | POA: Diagnosis present

## 2022-03-15 DIAGNOSIS — Z66 Do not resuscitate: Secondary | ICD-10-CM | POA: Diagnosis present

## 2022-03-15 DIAGNOSIS — Z6823 Body mass index (BMI) 23.0-23.9, adult: Secondary | ICD-10-CM | POA: Diagnosis not present

## 2022-03-15 DIAGNOSIS — I129 Hypertensive chronic kidney disease with stage 1 through stage 4 chronic kidney disease, or unspecified chronic kidney disease: Secondary | ICD-10-CM | POA: Diagnosis present

## 2022-03-15 DIAGNOSIS — F17200 Nicotine dependence, unspecified, uncomplicated: Secondary | ICD-10-CM | POA: Diagnosis present

## 2022-03-15 DIAGNOSIS — F03C11 Unspecified dementia, severe, with agitation: Secondary | ICD-10-CM | POA: Diagnosis present

## 2022-03-15 DIAGNOSIS — Z431 Encounter for attention to gastrostomy: Secondary | ICD-10-CM | POA: Diagnosis not present

## 2022-03-15 DIAGNOSIS — Z8619 Personal history of other infectious and parasitic diseases: Secondary | ICD-10-CM | POA: Diagnosis not present

## 2022-03-15 DIAGNOSIS — N189 Chronic kidney disease, unspecified: Secondary | ICD-10-CM | POA: Diagnosis present

## 2022-03-15 LAB — GLUCOSE, CAPILLARY
Glucose-Capillary: 81 mg/dL (ref 70–99)
Glucose-Capillary: 94 mg/dL (ref 70–99)
Glucose-Capillary: 81 mg/dL (ref 70–99)
Glucose-Capillary: 138 mg/dL — ABNORMAL HIGH (ref 70–99)
Glucose-Capillary: 82 mg/dL (ref 70–99)

## 2022-03-15 LAB — BASIC METABOLIC PANEL
Anion gap: 5 (ref 5–15)
BUN: 26 mg/dL — ABNORMAL HIGH (ref 8–23)
CO2: 26 mmol/L (ref 22–32)
Calcium: 9.1 mg/dL (ref 8.9–10.3)
Chloride: 108 mmol/L (ref 98–111)
Creatinine, Ser: 1.15 mg/dL (ref 0.61–1.24)
GFR, Estimated: >60 mL/min (ref >60)
Glucose, Bld: 91 mg/dL (ref 70–99)
Potassium: 3.6 mmol/L (ref 3.5–5.1)
Sodium: 139 mmol/L (ref 135–145)

## 2022-03-15 LAB — CBC
HCT: 30.9 % — ABNORMAL LOW (ref 39.0–52.0)
Hemoglobin: 9.8 g/dL — ABNORMAL LOW (ref 13.0–17.0)
MCH: 29.3 pg (ref 26.0–34.0)
MCHC: 31.7 g/dL (ref 30.0–36.0)
MCV: 92.2 fL (ref 80.0–100.0)
Platelets: 114 10*3/uL — ABNORMAL LOW (ref 150–400)
RBC: 3.35 MIL/uL — ABNORMAL LOW (ref 4.22–5.81)
RDW: 13.3 % (ref 11.5–15.5)
WBC: 3.9 10*3/uL — ABNORMAL LOW (ref 4.0–10.5)
nRBC: 0 % (ref 0.0–0.2)

## 2022-03-15 MED ORDER — FOOD THICKENER (SIMPLYTHICK): 1.0000 | ORAL | Status: DC | PRN

## 2022-03-15 NOTE — Progress Notes (Signed)
PROGRESS NOTE    Jack Rivas  [id number] DOB: 04/14/1946 DOA: 03/13/2022 PCP: System, Provider Not In   Brief Narrative:  HPI: Jack Rivas is a 76 y.o. male with medical history significant for advanced dementia with behavioral disturbances with complications of dysphagia/adult failure to thrive status post PEG tube placement, moderate malnutrition, sinus bradycardia not a candidate for permanent pacemaker insertion, recent hospitalization for sepsis due to aspiration pneumonia (08/16 - 08/21), currently at home on hospice care who was brought to the ER for evaluation of increased agitation by his caregivers at home who appeared to have increasing difficulty in providing care for the patient at home due to his behavior. Patient was noted to be bradycardic and hypoglycemic and has been on dextrose infusion. I am unable to do a review of systems on this patient due to his underlying dementia.    Assessment & Plan:   Principal Problem:   Hypoglycemia Active Problems:   Dementia (HCC)   Malnutrition of moderate degree   Junctional bradycardia  Hypoglycemia Secondary to poor oral intake.  Family has decided not to resume tube feeds.  SLP consulted to see how much she can eat.  Family wants him to eat as he pleases.   Dementia (HCC)/moderate degree malnutrition/hospice At his baseline.  Family working with [facility] and hospice to find a place for him to be discharged.  He is stable.  Junctional bradycardia Chronic Patient evaluated by cardiology during his last hospitalization and is not a candidate for permanent pacemaker insertion or any cardiac intervention due to his severe dementia. Avoid AV nodal blocking agents.  Currently he is hospice, DNR.  But not comfort care.  DVT prophylaxis: enoxaparin (LOVENOX) injection 40 mg Start: 03/14/22 2200   Code Status: DNR  Family Communication:  None present at bedside.   Status is: Inpatient Remains inpatient appropriate because: Waiting for  placement.   Estimated body mass index is 23.48 kg/m as calculated from the following:   Height as of this encounter: 6\' 4"  (1.93 m).   Weight as of this encounter: 87.5 kg.    Nutritional Assessment: Body mass index is 23.48 kg/m.Marland Kitchen Seen by dietician.  I agree with the assessment and plan as outlined below: Nutrition Status:        . Skin Assessment: I have examined the patient's skin and I agree with the wound assessment as performed by the wound care RN as outlined below:    Consultants:  None  Procedures:  None  Antimicrobials:  Anti-infectives (From admission, onward)    None         Subjective: Patient seen and examined.  He is alert but not oriented, at his baseline.  He appears comfortable.  Objective: Vitals:   03/15/22 0039 03/15/22 0400 03/15/22 0733 03/15/22 1242  BP: 122/77  118/80 104/61  Pulse: (!) 41  (!) 56 (!) 53  Resp: 15  14 14   Temp: (!) 97.4 F (36.3 C)  (!) 97.4 F (36.3 C) (!) 97.3 F (36.3 C)  TempSrc: Oral  Oral Oral  SpO2: 100%   100%  Weight:  87.5 kg    Height:  6\' 4"  (1.93 m)      Intake/Output Summary (Last 24 hours) at 03/15/2022 1528 Last data filed at 03/15/2022 1400 Gross per 24 hour  Intake 350 ml  Output 250 ml  Net 100 ml   Filed Weights   03/13/22 1350 03/15/22 0400  Weight: 88.4 kg 87.5 kg    Examination:  General exam:  Appears calm and comfortable  Respiratory system: Clear to auscultation. Respiratory effort normal. Cardiovascular system: S1 & S2 heard, RRR. No JVD, murmurs, rubs, gallops or clicks. No pedal edema. Gastrointestinal system: Abdomen is nondistended, soft and nontender. No organomegaly or masses felt. Normal bowel sounds heard. Central nervous system: Alert but not oriented. No focal neurological deficits. Extremities: Symmetric 5 x 5 power.   Data Reviewed: I have personally reviewed following labs and imaging studies  CBC: Recent Labs  Lab 03/12/22 1021 03/13/22 1533  03/15/22 0507  WBC 5.5 4.6 3.9*  NEUTROABS  --  2.2  --   HGB 10.2* 10.2* 9.8*  HCT 32.5* 31.3* 30.9*  MCV 92.3 90.5 92.2  PLT 127* 107* 114*   Basic Metabolic Panel: Recent Labs  Lab 03/12/22 1021 03/13/22 1509 03/15/22 0507  NA 143 139 139  K 4.5 4.5 3.6  CL 103 106 108  CO2 31 28 26   GLUCOSE 78 79 91  BUN 38* 31* 26*  CREATININE 1.61* 1.05 1.15  CALCIUM 9.5 8.8* 9.1   GFR: Estimated Creatinine Clearance: 67.1 mL/min (by C-G formula based on SCr of 1.15 mg/dL). Liver Function Tests: Recent Labs  Lab 03/12/22 1021  AST 47*  ALT 52*  ALKPHOS 76  BILITOT 0.3  PROT 8.3*  ALBUMIN 3.5   No results for input(s): "LIPASE", "AMYLASE" in the last 168 hours. No results for input(s): "AMMONIA" in the last 168 hours. Coagulation Profile: No results for input(s): "INR", "PROTIME" in the last 168 hours. Cardiac Enzymes: No results for input(s): "CKTOTAL", "CKMB", "CKMBINDEX", "TROPONINI" in the last 168 hours. BNP (last 3 results) No results for input(s): "PROBNP" in the last 8760 hours. HbA1C: No results for input(s): "HGBA1C" in the last 72 hours. CBG: Recent Labs  Lab 03/14/22 1801 03/14/22 1945 03/15/22 0448 03/15/22 0720 03/15/22 1113  GLUCAP 63* 121* 81 94 81   Lipid Profile: No results for input(s): "CHOL", "HDL", "LDLCALC", "TRIG", "CHOLHDL", "LDLDIRECT" in the last 72 hours. Thyroid Function Tests: No results for input(s): "TSH", "T4TOTAL", "FREET4", "T3FREE", "THYROIDAB" in the last 72 hours. Anemia Panel: No results for input(s): "VITAMINB12", "FOLATE", "FERRITIN", "TIBC", "IRON", "RETICCTPCT" in the last 72 hours. Sepsis Labs: No results for input(s): "PROCALCITON", "LATICACIDVEN" in the last 168 hours.  Recent Results (from the past 240 hour(s))  Resp Panel by RT-PCR (Flu A&B, Covid) Anterior Nasal Swab     Status: None   Collection Time: 03/12/22 10:24 AM   Specimen: Anterior Nasal Swab  Result Value Ref Range Status   SARS Coronavirus 2 by RT  PCR NEGATIVE NEGATIVE Final    Comment: (NOTE) SARS-CoV-2 target nucleic acids are NOT DETECTED.  The SARS-CoV-2 RNA is generally detectable in upper respiratory specimens during the acute phase of infection. The lowest concentration of SARS-CoV-2 viral copies this assay can detect is 138 copies/mL. A negative result does not preclude SARS-Cov-2 infection and should not be used as the sole basis for treatment or other patient management decisions. A negative result may occur with  improper specimen collection/handling, submission of specimen other than nasopharyngeal swab, presence of viral mutation(s) within the areas targeted by this assay, and inadequate number of viral copies(<138 copies/mL). A negative result must be combined with clinical observations, patient history, and epidemiological information. The expected result is Negative.  Fact Sheet for Patients:  03/14/22  Fact Sheet for Healthcare Providers:  BloggerCourse.com  This test is no t yet approved or cleared by the [employer]iousBroker.it and  has been authorized for  detection and/or diagnosis of SARS-CoV-2 by FDA under an Emergency Use Authorization (EUA). This EUA will remain  in effect (meaning this test can be used) for the duration of the COVID-19 declaration under Section 564(b)(1) of the Act, 21 U.S.C.section 360bbb-3(b)(1), unless the authorization is terminated  or revoked sooner.       Influenza A by PCR NEGATIVE NEGATIVE Final   Influenza B by PCR NEGATIVE NEGATIVE Final    Comment: (NOTE) The Xpert Xpress SARS-CoV-2/FLU/RSV plus assay is intended as an aid in the diagnosis of influenza from Nasopharyngeal swab specimens and should not be used as a sole basis for treatment. Nasal washings and aspirates are unacceptable for Xpert Xpress SARS-CoV-2/FLU/RSV testing.  Fact Sheet for Patients: EntrepreneurPulse.com.au  Fact Sheet for  Healthcare Providers: IncredibleEmployment.be  This test is not yet approved or cleared by the [place] FDA and has been authorized for detection and/or diagnosis of SARS-CoV-2 by FDA under an Emergency Use Authorization (EUA). This EUA will remain in effect (meaning this test can be used) for the duration of the COVID-19 declaration under Section 564(b)(1) of the Act, 21 U.S.C. section 360bbb-3(b)(1), unless the authorization is terminated or revoked.  Performed at [facility], [place] [address], [place], [place] [address]      Radiology Studies: CT ABDOMEN PELVIS WO CONTRAST  Result Date: 03/13/2022 CLINICAL DATA:  Question of G-tube dysfunction. EXAM: CT ABDOMEN AND PELVIS WITHOUT CONTRAST TECHNIQUE: Multidetector CT imaging of the abdomen and pelvis was performed following the standard protocol without IV contrast. RADIATION DOSE REDUCTION: This exam was performed according to the departmental dose-optimization program which includes automated exposure control, adjustment of the mA and/or kV according to patient size and/or use of iterative reconstruction technique. COMPARISON:  Abdominal radiographs of the same date. CT of the chest of October 24, 2021 FINDINGS: Lower chest: Patchy airspace opacities at the lung bases. These are improved compared to more remote imaging but again compatible with post aspiration change a component of which is likely chronic. No dense consolidative process. Hepatobiliary: Liver with smooth contours. No pericholecystic stranding. Pancreas: Pancreatic atrophy without signs of inflammation. Spleen: Normal. Adrenals/Urinary Tract: Adrenal glands are normal. Smooth renal contours without hydronephrosis. No ureteral dilation or nephrolithiasis. Stomach/Bowel: G-tube in the stomach. No stranding along the tract. No pneumoperitoneum. No contrast media is present in the stomach. No contrast media is present in the small bowel aside  from mildly dilute contrast media perhaps administered four attempt at G-tube evaluation earlier. There is dense contrast media in the colon from the patient's previous swallow evaluation. This is mainly in the distal colon. No acute colonic or small bowel process.  The appendix is normal. Vascular/Lymphatic: Aortic atherosclerosis.  No aneurysmal dilation. No adenopathy in the pelvis or in the abdomen. Reproductive: Unremarkable by CT. Other: No pneumoperitoneum.  No ascites. Musculoskeletal: Heterotopic ossification about the bilateral hips. Spinal degenerative changes. Interval compression fracture at L1 with mild 10% to 20% loss of height without surrounding stranding has occurred since September of 2022. IMPRESSION: 1. G-tube in the stomach. No pneumoperitoneum or stranding around the G2. No contrast media in the stomach. 2. Patchy airspace opacities at the lung bases are improved compared to more remote imaging but again compatible with post aspiration change a component of which is likely chronic. Correlate with any ongoing respiratory symptoms. 3. L1 compression fracture is new since September of 2022. Correlate with any pain in this area. 4. Aortic atherosclerosis. Aortic Atherosclerosis (ICD10-I70.0). Electronically Signed   By:  Donzetta Kohut M.D.   On: 03/13/2022 20:40   DG Abd 1 View  Result Date: 03/13/2022 CLINICAL DATA:  [id number]; peg tube problem, check placement. EXAM: ABDOMEN-1 precontrast and 2 post-contrast supine views of the abdomen were obtained. Contrast: 50 mL of Omnipaque COMPARISON:  January 25, 2022 FINDINGS: The precontrast supine view of the abdomen demonstrates oral contrast opacifying the descending colon, sigmoid and rectosigmoid. Extensive diverticulosis of the sigmoid colon. The tip of the G-tube appears to project at the region of the body of the stomach. Contrast is not well delineated in the gastric lumen. Bowel-gas pattern is nonobstructive. IMPRESSION: 1. Precontrast imaging  of the abdomen demonstrates oral contrast from the previous study in the descending colon, sigmoid and rectosigmoid. Extensive diverticulosis of the sigmoid colon. 2. Omnipaque contrast is not well delineated in the gastric lumen. Suggest injecting the contrast through the G-tube and evaluating the G-tube under continuous fluoroscopic examination. Electronically Signed   By: Marjo Bicker M.D.   On: 03/13/2022 18:23    Scheduled Meds:  enoxaparin (LOVENOX) injection  40 mg Subcutaneous Q24H   free water  200 mL Per Tube Q4H   LORazepam  0.5 mg Per Tube Daily   polyethylene glycol  17 g Per Tube Daily   sodium chloride flush  3 mL Intravenous Q12H   Continuous Infusions:  sodium chloride 250 mL (03/14/22 1126)   dextrose 50 mL/hr at 03/15/22 1400     LOS: 0 days   Hughie Closs, MD [facility]  03/15/2022, 3:28 PM   *Please note that this is a verbal dictation therefore any spelling or grammatical errors are due to the "Dragon Medical One" system interpretation.  Please page via Amion and do not message via secure chat for urgent patient care matters. Secure chat can be used for non urgent patient care matters.  How to contact the [facility] Attending or Consulting provider 7A - 7P or covering provider during after hours 7P -7A, for this patient?  Check the care team in [facility] and look for a) attending/consulting TRH provider listed and b) the [facility] team listed. Page or secure chat 7A-7P. Log into [email or web address] and use [place]'s universal password to access. If you do not have the password, please contact the hospital operator. Locate the [facility] provider you are looking for under [facility] and page to a number that you can be directly reached. If you still have difficulty reaching the provider, please page the [facility] (Director on Call) for the Hospitalists listed on amion for assistance.

## 2022-03-15 NOTE — NC FL2 (Deleted)
[place] MEDICAID FL2 LEVEL OF CARE SCREENING TOOL     IDENTIFICATION  Patient Name: Jack Rivas Birthdate: 04/13/1946 Sex: male Admission Date (Current Location): 03/13/2022  [facility] and [place] Number:  Producer, television/film/video and Address:  [facility],  [address], [place] [address]      Provider Number: [id number]  Attending Physician Name and Address:  Hughie Closs, MD  Relative Name and Phone Number:       Current Level of Care: Hospital Recommended Level of Care: Skilled Nursing Facility Prior Approval Number:    Date Approved/Denied:   PASRR Number: [id number]  Discharge Plan: SNF    Current Diagnoses: Patient Active Problem List   Diagnosis Date Noted   Septic shock due to undetermined organism (HCC) 03/01/2022   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   Debility 10/25/2021   Hypothermia 10/25/2021   Hypoglycemia 10/25/2021   Acute kidney injury superimposed on chronic kidney disease (HCC) 10/25/2021   Anemia of chronic disease 10/23/2021   Altered mental status 05/10/2021   HCAP (healthcare-associated pneumonia) 04/20/2021   Chronic kidney disease, stage 3a (HCC) 04/20/2021   Bradycardia 04/20/2021   Severe sepsis (HCC) 04/20/2021   Malnutrition of moderate degree 03/21/2021   Aspiration pneumonia (HCC) 03/20/2021   Dementia (HCC) 03/20/2021   Dysphagia 03/20/2021    Orientation RESPIRATION BLADDER Height & Weight     Self, Time, Situation, Place  Normal Continent Weight: 87.5 kg Height:  6\' 4"  (193 cm)  BEHAVIORAL SYMPTOMS/MOOD NEUROLOGICAL BOWEL NUTRITION STATUS      Continent Diet (regular)  AMBULATORY STATUS COMMUNICATION OF NEEDS Skin   Extensive Assist Verbally Normal                       Personal Care Assistance Level of Assistance  Bathing, Feeding, Dressing Bathing Assistance: Limited assistance Feeding assistance: Limited assistance Dressing Assistance: Limited assistance     Functional Limitations Info   Sight, Hearing, Speech Sight Info: Adequate Hearing Info: Adequate Speech Info: Adequate    SPECIAL CARE FACTORS FREQUENCY  PT (By licensed PT), OT (By licensed OT)     PT Frequency: 5x weekly OT Frequency: 5 x weekly            Contractures Contractures Info: Not present    Additional Factors Info  Code Status Code Status Info: DNR             Current Medications (03/15/2022):  This is the current hospital active medication list Current Facility-Administered Medications  Medication Dose Route Frequency Provider Last Rate Last Admin   0.9 %  sodium chloride infusion  250 mL Intravenous PRN 03/17/2022, MD 10 mL/hr at 03/14/22 1126 250 mL at 03/14/22 1126   acetaminophen (TYLENOL) 160 MG/5ML suspension 325 mg  325 mg Per Tube Q6H PRN Agbata, Tochukwu, MD       dextrose 10 % infusion   Intravenous Continuous Agbata, Tochukwu, MD 50 mL/hr at 03/14/22 1827 New Bag at 03/14/22 1827   dextrose 50 % solution 50 mL  1 ampule Intravenous PRN Molpus, Schneider, MD   50 mL at 03/14/22 1826   enoxaparin (LOVENOX) injection 40 mg  40 mg Subcutaneous Q24H Agbata, Tochukwu, MD   40 mg at 03/14/22 2124   free water 200 mL  200 mL Per Tube Q4H Agbata, Tochukwu, MD   200 mL at 03/15/22 0820   haloperidol (HALDOL) tablet 5 mg  5 mg Per Tube Q4H PRN 03/17/22, MD  LORazepam (ATIVAN) tablet 0.5 mg  0.5 mg Per Tube Daily Agbata, Tochukwu, MD   0.5 mg at 03/15/22 0949   ondansetron (ZOFRAN) tablet 4 mg  4 mg Oral Q6H PRN Agbata, Tochukwu, MD       Or   ondansetron (ZOFRAN) injection 4 mg  4 mg Intravenous Q6H PRN Agbata, Tochukwu, MD       polyethylene glycol (MIRALAX / GLYCOLAX) packet 17 g  17 g Per Tube Daily Agbata, Tochukwu, MD   17 g at 03/15/22 0952   sodium chloride flush (NS) 0.9 % injection 3 mL  3 mL Intravenous Q12H Ernie Avena, MD   3 mL at 03/13/22 2127   sodium chloride flush (NS) 0.9 % injection 3 mL  3 mL Intravenous PRN Ernie Avena, MD         Discharge  Medications: Please see discharge summary for a list of discharge medications.  Relevant Imaging Results:  Relevant Lab Results:   Additional Information SS#[id number]   Hospice patient [facility]  has a g tube .  Golda Acre, RN

## 2022-03-15 NOTE — Progress Notes (Addendum)
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 ACC RN making a routine home visit, activated EMS due to patient needing peg tube evaluation to check for potential displacement and the presence of foul smelling drainage. Patient brought into [facility] ED.  Jack Rivas was admitted to [facility] on 08.28.23 for severe hypoglycemia, bradycardia and weakness. Was transferred to [name] ED from [facility] ED today, 08.29.23.  Per Dr.Feldmann, ACC MD, this is a related hospital admission.    Visited [name] at bedside. Patient was laying in bed, sleeping upon entering the room, then awoke once we were in the room. Patient was alert and pleasantly confused. Dextrose 10 % infusion currently running at 47ml/hr. Spoke with patient's Daughter, [name] via phone call to provide update and answer questions. Family is requesting SNF placement at discharge as family is unable to care for him at home.   Patient is inpatient appropriate due to hypoglycemia.   V/S:  118/80 bp,  97.4 Temp, 56 bpm, 14 RR, 100% on RA   I&O: 0/250    Labs:  BASIC METABOLIC PANEL:  BUN: 26 (H) WBC: 3.9 (L) RBC: 3.35 (L) Hemoglobin: 9.8 (L) HCT: 30.9 (L) Platelets: 114 (L)    Diagnostics: none today   IV/PRN:  dextrose 10 % infusion - 61ml/hr, continuous, IV x1  dextrose 50 % solution 50 mL - 1 ampule, PRN, IV x1     Problem list: Assessment and Plan: * Hypoglycemia Secondary to poor oral intake Patient was on dextrose containing fluids with improvement in his blood sugars Resume tube feeds Check blood sugars every 4 hours Strict aspiration precautions   Dementia (HCC) Advanced dementia with behavioral disturbances and complications of dysphagia as well as adult failure to thrive status post PEG tube insertion. Patient is  currently on hospice Continue lorazepam and Haldol as needed for agitation   Malnutrition of moderate degree Secondary to chronic illness as evidenced by mild fat and muscle depletion Continue tube feeds Jevity at 55 cc an hour   Junctional bradycardia Chronic Patient evaluated by cardiology during his last hospitalization and is not a candidate for permanent pacemaker insertion or any cardiac intervention due to his severe dementia. Avoid AV nodal blocking agents    GOC: Patient is now a DNR.      D/C planning: Ongoing, currently awaiting facility placement.     Family: Spoke to daughter, [name], via phone call. Provided update and answered questions.    IDT: Updated   Medication list and Transfer Summary placed on Shadow Chart.    Should patient need ambulance transfer - Please use [facility]) as they contract this service for our active hospice patients.        Lamount Cranker, RN [facility] Liaison  [phone]

## 2022-03-15 NOTE — Progress Notes (Signed)
                                                     Palliative Care Progress Note   Patient Name: Jack Rivas       Date: 03/15/2022 DOB: [birth year]  Age: 76 y.o. MRN#: [id number] Attending Physician: Hughie Closs, MD Primary Care Physician: System, Provider Not In Admit Date: 03/13/2022  Consult received and reviewed.  Patient is currently being followed by [facility].  Goals of care discussions have already occurred and patient/family's goals, wishes, and plans have been made clear with hospice liaisons.   As per discussions with Georgia Duff and Allie Dimmer of [facility], no further PMT intervention needed at this time.  PMT will shadow the chart and remain available to patient/family/medical team as needed during his hospitalization.   Thank you for allowing the Palliative Medicine Team to assist in the care of Kaelin Bonelli.  Samara Deist L. Manon Hilding, FNP-BC Palliative Medicine Team Team Phone # [phone]  NO CHARGE

## 2022-03-15 NOTE — TOC Progression Note (Signed)
Transition of Care [facility]) - Progression Note    Patient Details  Name: Jack Rivas MRN: [id number] Date of Birth: October 21, 1945  Transition of Care [facility]) CM/SW Contact  Golda Acre, RN Phone Number: 03/15/2022, 11:07 AM  Clinical Narrative:    [facility] sent out for placement for this hospice patient.   Expected Discharge Plan: Home/Self Care Barriers to Discharge: Continued Medical Work up  Expected Discharge Plan and Services Expected Discharge Plan: Home/Self Care   Discharge Planning Services: CM Consult   Living arrangements for the past 2 months: Single Family Home                                       Social Determinants of Health (SDOH) Interventions    Readmission Risk Interventions   Row Labels 03/28/2021    3:34 PM  Readmission Risk Prevention Plan   Section Header. No data exists in this row.   Transportation Screening   Complete  PCP or Specialist Appt within 3-5 Days   Complete  HRI or Home Care Consult   Complete  Social Work Consult for Recovery Care Planning/Counseling   Complete  Palliative Care Screening   Not Applicable  Medication Review Oceanographer)   Complete

## 2022-03-15 NOTE — [place] FL2 (Signed)
[place] MEDICAID FL2 LEVEL OF CARE SCREENING TOOL     IDENTIFICATION  Patient Name: Jack Rivas Birthdate: 1945-09-02 Sex: male Admission Date (Current Location): 03/13/2022  [facility] and [place] Number:  Producer, television/film/video and Address:  [facility],  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   Debility 10/25/2021   Hypothermia 10/25/2021   Hypoglycemia 10/25/2021   Acute kidney injury superimposed on chronic kidney disease (HCC) 10/25/2021   Anemia of chronic disease 10/23/2021   Altered mental status 05/10/2021   HCAP (healthcare-associated pneumonia) 04/20/2021   Chronic kidney disease, stage 3a (HCC) 04/20/2021   Bradycardia 04/20/2021   Severe sepsis (HCC) 04/20/2021   Malnutrition of moderate degree 03/21/2021   Aspiration pneumonia (HCC) 03/20/2021   Dementia (HCC) 03/20/2021   Dysphagia 03/20/2021    Orientation RESPIRATION BLADDER Height & Weight     Self, Place  Normal Continent Weight: 87.5 kg Height:  6\' 4"  (193 cm)  BEHAVIORAL SYMPTOMS/MOOD NEUROLOGICAL BOWEL NUTRITION STATUS      Continent Feeding tube (has g tube)  AMBULATORY STATUS COMMUNICATION OF NEEDS Skin   Extensive Assist Verbally Normal                       Personal Care Assistance Level of Assistance  Bathing, Feeding, Dressing Bathing Assistance: Maximum assistance Feeding assistance: Maximum assistance Dressing Assistance: Maximum assistance     Functional Limitations Info  Sight,  Hearing, Speech Sight Info: Adequate Hearing Info: Adequate Speech Info: Adequate    SPECIAL CARE FACTORS FREQUENCY  PT (By licensed PT), OT (By licensed OT)     PT Frequency: 5x weekly OT Frequency: 5 x weekly            Contractures Contractures Info: Not present    Additional Factors Info  Code Status Code Status Info: DNR             Current Medications (03/15/2022):  This is the current hospital active medication list Current Facility-Administered Medications  Medication Dose Route Frequency Provider Last Rate Last Admin   0.9 %  sodium chloride infusion  250 mL Intravenous PRN 03/17/2022, MD 10 mL/hr at 03/14/22 1126 250 mL at 03/14/22 1126   acetaminophen (TYLENOL) 160 MG/5ML suspension 325 mg  325 mg Per Tube Q6H PRN Agbata, Tochukwu, MD       dextrose 10 % infusion   Intravenous Continuous Agbata, Tochukwu, MD 50 mL/hr at 03/14/22 1827 New Bag at 03/14/22 1827   dextrose 50 % solution 50 mL  1 ampule Intravenous PRN Molpus, Rastus, MD   50 mL at 03/14/22 1826   enoxaparin (LOVENOX) injection 40 mg  40 mg Subcutaneous Q24H Agbata, Tochukwu, MD   40 mg at 03/14/22 2124   free water 200 mL  200 mL Per Tube Q4H Agbata, Tochukwu, MD   200 mL at 03/15/22 0820   haloperidol (HALDOL) tablet 5 mg  5 mg Per Tube Q4H PRN 03/17/22, MD  LORazepam (ATIVAN) tablet 0.5 mg  0.5 mg Per Tube Daily Agbata, Tochukwu, MD   0.5 mg at 03/15/22 0949   ondansetron (ZOFRAN) tablet 4 mg  4 mg Oral Q6H PRN Agbata, Tochukwu, MD       Or   ondansetron (ZOFRAN) injection 4 mg  4 mg Intravenous Q6H PRN Agbata, Tochukwu, MD       polyethylene glycol (MIRALAX / GLYCOLAX) packet 17 g  17 g Per Tube Daily Agbata, Tochukwu, MD   17 g at 03/15/22 0952   sodium chloride flush (NS) 0.9 % injection 3 mL  3 mL Intravenous Q12H Ernie Avena, MD   3 mL at 03/13/22 2127   sodium chloride flush (NS) 0.9 % injection 3 mL  3 mL Intravenous PRN Ernie Avena, MD         Discharge  Medications: Please see discharge summary for a list of discharge medications.  Relevant Imaging Results:  Relevant Lab Results:   Additional Information SS#[id number]   Hospice patient authrocare collective  Golda Acre, RN

## 2022-03-15 NOTE — TOC Progression Note (Signed)
Transition of Care [facility]) - Progression Note    Patient Details  Name: Jack Rivas MRN: [id number] Date of Birth: Jul 20, 1945  Transition [facility] Care [facility]) CM/SW Contact  Golda Acre, RN Phone Number: 03/15/2022, 1:41 PM  Clinical Narrative:    Tct-daughter- wants to look at two of the three snf that have made bed offers.  [name] will call back with decision.   Expected Discharge Plan: Home/Self Care Barriers to Discharge: Continued Medical Work up  Expected Discharge Plan and Services Expected Discharge Plan: Home/Self Care   Discharge Planning Services: CM Consult   Living arrangements for the past 2 months: Single Family Home                                       Social Determinants of Health (SDOH) Interventions    Readmission Risk Interventions   Row Labels 03/28/2021    3:34 PM  Readmission Risk Prevention Plan   Section Header. No data exists in this row.   Transportation Screening   Complete  PCP or Specialist Appt within 3-5 Days   Complete  HRI or Home Care Consult   Complete  Social Work Consult for Recovery Care Planning/Counseling   Complete  Palliative Care Screening   Not Applicable  Medication Review Oceanographer)   Complete

## 2022-03-15 NOTE — Evaluation (Addendum)
Clinical/Bedside Swallow Evaluation Patient Details  Name: Jack Rivas MRN: [id number] Date of Birth: May 27, 1946  Today's Date: 03/15/2022 Time: SLP Start Time (ACUTE ONLY): 1345 SLP Stop Time (ACUTE ONLY): 1417 SLP Time Calculation (min) (ACUTE ONLY): 32 min  Past Medical History:  Past Medical History:  Diagnosis Date   Dementia (HCC)    Hypertension    Uses feeding tube    Past Surgical History:  Past Surgical History:  Procedure Laterality Date   CATARACT EXTRACTION, BILATERAL     KNEE SURGERY     HPI:  Pt is a 76 y/o male readmaitted to hospital with PEG tube probelms.  Pt known to SLP staff from multiple prior evaluations - he is under hospice care and is a DNR now.  Recent admit with sepsis and suspected aspiration pneumonie early August 2023.  PMH: dementia, HTN, dysphagia, aspiration PNA, G-tube, pt currently on hospice. MBS 10/27/21: moderate oropharyngeal dysphagia with Impaired oral control. intermittent oral retention, a pharyngeal delay, penetration (thin and nectar via straw) and aspiration. A dysphagia 1 diet with nectar thick liquids via cup was recommended, repeat MBS 02/2022 with same recommendations.  Swallow eval ordered.  Imaging study 03/14/2022 showed "Patchy airspace opacities at the lung bases are improved compared to more remote imaging but again compatible with post aspiration  change a component of which is likely chronic. Correlate with any  ongoing respiratory symptoms." No family present during evaluation.    Assessment / Plan / Recommendation  Clinical Impression  Pt familiar to this SLP from prior admit in April 2023 and early August 2023.   Today he demonstrates consistent oral/mandibular movement - ? consistent with tardive dyskinesia.  Pt provided with single ice chips, Italian ice, Ensure and tsps of cranberry juice.  No indication of aspiration - has h/o silent aspiration.   Wet voice and congested non-productive cough on secretions noted. Wet voice  inconsistently clears but recurs within minutes.  Pt remains very impulsive taking very large sequential boluses, therefore he will require full supervision with po.  Recommend return to dys1/nectaar diet - allow tsps of thin water and single ice chips.  Medicine with puree - whole.    Swallow is inconsistently delayed - and on MBS liquids spilled to pyriform prior to swallow trigger - which coupled with weak cough, increases his asp pna risk.  Will follow briefly for family education, tolerance, perhaps if able to go to residential hospice, advancement to allow solid foods requiring mastication may improve pt's QOL.  SLP will follow up with family for education including chronicity of aspiration, mitigation strategies and impact on pt's QOL.   SLP Visit Diagnosis: Dysphagia, oropharyngeal phase (R13.12)    Aspiration Risk  Moderate aspiration risk    Diet Recommendation Dysphagia 1 (Puree);Nectar-thick liquid (tsps thin water ok, single ice chips ok)   Liquid Administration via: No straw;Cup;Spoon Medication Administration: Whole meds with puree Supervision: Full supervision/cueing for compensatory strategies Compensations: Slow rate;Small sips/bites Postural Changes: Seated upright at 90 degrees;Remain upright for at least 30 minutes after po intake    Other  Recommendations Oral Care Recommendations: Oral care BID Other Recommendations: Order thickener from pharmacy;Have oral suction available    Recommendations for follow up therapy are one component of a multi-disciplinary discharge planning process, led by the attending physician.  Recommendations may be updated based on patient status, additional functional criteria and insurance authorization.  Follow up Recommendations Follow physician's recommendations for discharge plan and follow up therapies      Assistance  Recommended at Discharge Frequent or constant Supervision/Assistance  Functional Status Assessment Patient has had a  recent decline in their functional status and/or demonstrates limited ability to make significant improvements in function in a reasonable and predictable amount of time  Frequency and Duration min 1 x/week  1 week       Prognosis Prognosis for Safe Diet Advancement: Guarded Barriers to Reach Goals: Time post onset;Behavior      Swallow Study   General Date of Onset: Mar 19, 2022 HPI: Pt is a 76 y/o male readmaitted to hospital with PEG tube probelms.  Pt known to SLP staff from multiple prior evaluations - he is under hospice care and is a DNR now.  Recent admit with sepsis and suspected aspiration pneumonie early August 2023.  PMH: dementia, HTN, dysphagia, aspiration PNA, G-tube, pt currently on hospice. MBS 10/27/21: moderate oropharyngeal dysphagia with Impaired oral control. intermittent oral retention, a pharyngeal delay, penetration (thin and nectar via straw) and aspiration. A dysphagia 1 diet with nectar thick liquids via cup was recommended, repeat MBS 02/2022 with same recommendations.  Swallow eval ordered.  Imaging study 03/14/2022 showed "Patchy airspace opacities at the lung bases are improved compared to more remote imaging but again compatible with post aspiration  change a component of which is likely chronic. Correlate with any  ongoing respiratory symptoms." No family present during evaluation. Type of Study: Bedside Swallow Evaluation Previous Swallow Assessment: See HPI Diet Prior to this Study: NPO Temperature Spikes Noted: No Respiratory Status: Room air History of Recent Intubation: No Behavior/Cognition: Alert;Cooperative;Impulsive;Doesn't follow directions Oral Cavity Assessment: Other (comment) (pt did not open mouth to allow view) Oral Care Completed by SLP: No Oral Cavity - Dentition: Adequate natural dentition;Missing dentition Vision: Functional for self-feeding Self-Feeding Abilities: Needs assist (due to impulsivity) Patient Positioning: Upright in bed Baseline  Vocal Quality: Wet Volitional Cough: Cognitively unable to elicit Volitional Swallow: Unable to elicit    Oral/Motor/Sensory Function Overall Oral Motor/Sensory Function:  (pt is dysarthric, did not follow directions for OME)   Ice Chips Ice chips: Within functional limits Presentation: Spoon   Thin Liquid Thin Liquid: Within functional limits Presentation: Spoon Pharyngeal  Phase Impairments: Suspected delayed Swallow Other Comments: dnt cup or straw due to h/o aspiration pna, silent asp of thin    Nectar Thick Nectar Thick Liquid: Within functional limits Presentation: Cup;Spoon Pharyngeal Phase Impairments: Suspected delayed Swallow   Honey Thick Honey Thick Liquid: Not tested   Puree Puree: Within functional limits   Solid     Solid: Not tested Other Comments: due to prior recommendation of puree foods after several studies      Chales Abrahams March 19, 2022,2:46 PM   Rolena Infante, MS [facility] SLP Acute Rehab Services Office [phone] Pager [phone]

## 2022-03-15 NOTE — TOC Initial Note (Signed)
Transition of Care [facility]) - Initial/Assessment Note    Patient Details  Name: Jack Rivas MRN: [id number] Date of Birth: 1946-05-07  Transition of Care [facility]) CM/SW Contact:    Golda Acre, RN Phone Number: 03/15/2022, 7:14 AM  Clinical Narrative:                  Transition of Care [facility]) Screening Note   Patient Details  Name: Jack Rivas Date of Birth: Dec 01, 1945   Transition of Care [facility]) CM/SW Contact:    Golda Acre, RN Phone Number: 03/15/2022, 7:15 AM    Transition of Care Department [facility]) has reviewed patient and no TOC needs have been identified at this time. We will continue to monitor patient advancement through interdisciplinary progression rounds. If new patient transition needs arise, please place a TOC consult.    Expected Discharge Plan: Home/Self Care Barriers to Discharge: Continued Medical Work up   Patient Goals and CMS Choice Patient states their goals for this hospitalization and ongoing recovery are:: to go home CMS Medicare.gov Compare Post Acute Care list provided to:: Patient Choice offered to / list presented to : Patient  Expected Discharge Plan and Services Expected Discharge Plan: Home/Self Care   Discharge Planning Services: CM Consult   Living arrangements for the past 2 months: Single Family Home                                      Prior Living Arrangements/Services Living arrangements for the past 2 months: Single Family Home Lives with:: Self Patient language and need for interpreter reviewed:: Yes Do you feel safe going back to the place where you live?: Yes            Criminal Activity/Legal Involvement Pertinent to Current Situation/Hospitalization: No - Comment as needed  Activities of Daily Living Home Assistive Devices/Equipment: CBG Meter ADL Screening (condition at time of admission) Patient's cognitive ability adequate to safely complete daily activities?: No Is the patient deaf or have  difficulty hearing?: No Does the patient have difficulty seeing, even when wearing glasses/contacts?: No Does the patient have difficulty concentrating, remembering, or making decisions?: Yes Patient able to express need for assistance with ADLs?: No Does the patient have difficulty dressing or bathing?: Yes Independently performs ADLs?: No Communication: Independent Is this a change from baseline?: Pre-admission baseline Dressing (OT): Dependent Is this a change from baseline?: Pre-admission baseline Grooming: Dependent Is this a change from baseline?: Pre-admission baseline Feeding: Dependent Is this a change from baseline?: Pre-admission baseline Bathing: Dependent Is this a change from baseline?: Pre-admission baseline Toileting: Dependent Is this a change from baseline?: Pre-admission baseline In/Out Bed: Dependent Is this a change from baseline?: Pre-admission baseline Walks in Home: Dependent Is this a change from baseline?: Pre-admission baseline Does the patient have difficulty walking or climbing stairs?: Yes Weakness of Legs: Both Weakness of Arms/Hands: Both  Permission Sought/Granted                  Emotional Assessment Appearance:: Appears stated age Attitude/Demeanor/Rapport: Engaged Affect (typically observed): Calm Orientation: : Oriented to Self, Oriented to Place, Oriented to  Time, Oriented to Situation Alcohol / Substance Use: Tobacco Use (active smoker) Psych Involvement: No (comment)  Admission diagnosis:  Hypoglycemia [E16.2] Encounter for care related to feeding tube [Z46.59] Patient Active Problem List   Diagnosis Date Noted   Septic shock due to undetermined organism (HCC) 03/01/2022  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   Debility 10/25/2021   Hypothermia 10/25/2021   Hypoglycemia 10/25/2021   Acute kidney injury superimposed on chronic kidney disease (HCC) 10/25/2021   Anemia of chronic disease 10/23/2021   Altered mental status  05/10/2021   HCAP (healthcare-associated pneumonia) 04/20/2021   Chronic kidney disease, stage 3a (HCC) 04/20/2021   Bradycardia 04/20/2021   Severe sepsis (HCC) 04/20/2021   Malnutrition of moderate degree 03/21/2021   Aspiration pneumonia (HCC) 03/20/2021   Dementia (HCC) 03/20/2021   Dysphagia 03/20/2021   PCP:  System, Provider Not In Pharmacy:   [id number] - [place], [place] - [address] AT [facility] [address]  [place] [address] Phone: [phone] Fax: [phone]     Social Determinants of Health (SDOH) Interventions    Readmission Risk Interventions   Row Labels 03/28/2021    3:34 PM  Readmission Risk Prevention Plan   Section Header. No data exists in this row.   Transportation Screening   Complete  PCP or Specialist Appt within 3-5 Days   Complete  HRI or Home Care Consult   Complete  Social Work Consult for Recovery Care Planning/Counseling   Complete  Palliative Care Screening   Not Applicable  Medication Review Oceanographer)   Complete

## 2022-03-16 DIAGNOSIS — E162 Hypoglycemia, unspecified: Secondary | ICD-10-CM | POA: Diagnosis not present

## 2022-03-16 LAB — GLUCOSE, CAPILLARY
Glucose-Capillary: 86 mg/dL (ref 70–99)
Glucose-Capillary: 91 mg/dL (ref 70–99)
Glucose-Capillary: 94 mg/dL (ref 70–99)

## 2022-03-16 MED ORDER — LORAZEPAM 0.5 MG PO TABS: 0.5000 mg | ORAL_TABLET | Freq: Every day | ORAL | 0 refills | Status: DC

## 2022-03-16 NOTE — Progress Notes (Signed)
Patient being discharged to [facility] today. Report called to the facility [facility] LPN accepting report. VSS and no acute changed noted in Patient's assessment at time of discharge. Discharge AVS with the patient at time of discharge.

## 2022-03-16 NOTE — TOC Progression Note (Addendum)
Transition of Care [facility]) - Progression Note    Patient Details  Name: Jack Rivas MRN: [id number] Date of Birth: 03-08-1946  Transition of Care [facility]) CM/SW Contact  Golda Acre, RN Phone Number: 03/16/2022, 8:24 AM  Clinical Narrative:    [name] the daughter/have chosen [facility] for snf [placement. Tct-[facility]-patient may come today. Will alert the md.  Expected Discharge Plan: Home/Self Care Barriers to Discharge: Continued Medical Work up  Expected Discharge Plan and Services Expected Discharge Plan: Home/Self Care   Discharge Planning Services: CM Consult   Living arrangements for the past 2 months: Single Family Home                                       Social Determinants of Health (SDOH) Interventions    Readmission Risk Interventions   Row Labels 03/28/2021    3:34 PM  Readmission Risk Prevention Plan   Section Header. No data exists in this row.   Transportation Screening   Complete  PCP or Specialist Appt within 3-5 Days   Complete  HRI or Home Care Consult   Complete  Social Work Consult for Recovery Care Planning/Counseling   Complete  Palliative Care Screening   Not Applicable  Medication Review Oceanographer)   Complete

## 2022-03-16 NOTE — TOC Transition Note (Signed)
Transition of Care [facility]) - CM/SW Discharge Note   Patient Details  Name: Deloy Archey MRN: [id number] Date of Birth: 1945-10-27  Transition of Care [facility]) CM/SW Contact:  Golda Acre, RN Phone Number: 03/16/2022, 10:38 AM   Clinical Narrative:    Patient transferred to [facility] via ptar   Final next level of care: Skilled Nursing Facility Barriers to Discharge: No Barriers Identified   Patient Goals and CMS Choice Patient states their goals for this hospitalization and ongoing recovery are:: family want in snf with hospice CMS Medicare.gov Compare Post Acute Care list provided to:: Patient Represenative (must comment) (daughter) Choice offered to / list presented to : Adult Children  Discharge Placement                       Discharge Plan and Services   Discharge Planning Services: CM Consult Post Acute Care Choice: Skilled Nursing Facility                               Social Determinants of Health (SDOH) Interventions     Readmission Risk Interventions   Row Labels 03/28/2021    3:34 PM  Readmission Risk Prevention Plan   Section Header. No data exists in this row.   Transportation Screening   Complete  PCP or Specialist Appt within 3-5 Days   Complete  HRI or Home Care Consult   Complete  Social Work Consult for Recovery Care Planning/Counseling   Complete  Palliative Care Screening   Not Applicable  Medication Review Oceanographer)   Complete

## 2022-03-16 NOTE — Discharge Summary (Signed)
PatientPhysician Discharge Summary  Jack Rivas [id number] DOB: 07-Jan-1946 DOA: 03/13/2022  PCP: System, Provider Not In  Admit date: 03/13/2022 Discharge date: 03/16/2022 30 Day Unplanned Readmission Risk Score    Flowsheet Row ED to Hosp-Admission (Current) from 03/13/2022 in [place]  30 Day Unplanned Readmission Risk Score (%) 37.52 Filed at 03/16/2022 0801       This score is the patient's risk of an unplanned readmission within 30 days of being discharged (0 -100%). The score is based on dignosis, age, lab data, medications, orders, and past utilization.   Low:  0-14.9   Medium: 15-21.9   High: 22-29.9   Extreme: 30 and above          Admitted From: Home Disposition: SNF as hospice  Recommendations for Outpatient Follow-up:  Follow up with PCP in 1-2 weeks Please obtain BMP/CBC in one week Please follow up with your PCP on the following pending results: Unresulted Labs (From admission, onward)     Start     Ordered   03/21/22 0500  Creatinine, serum  (enoxaparin (LOVENOX)    CrCl >/= 30 ml/min)  Weekly,   R     Comments: while on enoxaparin therapy    03/14/22 [place]: None Equipment/Devices: None  Discharge Condition: Fair CODE STATUS: DNR Diet recommendation: Dysphagia 1 diet  Subjective: Seen and examined.  Alert but not oriented, at his baseline due to advanced dementia.  Brief/Interim Summary: Jack Rivas is a 76 y.o. male with medical history significant for advanced dementia with behavioral disturbances with complications of dysphagia/adult failure to thrive status post PEG tube placement, moderate malnutrition, sinus bradycardia not a candidate for permanent pacemaker insertion, recent hospitalization for sepsis due to aspiration pneumonia (08/16 - 08/21), currently at home on hospice care who was brought to the ER for evaluation of increased agitation by his caregivers at home who  appeared to have increasing difficulty in providing care for the patient at home due to his behavior. Patient was noted to be bradycardic and hypoglycemic and was started on dextrose infusion and admitted under hospital service for mainly placement.  Hospice was consulted.  Details below.  Hypoglycemia Secondary to poor oral intake.  Family has decided not to resume tube feeds.  SLP consulted and they recommended dysphagia 1 diet.  Dextrose stopped this morning.   Dementia (HCC)/moderate degree malnutrition/hospice At his baseline.  Family worked with TOC and they have found placement for him.  He is being discharged today.   Junctional bradycardia Chronic Patient evaluated by cardiology during his last hospitalization and is not a candidate for permanent pacemaker insertion or any cardiac intervention due to his severe dementia. Avoid AV nodal blocking agents.  Currently he is hospice, DNR.  But not comfort care.  Discharge plan was discussed with patient and/or family member and they verbalized understanding and agreed with it.  Discharge Diagnoses:  Principal Problem:   Hypoglycemia Active Problems:   Dementia (HCC)   Malnutrition of moderate degree   Junctional bradycardia    Discharge Instructions   Allergies as of 03/16/2022   No Known Allergies      Medication List     STOP taking these medications    haloperidol 5 MG tablet Commonly known as: HALDOL       TAKE these medications    atropine 1 % ophthalmic solution Place 4 drops under the tongue  every 4 (four) hours as needed (secretions).   diclofenac Sodium 1 % Gel Commonly known as: Voltaren Apply 2 g topically 4 (four) times daily.   feeding supplement (JEVITY 1.5 CAL/FIBER) Liqd Place 1,000 mLs into feeding tube continuous. At 55 ml/hr   feeding supplement (PROSource TF20) liquid Place 60 mLs into feeding tube daily.   free water Soln Place 200 mLs into feeding tube every 4 (four) hours.    LORazepam 0.5 MG tablet Commonly known as: ATIVAN Place 1 tablet (0.5 mg total) into feeding tube daily.   polyethylene glycol powder 17 GM/SCOOP powder Commonly known as: GLYCOLAX/MIRALAX Place 17 g into feeding tube daily.   SM Pain & Fever Childrens 160 MG/5ML suspension Generic drug: acetaminophen Place 10.2 mLs (325 mg total) into feeding tube every 6 (six) hours as needed for mild pain, headache or fever.   valproic acid 250 MG/5ML solution Commonly known as: DEPAKENE Take 250 mg by mouth at bedtime.        No Known Allergies  Consultations: None   Procedures/Studies: CT ABDOMEN PELVIS WO CONTRAST  Result Date: 03/13/2022 CLINICAL DATA:  Question of G-tube dysfunction. EXAM: CT ABDOMEN AND PELVIS WITHOUT CONTRAST TECHNIQUE: Multidetector CT imaging of the abdomen and pelvis was performed following the standard protocol without IV contrast. RADIATION DOSE REDUCTION: This exam was performed according to the departmental dose-optimization program which includes automated exposure control, adjustment of the mA and/or kV according to patient size and/or use of iterative reconstruction technique. COMPARISON:  Abdominal radiographs of the same date. CT of the chest of October 24, 2021 FINDINGS: Lower chest: Patchy airspace opacities at the lung bases. These are improved compared to more remote imaging but again compatible with post aspiration change a component of which is likely chronic. No dense consolidative process. Hepatobiliary: Liver with smooth contours. No pericholecystic stranding. Pancreas: Pancreatic atrophy without signs of inflammation. Spleen: Normal. Adrenals/Urinary Tract: Adrenal glands are normal. Smooth renal contours without hydronephrosis. No ureteral dilation or nephrolithiasis. Stomach/Bowel: G-tube in the stomach. No stranding along the tract. No pneumoperitoneum. No contrast media is present in the stomach. No contrast media is present in the small bowel aside  from mildly dilute contrast media perhaps administered four attempt at G-tube evaluation earlier. There is dense contrast media in the colon from the patient's previous swallow evaluation. This is mainly in the distal colon. No acute colonic or small bowel process.  The appendix is normal. Vascular/Lymphatic: Aortic atherosclerosis.  No aneurysmal dilation. No adenopathy in the pelvis or in the abdomen. Reproductive: Unremarkable by CT. Other: No pneumoperitoneum.  No ascites. Musculoskeletal: Heterotopic ossification about the bilateral hips. Spinal degenerative changes. Interval compression fracture at L1 with mild 10% to 20% loss of height without surrounding stranding has occurred since September of 2022. IMPRESSION: 1. G-tube in the stomach. No pneumoperitoneum or stranding around the G2. No contrast media in the stomach. 2. Patchy airspace opacities at the lung bases are improved compared to more remote imaging but again compatible with post aspiration change a component of which is likely chronic. Correlate with any ongoing respiratory symptoms. 3. L1 compression fracture is new since September of 2022. Correlate with any pain in this area. 4. Aortic atherosclerosis. Aortic Atherosclerosis (ICD10-I70.0). Electronically Signed   By: Zetta Bills M.D.   On: 03/13/2022 20:40   DG Abd 1 View  Result Date: 03/13/2022 CLINICAL DATA:  280330; peg tube problem, check placement. EXAM: ABDOMEN-1 precontrast and 2 post-contrast supine views of the abdomen were obtained. Contrast:  50 mL of Omnipaque COMPARISON:  January 25, 2022 FINDINGS: The precontrast supine view of the abdomen demonstrates oral contrast opacifying the descending colon, sigmoid and rectosigmoid. Extensive diverticulosis of the sigmoid colon. The Jack of the G-tube appears to project at the region of the body of the stomach. Contrast is not well delineated in the gastric lumen. Bowel-gas pattern is nonobstructive. IMPRESSION: 1. Precontrast imaging  of the abdomen demonstrates oral contrast from the previous study in the descending colon, sigmoid and rectosigmoid. Extensive diverticulosis of the sigmoid colon. 2. Omnipaque contrast is not well delineated in the gastric lumen. Suggest injecting the contrast through the G-tube and evaluating the G-tube under continuous fluoroscopic examination. Electronically Signed   By: Frazier Richards M.D.   On: 03/13/2022 18:23   CT Head Wo Contrast  Result Date: 03/13/2022 CLINICAL DATA:  Status post fall. EXAM: CT HEAD WITHOUT CONTRAST TECHNIQUE: Contiguous axial images were obtained from the base of the skull through the vertex without intravenous contrast. RADIATION DOSE REDUCTION: This exam was performed according to the departmental dose-optimization program which includes automated exposure control, adjustment of the mA and/or kV according to patient size and/or use of iterative reconstruction technique. COMPARISON:  March 01, 2022 FINDINGS: Brain: There is mild cerebral atrophy with widening of the extra-axial spaces and ventricular dilatation. There are areas of decreased attenuation within the white matter tracts of the supratentorial brain, consistent with microvascular disease changes. Vascular: There is marked severity calcification of the bilateral cavernous carotid arteries. Skull: A chronic nondisplaced left-sided nasal bone fracture is seen. Sinuses/Orbits: Mild left maxillary sinus and mild bilateral ethmoid sinus mucosal thickening is seen. Marked severity right maxillary sinus and sphenoid sinus mucosal thickening is also noted. Other: None. IMPRESSION: 1. No acute intracranial abnormality. 2. Generalized cerebral atrophy and microvascular disease changes of the supratentorial brain. 3. Chronic nondisplaced left-sided nasal bone fracture. 4. Paranasal sinus disease, as described above. Electronically Signed   By: Virgina Norfolk M.D.   On: 03/13/2022 03:04   DG Chest 1 View  Result Date:  03/12/2022 CLINICAL DATA:  Cough. EXAM: CHEST  1 VIEW COMPARISON:  March 01, 2022. FINDINGS: The heart size and mediastinal contours are within normal limits. Both lungs are clear. Status post left shoulder arthroplasty. IMPRESSION: No active disease. Electronically Signed   By: Marijo Conception M.D.   On: 03/12/2022 09:57   DG Swallowing Func-Speech Pathology  Result Date: 03/03/2022 Table formatting from the original result was not included. Objective Swallowing Evaluation: Type of Study: MBS-Modified Barium Swallow Study  Patient Details Name: Jack Rivas MRN: [id number] Date of Birth: May 16, 1946 Today's Date: 03/03/2022 Time: SLP Start Time (ACUTE ONLY): 33 -SLP Stop Time (ACUTE ONLY): 1306 SLP Time Calculation (min) (ACUTE ONLY): 28 min Past Medical History: Past Medical History: Diagnosis Date  Dementia ([place])   Hypertension   Uses feeding tube  Past Surgical History: Past Surgical History: Procedure Laterality Date  CATARACT EXTRACTION, BILATERAL    KNEE SURGERY   HPI: Pt is a 76 y/o male who presented with AMS. He was admitted with sepsis and suspected aspiration pneumonia. CXR on admission negative for active disease. PMH: dementia, HTN, dysphagia, aspiration PNA, G-tube, pt currently on hospice. MBS 10/27/21: moderate oropharyngeal dysphagia with Impaired oral control. intermittent oral retention, a pharyngeal delay, penetration (thin and nectar via straw) and aspiration. A dysphagia 1 diet with nectar thick liquids via cup was recommended.  Subjective: pt awake in bed, dysarthric  Recommendations for follow up therapy are one component  of a multi-disciplinary discharge planning process, led by the attending physician.  Recommendations may be updated based on patient status, additional functional criteria and insurance authorization. Assessment / Plan / Recommendation   03/03/2022   4:39 PM Clinical Impressions Clinical Impression Despite repositioning, the view of the larynx was partially obscured by the  pt's shoulders. Pt presents with oropharyngeal dysphagia characterized by reduced bolus cohesion, a pharyngeal delay, weak lingual manipulation, and reduction in tongue base retraction, hyolaryngeal elevation, anterior laryngeal movement, and epiglottic inversion. He demonstrated lingual residue, vallecular residue, reduced laryngeal closure, and pyriform sinus residue. Penetration (PAS 3,5) and silent aspiration (PAS 8) were noted with thin liquids and with nectar thick liquids via straw. Pt was inconsistently able to demonstrate prompted coughing which was ineffective in expelling aspirated material. No penetration/aspiration were noted with honey thick liquids, but increased pharyngeal residue was noted with this consistency. Despite multiple attempts, pt was unable to swallow a 31mm barium tablet that was embedded in puree, so it was ultimately removed. Pt's swallow function appears similar to that noted in April. A dysphagia 1 diet with nectar thick liquids is recommended at this time with strict observance of swallowing precautions including avoidance of straws. SLP will continue to follow pt. SLP Visit Diagnosis Dysphagia, oropharyngeal phase (R13.12) Impact on safety and function Moderate aspiration risk;Mild aspiration risk     03/03/2022   4:39 PM Treatment Recommendations Treatment Recommendations Therapy as outlined in treatment plan below     03/03/2022   4:39 PM Prognosis Prognosis for Safe Diet Advancement Guarded Barriers to Reach Goals Time post onset;Behavior;Severity of deficits;Cognitive deficits   03/03/2022   4:39 PM Diet Recommendations SLP Diet Recommendations Dysphagia 1 (Puree) solids;Nectar thick liquid Liquid Administration via Cup;No straw Medication Administration Crushed with puree Compensations Slow rate;Small sips/bites Postural Changes Seated upright at 90 degrees;Remain semi-upright after after feeds/meals (Comment)     03/03/2022   4:39 PM Other Recommendations Other Recommendations  Order thickener from pharmacy;Have oral suction available Follow Up Recommendations Follow physician's recommendations for discharge plan and follow up therapies Assistance recommended at discharge Frequent or constant Supervision/Assistance Functional Status Assessment Patient has had a recent decline in their functional status and/or demonstrates limited ability to make significant improvements in function in a reasonable and predictable amount of time   03/03/2022   4:39 PM Frequency and Duration  Speech Therapy Frequency (ACUTE ONLY) min 1 x/week Treatment Duration 1 week     03/03/2022  12:38 PM Oral Phase Oral Phase Impaired Oral - Honey Cup Decreased bolus cohesion;Premature spillage;Weak lingual manipulation;Lingual/palatal residue Oral - Nectar Cup Decreased bolus cohesion;Premature spillage;Weak lingual manipulation;Lingual/palatal residue Oral - Nectar Straw Decreased bolus cohesion;Premature spillage;Weak lingual manipulation;Lingual/palatal residue Oral - Thin Cup Decreased bolus cohesion;Premature spillage;Weak lingual manipulation;Lingual/palatal residue Oral - Thin Straw Decreased bolus cohesion;Premature spillage;Weak lingual manipulation;Lingual/palatal residue Oral - Puree Decreased bolus cohesion;Premature spillage;Weak lingual manipulation;Lingual/palatal residue Oral - Mech Soft NT Oral - Regular Weak lingual manipulation;Lingual/palatal residue;Impaired mastication    03/03/2022   4:39 PM Pharyngeal Phase Pharyngeal Material enters airway, remains ABOVE vocal cords then ejected out Pharyngeal Material enters airway, passes BELOW cords without attempt by patient to eject out (silent aspiration);Material enters airway, passes BELOW cords and not ejected out despite cough attempt by patient Pharyngeal Material enters airway, passes BELOW cords without attempt by patient to eject out (silent aspiration) Pharyngeal Material enters airway, passes BELOW cords without attempt by patient to eject out  (silent aspiration)    03/03/2022  12:38 PM Cervical Esophageal  Phase  Cervical Esophageal Phase [facility] [place] Shanika I. Hardin Negus, [place], [place] Office number [phone] Horton Marshall 03/03/2022, 4:41 PM                     CT HEAD WO CONTRAST  Result Date: 03/01/2022 CLINICAL DATA:  Altered mental status. EXAM: CT HEAD WITHOUT CONTRAST CT CERVICAL SPINE WITHOUT CONTRAST TECHNIQUE: Multidetector CT imaging of the head and cervical spine was performed following the standard protocol without intravenous contrast. Multiplanar CT image reconstructions of the cervical spine were also generated. RADIATION DOSE REDUCTION: This exam was performed according to the departmental dose-optimization program which includes automated exposure control, adjustment of the mA and/or kV according to patient size and/or use of iterative reconstruction technique. COMPARISON:  May 10, 2021. FINDINGS: CT HEAD FINDINGS Brain: No evidence of acute infarction, hemorrhage, hydrocephalus, extra-axial collection or mass lesion/mass effect. Vascular: No hyperdense vessel or unexpected calcification. Skull: Normal. Negative for fracture or focal lesion. Sinuses/Orbits: Complete opacification of right maxillary sinus and right sphenoid sinus consistent with sinusitis. Other: None. CT CERVICAL SPINE FINDINGS Alignment: Mild grade 1 anterolisthesis of C7-T1 is noted secondary to posterior facet joint hypertrophy. Skull base and vertebrae: No acute fracture. There is increased left superior articular process of C3 as well as involving the left-sided articular process of C2, most consistent with significantly worsening degenerative change, although focal infection cannot be excluded. Soft tissues and spinal canal: No prevertebral fluid or swelling. No visible canal hematoma. Disc levels: Severe degenerative disc disease is noted at C3-4, C4-5 C6-7 and C7-T1. Status post surgical interbody fusion of C5-6. Upper chest:  Negative. Other: None. IMPRESSION: No acute intracranial abnormality is noted. Probable acute right maxillary and sphenoid sinusitis is noted. No fracture is noted in the cervical spine. Multilevel degenerative changes are noted. There is noted increased destruction of the adjacent articular processes of the left-sided C2-3 facet joint, most consistent with worsening degenerative joint disease, although infection cannot be excluded. MRI may be performed for further evaluation. Electronically Signed   By: Marijo Conception M.D.   On: 03/01/2022 15:06   CT Cervical Spine Wo Contrast  Result Date: 03/01/2022 CLINICAL DATA:  Altered mental status. EXAM: CT HEAD WITHOUT CONTRAST CT CERVICAL SPINE WITHOUT CONTRAST TECHNIQUE: Multidetector CT imaging of the head and cervical spine was performed following the standard protocol without intravenous contrast. Multiplanar CT image reconstructions of the cervical spine were also generated. RADIATION DOSE REDUCTION: This exam was performed according to the departmental dose-optimization program which includes automated exposure control, adjustment of the mA and/or kV according to patient size and/or use of iterative reconstruction technique. COMPARISON:  May 10, 2021. FINDINGS: CT HEAD FINDINGS Brain: No evidence of acute infarction, hemorrhage, hydrocephalus, extra-axial collection or mass lesion/mass effect. Vascular: No hyperdense vessel or unexpected calcification. Skull: Normal. Negative for fracture or focal lesion. Sinuses/Orbits: Complete opacification of right maxillary sinus and right sphenoid sinus consistent with sinusitis. Other: None. CT CERVICAL SPINE FINDINGS Alignment: Mild grade 1 anterolisthesis of C7-T1 is noted secondary to posterior facet joint hypertrophy. Skull base and vertebrae: No acute fracture. There is increased left superior articular process of C3 as well as involving the left-sided articular process of C2, most consistent with significantly  worsening degenerative change, although focal infection cannot be excluded. Soft tissues and spinal canal: No prevertebral fluid or swelling. No visible canal hematoma. Disc levels: Severe degenerative disc disease is noted at C3-4, C4-5 C6-7 and C7-T1. Status post surgical interbody fusion  of C5-6. Upper chest: Negative. Other: None. IMPRESSION: No acute intracranial abnormality is noted. Probable acute right maxillary and sphenoid sinusitis is noted. No fracture is noted in the cervical spine. Multilevel degenerative changes are noted. There is noted increased destruction of the adjacent articular processes of the left-sided C2-3 facet joint, most consistent with worsening degenerative joint disease, although infection cannot be excluded. MRI may be performed for further evaluation. Electronically Signed   By: Marijo Conception M.D.   On: 03/01/2022 15:06   DG Chest Portable 1 View  Result Date: 03/01/2022 CLINICAL DATA:  Weakness EXAM: PORTABLE CHEST 1 VIEW COMPARISON:  Chest x-ray dated October 23, 2021; chest CT dated October 24, 2021 FINDINGS: The heart size and mediastinal contours are within normal limits. Unchanged unchanged calcification overlying the left mid lung, correlate with soft tissue calcification on prior chest CT. Both lungs are clear. The visualized skeletal structures are unremarkable. IMPRESSION: No active disease. Electronically Signed   By: Yetta Glassman M.D.   On: 03/01/2022 13:30     Discharge Exam: Vitals:   03/15/22 2200 03/16/22 0546  BP:  (!) 143/79  Pulse:  (!) 108  Resp:  17  Temp:  (!) 97.5 F (36.4 C)  SpO2: 98% 96%   Vitals:   03/15/22 1242 03/15/22 2110 03/15/22 2200 03/16/22 0546  BP: 104/61 128/77  (!) 143/79  Pulse: (!) 53 (!) 54  (!) 108  Resp: 14 20  17   Temp: (!) 97.3 F (36.3 C) 98.2 F (36.8 C)  (!) 97.5 F (36.4 C)  TempSrc: Oral Oral  Oral  SpO2: 100% (!) 86% 98% 96%  Weight:      Height:        General: Pt is alert, awake, not in acute  distress Cardiovascular: RRR, S1/S2 +, no rubs, no gallops Respiratory: CTA bilaterally, no wheezing, no rhonchi Abdominal: Soft, NT, ND, bowel sounds + Extremities: no edema, no cyanosis    The results of significant diagnostics from this hospitalization (including imaging, microbiology, ancillary and laboratory) are listed below for reference.     Microbiology: Recent Results (from the past 240 hour(s))  Resp Panel by RT-PCR (Flu A&B, Covid) Anterior Nasal Swab     Status: None   Collection Time: 03/12/22 10:24 AM   Specimen: Anterior Nasal Swab  Result Value Ref Range Status   SARS Coronavirus 2 by RT PCR NEGATIVE NEGATIVE Final    Comment: (NOTE) SARS-CoV-2 target nucleic acids are NOT DETECTED.  The SARS-CoV-2 RNA is generally detectable in upper respiratory specimens during the acute phase of infection. The lowest concentration of SARS-CoV-2 viral copies this assay can detect is 138 copies/mL. A negative result does not preclude SARS-Cov-2 infection and should not be used as the sole basis for treatment or other patient management decisions. A negative result may occur with  improper specimen collection/handling, submission of specimen other than nasopharyngeal swab, presence of viral mutation(s) within the areas targeted by this assay, and inadequate number of viral copies(<138 copies/mL). A negative result must be combined with clinical observations, patient history, and epidemiological information. The expected result is Negative.  Fact Sheet for Patients:  EntrepreneurPulse.com.au  Fact Sheet for Healthcare Providers:  IncredibleEmployment.be  This test is no t yet approved or cleared by the [place] FDA and  has been authorized for detection and/or diagnosis of SARS-CoV-2 by FDA under an Emergency Use Authorization (EUA). This EUA will remain  in effect (meaning this test can be used) for the duration of  the COVID-19  declaration under Section 564(b)(1) of the Act, 21 U.S.C.section 360bbb-3(b)(1), unless the authorization is terminated  or revoked sooner.       Influenza A by PCR NEGATIVE NEGATIVE Final   Influenza B by PCR NEGATIVE NEGATIVE Final    Comment: (NOTE) The Xpert Xpress SARS-CoV-2/FLU/RSV plus assay is intended as an aid in the diagnosis of influenza from Nasopharyngeal swab specimens and should not be used as a sole basis for treatment. Nasal washings and aspirates are unacceptable for Xpert Xpress SARS-CoV-2/FLU/RSV testing.  Fact Sheet for Patients: BloggerCourse.com  Fact Sheet for Healthcare Providers: SeriousBroker.it  This test is not yet approved or cleared by the [place] FDA and has been authorized for detection and/or diagnosis of SARS-CoV-2 by FDA under an Emergency Use Authorization (EUA). This EUA will remain in effect (meaning this test can be used) for the duration of the COVID-19 declaration under Section 564(b)(1) of the Act, 21 U.S.C. section 360bbb-3(b)(1), unless the authorization is terminated or revoked.  Performed at [facility], [address], [place], [place] [address]      Labs: BNP (last 3 results) Recent Labs    05/10/21 1219  BNP 53.6   Basic Metabolic Panel: Recent Labs  Lab 03/12/22 1021 03/13/22 1509 03/15/22 0507  NA 143 139 139  K 4.5 4.5 3.6  CL 103 106 108  CO2 31 28 26   GLUCOSE 78 79 91  BUN 38* 31* 26*  CREATININE 1.61* 1.05 1.15  CALCIUM 9.5 8.8* 9.1   Liver Function Tests: Recent Labs  Lab 03/12/22 1021  AST 47*  ALT 52*  ALKPHOS 76  BILITOT 0.3  PROT 8.3*  ALBUMIN 3.5   No results for input(s): "LIPASE", "AMYLASE" in the last 168 hours. No results for input(s): "AMMONIA" in the last 168 hours. CBC: Recent Labs  Lab 03/12/22 1021 03/13/22 1533 03/15/22 0507  WBC 5.5 4.6 3.9*  NEUTROABS  --  2.2  --   HGB 10.2* 10.2* 9.8*  HCT  32.5* 31.3* 30.9*  MCV 92.3 90.5 92.2  PLT 127* 107* 114*   Cardiac Enzymes: No results for input(s): "CKTOTAL", "CKMB", "CKMBINDEX", "TROPONINI" in the last 168 hours. BNP: Invalid input(s): "POCBNP" CBG: Recent Labs  Lab 03/15/22 1557 03/15/22 2111 03/16/22 0040 03/16/22 0542 03/16/22 0726  GLUCAP 138* 82 91 94 86   D-Dimer No results for input(s): "DDIMER" in the last 72 hours. Hgb A1c No results for input(s): "HGBA1C" in the last 72 hours. Lipid Profile No results for input(s): "CHOL", "HDL", "LDLCALC", "TRIG", "CHOLHDL", "LDLDIRECT" in the last 72 hours. Thyroid function studies No results for input(s): "TSH", "T4TOTAL", "T3FREE", "THYROIDAB" in the last 72 hours.  Invalid input(s): "FREET3" Anemia work up No results for input(s): "VITAMINB12", "FOLATE", "FERRITIN", "TIBC", "IRON", "RETICCTPCT" in the last 72 hours. Urinalysis    Component Value Date/Time   COLORURINE YELLOW 03/01/2022 1336   APPEARANCEUR CLEAR 03/01/2022 1336   LABSPEC 1.016 03/01/2022 1336   PHURINE 6.0 03/01/2022 1336   GLUCOSEU NEGATIVE 03/01/2022 1336   HGBUR NEGATIVE 03/01/2022 1336   BILIRUBINUR NEGATIVE 03/01/2022 1336   KETONESUR NEGATIVE 03/01/2022 1336   PROTEINUR NEGATIVE 03/01/2022 1336   NITRITE NEGATIVE 03/01/2022 1336   LEUKOCYTESUR NEGATIVE 03/01/2022 1336   Sepsis Labs Recent Labs  Lab 03/12/22 1021 03/13/22 1533 03/15/22 0507  WBC 5.5 4.6 3.9*   Microbiology Recent Results (from the past 240 hour(s))  Resp Panel by RT-PCR (Flu A&B, Covid) Anterior Nasal Swab     Status:  None   Collection Time: 03/12/22 10:24 AM   Specimen: Anterior Nasal Swab  Result Value Ref Range Status   SARS Coronavirus 2 by RT PCR NEGATIVE NEGATIVE Final    Comment: (NOTE) SARS-CoV-2 target nucleic acids are NOT DETECTED.  The SARS-CoV-2 RNA is generally detectable in upper respiratory specimens during the acute phase of infection. The lowest concentration of SARS-CoV-2 viral copies this  assay can detect is 138 copies/mL. A negative result does not preclude SARS-Cov-2 infection and should not be used as the sole basis for treatment or other patient management decisions. A negative result may occur with  improper specimen collection/handling, submission of specimen other than nasopharyngeal swab, presence of viral mutation(s) within the areas targeted by this assay, and inadequate number of viral copies(<138 copies/mL). A negative result must be combined with clinical observations, patient history, and epidemiological information. The expected result is Negative.  Fact Sheet for Patients:  BloggerCourse.com  Fact Sheet for Healthcare Providers:  SeriousBroker.it  This test is no t yet approved or cleared by the [place] FDA and  has been authorized for detection and/or diagnosis of SARS-CoV-2 by FDA under an Emergency Use Authorization (EUA). This EUA will remain  in effect (meaning this test can be used) for the duration of the COVID-19 declaration under Section 564(b)(1) of the Act, 21 U.S.C.section 360bbb-3(b)(1), unless the authorization is terminated  or revoked sooner.       Influenza A by PCR NEGATIVE NEGATIVE Final   Influenza B by PCR NEGATIVE NEGATIVE Final    Comment: (NOTE) The Xpert Xpress SARS-CoV-2/FLU/RSV plus assay is intended as an aid in the diagnosis of influenza from Nasopharyngeal swab specimens and should not be used as a sole basis for treatment. Nasal washings and aspirates are unacceptable for Xpert Xpress SARS-CoV-2/FLU/RSV testing.  Fact Sheet for Patients: BloggerCourse.com  Fact Sheet for Healthcare Providers: SeriousBroker.it  This test is not yet approved or cleared by the [place] FDA and has been authorized for detection and/or diagnosis of SARS-CoV-2 by FDA under an Emergency Use Authorization (EUA). This EUA will  remain in effect (meaning this test can be used) for the duration of the COVID-19 declaration under Section 564(b)(1) of the Act, 21 U.S.C. section 360bbb-3(b)(1), unless the authorization is terminated or revoked.  Performed at [facility], [address] [place], [place] [address]      Time coordinating discharge: Over 30 minutes  SIGNED:   Hughie Closs, MD  [facility] 03/16/2022, 9:50 AM *Please note that this is a verbal dictation therefore any spelling or grammatical errors are due to the "Dragon [employer] One" system interpretation. If 7PM-7AM, please contact night-coverage [email or web address]

## 2022-04-07 ENCOUNTER — Other Ambulatory Visit: Payer: Self-pay

## 2022-04-07 ENCOUNTER — Observation Stay (HOSPITAL_COMMUNITY)
Admission: EM | Admit: 2022-04-07 | Discharge: 2022-04-08 | Disposition: A | Discharge status: Skilled Nursing Facility | Payer: Medicare Other | Attending: Family Medicine | Admitting: Family Medicine

## 2022-04-07 ENCOUNTER — Encounter (HOSPITAL_COMMUNITY): Payer: Self-pay | Admitting: Internal Medicine

## 2022-04-07 ENCOUNTER — Emergency Department (HOSPITAL_COMMUNITY): Payer: Medicare Other

## 2022-04-07 DIAGNOSIS — Z20822 Contact with and (suspected) exposure to covid-19: Secondary | ICD-10-CM | POA: Insufficient documentation

## 2022-04-07 DIAGNOSIS — F0390 Unspecified dementia without behavioral disturbance: Secondary | ICD-10-CM | POA: Diagnosis present

## 2022-04-07 DIAGNOSIS — R1310 Dysphagia, unspecified: Secondary | ICD-10-CM | POA: Insufficient documentation

## 2022-04-07 DIAGNOSIS — Z931 Gastrostomy status: Secondary | ICD-10-CM | POA: Insufficient documentation

## 2022-04-07 DIAGNOSIS — N1832 Chronic kidney disease, stage 3b: Secondary | ICD-10-CM | POA: Diagnosis present

## 2022-04-07 DIAGNOSIS — Z79899 Other long term (current) drug therapy: Secondary | ICD-10-CM | POA: Insufficient documentation

## 2022-04-07 DIAGNOSIS — F03C0 Unspecified dementia, severe, without behavioral disturbance, psychotic disturbance, mood disturbance, and anxiety: Secondary | ICD-10-CM | POA: Insufficient documentation

## 2022-04-07 DIAGNOSIS — X58XXXA Exposure to other specified factors, initial encounter: Secondary | ICD-10-CM | POA: Diagnosis not present

## 2022-04-07 DIAGNOSIS — E440 Moderate protein-calorie malnutrition: Secondary | ICD-10-CM | POA: Insufficient documentation

## 2022-04-07 DIAGNOSIS — D631 Anemia in chronic kidney disease: Secondary | ICD-10-CM | POA: Diagnosis not present

## 2022-04-07 DIAGNOSIS — D638 Anemia in other chronic diseases classified elsewhere: Secondary | ICD-10-CM | POA: Diagnosis not present

## 2022-04-07 DIAGNOSIS — F0150 Vascular dementia without behavioral disturbance: Secondary | ICD-10-CM | POA: Diagnosis not present

## 2022-04-07 DIAGNOSIS — F17200 Nicotine dependence, unspecified, uncomplicated: Secondary | ICD-10-CM | POA: Diagnosis not present

## 2022-04-07 DIAGNOSIS — J690 Pneumonitis due to inhalation of food and vomit: Secondary | ICD-10-CM | POA: Diagnosis not present

## 2022-04-07 DIAGNOSIS — I129 Hypertensive chronic kidney disease with stage 1 through stage 4 chronic kidney disease, or unspecified chronic kidney disease: Secondary | ICD-10-CM | POA: Insufficient documentation

## 2022-04-07 DIAGNOSIS — R001 Bradycardia, unspecified: Secondary | ICD-10-CM | POA: Diagnosis not present

## 2022-04-07 DIAGNOSIS — N1831 Chronic kidney disease, stage 3a: Secondary | ICD-10-CM | POA: Insufficient documentation

## 2022-04-07 DIAGNOSIS — R791 Abnormal coagulation profile: Secondary | ICD-10-CM | POA: Diagnosis not present

## 2022-04-07 DIAGNOSIS — T17908A Unspecified foreign body in respiratory tract, part unspecified causing other injury, initial encounter: Secondary | ICD-10-CM | POA: Diagnosis present

## 2022-04-07 LAB — CBC WITH DIFFERENTIAL/PLATELET
Abs Immature Granulocytes: 0.02 10*3/uL (ref 0.00–0.07)
Basophils Absolute: 0.1 10*3/uL (ref 0.0–0.1)
Basophils Relative: 1 %
Eosinophils Absolute: 0 10*3/uL (ref 0.0–0.5)
Eosinophils Relative: 0 %
HCT: 33.6 % — ABNORMAL LOW (ref 39.0–52.0)
Hemoglobin: 10.5 g/dL — ABNORMAL LOW (ref 13.0–17.0)
Immature Granulocytes: 0 %
Lymphocytes Relative: 6 %
Lymphs Abs: 0.6 10*3/uL — ABNORMAL LOW (ref 0.7–4.0)
MCH: 28.9 pg (ref 26.0–34.0)
MCHC: 31.3 g/dL (ref 30.0–36.0)
MCV: 92.6 fL (ref 80.0–100.0)
Monocytes Absolute: 0.4 10*3/uL (ref 0.1–1.0)
Monocytes Relative: 4 %
Neutro Abs: 9 10*3/uL — ABNORMAL HIGH (ref 1.7–7.7)
Neutrophils Relative %: 89 %
Platelets: 133 10*3/uL — ABNORMAL LOW (ref 150–400)
RBC: 3.63 MIL/uL — ABNORMAL LOW (ref 4.22–5.81)
RDW: 13.9 % (ref 11.5–15.5)
WBC: 10.1 10*3/uL (ref 4.0–10.5)
nRBC: 0 % (ref 0.0–0.2)

## 2022-04-07 LAB — COMPREHENSIVE METABOLIC PANEL
ALT: 12 U/L (ref 0–44)
AST: 16 U/L (ref 15–41)
Albumin: 3.1 g/dL — ABNORMAL LOW (ref 3.5–5.0)
Alkaline Phosphatase: 62 U/L (ref 38–126)
Anion gap: 9 (ref 5–15)
BUN: 28 mg/dL — ABNORMAL HIGH (ref 8–23)
CO2: 28 mmol/L (ref 22–32)
Calcium: 9.4 mg/dL (ref 8.9–10.3)
Chloride: 106 mmol/L (ref 98–111)
Creatinine, Ser: 1.32 mg/dL — ABNORMAL HIGH (ref 0.61–1.24)
GFR, Estimated: 56 mL/min — ABNORMAL LOW (ref >60)
Glucose, Bld: 78 mg/dL (ref 70–99)
Potassium: 4.4 mmol/L (ref 3.5–5.1)
Sodium: 143 mmol/L (ref 135–145)
Total Bilirubin: 0.5 mg/dL (ref 0.3–1.2)
Total Protein: 7.2 g/dL (ref 6.5–8.1)

## 2022-04-07 LAB — PROTIME-INR
INR: 1.1 (ref 0.8–1.2)
Prothrombin Time: 14.2 seconds (ref 11.4–15.2)

## 2022-04-07 LAB — BLOOD GAS, ARTERIAL
Acid-Base Excess: 3 mmol/L — ABNORMAL HIGH (ref 0.0–2.0)
Bicarbonate: 28.5 mmol/L — ABNORMAL HIGH (ref 20.0–28.0)
O2 Saturation: 97.4 %
Patient temperature: 37
pCO2 arterial: 47 mmHg (ref 32–48)
pH, Arterial: 7.39 (ref 7.35–7.45)
pO2, Arterial: 77 mmHg — ABNORMAL LOW (ref 83–108)

## 2022-04-07 LAB — RESP PANEL BY RT-PCR (FLU A&B, COVID) ARPGX2
Influenza A by PCR: NEGATIVE
Influenza B by PCR: NEGATIVE
SARS Coronavirus 2 by RT PCR: NEGATIVE

## 2022-04-07 LAB — LACTIC ACID, PLASMA
Lactic Acid, Venous: 1.3 mmol/L (ref 0.5–1.9)
Lactic Acid, Venous: 1.7 mmol/L (ref 0.5–1.9)

## 2022-04-07 LAB — APTT: aPTT: 45 seconds — ABNORMAL HIGH (ref 24–36)

## 2022-04-07 MED ORDER — LORAZEPAM 0.5 MG PO TABS
0.5000 mg | ORAL_TABLET | Freq: Every day | ORAL | Status: DC
  Administered 2022-04-08: 0.5 mg
  Filled 2022-04-07: qty 1

## 2022-04-07 MED ORDER — LACTATED RINGERS IV BOLUS (SEPSIS)
1000.0000 mL | Freq: Once | INTRAVENOUS | Status: AC
Start: 2022-04-07 — End: 2022-04-07
  Administered 2022-04-07: 1000 mL via INTRAVENOUS

## 2022-04-07 MED ORDER — ACETAMINOPHEN 325 MG PO TABS: 650.0000 mg | ORAL_TABLET | Freq: Four times a day (QID) | ORAL | Status: DC | PRN

## 2022-04-07 MED ORDER — POLYETHYLENE GLYCOL 3350 17 G PO PACK
17.0000 g | PACK | Freq: Every day | ORAL | Status: DC
  Administered 2022-04-08: 17 g
  Filled 2022-04-07: qty 1

## 2022-04-07 MED ORDER — VANCOMYCIN HCL 1500 MG/300ML IV SOLN
1500.0000 mg | Freq: Once | INTRAVENOUS | Status: AC
  Administered 2022-04-07: 1500 mg via INTRAVENOUS
  Filled 2022-04-07: qty 300

## 2022-04-07 MED ORDER — ACETAMINOPHEN 650 MG RE SUPP: 650.0000 mg | Freq: Four times a day (QID) | RECTAL | Status: DC | PRN

## 2022-04-07 MED ORDER — SODIUM CHLORIDE 0.9 % IV SOLN
1.5000 g | Freq: Four times a day (QID) | INTRAVENOUS | Status: DC
  Administered 2022-04-07 – 2022-04-08 (×3): 1.5 g via INTRAVENOUS
  Filled 2022-04-07 (×7): qty 4

## 2022-04-07 MED ORDER — LACTATED RINGERS IV BOLUS (SEPSIS)
1000.0000 mL | Freq: Once | INTRAVENOUS | Status: AC
  Administered 2022-04-07: 1000 mL via INTRAVENOUS

## 2022-04-07 MED ORDER — LACTATED RINGERS IV SOLN: INTRAVENOUS | Status: DC

## 2022-04-07 MED ORDER — FREE WATER
200.0000 mL | Status: DC
  Administered 2022-04-07 – 2022-04-08 (×3): 200 mL

## 2022-04-07 MED ORDER — ATROPINE SULFATE 1 % OP SOLN
4.0000 [drp] | OPHTHALMIC | Status: DC | PRN
  Filled 2022-04-07: qty 2

## 2022-04-07 MED ORDER — VANCOMYCIN HCL IN DEXTROSE 1-5 GM/200ML-% IV SOLN: 1000.0000 mg | Freq: Once | INTRAVENOUS | Status: DC

## 2022-04-07 MED ORDER — CEFEPIME HCL 2 G IV SOLR
2.0000 g | Freq: Once | INTRAVENOUS | Status: AC
  Administered 2022-04-07: 2 g via INTRAVENOUS
  Filled 2022-04-07: qty 12.5

## 2022-04-07 MED ORDER — ENOXAPARIN SODIUM 40 MG/0.4ML IJ SOSY
40.0000 mg | PREFILLED_SYRINGE | INTRAMUSCULAR | Status: DC
  Administered 2022-04-07: 40 mg via SUBCUTANEOUS
  Filled 2022-04-07: qty 0.4

## 2022-04-07 MED ORDER — POLYETHYLENE GLYCOL 3350 17 GM/SCOOP PO POWD
17.0000 g | Freq: Every day | ORAL | Status: DC
  Filled 2022-04-07: qty 255

## 2022-04-07 MED ORDER — SODIUM CHLORIDE 0.9% FLUSH
3.0000 mL | Freq: Two times a day (BID) | INTRAVENOUS | Status: DC
  Administered 2022-04-07: 3 mL via INTRAVENOUS

## 2022-04-07 NOTE — H&P (Signed)
History and Physical   Jack Rivas [id number] DOB: [birth year] DOA: 04/07/2022  PCP: System, Provider Not In   Patient coming from: Facility  Chief Complaint: Aspiration  HPI: Jack Rivas is a 76 y.o. male with medical history significant of dementia, junctional bradycardia, dysphagia with PEG in place, CKD 3A, anemia, malnutrition presenting after aspiration episode at facility.  History obtained with assistance of chart review due to patient's severe dementia.  Patient reportedly had a aspiration event at facility believed to be his own secretions.  He had 50 mL of clear liquid suctioned.  Throughout the day he became increasing short of breath and was noted to have desaturations into the 70s later in the day.  At this time EMS was called and patient was transported to the ED for further evaluation.  There is also some report that he is acting off his baseline, being less interactive, but this has improved somewhat in the ED.  Unable to obtain full review of systems due to patient's severe dementia.  ED Course: Vital signs in the ED significant for bradycardia in the 40s, blood pressure in the [id number] to [id number] systolic.  Lab work-up included CMP with BUN 28 and creatinine near baseline at 1.32, albumin 3.1.  CBC with hemoglobin stable at 10.5 and platelets stable 133.  PT and INR normal.  PTT stably elevated at 45.  Lactic acid normal, repeat pending.  Rester panel for flu COVID pending.  Urinalysis, urine culture, blood culture pending.  ABG showed normal pH, normal PCO2, PO2 of 77.  Chest x-ray showed large new infiltrate at the right mid and lower lung field suggestive of aspiration pneumonitis versus pneumonia as well as a small right pleural effusion.  Patient received vancomycin and cefepime in the ED as well as 3 L of fluid and started on a rate of fluids.  Review of Systems:Unable to obtain full review of systems due to patient's severe dementia.  Past Medical History:  Diagnosis Date    Aspiration pneumonia ([place]) 03/20/2021   Dementia ([place])    HCAP (healthcare-associated pneumonia) 04/20/2021   Hypertension    Hypoglycemia 10/25/2021   Severe sepsis ([place]) 04/20/2021   Uses feeding tube     Past Surgical History:  Procedure Laterality Date   CATARACT EXTRACTION, BILATERAL     KNEE SURGERY      Social History  reports that he has been smoking. He has never used smokeless tobacco. He reports that he does not currently use alcohol. He reports that he does not use drugs.  No Known Allergies  No family history on file.  Prior to Admission medications   Medication Sig Start Date End Date Taking? Authorizing Provider  acetaminophen (TYLENOL) 160 MG/5ML suspension Place 10.2 mLs (325 mg total) into feeding tube every 6 (six) hours as needed for mild pain, headache or fever. 03/06/22   Ghimire, Henreitta Leber, MD  atropine 1 % ophthalmic solution Place 4 drops under the tongue every 4 (four) hours as needed (secretions).    [provider]  diclofenac Sodium (VOLTAREN) 1 % GEL Apply 2 g topically 4 (four) times daily. 03/06/22   Ghimire, Henreitta Leber, MD  LORazepam (ATIVAN) 0.5 MG tablet Place 1 tablet (0.5 mg total) into feeding tube daily. 03/16/22   Darliss Cheney, MD  Nutritional Supplements (FEEDING SUPPLEMENT, JEVITY 1.5 CAL/FIBER,) LIQD Place 1,000 mLs into feeding tube continuous. At 55 ml/hr 03/06/22   Ghimire, Henreitta Leber, MD  polyethylene glycol powder (GLYCOLAX/MIRALAX) 17 GM/SCOOP powder Place 17 g  into feeding tube daily. 03/06/22   Ghimire, Henreitta Leber, MD  Protein (FEEDING SUPPLEMENT, PROSOURCE SG:[id number],) liquid Place 60 mLs into feeding tube daily. 03/06/22 06/04/22  GhimireHenreitta Leber, MD  valproic acid (DEPAKENE) 250 MG/5ML solution Take 250 mg by mouth at bedtime.    [provider]  Water For Irrigation, Sterile (FREE WATER) SOLN Place 200 mLs into feeding tube every 4 (four) hours. 03/06/22 06/04/22  Jonetta Osgood, MD    Physical Exam: Vitals:   04/07/22  1635 04/07/22 1637 04/07/22 1700 04/07/22 1800  BP: 109/68   122/65  Pulse: (!) 48   (!) 47  Resp: 11   (!) 8  Temp:   97.8 F (36.6 C)   TempSrc:   Axillary   SpO2: 96%   97%  Weight:  87.5 kg    Height:  6\' 4"  (1.93 m)      Physical Exam Constitutional:      General: He is not in acute distress.    Appearance: He is ill-appearing.     Comments: Lethargic and difficult to arouse.  HENT:     Head: Normocephalic and atraumatic.     Mouth/Throat:     Mouth: Mucous membranes are moist.     Pharynx: Oropharynx is clear.  Eyes:     Extraocular Movements: Extraocular movements intact.     Pupils: Pupils are equal, round, and reactive to light.  Cardiovascular:     Rate and Rhythm: Regular rhythm. Bradycardia present.     Pulses: Normal pulses.     Heart sounds: Normal heart sounds.  Pulmonary:     Effort: Pulmonary effort is normal. No respiratory distress.     Breath sounds: Normal breath sounds.  Abdominal:     General: Bowel sounds are normal. There is no distension.     Palpations: Abdomen is soft.     Tenderness: There is no abdominal tenderness.  Musculoskeletal:        General: No swelling or deformity.  Skin:    General: Skin is warm and dry.  Neurological:     General: No focal deficit present.     Mental Status: Mental status is at baseline.    Labs on Admission: I have personally reviewed following labs and imaging studies  CBC: Recent Labs  Lab 04/07/22 1618  WBC 10.1  NEUTROABS 9.0*  HGB 10.5*  HCT 33.6*  MCV 92.6  PLT 133*    Basic Metabolic Panel: Recent Labs  Lab 04/07/22 1618  NA 143  K 4.4  CL 106  CO2 28  GLUCOSE 78  BUN 28*  CREATININE 1.32*  CALCIUM 9.4    GFR: Estimated Creatinine Clearance: 58.5 mL/min (A) (by C-G formula based on SCr of 1.32 mg/dL (H)).  Liver Function Tests: Recent Labs  Lab 04/07/22 1618  AST 16  ALT 12  ALKPHOS 62  BILITOT 0.5  PROT 7.2  ALBUMIN 3.1*    Urine analysis:    Component Value  Date/Time   COLORURINE YELLOW 03/01/2022 1336   APPEARANCEUR CLEAR 03/01/2022 1336   LABSPEC 1.016 03/01/2022 1336   PHURINE 6.0 03/01/2022 1336   GLUCOSEU NEGATIVE 03/01/2022 1336   HGBUR NEGATIVE 03/01/2022 1336   [place] 03/01/2022 1336   KETONESUR NEGATIVE 03/01/2022 1336   PROTEINUR NEGATIVE 03/01/2022 1336   NITRITE NEGATIVE 03/01/2022 1336   LEUKOCYTESUR NEGATIVE 03/01/2022 1336    Radiological Exams on Admission: DG Chest Port 1 View  Result Date: 04/07/2022 CLINICAL DATA:  Possible sepsis, pneumonia  EXAM: PORTABLE CHEST 1 VIEW COMPARISON:  03/12/2022 FINDINGS: Transverse diameter of heart is in the upper limits of normal. Thoracic aorta is tortuous. There is new large infiltrate in right mid and right lower lung fields. Small linear densities are seen in left parahilar region. There is blunting of right lateral CP angle. There is no pneumothorax. 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 IMPRESSION: There is large new infiltrate in right mid and right lower lung fields suggesting pneumonia/aspiration. Small right pleural effusion. Small linear densities in left parahilar region may suggest subsegmental atelectasis. Electronically Signed   By: Elmer Picker M.D.   On: 04/07/2022 16:53    EKG: Independently reviewed.  Not yet performed, telemetry shows chronic junctional bradycardia.  Assessment/Plan Active Problems:   Dementia (HCC)   Junctional bradycardia   Dysphagia   Chronic kidney disease, stage 3a (HCC)   Anemia of chronic disease   Aspiration pneumonia (HCC)   Aspiration PEG tube in place Aspiration pneumonia > Patient with known history of aspiration and prior history of aspiration pneumonia presenting with aspiration event. > Patient had aspiration present at facility with clear liquid suction and developed subsequent shortness of breath and found to be hypoxic and EMS was called to transfer patient to the ED. > Also reportedly had some  acute encephalopathy on his chronic dementia but this is difficult to interpret due to his chronic dementia.  Seems to be improving somewhat in the ED per reports. > Received vancomycin and cefepime in the ED as well as 3 L of fluid. - Monitor on telemetry - Switch antibiotics to Unasyn he has had good results with this during previous admissions - Continue with IV fluids - Trend fever curve and WBC - Supplemental oxygen as needed - n.p.o., medications per tube, consult to dietitian for tube feeds  Junctional bradycardia > Noted to continue have heart rate in the 40s.  He has known history of this and was determined to be a noncandidate for pacemaker.  Has been DNR in the past but currently is partial code as below. - Continue to monitor  CKD 3a > Creatinine remains near baseline at 1.32. > Received 3 L IV fluids in the ED - Continue with IV fluids overnight - Trend renal function and electrolytes  Anemia > Hemoglobin stable at 10.5 - Trend CBC  DVT prophylaxis: Lovenox Code Status:   Partial code.  Okay with ACLS intervention but no intubation this was confirmed with family by EDP Family Communication:  Attempted to reach daughter by phone, but there is no answer.  EDP was able to speak with daughter earlier in the evening.  Disposition Plan:   Patient is from:  Skilled nursing facility  Anticipated DC to:  Same as above  Anticipated DC date:  1 to 4 days  Anticipated DC barriers: None  Consults called:  None Admission status:  Observation, telemetry  Severity of Illness: The appropriate patient status for this patient is OBSERVATION. Observation status is judged to be reasonable and necessary in order to provide the required intensity of service to ensure the patient's safety. The patient's presenting symptoms, physical exam findings, and initial radiographic and laboratory data in the context of their medical condition is felt to place them at decreased risk for further  clinical deterioration. Furthermore, it is anticipated that the patient will be medically stable for discharge from the hospital within 2 midnights of admission.    Marcelyn Bruins MD [facility]  How to contact the [facility]  Attending or Consulting provider [place] or covering provider during after hours [place], for this patient?   Check the care team in [facility] and look for a) attending/consulting [facility] provider listed and b) the [facility] team listed Log into [email or web address] and use [place] universal password to access. If you do not have the password, please contact the hospital operator. Locate the [facility] provider you are looking for under [facility] and page to a number that you can be directly reached. If you still have difficulty reaching the provider, please page the [facility] (Director on Call) for the Hospitalists listed on amion for assistance.  04/07/2022, 7:00 PM

## 2022-04-07 NOTE — ED Provider Notes (Signed)
[facility]-EMERGENCY DEPT Provider Note   CSN: [id number] Arrival date & time: 04/07/22  1608     History  Chief Complaint  Patient presents with   Aspiration    Jack Rivas is a 76 y.o. male.  76 yo M with a chief complaint of cough and shortness of breath.  This was noticed this morning.  Thought to have aspirated on his own secretions.  Has had difficulty breathing throughout the day and eventually decided to send him to the department for evaluation.  Patient is severely demented and unable to provide any history level 5 caveat.        Home Medications Prior to Admission medications   Medication Sig Start Date End Date Taking? Authorizing Provider  acetaminophen (TYLENOL) 160 MG/5ML suspension Place 10.2 mLs (325 mg total) into feeding tube every 6 (six) hours as needed for mild pain, headache or fever. 03/06/22   Ghimire, Werner LeanShanker M, MD  atropine 1 % ophthalmic solution Place 4 drops under the tongue every 4 (four) hours as needed (secretions).    [provider]  diclofenac Sodium (VOLTAREN) 1 % GEL Apply 2 g topically 4 (four) times daily. 03/06/22   Ghimire, Werner LeanShanker M, MD  LORazepam (ATIVAN) 0.5 MG tablet Place 1 tablet (0.5 mg total) into feeding tube daily. 03/16/22   [name], [name], MD  Nutritional Supplements (FEEDING SUPPLEMENT, JEVITY 1.5 CAL/FIBER,) LIQD Place 1,000 mLs into feeding tube continuous. At 55 ml/hr 03/06/22   Ghimire, Werner LeanShanker M, MD  polyethylene glycol powder (GLYCOLAX/MIRALAX) 17 GM/SCOOP powder Place 17 g into feeding tube daily. 03/06/22   Ghimire, Werner LeanShanker M, MD  Protein (FEEDING SUPPLEMENT, PROSOURCE [id number],) liquid Place 60 mLs into feeding tube daily. 03/06/22 06/04/22  GhimireWerner Lean, Shanker M, MD  valproic acid (DEPAKENE) 250 MG/5ML solution Take 250 mg by mouth at bedtime.    [provider]  Water For Irrigation, Sterile (FREE WATER) SOLN Place 200 mLs into feeding tube every 4 (four) hours. 03/06/22 06/04/22  Ghimire,  Werner LeanShanker M, MD      Allergies    Patient has no known allergies.    Review of Systems   Review of Systems  Physical Exam Updated Vital Signs BP 115/68   Pulse (!) 41   Temp (!) 97.3 F (36.3 C) (Axillary)   Resp 19   Ht 6\' 4"  (1.93 m)   Wt 87.5 kg   SpO2 95%   BMI 23.48 kg/m  Physical Exam Vitals and nursing note reviewed.  Constitutional:      Appearance: He is well-developed.  HENT:     Head: Normocephalic and atraumatic.  Eyes:     Pupils: Pupils are equal, round, and reactive to light.  Neck:     Vascular: No JVD.  Cardiovascular:     Rate and Rhythm: Normal rate and regular rhythm.     Heart sounds: No murmur heard.    No friction rub. No gallop.  Pulmonary:     Effort: No respiratory distress.     Breath sounds: No wheezing.  Abdominal:     General: There is no distension.     Tenderness: There is no abdominal tenderness. There is no guarding or rebound.  Musculoskeletal:        General: Normal range of motion.     Cervical back: Normal range of motion and neck supple.  Skin:    Coloration: Skin is not pale.     Findings: No rash.  Neurological:     Mental Status:  He is alert.  Psychiatric:        Behavior: Behavior normal.     ED Results / Procedures / Treatments   Labs (all labs ordered are listed, but only abnormal results are displayed) Labs Reviewed  COMPREHENSIVE METABOLIC PANEL - Abnormal; Notable for the following components:      Result Value   BUN 28 (*)    Creatinine, Ser 1.32 (*)    Albumin 3.1 (*)    GFR, Estimated 56 (*)    All other components within normal limits  CBC WITH DIFFERENTIAL/PLATELET - Abnormal; Notable for the following components:   RBC 3.63 (*)    Hemoglobin 10.5 (*)    HCT 33.6 (*)    Platelets 133 (*)    Neutro Abs 9.0 (*)    Lymphs Abs 0.6 (*)    All other components within normal limits  APTT - Abnormal; Notable for the following components:   aPTT 45 (*)    All other components within normal limits   BLOOD GAS, ARTERIAL - Abnormal; Notable for the following components:   pO2, Arterial 77 (*)    Bicarbonate 28.5 (*)    Acid-Base Excess 3.0 (*)    All other components within normal limits  RESP PANEL BY RT-PCR (FLU A&B, COVID) ARPGX2  CULTURE, BLOOD (ROUTINE X 2)  CULTURE, BLOOD (ROUTINE X 2)  URINE CULTURE  LACTIC ACID, PLASMA  PROTIME-INR  LACTIC ACID, PLASMA  URINALYSIS, ROUTINE W REFLEX MICROSCOPIC  COMPREHENSIVE METABOLIC PANEL  CBC    EKG None  Radiology DG Chest Port 1 View  Result Date: 04/07/2022 CLINICAL DATA:  Possible sepsis, pneumonia EXAM: PORTABLE CHEST 1 VIEW COMPARISON:  03/12/2022 FINDINGS: Transverse diameter of heart is in the upper limits of normal. Thoracic aorta is tortuous. There is new large infiltrate in right mid and right lower lung fields. Small linear densities are seen in left parahilar region. There is blunting of right lateral CP angle. There is no pneumothorax. 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 IMPRESSION: There is large new infiltrate in right mid and right lower lung fields suggesting pneumonia/aspiration. Small right pleural effusion. Small linear densities in left parahilar region may suggest subsegmental atelectasis. Electronically Signed   By: Elmer Picker M.D.   On: 04/07/2022 16:53    Procedures Procedures    Medications Ordered in ED Medications  lactated ringers infusion ( Intravenous New Bag/Given 04/07/22 1747)  LORazepam (ATIVAN) tablet 0.5 mg (has no administration in time range)  free water 200 mL (has no administration in time range)  atropine 1 % ophthalmic solution 4 drop (has no administration in time range)  enoxaparin (LOVENOX) injection 40 mg (has no administration in time range)  ampicillin-sulbactam (UNASYN) 1.5 g in sodium chloride 0.9 % 100 mL IVPB (has no administration in time range)  sodium chloride flush (NS) 0.9 % injection 3 mL (has no administration in time range)  acetaminophen (TYLENOL)  tablet 650 mg (has no administration in time range)    Or  acetaminophen (TYLENOL) suppository 650 mg (has no administration in time range)  polyethylene glycol (MIRALAX / GLYCOLAX) packet 17 g (has no administration in time range)  lactated ringers bolus 1,000 mL (1,000 mLs Intravenous New Bag/Given 04/07/22 1646)    And  lactated ringers bolus 1,000 mL (1,000 mLs Intravenous New Bag/Given 04/07/22 1749)    And  lactated ringers bolus 1,000 mL (1,000 mLs Intravenous New Bag/Given 04/07/22 1900)  ceFEPIme (MAXIPIME) 2 g in sodium chloride 0.9 % 100  mL IVPB (2 g Intravenous New Bag/Given 04/07/22 1642)  vancomycin (VANCOREADY) IVPB 1500 mg/300 mL (1,500 mg Intravenous New Bag/Given 04/07/22 1651)    ED Course/ Medical Decision Making/ A&P                           Medical Decision Making Amount and/or Complexity of Data Reviewed Labs: ordered. Radiology: ordered. ECG/medicine tests: ordered.  Risk Prescription drug management. Decision regarding hospitalization.   76 yo M with a chief complaints of concern for aspiration.  The patient actually had a similar presentation at his most recent hospitalization.  At that time he was discharged to hospice.  He is currently under hospice care.  Patient is demented unable to provide any history.  He was a DNR at discharge last admission.  Chest x-ray is concerning for an aspiration pneumonia right middle and lower lobe infiltrates on my view and independent interpretation.  No leukocytosis no significant anemia no significant electrolyte abnormality.  I was able to get in touch with the daughter.  She did state that he was a DNR when he left the hospital and she felt like he should be resuscitated should something happen.  On further discussion she did not want him to be intubated.  She was okay with ACLS medications and chest compressions and defibrillation.  We will discuss with medicine for admission.  CRITICAL CARE Performed by: Rae Roam   Total critical care time: 35 minutes  Critical care time was exclusive of separately billable procedures and treating other patients.  Critical care was necessary to treat or prevent imminent or life-threatening deterioration.  Critical care was time spent personally by me on the following activities: development of treatment plan with patient and/or surrogate as well as nursing, discussions with consultants, evaluation of patient's response to treatment, examination of patient, obtaining history from patient or surrogate, ordering and performing treatments and interventions, ordering and review of laboratory studies, ordering and review of radiographic studies, pulse oximetry and re-evaluation of patient's condition.  The patients results and plan were reviewed and discussed.   Any x-rays performed were independently reviewed by myself.   Differential diagnosis were considered with the presenting HPI.  Medications  lactated ringers infusion ( Intravenous New Bag/Given 04/07/22 1747)  LORazepam (ATIVAN) tablet 0.5 mg (has no administration in time range)  free water 200 mL (has no administration in time range)  atropine 1 % ophthalmic solution 4 drop (has no administration in time range)  enoxaparin (LOVENOX) injection 40 mg (has no administration in time range)  ampicillin-sulbactam (UNASYN) 1.5 g in sodium chloride 0.9 % 100 mL IVPB (has no administration in time range)  sodium chloride flush (NS) 0.9 % injection 3 mL (has no administration in time range)  acetaminophen (TYLENOL) tablet 650 mg (has no administration in time range)    Or  acetaminophen (TYLENOL) suppository 650 mg (has no administration in time range)  polyethylene glycol (MIRALAX / GLYCOLAX) packet 17 g (has no administration in time range)  lactated ringers bolus 1,000 mL (1,000 mLs Intravenous New Bag/Given 04/07/22 1646)    And  lactated ringers bolus 1,000 mL (1,000 mLs Intravenous New Bag/Given  04/07/22 1749)    And  lactated ringers bolus 1,000 mL (1,000 mLs Intravenous New Bag/Given 04/07/22 1900)  ceFEPIme (MAXIPIME) 2 g in sodium chloride 0.9 % 100 mL IVPB (2 g Intravenous New Bag/Given 04/07/22 1642)  vancomycin (VANCOREADY) IVPB 1500 mg/300 mL (1,500 mg Intravenous  New Bag/Given 04/07/22 1651)    Vitals:   04/07/22 1745 04/07/22 1800 04/07/22 1915 04/07/22 1930  BP: 120/74 122/65 125/63 115/68  Pulse: (!) 57 (!) 47 (!) 43 (!) 41  Resp: 17 (!) 8 19 19   Temp:    (!) 97.3 F (36.3 C)  TempSrc:    Axillary  SpO2: 98% 97% 95% 95%  Weight:      Height:        Final diagnoses:  Aspiration pneumonia of right middle lobe due to gastric secretions [facility])    Admission/ observation were discussed with the admitting physician, patient and/or family and they are comfortable with the plan.          Final Clinical Impression(s) / ED Diagnoses Final diagnoses:  Aspiration pneumonia of right middle lobe due to gastric secretions [facility])    Rx / DC Orders ED Discharge Orders     None         [facility], DO 04/07/22 1950

## 2022-04-07 NOTE — Progress Notes (Signed)
Pharmacy Note   A consult was received from an ED physician for vancomycin and cefepime per pharmacy dosing.    The patient's profile has been reviewed for ht/wt/allergies/indication/available labs.    Most recent weight available is 87.5 kg from 03/15/2022   A one time order has been placed for vancomycin 1500 mg IV x1 and cefepime 2 gr IV x1 .    Further antibiotics/pharmacy consults should be ordered by admitting physician if indicated.                       Thank you,  Royetta Asal, PharmD, BCPS 04/07/2022 4:23 PM

## 2022-04-07 NOTE — ED Triage Notes (Signed)
Pt bib ems form facility. Per EMS pt possibly aspirated this morning. Facility reported 50 ml clear liquid was suctioned from pt. O2 in the 70's this morning at facility, normally on room air

## 2022-04-08 ENCOUNTER — Other Ambulatory Visit: Payer: Self-pay

## 2022-04-08 DIAGNOSIS — D631 Anemia in chronic kidney disease: Secondary | ICD-10-CM | POA: Diagnosis not present

## 2022-04-08 DIAGNOSIS — N1831 Chronic kidney disease, stage 3a: Secondary | ICD-10-CM | POA: Diagnosis not present

## 2022-04-08 DIAGNOSIS — J690 Pneumonitis due to inhalation of food and vomit: Secondary | ICD-10-CM

## 2022-04-08 DIAGNOSIS — Z20822 Contact with and (suspected) exposure to covid-19: Secondary | ICD-10-CM | POA: Diagnosis not present

## 2022-04-08 LAB — BLOOD CULTURE ID PANEL (REFLEXED) - BCID2

## 2022-04-08 LAB — CBC
HCT: 33.1 % — ABNORMAL LOW (ref 39.0–52.0)
Hemoglobin: 10.1 g/dL — ABNORMAL LOW (ref 13.0–17.0)
MCH: 28.8 pg (ref 26.0–34.0)
MCHC: 30.5 g/dL (ref 30.0–36.0)
MCV: 94.3 fL (ref 80.0–100.0)
Platelets: 106 10*3/uL — ABNORMAL LOW (ref 150–400)
RBC: 3.51 MIL/uL — ABNORMAL LOW (ref 4.22–5.81)
RDW: 14.2 % (ref 11.5–15.5)
WBC: 9.7 10*3/uL (ref 4.0–10.5)
nRBC: 0 % (ref 0.0–0.2)

## 2022-04-08 LAB — COMPREHENSIVE METABOLIC PANEL
ALT: 11 U/L (ref 0–44)
AST: 15 U/L (ref 15–41)
Albumin: 2.7 g/dL — ABNORMAL LOW (ref 3.5–5.0)
Alkaline Phosphatase: 55 U/L (ref 38–126)
Anion gap: 7 (ref 5–15)
BUN: 25 mg/dL — ABNORMAL HIGH (ref 8–23)
CO2: 26 mmol/L (ref 22–32)
Calcium: 9.2 mg/dL (ref 8.9–10.3)
Chloride: 108 mmol/L (ref 98–111)
Creatinine, Ser: 1.2 mg/dL (ref 0.61–1.24)
GFR, Estimated: >60 mL/min (ref >60)
Glucose, Bld: 75 mg/dL (ref 70–99)
Potassium: 4.4 mmol/L (ref 3.5–5.1)
Sodium: 141 mmol/L (ref 135–145)
Total Bilirubin: 0.6 mg/dL (ref 0.3–1.2)
Total Protein: 6.7 g/dL (ref 6.5–8.1)

## 2022-04-08 LAB — PHOSPHORUS: Phosphorus: 4 mg/dL (ref 2.5–4.6)

## 2022-04-08 LAB — MAGNESIUM: Magnesium: 1.7 mg/dL (ref 1.7–2.4)

## 2022-04-08 MED ORDER — AMOXICILLIN-POT CLAVULANATE 875-125 MG PO TABS: 1.0000 | ORAL_TABLET | Freq: Two times a day (BID) | ORAL | 0 refills | Status: DC

## 2022-04-08 MED ORDER — SODIUM CHLORIDE 0.9 % IV SOLN: INTRAVENOUS | Status: DC

## 2022-04-08 NOTE — Progress Notes (Signed)
[place] facility several times at [phone] to give report, no answer. Patient transported back to facility by EMS. All paperwork provided to EMS.

## 2022-04-08 NOTE — Progress Notes (Signed)
SLP Cancellation Note  Patient Details Name: Jack Rivas MRN: [id number] DOB: [birth year]   Cancelled treatment:       Reason Eval/Treat Not Completed: Other (comment) (SLP confirmed with MD that swallow evaluation no longer warranted as patient returning to Hospice care at his SNF.) He has known dysphagia at baseline and is on a Dys 1 (puree), thin liquids diet.  Sonia Baller, MA, CCC-SLP Speech Therapy

## 2022-04-08 NOTE — Progress Notes (Signed)
Received call from [facility] in regard to patient heart rate being 38 and trending down. Advised at I was at bedside at the time. Patient is sleeping with mouth open, in no distress.

## 2022-04-08 NOTE — Discharge Summary (Addendum)
Physician Discharge Summary   Patient: Jack Rivas MRN: [id number] DOB: 1945/10/18  Admit date:     04/07/2022  Discharge date: 04/08/22  Discharge Physician: Edwin Dada   PCP: Pcp, No     Recommendations at discharge:  Follow up with Hospice     Discharge Diagnoses: Principal discharge Problem:   Aspiration pneumonitis  Active Problems:   Dementia   Junctional bradycardia   Dysphagia   Chronic kidney disease, stage 3a   Anemia of chronic disease   Protein calorie malnutrition, moderate    Hospital Course: Jack Rivas is a 76 y.o. M with advanced dementia and behavioral disturbances, complicated by dysphagia and recurrent aspiration, s/p PEG tube, no longer being used, Failure to thrive, moderate malnutrition, and bradycardia who presented after witnessed choking event.  - Patient had PEG placed 2 year ago in [place], in context of prolonged mechanical ventilation requiring trach and PEG - Was used briefly, but for the last 2 years, has not been used, and patient has been on modified diets, under care of speech therapy at times (it appears)  - In the last few months, the patient has had accelerating aspiration pneumonias, worsening functional status and enrolled in Hospice  On days prior to admission, patient noted to be weaker, was started on antibiotics for suspected aspiration pneumonia by Hospice team.    On day of admission, was noted to have witnessed choking.  EMS called, they suctioned 95mL clear liquid and he was hypoxic to the 70s.  In the ER, CXR showed new opacity.  He was stable on room air.  Bradycardic.  He was admitted for overnight observation.      Aspiration pneumonitis  Possible aspiration pneumonia Dementia and recurrent aspiration in setting of terminal dysphagia Protein calorie malnutrition, moderate The patient malnutrition and dysphagia and recurrent aspiration are expected, incorrectable and terminal normal aspects of his  dementia.  I discussed with Hospice, who are providing excellent support to patient and family at his nursing home.  Given the new infiltrate, I feel that antibiotics are reasonable, but the patient is afebrile, HR <90, RR < 20, SpO2 at baseline and able to take orals.  Stable to treat at his nursing facility.     ADDENDUM: Talked to Hospice liaison and DON of [place].  Patient has been declining since arrival to [facility] several weeks ago.  Only able to sit up in Geri-chair, functional status and mentation seem to be declining.  Was on antibiotics prior to this admission, family called EMS for this admission, and on arrival to ER, vitals were normal.  I spoke to daughter Jack Rivas and reviewed trajectory, expected natural history of dementia with aspiration, his clear pattern of worsening despite antibiotics. I was able to share my medical opinion that he was at the end of life, and the fact that his continued downward trajectory were natural and irreversible before daughter unilaterally ended phone call.   S/P percutaneous gastrostomy tube It had been relayed to me that she wished to have his PEG tube removed, and I reviewed this with both gastroenterology and General Surgery informally, both of whom stated this might be pursued on an outpatient basis theoretically, but in a patient with a prognosis of weeks such as this, they would not offer such a procedure.   Junctional bradycardia This is previously known.  Was evaluated by Cardiology recently, not a candidate for pacing.  This will hasten his decline, physcially and mentally.  Disposition: Skilled nursing facility Diet recommendation: Dysphagia 1, pureed with thin liquids   DISCHARGE MEDICATION: Allergies as of 04/08/2022   No Known Allergies      Medication List     TAKE these medications    amoxicillin-clavulanate 875-125 MG tablet Commonly known as: AUGMENTIN Take 1 tablet by mouth 2 (two) times  daily.   atropine 1 % ophthalmic solution Place 4 drops under the tongue every 4 (four) hours as needed (secretions).   diclofenac Sodium 1 % Gel Commonly known as: Voltaren Apply 2 g topically 4 (four) times daily.   feeding supplement (JEVITY 1.5 CAL/FIBER) Liqd Place 1,000 mLs into feeding tube continuous. At 55 ml/hr   feeding supplement (PROSource TF20) liquid Place 60 mLs into feeding tube daily.   free water Soln Place 200 mLs into feeding tube every 4 (four) hours.   LORazepam 0.5 MG tablet Commonly known as: ATIVAN Place 1 tablet (0.5 mg total) into feeding tube daily.   polyethylene glycol powder 17 GM/SCOOP powder Commonly known as: GLYCOLAX/MIRALAX Place 17 g into feeding tube daily.   SM Pain & Fever Childrens 160 MG/5ML suspension Generic drug: acetaminophen Place 10.2 mLs (325 mg total) into feeding tube every 6 (six) hours as needed for mild pain, headache or fever.   valproic acid 250 MG/5ML solution Commonly known as: DEPAKENE Take 250 mg by mouth at bedtime.           Discharge Exam: Filed Weights   04/07/22 1637  Weight: 87.5 kg    General: Pt is sleeping, but rouses to touch, makes eye contact, disoriented but at baseline Cardiovascular: RRR, nl S1-S2, no murmurs appreciated.   No LE edema.   Respiratory: Normal respiratory rate and rhythm.  Diminished throughout, no rales or wheezes that I appreciate Abdominal: Abdomen soft and non-tender.  No distension or HSM.   Neuro/Psych: Strength symmetric in upper and lower extremities but very weak generally, confused at baseline.   Condition at discharge: worsening  The results of significant diagnostics from this hospitalization (including imaging, microbiology, ancillary and laboratory) are listed below for reference.   Imaging Studies: DG Chest Port 1 View  Result Date: 04/07/2022 CLINICAL DATA:  Possible sepsis, pneumonia EXAM: PORTABLE CHEST 1 VIEW COMPARISON:  03/12/2022 FINDINGS:  Transverse diameter of heart is in the upper limits of normal. Thoracic aorta is tortuous. There is new large infiltrate in right mid and right lower lung fields. Small linear densities are seen in left parahilar region. There is blunting of right lateral CP angle. There is no pneumothorax. 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 IMPRESSION: There is large new infiltrate in right mid and right lower lung fields suggesting pneumonia/aspiration. Small right pleural effusion. Small linear densities in left parahilar region may suggest subsegmental atelectasis. Electronically Signed   By: Elmer Picker M.D.   On: 04/07/2022 16:53   CT ABDOMEN PELVIS WO CONTRAST  Result Date: 03/13/2022 CLINICAL DATA:  Question of G-tube dysfunction. EXAM: CT ABDOMEN AND PELVIS WITHOUT CONTRAST TECHNIQUE: Multidetector CT imaging of the abdomen and pelvis was performed following the standard protocol without IV contrast. RADIATION DOSE REDUCTION: This exam was performed according to the departmental dose-optimization program which includes automated exposure control, adjustment of the mA and/or kV according to patient size and/or use of iterative reconstruction technique. COMPARISON:  Abdominal radiographs of the same date. CT of the chest of October 24, 2021 FINDINGS: Lower chest: Patchy airspace opacities at the lung bases. These are improved compared to more remote  imaging but again compatible with post aspiration change a component of which is likely chronic. No dense consolidative process. Hepatobiliary: Liver with smooth contours. No pericholecystic stranding. Pancreas: Pancreatic atrophy without signs of inflammation. Spleen: Normal. Adrenals/Urinary Tract: Adrenal glands are normal. Smooth renal contours without hydronephrosis. No ureteral dilation or nephrolithiasis. Stomach/Bowel: G-tube in the stomach. No stranding along the tract. No pneumoperitoneum. No contrast media is present in the stomach. No contrast  media is present in the small bowel aside from mildly dilute contrast media perhaps administered four attempt at G-tube evaluation earlier. There is dense contrast media in the colon from the patient's previous swallow evaluation. This is mainly in the distal colon. No acute colonic or small bowel process.  The appendix is normal. Vascular/Lymphatic: Aortic atherosclerosis.  No aneurysmal dilation. No adenopathy in the pelvis or in the abdomen. Reproductive: Unremarkable by CT. Other: No pneumoperitoneum.  No ascites. Musculoskeletal: Heterotopic ossification about the bilateral hips. Spinal degenerative changes. Interval compression fracture at L1 with mild 10% to 20% loss of height without surrounding stranding has occurred since September of 2022. IMPRESSION: 1. G-tube in the stomach. No pneumoperitoneum or stranding around the G2. No contrast media in the stomach. 2. Patchy airspace opacities at the lung bases are improved compared to more remote imaging but again compatible with post aspiration change a component of which is likely chronic. Correlate with any ongoing respiratory symptoms. 3. L1 compression fracture is new since September of 2022. Correlate with any pain in this area. 4. Aortic atherosclerosis. Aortic Atherosclerosis (ICD10-I70.0). Electronically Signed   By: Zetta Bills M.D.   On: 03/13/2022 20:40   DG Abd 1 View  Result Date: 03/13/2022 CLINICAL DATA:  280330; peg tube problem, check placement. EXAM: ABDOMEN-1 precontrast and 2 post-contrast supine views of the abdomen were obtained. Contrast: 50 mL of Omnipaque COMPARISON:  January 25, 2022 FINDINGS: The precontrast supine view of the abdomen demonstrates oral contrast opacifying the descending colon, sigmoid and rectosigmoid. Extensive diverticulosis of the sigmoid colon. The tip of the G-tube appears to project at the region of the body of the stomach. Contrast is not well delineated in the gastric lumen. Bowel-gas pattern is  nonobstructive. IMPRESSION: 1. Precontrast imaging of the abdomen demonstrates oral contrast from the previous study in the descending colon, sigmoid and rectosigmoid. Extensive diverticulosis of the sigmoid colon. 2. Omnipaque contrast is not well delineated in the gastric lumen. Suggest injecting the contrast through the G-tube and evaluating the G-tube under continuous fluoroscopic examination. Electronically Signed   By: Frazier Richards M.D.   On: 03/13/2022 18:23   CT Head Wo Contrast  Result Date: 03/13/2022 CLINICAL DATA:  Status post fall. EXAM: CT HEAD WITHOUT CONTRAST TECHNIQUE: Contiguous axial images were obtained from the base of the skull through the vertex without intravenous contrast. RADIATION DOSE REDUCTION: This exam was performed according to the departmental dose-optimization program which includes automated exposure control, adjustment of the mA and/or kV according to patient size and/or use of iterative reconstruction technique. COMPARISON:  March 01, 2022 FINDINGS: Brain: There is mild cerebral atrophy with widening of the extra-axial spaces and ventricular dilatation. There are areas of decreased attenuation within the white matter tracts of the supratentorial brain, consistent with microvascular disease changes. Vascular: There is marked severity calcification of the bilateral cavernous carotid arteries. Skull: A chronic nondisplaced left-sided nasal bone fracture is seen. Sinuses/Orbits: Mild left maxillary sinus and mild bilateral ethmoid sinus mucosal thickening is seen. Marked severity right maxillary sinus and sphenoid sinus  mucosal thickening is also noted. Other: None. IMPRESSION: 1. No acute intracranial abnormality. 2. Generalized cerebral atrophy and microvascular disease changes of the supratentorial brain. 3. Chronic nondisplaced left-sided nasal bone fracture. 4. Paranasal sinus disease, as described above. Electronically Signed   By: Virgina Norfolk M.D.   On: 03/13/2022  03:04   DG Chest 1 View  Result Date: 03/12/2022 CLINICAL DATA:  Cough. EXAM: CHEST  1 VIEW COMPARISON:  March 01, 2022. FINDINGS: The heart size and mediastinal contours are within normal limits. Both lungs are clear. Status post left shoulder arthroplasty. IMPRESSION: No active disease. Electronically Signed   By: Marijo Conception M.D.   On: 03/12/2022 09:57    Microbiology: Results for orders placed or performed during the hospital encounter of 04/07/22  Blood Culture (routine x 2)     Status: None (Preliminary result)   Collection Time: 04/07/22  4:25 PM   Specimen: BLOOD  Result Value Ref Range Status   Specimen Description   Final    BLOOD LEFT ANTECUBITAL Performed at [facility] [address], [place], [place] [address]    Special Requests   Final    BOTTLES DRAWN AEROBIC AND ANAEROBIC Blood Culture adequate volume Performed at [facility] [address], [place], [place] [address]    Culture   Final    NO GROWTH < 12 HOURS Performed at [facility] [address] [place], [place] [address]    Report Status PENDING  Incomplete  Blood Culture (routine x 2)     Status: None (Preliminary result)   Collection Time: 04/07/22  4:45 PM   Specimen: BLOOD  Result Value Ref Range Status   Specimen Description   Final    BLOOD RIGHT ANTECUBITAL Performed at [facility] [address] [place], [place] [address]    Special Requests   Final    BOTTLES DRAWN AEROBIC AND ANAEROBIC Blood Culture adequate volume Performed at [facility] [address], [place], [place] [address]    Culture   Final    NO GROWTH < 12 HOURS Performed at [facility] [address], [place], [place] [address]    Report Status PENDING  Incomplete  Resp Panel by RT-PCR (Flu A&B, Covid) Anterior Nasal Swab     Status: None   Collection Time: 04/07/22  6:59 PM   Specimen: Anterior Nasal Swab  Result Value  Ref Range Status   SARS Coronavirus 2 by RT PCR NEGATIVE NEGATIVE Final    Comment: (NOTE) SARS-CoV-2 target nucleic acids are NOT DETECTED.  The SARS-CoV-2 RNA is generally detectable in upper respiratory specimens during the acute phase of infection. The lowest concentration of SARS-CoV-2 viral copies this assay can detect is 138 copies/mL. A negative result does not preclude SARS-Cov-2 infection and should not be used as the sole basis for treatment or other patient management decisions. A negative result may occur with  improper specimen collection/handling, submission of specimen other than nasopharyngeal swab, presence of viral mutation(s) within the areas targeted by this assay, and inadequate number of viral copies(<138 copies/mL). A negative result must be combined with clinical observations, patient history, and epidemiological information. The expected result is Negative.  Fact Sheet for Patients:  EntrepreneurPulse.com.au  Fact Sheet for Healthcare Providers:  IncredibleEmployment.be  This test is no t yet approved or cleared by the [place] FDA and  has been authorized for detection and/or diagnosis of SARS-CoV-2 by FDA  under an Emergency Use Authorization (EUA). This EUA will remain  in effect (meaning this test can be used) for the duration of the COVID-19 declaration under Section 564(b)(1) of the Act, 21 U.S.C.section 360bbb-3(b)(1), unless the authorization is terminated  or revoked sooner.       Influenza A by PCR NEGATIVE NEGATIVE Final   Influenza B by PCR NEGATIVE NEGATIVE Final    Comment: (NOTE) The Xpert Xpress SARS-CoV-2/FLU/RSV plus assay is intended as an aid in the diagnosis of influenza from Nasopharyngeal swab specimens and should not be used as a sole basis for treatment. Nasal washings and aspirates are unacceptable for Xpert Xpress SARS-CoV-2/FLU/RSV testing.  Fact Sheet for  Patients: EntrepreneurPulse.com.au  Fact Sheet for Healthcare Providers: IncredibleEmployment.be  This test is not yet approved or cleared by the [place] FDA and has been authorized for detection and/or diagnosis of SARS-CoV-2 by FDA under an Emergency Use Authorization (EUA). This EUA will remain in effect (meaning this test can be used) for the duration of the COVID-19 declaration under Section 564(b)(1) of the Act, 21 U.S.C. section 360bbb-3(b)(1), unless the authorization is terminated or revoked.  Performed at [facility], [place] [address], [place], [place] [address]     Labs: CBC: Recent Labs  Lab 04/07/22 1618 04/08/22 0233  WBC 10.1 9.7  NEUTROABS 9.0*  --   HGB 10.5* 10.1*  HCT 33.6* 33.1*  MCV 92.6 94.3  PLT 133* 408*   Basic Metabolic Panel: Recent Labs  Lab 04/07/22 1618 04/08/22 0233  NA 143 141  K 4.4 4.4  CL 106 108  CO2 28 26  GLUCOSE 78 75  BUN 28* 25*  CREATININE 1.32* 1.20  CALCIUM 9.4 9.2  MG  --  1.7  PHOS  --  4.0   Liver Function Tests: Recent Labs  Lab 04/07/22 1618 04/08/22 0233  AST 16 15  ALT 12 11  ALKPHOS 62 55  BILITOT 0.5 0.6  PROT 7.2 6.7  ALBUMIN 3.1* 2.7*   CBG: No results for input(s): "GLUCAP" in the last 168 hours.  Discharge time spent: approximately 35 minutes spent on discharge counseling, evaluation of patient on day of discharge, and coordination of discharge planning with nursing, social work, pharmacy and case management  Signed: Edwin Dada, MD [facility] 04/08/2022

## 2022-04-08 NOTE — NC FL2 (Signed)
[place] LEVEL OF CARE SCREENING TOOL     IDENTIFICATION  Patient Name: Jack Rivas Birthdate: 09/03/1945 Sex: male Admission Date (Current Location): 04/07/2022  [facility] and [place] Number:  Herbalist and Address:  [facility],  [place], [place]      Provider Number: [id number]  Attending Physician Name and Address:  Edwin Dada, *  Relative Name and Phone Number:  Paulina Fusi Daughter [phone]  [place] Daughter   [phone]    Current Level of Care: Hospital Recommended Level of Care: [place] Prior Approval Number:    Date Approved/Denied:   PASRR Number: [id number]  Discharge Plan: SNF    Current Diagnoses: Patient Active Problem List   Diagnosis Date Noted   Aspiration pneumonia ([place]) 04/07/2022   Septic shock due to undetermined organism ([place]) 03/01/2022   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   Debility 10/25/2021   Hypothermia 10/25/2021   Acute kidney injury superimposed on chronic kidney disease ([place]) 10/25/2021   Anemia of chronic disease 10/23/2021   Altered mental status 05/10/2021   Chronic kidney disease, stage 3a ([place]) 04/20/2021   Malnutrition of moderate degree 03/21/2021   Dementia ([place]) 03/20/2021   Dysphagia 03/20/2021    Orientation RESPIRATION BLADDER Height & Weight     Self  Normal Incontinent Weight: 192 lb 14.4 oz (87.5 kg) Height:  6\' 4"  (193 cm)  BEHAVIORAL SYMPTOMS/MOOD NEUROLOGICAL BOWEL NUTRITION STATUS      Incontinent Diet (Dysphagia 1 diet)  AMBULATORY STATUS COMMUNICATION OF NEEDS Skin   Extensive Assist Verbally Normal                       Personal Care Assistance Level of Assistance  Bathing, Feeding, Dressing Bathing Assistance: Maximum assistance Feeding assistance: Maximum assistance Dressing Assistance: Maximum assistance     Functional Limitations Info  Sight, Hearing, Speech Sight Info:  Adequate Hearing Info: Adequate Speech Info: Adequate    SPECIAL CARE FACTORS FREQUENCY                       Contractures Contractures Info: Not present    Additional Factors Info  Code Status, Allergies, Psychotropic Code Status Info: Partial Allergies Info: No Known Allergies Psychotropic Info: LORazepam (ATIVAN) tablet 0.5 mg         Current Medications (04/08/2022):  This is the current hospital active medication list Current Facility-Administered Medications  Medication Dose Route Frequency Provider Last Rate Last Admin   0.9 %  sodium chloride infusion   Intravenous Continuous Edwin Dada, MD 125 mL/hr at 04/08/22 1048 New Bag at 04/08/22 1048   acetaminophen (TYLENOL) tablet 650 mg  650 mg Per Tube Q6H PRN Marcelyn Bruins, MD       Or   acetaminophen (TYLENOL) suppository 650 mg  650 mg Rectal Q6H PRN Marcelyn Bruins, MD       ampicillin-sulbactam (UNASYN) 1.5 g in sodium chloride 0.9 % 100 mL IVPB  1.5 g Intravenous Q6H Marcelyn Bruins, MD 200 mL/hr at 04/08/22 0828 1.5 g at 04/08/22 0828   atropine 1 % ophthalmic solution 4 drop  4 drop Sublingual Q4H PRN Marcelyn Bruins, MD       enoxaparin (LOVENOX) injection 40 mg  40 mg Subcutaneous Q24H Marcelyn Bruins, MD   40 mg at 04/07/22 2123   free water 200 mL  200 mL Per Tube Q4H Marcelyn Bruins, MD  200 mL at 04/08/22 0758   LORazepam (ATIVAN) tablet 0.5 mg  0.5 mg Per Tube Daily Marcelyn Bruins, MD   0.5 mg at 04/08/22 1049   polyethylene glycol (MIRALAX / GLYCOLAX) packet 17 g  17 g Per Tube Daily Marcelyn Bruins, MD   17 g at 04/08/22 1049   sodium chloride flush (NS) 0.9 % injection 3 mL  3 mL Intravenous Q12H Marcelyn Bruins, MD   3 mL at 04/07/22 2125     Discharge Medications: Please see discharge summary for a list of discharge medications.  Relevant Imaging Results:  Relevant Lab Results:   Additional Information SSN [id number]  Ross Ludwig,  LCSW

## 2022-04-08 NOTE — Progress Notes (Signed)
Called Paulina Fusi, daughter on cell & home number. Cell didn't go through, left message on home number.

## 2022-04-08 NOTE — TOC Progression Note (Addendum)
Transition of Care [facility]) - Progression Note    Patient Details  Name: Jack Rivas MRN: [id number] Date of Birth: [birth year]  [facility] of [facility] CM/SW Contact  Ross Ludwig, [place] Phone Number: 04/08/2022, 11:30 AM  Clinical Narrative:     Patient is LTC at [facility] and receiving hospice services at [facility] under [place].  CSW left message on supervisor at [facility] voice mail to see if patient can return back today.  CSW awaiting for a call back.       Expected Discharge Plan and [facility] [facility] memory care.         Expected Discharge Date: 04/08/22                                     Social Determinants of Health (SDOH) Interventions    Readmission Risk Interventions    03/28/2021    3:34 PM  Readmission Risk Prevention Plan  Transportation Screening Complete  PCP or Specialist Appt within 3-5 Days Complete  HRI or [place] Complete  Social Work Consult for [place] Planning/Counseling Complete  Palliative Care Screening Not Applicable  Medication Review [employer]) Complete

## 2022-04-08 NOTE — Progress Notes (Signed)
PHARMACY - PHYSICIAN COMMUNICATION CRITICAL VALUE ALERT - BLOOD CULTURE IDENTIFICATION (BCID)  Jack Rivas is an 76 y.o. male who presented to [facility] on 04/07/2022 with a chief complaint of choking.  Assessment:  1 out 4 cultures MecA + Staph likely contaminant  Name of physician (or Provider) Contacted: Danford  Current antibiotics: discharge with Augmentin for aspiration  Changes to prescribed antibiotics recommended:  None  Results for orders placed or performed during the hospital encounter of 04/07/22  Blood Culture ID Panel (Reflexed) (Collected: 04/07/2022  4:25 PM)  Result Value Ref Range   Enterococcus faecalis NOT DETECTED NOT DETECTED   Enterococcus Faecium NOT DETECTED NOT DETECTED   Listeria monocytogenes NOT DETECTED NOT DETECTED   Staphylococcus species DETECTED (A) NOT DETECTED   Staphylococcus aureus (BCID) NOT DETECTED NOT DETECTED   Staphylococcus epidermidis DETECTED (A) NOT DETECTED   Staphylococcus lugdunensis NOT DETECTED NOT DETECTED   Streptococcus species NOT DETECTED NOT DETECTED   Streptococcus agalactiae NOT DETECTED NOT DETECTED   Streptococcus pneumoniae NOT DETECTED NOT DETECTED   Streptococcus pyogenes NOT DETECTED NOT DETECTED   A.calcoaceticus-baumannii NOT DETECTED NOT DETECTED   Bacteroides fragilis NOT DETECTED NOT DETECTED   Enterobacterales NOT DETECTED NOT DETECTED   Enterobacter cloacae complex NOT DETECTED NOT DETECTED   Escherichia coli NOT DETECTED NOT DETECTED   Klebsiella aerogenes NOT DETECTED NOT DETECTED   Klebsiella oxytoca NOT DETECTED NOT DETECTED   Klebsiella pneumoniae NOT DETECTED NOT DETECTED   Proteus species NOT DETECTED NOT DETECTED   Salmonella species NOT DETECTED NOT DETECTED   Serratia marcescens NOT DETECTED NOT DETECTED   Haemophilus influenzae NOT DETECTED NOT DETECTED   Neisseria meningitidis NOT DETECTED NOT DETECTED   Pseudomonas aeruginosa NOT DETECTED NOT DETECTED   Stenotrophomonas maltophilia NOT  DETECTED NOT DETECTED   Candida albicans NOT DETECTED NOT DETECTED   Candida auris NOT DETECTED NOT DETECTED   Candida glabrata NOT DETECTED NOT DETECTED   Candida krusei NOT DETECTED NOT DETECTED   Candida parapsilosis NOT DETECTED NOT DETECTED   Candida tropicalis NOT DETECTED NOT DETECTED   Cryptococcus neoformans/gattii NOT DETECTED NOT DETECTED   Methicillin resistance mecA/C DETECTED (A) NOT DETECTED    Lewanda Perea S. Alford Highland, PharmD, BCPS Clinical Staff Pharmacist [email or web address] [facility] 04/08/2022  12:36 PM

## 2022-04-08 NOTE — TOC Transition Note (Addendum)
Transition of Care [facility]) - CM/SW Discharge Note   Patient Details  Name: Jack Rivas MRN: [id number] Date of Birth: 09-23-1945  Transition of Care [facility]) CM/SW Contact:  Ross Ludwig, LCSW Phone Number: 04/08/2022, 1:29 PM   Clinical Narrative:     CSW spoke to [facility] admissions worker at [facility].  Per [facility] she can accept patient back today.  She requested that [facility] and discharge summary be faxed to [phone].  CSW faxed requested information, CSW received confirmation that it was successful.  CSW spoke to patient's daughter Jack Rivas who said she was not happy with [facility].  CSW explained to her that patient is under memory care, LTC, and hospice services through [facility].  CSW explained to her that unfortunately there are not any other facilities that would be able to accept patient.  Per patient's daughter she says she did not know patient was under hospice through [place].  CSW spoke to [place] at  [facility] admissions worker at [facility], and they said patient's daughter has been aware that he is under hospice services.  He has been for nearly 6 months.    Per admissions worker DON and admissions just spoke to patient's daughter yesterday.  Patient's daughter was angry about the information, and CSW offered to provide her the number to call the state, she said she already has it.  CSW advised her to speak to SNF to discuss trying to find an alternative placement for patient, she said she will.  CSW updated attending physician and [facility] rep, of the conversation.  Physician asked if he could get the number for the DON at [facility] so he can talk to her, CSW provided him with the name and number of [facility]ie [place], [phone] the DON at SNF.  Physician updated CSW that he spoke to [facility] and daughter, and plan is to still discharge patient back to SNF under hospice services.  Patient to be d/c'ed today to [facility] memory care unit.  Patient and  family agreeable to plans will transport via  [place] to call report to [phone] and ask to speak to [facility] unit nurse.  Daughter aware that patient is discharging back to SNF today.     Final next level of care: Memory Care (Hospice at [facility]) Barriers to Discharge: Barriers Resolved   Patient Goals and CMS Choice Patient states their goals for this hospitalization and ongoing recovery are:: To return back to [facility] Unit. CMS Medicare.gov Compare Post Acute Care list provided to:: Patient Represenative (must comment) Choice offered to / list presented to : [facility] POA / Guardian, Adult Children  Discharge Placement   Existing PASRR number confirmed : 04/08/22          Patient chooses bed at: [facility] Patient to be transferred to facility by: [facility] EMS Name of family member notified: Daughter Jack Rivas [phone] Patient and family notified of of transfer: 04/08/22  Discharge Plan and Services                                     Social Determinants of Health (SDOH) Interventions     Readmission Risk Interventions    03/28/2021    3:34 PM  Readmission Risk Prevention Plan  Transportation Screening Complete  PCP or Specialist Appt within 3-5 Days Complete  HRI or [place] Complete  Social Work  Consult for Recovery Care Planning/Counseling Complete  Palliative Care Screening Not Applicable  Medication Review (RN Care Manager) Complete

## 2022-04-10 LAB — CULTURE, BLOOD (ROUTINE X 2)
Special Requests: ADEQUATE
Special Requests: ADEQUATE

## 2022-04-10 NOTE — Progress Notes (Signed)
Blood cultures at the time of discharge growing Staph epi in 1 of 2 bottles, suspected to be contaminant.  Post-discharge, the second blood culture set (from a different site) also resulted this morning growing same Staph epi. Given this additional information, chance that it is a pathogen increases. Source would likely be the lung.  In light of his severe decline prior to admission over weeks, and overall course and prognosis, there is a high degree of certainty that readmission for IV antibiotics would provide no improvement in quality or quantity of life.  I discussed the new finding of blood cultures with Hospice liaison, who will forward to LTC team.  They can consider transition from discharge antibiotic Augmentin to Bactrim or Doxycycline.

## 2022-04-26 ENCOUNTER — Encounter (HOSPITAL_COMMUNITY): Payer: Self-pay

## 2022-04-26 ENCOUNTER — Emergency Department (HOSPITAL_COMMUNITY)

## 2022-04-26 ENCOUNTER — Inpatient Hospital Stay (HOSPITAL_COMMUNITY)
Admission: EM | Admit: 2022-04-26 | Discharge: 2022-04-28 | DRG: 871 | Disposition: A | Discharge status: Skilled Nursing Facility | Source: Skilled Nursing Facility | Attending: Internal Medicine | Admitting: Internal Medicine

## 2022-04-26 ENCOUNTER — Inpatient Hospital Stay (HOSPITAL_COMMUNITY)

## 2022-04-26 ENCOUNTER — Other Ambulatory Visit: Payer: Self-pay

## 2022-04-26 DIAGNOSIS — J189 Pneumonia, unspecified organism: Secondary | ICD-10-CM | POA: Diagnosis present

## 2022-04-26 DIAGNOSIS — Z6823 Body mass index (BMI) 23.0-23.9, adult: Secondary | ICD-10-CM

## 2022-04-26 DIAGNOSIS — G9341 Metabolic encephalopathy: Secondary | ICD-10-CM | POA: Diagnosis present

## 2022-04-26 DIAGNOSIS — R6520 Severe sepsis without septic shock: Secondary | ICD-10-CM | POA: Diagnosis present

## 2022-04-26 DIAGNOSIS — D631 Anemia in chronic kidney disease: Secondary | ICD-10-CM | POA: Diagnosis present

## 2022-04-26 DIAGNOSIS — F0390 Unspecified dementia without behavioral disturbance: Secondary | ICD-10-CM | POA: Diagnosis present

## 2022-04-26 DIAGNOSIS — N189 Chronic kidney disease, unspecified: Secondary | ICD-10-CM | POA: Diagnosis present

## 2022-04-26 DIAGNOSIS — N1831 Chronic kidney disease, stage 3a: Secondary | ICD-10-CM | POA: Diagnosis present

## 2022-04-26 DIAGNOSIS — N179 Acute kidney failure, unspecified: Secondary | ICD-10-CM | POA: Diagnosis present

## 2022-04-26 DIAGNOSIS — J690 Pneumonitis due to inhalation of food and vomit: Secondary | ICD-10-CM | POA: Diagnosis present

## 2022-04-26 DIAGNOSIS — Z8342 Family history of familial hypercholesterolemia: Secondary | ICD-10-CM | POA: Diagnosis not present

## 2022-04-26 DIAGNOSIS — I4892 Unspecified atrial flutter: Secondary | ICD-10-CM | POA: Diagnosis present

## 2022-04-26 DIAGNOSIS — Z79899 Other long term (current) drug therapy: Secondary | ICD-10-CM | POA: Diagnosis not present

## 2022-04-26 DIAGNOSIS — N1832 Chronic kidney disease, stage 3b: Secondary | ICD-10-CM | POA: Diagnosis present

## 2022-04-26 DIAGNOSIS — Z515 Encounter for palliative care: Secondary | ICD-10-CM | POA: Diagnosis not present

## 2022-04-26 DIAGNOSIS — A419 Sepsis, unspecified organism: Secondary | ICD-10-CM | POA: Diagnosis present

## 2022-04-26 DIAGNOSIS — E440 Moderate protein-calorie malnutrition: Secondary | ICD-10-CM | POA: Diagnosis present

## 2022-04-26 DIAGNOSIS — F17200 Nicotine dependence, unspecified, uncomplicated: Secondary | ICD-10-CM | POA: Diagnosis present

## 2022-04-26 DIAGNOSIS — R627 Adult failure to thrive: Secondary | ICD-10-CM | POA: Diagnosis present

## 2022-04-26 DIAGNOSIS — I129 Hypertensive chronic kidney disease with stage 1 through stage 4 chronic kidney disease, or unspecified chronic kidney disease: Secondary | ICD-10-CM | POA: Diagnosis present

## 2022-04-26 DIAGNOSIS — D638 Anemia in other chronic diseases classified elsewhere: Secondary | ICD-10-CM | POA: Diagnosis not present

## 2022-04-26 DIAGNOSIS — Z1152 Encounter for screening for COVID-19: Secondary | ICD-10-CM | POA: Diagnosis not present

## 2022-04-26 DIAGNOSIS — Z931 Gastrostomy status: Secondary | ICD-10-CM | POA: Diagnosis not present

## 2022-04-26 DIAGNOSIS — R5381 Other malaise: Secondary | ICD-10-CM | POA: Diagnosis not present

## 2022-04-26 DIAGNOSIS — F0150 Vascular dementia without behavioral disturbance: Secondary | ICD-10-CM

## 2022-04-26 DIAGNOSIS — R1310 Dysphagia, unspecified: Secondary | ICD-10-CM | POA: Diagnosis present

## 2022-04-26 LAB — LACTIC ACID, PLASMA
Lactic Acid, Venous: 2.2 mmol/L (ref 0.5–1.9)
Lactic Acid, Venous: 1.7 mmol/L (ref 0.5–1.9)

## 2022-04-26 LAB — RESP PANEL BY RT-PCR (FLU A&B, COVID) ARPGX2
Influenza A by PCR: NEGATIVE
Influenza B by PCR: NEGATIVE
SARS Coronavirus 2 by RT PCR: NEGATIVE

## 2022-04-26 LAB — URINALYSIS, ROUTINE W REFLEX MICROSCOPIC
Bilirubin Urine: NEGATIVE
Glucose, UA: NEGATIVE mg/dL
Ketones, ur: NEGATIVE mg/dL
Leukocytes,Ua: NEGATIVE
Nitrite: NEGATIVE
Protein, ur: NEGATIVE mg/dL
Specific Gravity, Urine: 1.004 — ABNORMAL LOW (ref 1.005–1.030)
pH: 7 (ref 5.0–8.0)

## 2022-04-26 LAB — CBC WITH DIFFERENTIAL/PLATELET
Abs Immature Granulocytes: 0.03 10*3/uL (ref 0.00–0.07)
Basophils Absolute: 0 10*3/uL (ref 0.0–0.1)
Basophils Relative: 1 %
Eosinophils Absolute: 0 10*3/uL (ref 0.0–0.5)
Eosinophils Relative: 0 %
HCT: 29.4 % — ABNORMAL LOW (ref 39.0–52.0)
Hemoglobin: 9.4 g/dL — ABNORMAL LOW (ref 13.0–17.0)
Immature Granulocytes: 1 %
Lymphocytes Relative: 23 %
Lymphs Abs: 1.4 10*3/uL (ref 0.7–4.0)
MCH: 29.6 pg (ref 26.0–34.0)
MCHC: 32 g/dL (ref 30.0–36.0)
MCV: 92.5 fL (ref 80.0–100.0)
Monocytes Absolute: 0.4 10*3/uL (ref 0.1–1.0)
Monocytes Relative: 7 %
Neutro Abs: 4.4 10*3/uL (ref 1.7–7.7)
Neutrophils Relative %: 68 %
Platelets: 147 10*3/uL — ABNORMAL LOW (ref 150–400)
RBC: 3.18 MIL/uL — ABNORMAL LOW (ref 4.22–5.81)
RDW: 14.2 % (ref 11.5–15.5)
WBC: 6.3 10*3/uL (ref 4.0–10.5)
nRBC: 0 % (ref 0.0–0.2)

## 2022-04-26 LAB — COMPREHENSIVE METABOLIC PANEL
ALT: 18 U/L (ref 0–44)
AST: 50 U/L — ABNORMAL HIGH (ref 15–41)
Albumin: 2.5 g/dL — ABNORMAL LOW (ref 3.5–5.0)
Alkaline Phosphatase: 58 U/L (ref 38–126)
Anion gap: 12 (ref 5–15)
BUN: 55 mg/dL — ABNORMAL HIGH (ref 8–23)
CO2: 26 mmol/L (ref 22–32)
Calcium: 9.6 mg/dL (ref 8.9–10.3)
Chloride: 102 mmol/L (ref 98–111)
Creatinine, Ser: 3.78 mg/dL — ABNORMAL HIGH (ref 0.61–1.24)
GFR, Estimated: 16 mL/min — ABNORMAL LOW (ref >60)
Glucose, Bld: 78 mg/dL (ref 70–99)
Potassium: 4.5 mmol/L (ref 3.5–5.1)
Sodium: 140 mmol/L (ref 135–145)
Total Bilirubin: 1.4 mg/dL — ABNORMAL HIGH (ref 0.3–1.2)
Total Protein: 7 g/dL (ref 6.5–8.1)

## 2022-04-26 LAB — PROTIME-INR
INR: 1.4 — ABNORMAL HIGH (ref 0.8–1.2)
Prothrombin Time: 17 seconds — ABNORMAL HIGH (ref 11.4–15.2)

## 2022-04-26 LAB — MAGNESIUM: Magnesium: 1.6 mg/dL — ABNORMAL LOW (ref 1.7–2.4)

## 2022-04-26 LAB — STREP PNEUMONIAE URINARY ANTIGEN: Strep Pneumo Urinary Antigen: NEGATIVE

## 2022-04-26 LAB — APTT: aPTT: 49 seconds — ABNORMAL HIGH (ref 24–36)

## 2022-04-26 LAB — SODIUM, URINE, RANDOM: Sodium, Ur: 41 mmol/L

## 2022-04-26 LAB — CBG MONITORING, ED: Glucose-Capillary: 66 mg/dL — ABNORMAL LOW (ref 70–99)

## 2022-04-26 LAB — MRSA NEXT GEN BY PCR, NASAL: MRSA by PCR Next Gen: DETECTED — AB

## 2022-04-26 LAB — CREATININE, URINE, RANDOM: Creatinine, Urine: 33 mg/dL

## 2022-04-26 MED ORDER — SODIUM CHLORIDE 0.9 % IV SOLN
2.0000 g | INTRAVENOUS | Status: DC
  Administered 2022-04-26 – 2022-04-27 (×2): 2 g via INTRAVENOUS
  Filled 2022-04-26 (×2): qty 20

## 2022-04-26 MED ORDER — MORPHINE SULFATE (CONCENTRATE) 10 MG/0.5ML PO SOLN: 5.0000 mg | ORAL | Status: DC | PRN

## 2022-04-26 MED ORDER — VALPROIC ACID 250 MG/5ML PO SOLN
250.0000 mg | Freq: Every day | ORAL | Status: DC
  Filled 2022-04-26: qty 5

## 2022-04-26 MED ORDER — ORAL CARE MOUTH RINSE: 15.0000 mL | OROMUCOSAL | Status: DC | PRN

## 2022-04-26 MED ORDER — RISPERIDONE 1 MG PO TABS
0.5000 mg | ORAL_TABLET | Freq: Every day | ORAL | Status: DC
  Filled 2022-04-26: qty 1

## 2022-04-26 MED ORDER — ONDANSETRON HCL 4 MG/2ML IJ SOLN: 4.0000 mg | Freq: Four times a day (QID) | INTRAMUSCULAR | Status: DC | PRN

## 2022-04-26 MED ORDER — UMECLIDINIUM BROMIDE 62.5 MCG/ACT IN AEPB
1.0000 | INHALATION_SPRAY | Freq: Every day | RESPIRATORY_TRACT | Status: DC
  Filled 2022-04-26: qty 7

## 2022-04-26 MED ORDER — SODIUM CHLORIDE 0.9 % IV SOLN
2.0000 g | Freq: Once | INTRAVENOUS | Status: AC
  Administered 2022-04-26: 2 g via INTRAVENOUS
  Filled 2022-04-26: qty 12.5

## 2022-04-26 MED ORDER — ONDANSETRON HCL 4 MG PO TABS: 4.0000 mg | ORAL_TABLET | Freq: Four times a day (QID) | ORAL | Status: DC | PRN

## 2022-04-26 MED ORDER — HYDRALAZINE HCL 20 MG/ML IJ SOLN: 10.0000 mg | Freq: Four times a day (QID) | INTRAMUSCULAR | Status: DC | PRN

## 2022-04-26 MED ORDER — MAGNESIUM SULFATE 4 GM/100ML IV SOLN
4.0000 g | Freq: Once | INTRAVENOUS | Status: AC
  Administered 2022-04-26: 4 g via INTRAVENOUS
  Filled 2022-04-26: qty 100

## 2022-04-26 MED ORDER — ACETAMINOPHEN 325 MG PO TABS: 650.0000 mg | ORAL_TABLET | Freq: Four times a day (QID) | ORAL | Status: DC | PRN

## 2022-04-26 MED ORDER — LACTATED RINGERS IV SOLN: INTRAVENOUS | Status: DC

## 2022-04-26 MED ORDER — CHLORHEXIDINE GLUCONATE CLOTH 2 % EX PADS
6.0000 | MEDICATED_PAD | Freq: Every day | CUTANEOUS | Status: DC
  Administered 2022-04-26 – 2022-04-27 (×2): 6 via TOPICAL

## 2022-04-26 MED ORDER — SODIUM CHLORIDE 0.9% FLUSH
3.0000 mL | Freq: Two times a day (BID) | INTRAVENOUS | Status: DC
  Administered 2022-04-26 – 2022-04-28 (×4): 3 mL via INTRAVENOUS

## 2022-04-26 MED ORDER — MAGNESIUM CITRATE PO SOLN: 1.0000 | Freq: Once | ORAL | Status: DC | PRN

## 2022-04-26 MED ORDER — LACTATED RINGERS IV BOLUS (SEPSIS)
1000.0000 mL | Freq: Once | INTRAVENOUS | Status: AC
  Administered 2022-04-26: 1000 mL via INTRAVENOUS

## 2022-04-26 MED ORDER — SORBITOL 70 % SOLN: 30.0000 mL | Freq: Every day | Status: DC | PRN

## 2022-04-26 MED ORDER — LEVALBUTEROL HCL 0.63 MG/3ML IN NEBU: 0.6300 mg | INHALATION_SOLUTION | Freq: Four times a day (QID) | RESPIRATORY_TRACT | Status: DC | PRN

## 2022-04-26 MED ORDER — SODIUM CHLORIDE 0.9 % IV SOLN
500.0000 mg | INTRAVENOUS | Status: DC
  Administered 2022-04-26 – 2022-04-27 (×2): 500 mg via INTRAVENOUS
  Filled 2022-04-26 (×2): qty 5

## 2022-04-26 MED ORDER — VALPROIC ACID 250 MG/5ML PO SOLN
250.0000 mg | Freq: Every day | ORAL | Status: DC
  Administered 2022-04-26 – 2022-04-27 (×2): 250 mg
  Filled 2022-04-26 (×2): qty 5

## 2022-04-26 MED ORDER — ACETAMINOPHEN 650 MG RE SUPP
650.0000 mg | Freq: Once | RECTAL | Status: AC
  Administered 2022-04-26: 650 mg via RECTAL
  Filled 2022-04-26: qty 1

## 2022-04-26 MED ORDER — VANCOMYCIN HCL IN DEXTROSE 1-5 GM/200ML-% IV SOLN
1000.0000 mg | Freq: Once | INTRAVENOUS | Status: AC
  Administered 2022-04-26: 1000 mg via INTRAVENOUS
  Filled 2022-04-26: qty 200

## 2022-04-26 MED ORDER — ENOXAPARIN SODIUM 40 MG/0.4ML IJ SOSY: 40.0000 mg | PREFILLED_SYRINGE | INTRAMUSCULAR | Status: DC

## 2022-04-26 MED ORDER — ORAL CARE MOUTH RINSE
15.0000 mL | OROMUCOSAL | Status: DC
  Administered 2022-04-26 – 2022-04-28 (×7): 15 mL via OROMUCOSAL

## 2022-04-26 MED ORDER — VANCOMYCIN HCL IN DEXTROSE 1-5 GM/200ML-% IV SOLN
1000.0000 mg | INTRAVENOUS | Status: DC
  Administered 2022-04-28: 1000 mg via INTRAVENOUS
  Filled 2022-04-26: qty 200

## 2022-04-26 MED ORDER — RISPERIDONE 1 MG PO TABS
0.5000 mg | ORAL_TABLET | Freq: Every day | ORAL | Status: DC
  Administered 2022-04-26 – 2022-04-27 (×2): 0.5 mg
  Filled 2022-04-26: qty 1

## 2022-04-26 MED ORDER — POLYETHYLENE GLYCOL 3350 17 G PO PACK: 17.0000 g | PACK | Freq: Every day | ORAL | Status: DC | PRN

## 2022-04-26 MED ORDER — ENOXAPARIN SODIUM 30 MG/0.3ML IJ SOSY
30.0000 mg | PREFILLED_SYRINGE | INTRAMUSCULAR | Status: DC
  Administered 2022-04-26 – 2022-04-27 (×2): 30 mg via SUBCUTANEOUS
  Filled 2022-04-26 (×2): qty 0.3

## 2022-04-26 MED ORDER — SODIUM CHLORIDE 0.9 % IV BOLUS
500.0000 mL | Freq: Once | INTRAVENOUS | Status: AC
  Administered 2022-04-27: 500 mL via INTRAVENOUS

## 2022-04-26 MED ORDER — FREE WATER
200.0000 mL | Status: DC
  Administered 2022-04-26 – 2022-04-28 (×11): 200 mL

## 2022-04-26 MED ORDER — GUAIFENESIN ER 600 MG PO TB12
1200.0000 mg | ORAL_TABLET | Freq: Two times a day (BID) | ORAL | Status: DC
  Filled 2022-04-26: qty 2

## 2022-04-26 MED ORDER — NICOTINE 14 MG/24HR TD PT24
14.0000 mg | MEDICATED_PATCH | Freq: Every day | TRANSDERMAL | Status: DC
  Administered 2022-04-26 – 2022-04-28 (×3): 14 mg via TRANSDERMAL
  Filled 2022-04-26 (×3): qty 1

## 2022-04-26 MED ORDER — ACETAMINOPHEN 650 MG RE SUPP: 650.0000 mg | Freq: Four times a day (QID) | RECTAL | Status: DC | PRN

## 2022-04-26 NOTE — Progress Notes (Signed)
Manufacturing engineer [name]) Hospice  Jack Rivas is a current hospice patient.  He is still a full code and has a DSS agent.  Please direct all questions regarding his code status to: [place], office [phone], cell [phone].  Thank you, Venia Carbon DNP, RN [facility] Liaison ([place] chat)

## 2022-04-26 NOTE — ED Provider Notes (Signed)
[facility]-EMERGENCY DEPT Provider Note   CSN: [id number] Arrival date & time: 04/26/22  1522     History  Chief Complaint  Patient presents with   Altered Mental Status    Jack Rivas is a 76 y.o. male.  HPI Patient is transported from [facility] facility.  He is reportedly under hospice care.  Hospice nurse notified staff that the patient had declining condition.  Per EMS, patient's roommate notes that about the past 3 days he has not been doing well.  No specific details on change but decreased activity level.  At baseline there is report that the patient rolls himself around in a wheelchair and interacts verbally.  I have reviewed the EMR and indicates patient has had significant recurrent decline over the past number of months with recurrent aspiration pneumonia, advanced dementia with behavioral disturbance and failure to thrive.  Patient cannot give any history.  EMS reports on his baseline 4 L of oxygen patient's oxygen saturation was 81%.  They placed him on nonrebreather mask and came up to 93%.  They report systolic blood pressure was 63.  They were unable to obtain peripheral access due to patient and agitated and moving his arms a lot.  They report his oxygen saturation improved, the patient became more physically active and agitated.    Home Medications Prior to Admission medications   Medication Sig Start Date End Date Taking? Authorizing Provider  acetaminophen (TYLENOL) 160 MG/5ML suspension Place 10.2 mLs (325 mg total) into feeding tube every 6 (six) hours as needed for mild pain, headache or fever. 03/06/22   Ghimire, Werner Lean, MD  amoxicillin-clavulanate (AUGMENTIN) 875-125 MG tablet Take 1 tablet by mouth 2 (two) times daily. 04/08/22   Danford, Earl Lites, MD  atropine 1 % ophthalmic solution Place 4 drops under the tongue every 4 (four) hours as needed (secretions).    [provider]  diclofenac Sodium (VOLTAREN) 1 % GEL Apply 2 g  topically 4 (four) times daily. 03/06/22   Ghimire, Werner Lean, MD  LORazepam (ATIVAN) 0.5 MG tablet Place 1 tablet (0.5 mg total) into feeding tube daily. 03/16/22   Hughie Closs, MD  Nutritional Supplements (FEEDING SUPPLEMENT, JEVITY 1.5 CAL/FIBER,) LIQD Place 1,000 mLs into feeding tube continuous. At 55 ml/hr 03/06/22   Ghimire, Werner Lean, MD  polyethylene glycol powder (GLYCOLAX/MIRALAX) 17 GM/SCOOP powder Place 17 g into feeding tube daily. 03/06/22   Ghimire, Werner Lean, MD  Protein (FEEDING SUPPLEMENT, PROSOURCE ON62,) liquid Place 60 mLs into feeding tube daily. 03/06/22 06/04/22  GhimireWerner Lean, MD  valproic acid (DEPAKENE) 250 MG/5ML solution Take 250 mg by mouth at bedtime.    [provider]  Water For Irrigation, Sterile (FREE WATER) SOLN Place 200 mLs into feeding tube every 4 (four) hours. 03/06/22 06/04/22  Ghimire, Werner Lean, MD      Allergies    Patient has no known allergies.    Review of Systems   Review of Systems  Physical Exam Updated Vital Signs SpO2 (!) 81%  Physical Exam Constitutional:      Comments: Patient is alert.  He has a nonrebreather mask in place.  Eyes are open.  He has advanced cachexia appearance of confusion.  He is sometimes shaking his arms aggressively.  Mild to moderate increased work of breathing at rest.  HENT:     Mouth/Throat:     Comments: Patient's mouth is staying in a wide open position, extremely dry mucosa throughout.  Poor dentition. Eyes:  Extraocular Movements: Extraocular movements intact.  Cardiovascular:     Comments: Tachycardia.  Distant heart sounds. Pulmonary:     Comments: Mild to moderate increased work of breathing.  Occasional rhonchi.  Breath sounds grossly symmetric. Abdominal:     General: There is no distension.     Palpations: Abdomen is soft.     Tenderness: There is no abdominal tenderness. There is no guarding.     Comments: Feeding tube in place.  Patient does not seem to have pain with palpation  abdomen.  Patient's abdomen is soft without distention or guarding.  Musculoskeletal:     Comments: No significant peripheral edema.  Skin is very dry and thin.  Some very superficial abrasions but no appearance of cellulitis, ulcers or significant wounds on the lower legs or feet.  Skin:    General: Skin is warm and dry.  Neurological:     Comments: Patient is keeping his eyes wide open.  He is sometimes shaking his arms and a bit of a repetitive motion that seems to reflect agitation but not seizure-like activity.  He is not making any meaningful verbal responses to questions.     ED Results / Procedures / Treatments   Labs (all labs ordered are listed, but only abnormal results are displayed) Labs Reviewed  RESP PANEL BY RT-PCR (FLU A&B, COVID) ARPGX2  CULTURE, BLOOD (ROUTINE X 2)  CULTURE, BLOOD (ROUTINE X 2)  URINE CULTURE  LACTIC ACID, PLASMA  LACTIC ACID, PLASMA  COMPREHENSIVE METABOLIC PANEL  CBC WITH DIFFERENTIAL/PLATELET  PROTIME-INR  APTT  URINALYSIS, ROUTINE W REFLEX MICROSCOPIC    EKG EKG Interpretation  Date/Time:  Wednesday April 26 2022 15:48:06 EDT Ventricular Rate:  122 PR Interval:    QRS Duration: 99 QT Interval:  275 QTC Calculation: 392 R Axis:   -10 Text Interpretation: Atrial fibrillation Borderline low voltage, extremity leads Borderline repolarization abnormality rapid afib since previous Confirmed by Arby Barrette [phone]) on 04/26/2022 4:28:20 PM  Radiology US RENAL  Result Date: 04/26/2022 CLINICAL DATA:  Acute renal failure EXAM: RENAL / URINARY TRACT ULTRASOUND COMPLETE COMPARISON:  CT 03/13/2022 FINDINGS: Right Kidney: Renal measurements: 10.7 x 3.8 x 4.9 cm = volume: 105 mL. Normal parenchymal echotexture and thickness. Simple appearing cyst measuring 1.5 cm in diameter. No imaging follow-up is indicated. No hydronephrosis or solid mass. Left Kidney: Renal measurements: 10.1 x 5 x 4.7 cm = volume: 126 mL. Echogenicity within normal  limits. No mass or hydronephrosis visualized. Bladder: Bladder is decompressed with a Foley catheter. Other: None. IMPRESSION: Normal ultrasound appearance of the kidneys. No hydronephrosis. Bladder is decompressed with a Foley catheter. Electronically Signed   By: Burman Nieves M.D.   On: 04/26/2022 19:05   DG Chest Port 1 View  Result Date: 04/26/2022 CLINICAL DATA:  Possible sepsis. EXAM: PORTABLE CHEST 1 VIEW COMPARISON:  04/07/2022 FINDINGS: Persistent right base collapse/consolidation with new airspace disease noted in the left base. Probable layering small bilateral pleural effusions. The cardio pericardial silhouette is enlarged. Telemetry leads overlie the chest. Status post left shoulder replacement. IMPRESSION: Persistent right base collapse/consolidation with new airspace disease in the left base. Probable layering bilateral pleural effusions. Electronically Signed   By: Kennith Center M.D.   On: 04/26/2022 16:19    Procedures Procedures   CRITICAL CARE Performed by: Arby Barrette   Total critical care time: 30 minutes  Critical care time was exclusive of separately billable procedures and treating other patients.  Critical care was necessary to treat or prevent  imminent or life-threatening deterioration.  Critical care was time spent personally by me on the following activities: development of treatment plan with patient and/or surrogate as well as nursing, discussions with consultants, evaluation of patient's response to treatment, examination of patient, obtaining history from patient or surrogate, ordering and performing treatments and interventions, ordering and review of laboratory studies, ordering and review of radiographic studies, pulse oximetry and re-evaluation of patient's condition.  Medications Ordered in ED Medications  lactated ringers infusion (has no administration in time range)  lactated ringers bolus 1,000 mL (has no administration in time range)    And   lactated ringers bolus 1,000 mL (has no administration in time range)    And  lactated ringers bolus 1,000 mL (has no administration in time range)  vancomycin (VANCOCIN) IVPB 1000 mg/200 mL premix (has no administration in time range)  ceFEPIme (MAXIPIME) 2 g in sodium chloride 0.9 % 100 mL IVPB (has no administration in time range)    ED Course/ Medical Decision Making/ A&P                           Medical Decision Making Amount and/or Complexity of Data Reviewed Labs: ordered. Radiology: ordered. ECG/medicine tests: ordered.  Risk Prescription drug management. Decision regarding hospitalization.   I have reviewed EMR.  Patient has had recurrent aspiration pneumonia and sepsis.  Arrival EMS identified hypotension, hypoxia and suspected sepsis.  Patient is breathing spontaneously and oxygen saturation in mid 90s with nonrebreather mask.  At this time do not think he would benefit from positive pressure respiratory assist.  Continue with supplemental oxygen.  Initiate sepsis protocol for fluid resuscitation but hypotension and antibiotics for high suspicion of aspiration pneumonia.  Patient significant elevation of BUN and creatinine from baseline consistent with dehydration and AKI with sepsis.  Lactic acid is 1.7.  White count 6.3.  X-ray by radiology shows new left-sided consolidation concerning for new pneumonia other chronic findings.  16: 30 with hydration, blood pressure has improved to 106/55 and heart rate has come down to 109.  Patient is responding well.  Will administer Tylenol for fever.  Findings are consistent with sepsis.  Patient is febrile to 101.6, chest x-ray shows areas consistent with pneumonia.  Plan for admission.  Consult: Reviewed with [facility] hospitalist for admission.        Final Clinical Impression(s) / ED Diagnoses Final diagnoses:  Sepsis with acute renal failure, due to unspecified organism, unspecified acute renal failure type, unspecified  whether septic shock present [facility])    Rx / Ignacio Orders ED Discharge Orders     None         Charlesetta Shanks, MD 04/27/22 1847

## 2022-04-26 NOTE — H&P (Addendum)
History and Physical    Jack Rivas [id number] DOB: [birth year] DOA: 04/26/2022  PCP: [name], MD  Patient coming from: [facility]  I have personally briefly reviewed patient's old medical records in [place]  Chief Complaint: Altered mental status/hypoxia/hypotension  HPI: Jack Rivas is a 76 y.o. male with medical history significant of malnutrition with dysphagia, recurrent aspiration secondary to worsening dementia on hospice recently hospitalized 04/07/2022-04/08/2022 for aspiration pneumonia, failure to thrive.  Patient with prior history of PEG placement 2 years ago in the context of prolonged mechanical ventilation requiring trach and PEG, patient currently not using PEG tube for feeds and has been on modified diet under the care of speech therapy at times noted in the last few months to have accelerating aspiration pneumonias, worsening functional status and enrolled in hospice. -Patient with history of dementia, poor historian and sent to the ED from [facility].  Unable to obtain any history from patient as patient currently nonverbal but arousable. Per ED physician's note and chart review patient presenting per EMS from [place] facility, patient's roommate noted that over the past 3 days patient not doing too well, has become nonverbal.  It is noted at baseline that patient usually rolls himself around in the wheelchair and interacts verbally.  It is noted that patient has had recurrent decline over the past few months with recurrent aspiration pneumonia events, advanced dementia with behavioral disturbance and failure to thrive.  It is noted that patient has a baseline O2 requirements of 4 L of O2 and noted when EMS got a sats of 81%.  Patient placed on a nonrebreather and sats came up to 93%.  Patient also noted to have systolic blood pressures in the 60s and peripheral access unable to be obtained due to agitation.  ED Course: Patient seen  in the ED, noted to have a temperature of 101.6, initially tachycardic with heart rates as high as 127, tachypneic with respiratory rate of 25, initial blood pressure on presentation was 68/55 with sats of 81%.  Patient placed on a nonrebreather with 15 L with sats currently at 100%.  Patient also placed on IV fluids with improvement with hypotension.  Comprehensive metabolic profile obtained with a BUN of 55, creatinine of 3.78, albumin of 2.5, AST of 50, bilirubin of 1.4 otherwise within normal limits.  Lactic acid noted elevated at 2.2. CBC with a hemoglobin of 9.4, platelet count of 147 otherwise was within normal limits.  Urinalysis pending.  Chest x-ray done with persistent right base collapse/consolidation with new airspace disease in the left base.  Probable layering bilateral pleural effusions.  Patient placed on aggressive IV fluid resuscitation.  Blood cultures ordered.  Urinalysis ordered and pending.  Patient given IV vancomycin and IV cefepime in the ED.  Hospitalist were called to admit the patient for further evaluation and management.  Sepsis protocol initiated in the ED.  Review of Systems: As per HPI otherwise all other systems reviewed and are negative.  Past Medical History:  Diagnosis Date   Aspiration pneumonia ([place]) 03/20/2021   Dementia ([place])    HCAP (healthcare-associated pneumonia) 04/20/2021   Hypertension    Hypoglycemia 10/25/2021   Severe sepsis ([place]) 04/20/2021   Uses feeding tube     Past Surgical History:  Procedure Laterality Date   CATARACT EXTRACTION, BILATERAL     KNEE SURGERY      Social History  reports that he has been smoking. He has never used smokeless tobacco.  He reports that he does not currently use alcohol. He reports that he does not use drugs.  No Known Allergies  History reviewed. No pertinent family history. Unable to obtain due to patient's dementia and nonverbal state at this time.  Prior to Admission medications   Medication Sig Start  Date End Date Taking? Authorizing Provider  acetaminophen (TYLENOL) 325 MG tablet Take 650 mg by mouth every 4 (four) hours as needed for mild pain or fever.   Yes [provider]  atropine 1 % ophthalmic solution Place 4 drops under the tongue every 4 (four) hours as needed (secretions).   Yes [provider]  diclofenac Sodium (VOLTAREN) 1 % GEL Apply 2 g topically 4 (four) times daily. Patient taking differently: Apply 4 g topically See admin instructions. Apply 4 grams to the legs four times a day 03/06/22  Yes Ghimire, Henreitta Leber, MD  haloperidol (HALDOL) 1 MG tablet Take 1 mg by mouth in the morning and at bedtime.   Yes [provider]  morphine (ROXANOL) 20 MG/ML concentrated solution Take 5 mg by mouth every 4 (four) hours as needed (for pain or shortness of breath).   Yes [provider]  polyethylene glycol powder (GLYCOLAX/MIRALAX) 17 GM/SCOOP powder Place 17 g into feeding tube daily. Patient taking differently: Take 17 g by mouth daily. 03/06/22  Yes Ghimire, Henreitta Leber, MD  risperiDONE (RISPERDAL) 0.5 MG tablet Take 0.5 mg by mouth at bedtime.   Yes [provider]  valproic acid (DEPAKENE) 250 MG/5ML solution Take 250 mg by mouth at bedtime.   Yes [provider]  Water For Irrigation, Sterile (FREE WATER) SOLN Place 200 mLs into feeding tube every 4 (four) hours. 03/06/22 06/04/22 Yes Ghimire, Henreitta Leber, MD  acetaminophen (TYLENOL) 160 MG/5ML suspension Place 10.2 mLs (325 mg total) into feeding tube every 6 (six) hours as needed for mild pain, headache or fever. Patient not taking: Reported on 04/26/2022 03/06/22   Jonetta Osgood, MD  amoxicillin-clavulanate (AUGMENTIN) 875-125 MG tablet Take 1 tablet by mouth 2 (two) times daily. Patient not taking: Reported on 04/26/2022 04/08/22   Edwin Dada, MD  LORazepam (ATIVAN) 0.5 MG tablet Place 1 tablet (0.5 mg total) into feeding tube daily. Patient not taking: Reported on  04/26/2022 03/16/22   Darliss Cheney, MD  Nutritional Supplements (FEEDING SUPPLEMENT, JEVITY 1.5 CAL/FIBER,) LIQD Place 1,000 mLs into feeding tube continuous. At 55 ml/hr Patient not taking: Reported on 04/26/2022 03/06/22   Jonetta Osgood, MD  Protein (FEEDING SUPPLEMENT, PROSOURCE TF20,) liquid Place 60 mLs into feeding tube daily. Patient not taking: Reported on 04/26/2022 03/06/22 06/04/22  Jonetta Osgood, MD    Physical Exam: Vitals:   04/26/22 1649 04/26/22 1700 04/26/22 1715 04/26/22 1721  BP: (!) 106/55 (!) 81/65 90/67   Pulse: (!) 109     Resp: 19 (!) 23 18 16   Temp:      TempSrc:      SpO2: 100%       Constitutional: NAD, calm, comfortable.  Dry mucous membranes.  On nonrebreather. Vitals:   04/26/22 1649 04/26/22 1700 04/26/22 1715 04/26/22 1721  BP: (!) 106/55 (!) 81/65 90/67   Pulse: (!) 109     Resp: 19 (!) 23 18 16   Temp:      TempSrc:      SpO2: 100%      Eyes: PERRL, lids and conjunctivae normal ENMT: Mucous membranes are dry. Posterior pharynx clear of any exudate or lesions. Poor  dentition.  Neck: normal, supple, no masses, no thyromegaly Respiratory: Some scattered coarse breath sounds anterior lung fields.  No wheezing.  No crackles.  Fair air movement.  On nonrebreather at 10 L with sats of 100%.  No significant use of accessory muscles of respiration. Cardiovascular: Regular rate and rhythm, no murmurs / rubs / gallops. No extremity edema. 2+ pedal pulses. No carotid bruits.  Abdomen: no tenderness, no masses palpated. No hepatosplenomegaly. Bowel sounds positive.  Musculoskeletal: no clubbing / cyanosis. No joint deformity upper and lower extremities. Good ROM, no contractures. Normal muscle tone.  Skin: no rashes, lesions, ulcers. No induration Neurologic: CN 2-12 grossly intact.  Moving extremities spontaneously.  Unable to assess sensation or strength.  Psychiatric: Unable to assess judgment or insight.  Alert.   Labs on Admission: I have  personally reviewed following labs and imaging studies  CBC: Recent Labs  Lab 04/26/22 1537  WBC 6.3  NEUTROABS 4.4  HGB 9.4*  HCT 29.4*  MCV 92.5  PLT 147*    Basic Metabolic Panel: Recent Labs  Lab 04/26/22 1537  NA 140  K 4.5  CL 102  CO2 26  GLUCOSE 78  BUN 55*  CREATININE 3.78*  CALCIUM 9.6    GFR: CrCl cannot be calculated (Unknown ideal weight.).  Liver Function Tests: Recent Labs  Lab 04/26/22 1537  AST 50*  ALT 18  ALKPHOS 58  BILITOT 1.4*  PROT 7.0  ALBUMIN 2.5*    Urine analysis:    Component Value Date/Time   COLORURINE YELLOW 03/01/2022 1336   APPEARANCEUR CLEAR 03/01/2022 1336   LABSPEC 1.016 03/01/2022 1336   PHURINE 6.0 03/01/2022 1336   GLUCOSEU NEGATIVE 03/01/2022 1336   HGBUR NEGATIVE 03/01/2022 1336   [place] 03/01/2022 1336   KETONESUR NEGATIVE 03/01/2022 1336   PROTEINUR NEGATIVE 03/01/2022 1336   NITRITE NEGATIVE 03/01/2022 1336   LEUKOCYTESUR NEGATIVE 03/01/2022 1336    Radiological Exams on Admission: DG Chest Port 1 View  Result Date: 04/26/2022 CLINICAL DATA:  Possible sepsis. EXAM: PORTABLE CHEST 1 VIEW COMPARISON:  04/07/2022 FINDINGS: Persistent right base collapse/consolidation with new airspace disease noted in the left base. Probable layering small bilateral pleural effusions. The cardio pericardial silhouette is enlarged. Telemetry leads overlie the chest. Status post left shoulder replacement. IMPRESSION: Persistent right base collapse/consolidation with new airspace disease in the left base. Probable layering bilateral pleural effusions. Electronically Signed   By: Misty Stanley M.D.   On: 04/26/2022 16:19    EKG: Independently reviewed.  With A-fib, with rate of 122.  Assessment/Plan Principal Problem:   Sepsis ([place]) Active Problems:   Dementia (HCC)   Malnutrition of moderate degree   Acute metabolic encephalopathy   Dysphagia   Chronic kidney disease, stage 3a (HCC)   Anemia of chronic  disease   Debility   Acute kidney injury superimposed on chronic kidney disease (HCC)   Aspiration pneumonia (HCC)   CAP (community acquired pneumonia)   #1 sepsis sepsis, likely secondary to pneumonia as/aspiration pneumonia, POA -Patient presenting with criteria meeting sepsis with tachycardia, hypoxia, tachypnea, hypotension, chest x-ray concerning for left basilar pneumonia.  Lactic acid elevated at 2.2.  Noted to be in acute renal failure. -Patient with history of dementia, recent decline with recurrent aspirations, just hospitalized 03/2022 for aspiration pneumonia. -SARS coronavirus 2 PCR done negative, influenza A and B negative, blood cultures pending. -Check urine Legionella antigen, urine pneumococcus antigen, UA with cultures and sensitivities, repeat lactic acid level. -Check a sputum Gram stain and  culture. -SLP evaluation. -Place empirically on IV vancomycin, IV Rocephin, IV azithromycin. -Placed on nebs as needed, Mucinex, PPI. -Continue IV fluids, supportive care.  2.  Acute renal failure on CKD stage IIIa -Likely secondary to a prerenal azotemia as patient noted to have presented with hypotension with systolic blood pressures in the 60s. -Creatinine on presentation at 3.78, was 1.20 on day of discharge during last hospitalization 04/08/2022. -Check a UA with cultures and sensitivities, check a urine sodium, urine creatinine. -Check a renal ultrasound. -Place Foley catheter. -Continue IV fluids. -Monitor urine output, supportive care.  3.  History of dementia with current aspiration in the setting of terminal dysphagia -Patient currently being followed under hospice care in the outpatient setting. -Patient with recurrent aspiration events, has been declining over several months, recently hospitalized for aspiration pneumonia. -Currently under hospice care in the outpatient setting, per hospice note patient currently a full code and has a DSS agent. -Consult with  palliative care to reassess goals of care, due to recurrent hospitalizations and decline. -Resume home regimen risperdal and Depakote. -IV Haldol as needed.  4.  Acute metabolic encephalopathy -Per EDP no patient noted to have a baseline by a rose himself around in the wheelchair and interacts verbally. -On assessment patient currently nonverbal, not following commands. -Likely secondary to problem #1. -Panculture. -Empiric IV antibiotics, IV fluids, supportive care.  5.  Status post percutaneous gastrostomy tube -Patient noted to have a PEG tube placed approximately 2 years ago however currently not using unknown modified oral diet being adjusted by speech therapist. -Patient currently under hospice care.  6.  History of junctional bradycardia -Patient noted to have been evaluated by cardiology recently and deemed not a candidate for pacing as it was felt this to hasten his decline, physically and mentally.  7.  Anemia of chronic disease -Patient with no overt bleeding. -Monitor H&H with IV fluids. -Transfusion threshold hemoglobin < 7.  DVT prophylaxis: Lovenox Code Status:   Full per hospice note. Family Communication:  No family at bedside.  Tried to call DSS agent, Yaakov Guthrie however went to voicemail. Disposition Plan:   Patient is from:  [employer]  Anticipated DC to:  SNF and hospice: [facility].  Anticipated DC date:  TBD  Anticipated DC barriers: Clinical improvement Consults called:  Palliative care Admission status:  Admit to stepdown unit  Severity of Illness: The appropriate patient status for this patient is INPATIENT. Inpatient status is judged to be reasonable and necessary in order to provide the required intensity of service to ensure the patient's safety. The patient's presenting symptoms, physical exam findings, and initial radiographic and laboratory data in the context of their chronic comorbidities is felt to place them at high risk for  further clinical deterioration. Furthermore, it is not anticipated that the patient will be medically stable for discharge from the hospital within 2 midnights of admission.   * I certify that at the point of admission it is my clinical judgment that the patient will require inpatient hospital care spanning beyond 2 midnights from the point of admission due to high intensity of service, high risk for further deterioration and high frequency of surveillance required.*    Irine Seal MD [facility]  How to contact the [facility] Attending or Consulting provider [place] or covering provider during after hours [place], for this patient?   Check the care team in [facility] and look for a) attending/consulting [facility] provider listed and b) the [facility] team  listed Log into [email or web address] and use [place] universal password to access. If you do not have the password, please contact the hospital operator. Locate the [facility] provider you are looking for under [facility] and page to a number that you can be directly reached. If you still have difficulty reaching the provider, please page the [facility] (Director on Call) for the Hospitalists listed on amion for assistance.  04/26/2022, 6:39 PM

## 2022-04-26 NOTE — Sepsis Progress Note (Signed)
eLink is following this Code Sepsis. °

## 2022-04-26 NOTE — Progress Notes (Signed)
A consult was received from an ED physician for vancomycin and cefepime per pharmacy dosing (for an indication other than meningitis). The patient's profile has been reviewed for ht/wt/allergies/indication/available labs. A one time order has been placed for the above antibiotics.  Further antibiotics/pharmacy consults should be ordered by admitting physician if indicated.                       Reuel Boom, PharmD, BCPS [phone] 04/26/2022, 3:41 PM

## 2022-04-26 NOTE — Progress Notes (Signed)
Pharmacy Antibiotic Note  Jack Rivas is a 76 y.o. male admitted on 04/26/2022 with sepsis, likely secondary to pneumonia/aspiration pna.  Pharmacy has been consulted for vancomycin dosing.  In ED, vancomycin 1 g IV x 1 administered on 10/11 at 1639  Plan: Continue vancomycin at 1 g IV every 36 hours (Goal AUC 400-550, eAUC 495.8, Scr used 3.78) Monitor clinical progress, renal function closely for reevaluation of dose & eAUC, vancomycin levels as indicated F/U MRSA PCR, C&S, abx deescalation / LOT      Temp (24hrs), Avg:101.6 F (38.7 C), Min:101.6 F (38.7 C), Max:101.6 F (38.7 C)  Recent Labs  Lab 04/26/22 1537  WBC 6.3  CREATININE 3.78*  LATICACIDVEN 2.2*    CrCl cannot be calculated (Unknown ideal weight.).    No Known Allergies  Antimicrobials this admission: 10/11 cefepime x 1 10/11 azithromycin >> (10/15) 10/11 ceftriaxone >> (10/15) 10/11 vancomycin >>   Microbiology results: 10/11 BCx: sent 10/11 UCx: collected 10/11 MRSA PCR: ordered  Thank you for allowing pharmacy to be a part of this patient's care.  Royetta Asal, PharmD, BCPS Clinical Pharmacist [place] Please utilize Amion for appropriate phone number to reach the unit pharmacist ([place]) 04/26/2022 6:57 PM

## 2022-04-26 NOTE — ED Triage Notes (Signed)
Pt from [facility] via EMS with c/o AMS. Per roommate at least 3 days, nursing staff at facility was not able to give EMS an answer. 60 palpated BP with EMS. 81% spO2 on 4L [place] on EMS arrival. Per EMS rales and rhonchi heard in bilateral lower lobes.    Hospice patient. Full code.

## 2022-04-27 ENCOUNTER — Other Ambulatory Visit (HOSPITAL_COMMUNITY): Payer: Medicare Other

## 2022-04-27 DIAGNOSIS — N179 Acute kidney failure, unspecified: Secondary | ICD-10-CM

## 2022-04-27 DIAGNOSIS — G9341 Metabolic encephalopathy: Secondary | ICD-10-CM | POA: Diagnosis not present

## 2022-04-27 DIAGNOSIS — E440 Moderate protein-calorie malnutrition: Secondary | ICD-10-CM

## 2022-04-27 DIAGNOSIS — A419 Sepsis, unspecified organism: Principal | ICD-10-CM

## 2022-04-27 DIAGNOSIS — I4892 Unspecified atrial flutter: Secondary | ICD-10-CM

## 2022-04-27 DIAGNOSIS — R6520 Severe sepsis without septic shock: Secondary | ICD-10-CM

## 2022-04-27 DIAGNOSIS — D638 Anemia in other chronic diseases classified elsewhere: Secondary | ICD-10-CM | POA: Diagnosis not present

## 2022-04-27 LAB — CBC WITH DIFFERENTIAL/PLATELET
Abs Immature Granulocytes: 0.01 10*3/uL (ref 0.00–0.07)
Basophils Absolute: 0 10*3/uL (ref 0.0–0.1)
Basophils Relative: 0 %
Eosinophils Absolute: 0 10*3/uL (ref 0.0–0.5)
Eosinophils Relative: 0 %
HCT: 25.3 % — ABNORMAL LOW (ref 39.0–52.0)
Hemoglobin: 7.8 g/dL — ABNORMAL LOW (ref 13.0–17.0)
Immature Granulocytes: 0 %
Lymphocytes Relative: 20 %
Lymphs Abs: 1 10*3/uL (ref 0.7–4.0)
MCH: 28.6 pg (ref 26.0–34.0)
MCHC: 30.8 g/dL (ref 30.0–36.0)
MCV: 92.7 fL (ref 80.0–100.0)
Monocytes Absolute: 0.4 10*3/uL (ref 0.1–1.0)
Monocytes Relative: 7 %
Neutro Abs: 3.4 10*3/uL (ref 1.7–7.7)
Neutrophils Relative %: 73 %
Platelets: 150 10*3/uL (ref 150–400)
RBC: 2.73 MIL/uL — ABNORMAL LOW (ref 4.22–5.81)
RDW: 14.2 % (ref 11.5–15.5)
WBC: 4.8 10*3/uL (ref 4.0–10.5)
nRBC: 0 % (ref 0.0–0.2)

## 2022-04-27 LAB — FERRITIN: Ferritin: 1069 ng/mL — ABNORMAL HIGH (ref 24–336)

## 2022-04-27 LAB — RETICULOCYTES
Immature Retic Fract: 15.2 % (ref 2.3–15.9)
RBC.: 2.59 MIL/uL — ABNORMAL LOW (ref 4.22–5.81)
Retic Count, Absolute: 31.1 10*3/uL (ref 19.0–186.0)
Retic Ct Pct: 1.2 % (ref 0.4–3.1)

## 2022-04-27 LAB — VITAMIN B12: Vitamin B-12: 1776 pg/mL — ABNORMAL HIGH (ref 180–914)

## 2022-04-27 LAB — COMPREHENSIVE METABOLIC PANEL
ALT: 18 U/L (ref 0–44)
AST: 44 U/L — ABNORMAL HIGH (ref 15–41)
Albumin: 2.1 g/dL — ABNORMAL LOW (ref 3.5–5.0)
Alkaline Phosphatase: 50 U/L (ref 38–126)
Anion gap: 8 (ref 5–15)
BUN: 42 mg/dL — ABNORMAL HIGH (ref 8–23)
CO2: 26 mmol/L (ref 22–32)
Calcium: 8.8 mg/dL — ABNORMAL LOW (ref 8.9–10.3)
Chloride: 107 mmol/L (ref 98–111)
Creatinine, Ser: 2.68 mg/dL — ABNORMAL HIGH (ref 0.61–1.24)
GFR, Estimated: 24 mL/min — ABNORMAL LOW (ref >60)
Glucose, Bld: 102 mg/dL — ABNORMAL HIGH (ref 70–99)
Potassium: 3.7 mmol/L (ref 3.5–5.1)
Sodium: 141 mmol/L (ref 135–145)
Total Bilirubin: 0.6 mg/dL (ref 0.3–1.2)
Total Protein: 5.9 g/dL — ABNORMAL LOW (ref 6.5–8.1)

## 2022-04-27 LAB — LEGIONELLA PNEUMOPHILA SEROGP 1 UR AG: L. pneumophila Serogp 1 Ur Ag: NEGATIVE

## 2022-04-27 LAB — GLUCOSE, CAPILLARY: Glucose-Capillary: 80 mg/dL (ref 70–99)

## 2022-04-27 LAB — IRON AND TIBC
Iron: 18 ug/dL — ABNORMAL LOW (ref 45–182)
Saturation Ratios: 12 % — ABNORMAL LOW (ref 17.9–39.5)
TIBC: 156 ug/dL — ABNORMAL LOW (ref 250–450)
UIBC: 138 ug/dL

## 2022-04-27 LAB — MAGNESIUM: Magnesium: 2.3 mg/dL (ref 1.7–2.4)

## 2022-04-27 LAB — FOLATE: Folate: 8.6 ng/mL (ref >5.9)

## 2022-04-27 LAB — PHOSPHORUS: Phosphorus: 4.9 mg/dL — ABNORMAL HIGH (ref 2.5–4.6)

## 2022-04-27 MED ORDER — SODIUM CHLORIDE 0.9 % IV BOLUS
500.0000 mL | Freq: Once | INTRAVENOUS | Status: AC
  Administered 2022-04-27: 500 mL via INTRAVENOUS

## 2022-04-27 MED ORDER — PROSOURCE TF20 ENFIT COMPATIBL EN LIQD
60.0000 mL | Freq: Every day | ENTERAL | Status: DC
  Administered 2022-04-27 – 2022-04-28 (×2): 60 mL
  Filled 2022-04-27 (×2): qty 60

## 2022-04-27 MED ORDER — LACTATED RINGERS IV SOLN: INTRAVENOUS | Status: DC

## 2022-04-27 MED ORDER — POLYETHYLENE GLYCOL 3350 17 G PO PACK: 17.0000 g | PACK | Freq: Every day | ORAL | Status: DC | PRN

## 2022-04-27 MED ORDER — GUAIFENESIN 100 MG/5ML PO LIQD
15.0000 mL | Freq: Four times a day (QID) | ORAL | Status: DC
  Administered 2022-04-27 – 2022-04-28 (×5): 15 mL
  Filled 2022-04-27 (×5): qty 20

## 2022-04-27 MED ORDER — ONDANSETRON HCL 4 MG PO TABS: 4.0000 mg | ORAL_TABLET | Freq: Four times a day (QID) | ORAL | Status: DC | PRN

## 2022-04-27 MED ORDER — MUPIROCIN 2 % EX OINT
1.0000 | TOPICAL_OINTMENT | Freq: Two times a day (BID) | CUTANEOUS | Status: DC
  Administered 2022-04-27 – 2022-04-28 (×3): 1 via NASAL
  Filled 2022-04-27: qty 22

## 2022-04-27 MED ORDER — ALBUMIN HUMAN 5 % IV SOLN
25.0000 g | Freq: Once | INTRAVENOUS | Status: AC
  Administered 2022-04-27: 25 g via INTRAVENOUS
  Filled 2022-04-27: qty 500

## 2022-04-27 MED ORDER — HALOPERIDOL LACTATE 5 MG/ML IJ SOLN
0.5000 mg | Freq: Four times a day (QID) | INTRAMUSCULAR | Status: DC | PRN
  Administered 2022-04-27 – 2022-04-28 (×2): 0.5 mg via INTRAVENOUS
  Filled 2022-04-27 (×2): qty 1

## 2022-04-27 MED ORDER — ONDANSETRON HCL 4 MG/2ML IJ SOLN: 4.0000 mg | Freq: Four times a day (QID) | INTRAMUSCULAR | Status: DC | PRN

## 2022-04-27 MED ORDER — MORPHINE SULFATE (CONCENTRATE) 10 MG/0.5ML PO SOLN: 5.0000 mg | ORAL | Status: DC | PRN

## 2022-04-27 NOTE — Progress Notes (Signed)
An USGPIV (ultrasound guided PIV) has been placed for short-term vasopressor infusion. A correctly placed ivWatch must be used when administering Vasopressors. Should this treatment be needed beyond 72 hours, central line access should be obtained.  It will be the responsibility of the bedside nurse to follow best practice to prevent extravasations.   ?

## 2022-04-27 NOTE — Progress Notes (Addendum)
PROGRESS NOTE    Jack Rivas  [id number] DOB: February 11, 1946 DOA: 04/26/2022 PCP: [name], MD    Chief Complaint  Patient presents with   Altered Mental Status    Brief Narrative: Patient 76 year old gentleman history of malnutrition with dysphagia, recurrent aspiration secondary to worsening dementia on hospice recently hospitalized 04/07/2022-04/08/2022 for aspiration pneumonia, failure to thrive.  Patient noted with history of PEG placement 2 years ago and PEG currently not being used for feeds and patient under modified diet under the care of speech therapist.  Patient presenting to the ED from SNF by EMS with altered mental status, hypotension, hypoxia, concern for sepsis felt secondary to probable recurrent aspiration pneumonia.  Patient pancultured, placed on IV fluids, IV antibiotics, supportive care.  Patient noted to be in atrial flutter on telemetry on 04/27/2022.   Assessment & Plan:   Principal Problem:   Sepsis ([place]) Active Problems:   Dementia ([place])   Malnutrition of moderate degree   Acute metabolic encephalopathy   Dysphagia   Chronic kidney disease, stage 3a (HCC)   Anemia of chronic disease   Debility   Acute kidney injury superimposed on chronic kidney disease (HCC)   Aspiration pneumonia (HCC)   CAP (community acquired pneumonia)   Atrial flutter ([place])  #1 sepsis sepsis, likely secondary to pneumonia as/aspiration pneumonia, POA -Patient presented with criteria meeting sepsis with tachycardia, hypoxia, tachypnea, hypotension, chest x-ray concerning for left basilar pneumonia.  Lactic acid elevated at 2.2 on admission and trending down currently at 1.7.   -Noted to be in acute renal failure. -Patient with history of dementia, recent decline with recurrent aspirations, just hospitalized 03/2022 for aspiration pneumonia. -SARS coronavirus 2 PCR done negative, influenza A and B negative, blood cultures pending with no growth to date. -Urinalysis nitrite  negative, leukocytes negative, 0-5 WBCs.  Urine cultures pending. -Urine strep pneumococcus antigen negative.  Urine Legionella antigen pending. -Sputum Gram stain and cultures pending. -MRSA PCR detected. -Patient with some clinical improvement this morning. -Patient noted overnight to be hypotensive which responded to a dose of IV albumin. -SLP evaluation pending. -Patient noted to be in atrial flutter and as such checking a 2D echo. -Continue empiric IV vancomycin, IV Rocephin, IV azithromycin, Mucinex, PPI, nebs as needed.   -Decrease IV fluid rate.   -If blood cultures remain negative after 3 days could discontinue IV vancomycin.   -Supportive care.     2.  Acute renal failure on CKD stage IIIa -Likely secondary to a prerenal azotemia as patient noted to have presented with hypotension with systolic blood pressures in the 60s. -Creatinine on presentation at 3.78, was 1.20 on day of discharge during last hospitalization 04/08/2022. -Urinalysis nitrite negative, leukocytes negative, negative for protein.   -Urine sodium 41, urine creatinine 33.   -Renal ultrasound negative for hydronephrosis.   -Foley catheter placed with a urine output of 2.650 L over the past 24 hours.   -Renal function trending down creatinine currently at 2.68 from 3.78 on admission.   -Decrease IV fluid rate to 125 cc an hour.   -Strict I's and O's, daily weights.    3.  History of dementia with current aspiration in the setting of terminal dysphagia -Patient currently being followed under hospice care in the outpatient setting. -Patient with recurrent aspiration events, has been declining over several months, recently hospitalized for aspiration pneumonia. -Currently under hospice care in the outpatient setting, per hospice note patient currently a full code and has a DSS agent. -Patient noted  to have had a PEG tube placed about 2 years ago in [place] and not currently being used.   -Due to recurrent  aspiration, will consult with dietitian to start tube feeds from PEG tube pending SLP evaluation.   -Palliative care consultation pending to reassess goals of care, due to recurrent hospitalizations and decline. -Continue home regimen risperdal and Depakote. -IV Haldol as needed.   4.  Acute metabolic encephalopathy -Per EDP no patient noted to have a baseline of rolling himself around in the wheelchair and interacts verbally. -On admission patient noted to be nonverbal, not following any commands.   -Some clinical improvement this morning, patient alert, some unintelligible speech however following some commands. -Likely secondary to problem #1. -Patient has been pancultured and results pending.  -Continue empiric IV antibiotics, IV fluids, supportive care.  5.  Atrial flutter -Noted on telemetry. -Currently rate controlled. -Likely secondary to problem #1. -Patient likely not a anticoagulation candidate due to history of dementia, recurrent aspiration pneumonias and declining status currently under the care of hospice. -Continue treatment as in problem #1.   6.  Status post percutaneous gastrostomy tube -Patient noted to have a PEG tube placed approximately 2 years ago in [place], however currently not using and patient with modified oral diet being adjusted by speech therapist. -Patient currently under hospice care. -Due to recurrent admissions for recurrent aspiration pneumonia and history of dysphagia with dementia. -Spoke with DSS agent, [name] who is guardianship and decision made for no artificial feeds, PEG tube will be used for medications only.  We will allow comfort feeds.   7.  History of junctional bradycardia -Patient noted to have been evaluated by cardiology recently and deemed not a candidate for pacing as it was felt this to hasten his decline, physically and mentally.   8.  Anemia of chronic disease -Patient with no overt bleeding. -Hemoglobin trending  down currently at 7.8 from 9.4 on admission. -Likely dilutional effect from IV fluids. -Check an anemia panel. --Monitor H&H with IV fluids. -Transfusion threshold hemoglobin < 7.     DVT prophylaxis: Lovenox Code Status: Full Family Communication: No family at bedside. Disposition: Likely back to SNF, memory care unit with hospice following when clinically improved.  Status is: Inpatient Remains inpatient appropriate because: Severity of illness   Consultants:  None  Procedures:  Renal ultrasound 04/26/2022 Chest x-ray 04/26/2022  Antimicrobials:  IV vancomycin 04/26/2022>>>> IV Rocephin 04/26/2022>>>> IV azithromycin 04/26/2022>>>>   Subjective: Patient alert this morning, on facemask at 4 L.  Alert, some unintelligible speech, following some commands.  Patient shakes his head no, when asked whether any chest pain or shortness of breath or abdominal pain.  Patient noted to be hypotensive overnight requiring IV albumin with improvement with blood pressure.  Patient noted atrial flutter on telemetry.  Objective: Vitals:   04/27/22 0700 04/27/22 0745 04/27/22 0800 04/27/22 0816  BP: 115/63  121/66   Pulse:   67 70  Resp: 17  17 16   Temp:  (!) 96.8 F (36 C)    TempSrc:  Axillary    SpO2: 100%  100% 100%  Weight:      Height:        Intake/Output Summary (Last 24 hours) at 04/27/2022 0919 Last data filed at 04/27/2022 0805 Gross per 24 hour  Intake 6812.13 ml  Output 2650 ml  Net 4162.13 ml   Filed Weights   04/26/22 2327 04/27/22 0500  Weight: 83.7 kg 85.1 kg    Examination:  General  exam: Alert. Respiratory system: Some coarse breath sounds anterior lung fields.  Normal respiratory effort.  No wheezing.  No crackles.  No use of accessory muscles of respiration. Cardiovascular system: S1 & S2 heard, RRR. No JVD, murmurs, rubs, gallops or clicks. No pedal edema. Gastrointestinal system: Abdomen is nondistended, soft and nontender. No organomegaly or masses  felt. Normal bowel sounds heard.  PEG tube intact. Central nervous system: Alert.  Moving extremities spontaneously.  No focal neurological deficits. Extremities: Symmetric 5 x 5 power. Skin: No rashes, lesions or ulcers Psychiatry: Judgement and insight unable to assess. Mood & affect appropriate.     Data Reviewed: I have personally reviewed following labs and imaging studies  CBC: Recent Labs  Lab 04/26/22 1537 04/27/22 0220  WBC 6.3 4.8  NEUTROABS 4.4 3.4  HGB 9.4* 7.8*  HCT 29.4* 25.3*  MCV 92.5 92.7  PLT 147* [id number]    Basic Metabolic Panel: Recent Labs  Lab 04/26/22 1537 04/27/22 0220  NA 140 141  K 4.5 3.7  CL 102 107  CO2 26 26  GLUCOSE 78 102*  BUN 55* 42*  CREATININE 3.78* 2.68*  CALCIUM 9.6 8.8*  MG 1.6* 2.3  PHOS  --  4.9*    GFR: Estimated Creatinine Clearance: 28.2 mL/min (A) (by C-G formula based on SCr of 2.68 mg/dL (H)).  Liver Function Tests: Recent Labs  Lab 04/26/22 1537 04/27/22 0220  AST 50* 44*  ALT 18 18  ALKPHOS 58 50  BILITOT 1.4* 0.6  PROT 7.0 5.9*  ALBUMIN 2.5* 2.1*    CBG: Recent Labs  Lab 04/26/22 1540  GLUCAP 66*     Recent Results (from the past 240 hour(s))  Resp Panel by RT-PCR (Flu A&B, Covid) Anterior Nasal Swab     Status: None   Collection Time: 04/26/22  4:47 PM   Specimen: Anterior Nasal Swab  Result Value Ref Range Status   SARS Coronavirus 2 by RT PCR NEGATIVE NEGATIVE Final    Comment: (NOTE) SARS-CoV-2 target nucleic acids are NOT DETECTED.  The SARS-CoV-2 RNA is generally detectable in upper respiratory specimens during the acute phase of infection. The lowest concentration of SARS-CoV-2 viral copies this assay can detect is 138 copies/mL. A negative result does not preclude SARS-Cov-2 infection and should not be used as the sole basis for treatment or other patient management decisions. A negative result may occur with  improper specimen collection/handling, submission of specimen other than  nasopharyngeal swab, presence of viral mutation(s) within the areas targeted by this assay, and inadequate number of viral copies(<138 copies/mL). A negative result must be combined with clinical observations, patient history, and epidemiological information. The expected result is Negative.  Fact Sheet for Patients:  EntrepreneurPulse.com.au  Fact Sheet for Healthcare Providers:  IncredibleEmployment.be  This test is no t yet approved or cleared by the [place] FDA and  has been authorized for detection and/or diagnosis of SARS-CoV-2 by FDA under an Emergency Use Authorization (EUA). This EUA will remain  in effect (meaning this test can be used) for the duration of the COVID-19 declaration under Section 564(b)(1) of the Act, 21 U.S.C.section 360bbb-3(b)(1), unless the authorization is terminated  or revoked sooner.       Influenza A by PCR NEGATIVE NEGATIVE Final   Influenza B by PCR NEGATIVE NEGATIVE Final    Comment: (NOTE) The Xpert Xpress SARS-CoV-2/FLU/RSV plus assay is intended as an aid in the diagnosis of influenza from Nasopharyngeal swab specimens and should not be used as a  sole basis for treatment. Nasal washings and aspirates are unacceptable for Xpert Xpress SARS-CoV-2/FLU/RSV testing.  Fact Sheet for Patients: EntrepreneurPulse.com.au  Fact Sheet for Healthcare Providers: IncredibleEmployment.be  This test is not yet approved or cleared by the [place] FDA and has been authorized for detection and/or diagnosis of SARS-CoV-2 by FDA under an Emergency Use Authorization (EUA). This EUA will remain in effect (meaning this test can be used) for the duration of the COVID-19 declaration under Section 564(b)(1) of the Act, 21 U.S.C. section 360bbb-3(b)(1), unless the authorization is terminated or revoked.  Performed at [facility], [place] [address] [place], [place] [address]   MRSA Next Gen by PCR, Nasal     Status: Abnormal   Collection Time: 04/26/22  9:26 PM   Specimen: Nasal Mucosa; Nasal Swab  Result Value Ref Range Status   MRSA by PCR Next Gen DETECTED (A) NOT DETECTED Final    Comment: (NOTE) The GeneXpert MRSA Assay (FDA approved for NASAL specimens only), is one component of a comprehensive MRSA colonization surveillance program. It is not intended to diagnose MRSA infection nor to guide or monitor treatment for MRSA infections. Test performance is not FDA approved in patients less than 7 years old. Performed at [facility], [place] [address] [place], [place] [address]          Radiology Studies: US RENAL  Result Date: 04/26/2022 CLINICAL DATA:  Acute renal failure EXAM: RENAL / URINARY TRACT ULTRASOUND COMPLETE COMPARISON:  CT 03/13/2022 FINDINGS: Right Kidney: Renal measurements: 10.7 x 3.8 x 4.9 cm = volume: 105 mL. Normal parenchymal echotexture and thickness. Simple appearing cyst measuring 1.5 cm in diameter. No imaging follow-up is indicated. No hydronephrosis or solid mass. Left Kidney: Renal measurements: 10.1 x 5 x 4.7 cm = volume: 126 mL. Echogenicity within normal limits. No mass or hydronephrosis visualized. Bladder: Bladder is decompressed with a Foley catheter. Other: None. IMPRESSION: Normal ultrasound appearance of the kidneys. No hydronephrosis. Bladder is decompressed with a Foley catheter. Electronically Signed   By: Lucienne Capers M.D.   On: 04/26/2022 19:05   DG Chest Port 1 View  Result Date: 04/26/2022 CLINICAL DATA:  Possible sepsis. EXAM: PORTABLE CHEST 1 VIEW COMPARISON:  04/07/2022 FINDINGS: Persistent right base collapse/consolidation with new airspace disease noted in the left base. Probable layering small bilateral pleural effusions. The cardio pericardial silhouette is enlarged. Telemetry leads overlie the chest. Status post left shoulder replacement. IMPRESSION:  Persistent right base collapse/consolidation with new airspace disease in the left base. Probable layering bilateral pleural effusions. Electronically Signed   By: Misty Stanley M.D.   On: 04/26/2022 16:19        Scheduled Meds:  Chlorhexidine Gluconate Cloth  6 each Topical Daily   enoxaparin (LOVENOX) injection  30 mg Subcutaneous Q24H   free water  200 mL Per Tube Q4H   guaiFENesin  15 mL Per Tube Q6H   mupirocin ointment  1 Application Nasal BID   nicotine  14 mg Transdermal Daily   mouth rinse  15 mL Mouth Rinse 4 times per day   risperiDONE  0.5 mg Per Tube QHS   sodium chloride flush  3 mL Intravenous Q12H   umeclidinium bromide  1 puff Inhalation Daily   valproic acid  250 mg Per Tube QHS   Continuous Infusions:  azithromycin Stopped (04/26/22 2237)   cefTRIAXone (ROCEPHIN)  IV Stopped (04/27/22 0003)   lactated ringers     [START ON 04/28/2022] vancomycin  LOS: 1 day    Time spent: 45 minutes    Irine Seal, MD [facility]   To contact the attending provider between 7A-7P or the covering provider during after hours 7P-7A, please log into the web site [email or web address] and access using universal [place] password for that web site. If you do not have the password, please call the hospital operator.  04/27/2022, 9:19 AM

## 2022-04-27 NOTE — Progress Notes (Addendum)
[facility] [address] [facility]) Hospital Note  Jack Rivas was brought in by [facility] for decreased level of consciousness and shortness of breath. 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  Patient is a Guardian of the state, and currently a FULL CODE.  Jack Rivas was admitted to [facility] on 10/11, for Sepsis likely secondary to aspiration pneumonia.    Per [facility] MD, Jack Rivas, this is a related hospital admission.    Visited Jack Rivas at the bedside, Alert, incomprehensible speech, good eye contact, simple mask O2 on and mouth breathing.  Jack Rivas does not appear to be in distress.  Patient is GIP appropriate as he is receiving IVF, IV anitibiotics, IV albumin.    VS: 97.1 axillary 121/66 HR 67 RR16 100% simple mask 4   Abnormal Labs: BUN 42 Cr 2.68 Albumin 2.1 AST 44 Total Protein 5.9 GFR 24 Hgb 7.8  Diagnostics:  CXR: Persistent right base collapse/ consolidation w/ new airspace disease noted in left base. Probable layering small bilateral pleural effusions.  Enlarge cardiopulmonary silhouette.   RENAL US: normal appearance of kidneys.    IV/PRN Medications:  Albumin 25g IVPB once Azithromycin 400 mg IVPB QD Cefepime 2 g IVPB once Ceftriaxone 2 g IVPB QD Lactated Ringers 3, 1L boluses; continuous infusion @15omL /h for 24 hours Magnesium Sulfate 4g IVPB once Sodium chloride 529mL X2 bolus Vancomycin 1g IVPB once  Problems List: - Sepsis secondary to pneumonia/ aspiration: IVF, IV abx, SLP evaluation, nebulizers - Acute renal failure: CKDIII: Foley placed, IVF - Dementia: dysphagia, SLP evaluation.   - Acute Metabolic Encephalopathy: patient is currently nonverbal, at baseline able to roll himself  around in wheelchair.  GOC: Jack Rivas Spoke with Jack Rivas [phone], DSS Guardian, about transitioning to comfort care, DNR. Paperwork for DNR and MOST form completed and sent to DSS. Plan to treat pneumonia, meds per tube, comfort feeds via dysphagia III diet, then to discharge back to [facility] on comfort care, with [facility] team following.    Medication list and transfer summary placed in patient chart.    Family contact:  Call made to daughter Jack Rivas, no answer and VM not set up.  Hospital staff and IDT updated on plan of care.    Jack Rivas BSN, [facility] Liaison [phone]

## 2022-04-27 NOTE — Progress Notes (Signed)
Patient's heart rhythm converted from Aflutter, to SB with frequent PACs and PVCs. BP stable. Dr Grandville Silos notified by RN and EKG obtained. This RN will continue to carefully monitor for changes in cardiac status.

## 2022-04-27 NOTE — Progress Notes (Signed)
Palliative-   Consult received. Noted patient is under care of [facility]. Spoke with [name], RN [facility] liaison. [facility] has discussed goals of care with patient's legal guardian and plan is to change code status to DNR, treat with IV antibiotics for one more day and then d/c back to facility under hospice care.   Palliative will sign off, please feel free to reconsult if needed.   Mariana Kaufman, AGNP-C Palliative Medicine  No charge

## 2022-04-27 NOTE — Progress Notes (Signed)
PT Cancellation Note  Patient Details Name: Jack Rivas MRN: [id number] DOB: Jul 22, 1945   Cancelled Treatment:    Reason Eval/Treat Not Completed: PT screened, no needs identified, will sign off, to Dc  back to SNF with Hospice. [place] Office [phone] Weekend [phone]    Claretha Cooper 04/27/2022, 12:39 PM

## 2022-04-27 NOTE — Progress Notes (Signed)
Nutrition Brief Note  Pt is a 76yo M with PMH of HTN, dementia, aspiration pneumonia w/ PEG in place, CKD 3, anemia and a flutter who presents with AMS due to infection. Currently under hospice care at [facility]. Pt allowed modified texture diet normally, has not been using the PEG tube. Spoke with MD about plan of care. Plan to treat the infection and allow pt comfort feeds prn. Dysphagia 3 diet ordered. No EN at this time. Please consult RD should POC change and EN recommendations needed.  Candise Bowens, MS, RD, LDN, CNSC See AMiON for contact information

## 2022-04-27 NOTE — TOC Initial Note (Signed)
Transition of Care [facility]) - Initial/Assessment Note    Patient Details  Name: Jack Rivas MRN: [id number] Date of Birth: Apr 27, 1946  Transition [facility]) CM/SW Contact:    Leeroy Cha, RN Phone Number: 04/27/2022, 7:24 AM  Clinical Narrative:                 Patient is from [facility].  Goal is to return with hospice care.  Expected Discharge Plan: Skilled Nursing Facility Barriers to Discharge: Continued Medical Work up   Patient Goals and CMS Choice Patient states their goals for this hospitalization and ongoing recovery are:: return patient back to [facility] with hospice care. CMS Medicare.gov Compare Post Acute Care list provided to:: Legal Guardian Choice offered to / list presented to : [facility] POA / Guardian  Expected Discharge Plan and Services Expected Discharge Plan: [place]   Discharge Planning Services: CM Consult   Living arrangements for the past 2 months: [place]                                      Prior Living Arrangements/Services Living arrangements for the past 2 months: [place] Lives with:: Facility Resident Patient language and need for interpreter reviewed:: Yes Do you feel safe going back to the place where you live?: Yes      Need for Family Participation in Patient Care: Yes (Comment) (legal guardian) Care giver support system in place?: Yes (comment) ([facility])   Criminal Activity/Legal Involvement Pertinent to Current Situation/Hospitalization: No - Comment as needed  Activities of Daily Living Home Assistive Devices/Equipment: Other (Comment) (pt unable to answer) ADL Screening (condition at time of admission) Patient's cognitive ability adequate to safely complete daily activities?: No Is the patient deaf or have difficulty hearing?: Yes (per previous hx) Does the patient have difficulty seeing, even when  wearing glasses/contacts?: No Does the patient have difficulty concentrating, remembering, or making decisions?: Yes Patient able to express need for assistance with ADLs?: No Does the patient have difficulty dressing or bathing?: Yes Independently performs ADLs?: No Communication: Independent Is this a change from baseline?: Pre-admission baseline Dressing (OT): Needs assistance Is this a change from baseline?: Pre-admission baseline Grooming: Needs assistance Is this a change from baseline?: Pre-admission baseline Feeding: Needs assistance Is this a change from baseline?: Pre-admission baseline Bathing: Needs assistance Is this a change from baseline?: Pre-admission baseline Toileting: Needs assistance Is this a change from baseline?: Pre-admission baseline In/Out Bed: Needs assistance Is this a change from baseline?: Pre-admission baseline Walks in Home: Needs assistance Is this a change from baseline?: Pre-admission baseline Does the patient have difficulty walking or climbing stairs?: Yes Weakness of Legs: Both Weakness of Arms/Hands: Both  Permission Sought/Granted                  Emotional Assessment Appearance:: Appears stated age Attitude/Demeanor/Rapport: Inconsistent Affect (typically observed): Calm Orientation: : Oriented to Self, Oriented to Place, Oriented to Situation Alcohol / Substance Use: Never Used Psych Involvement: No (comment)  Admission diagnosis:  Sepsis ([place]) [A41.9] Sepsis with acute renal failure, due to unspecified organism, unspecified acute renal failure type, unspecified whether septic shock present ([place]) [A41.9, R65.20, N17.9] Patient Active Problem List   Diagnosis Date Noted   Sepsis ([place]) 04/26/2022   CAP (community acquired pneumonia) 04/26/2022   Aspiration  pneumonia ([place]) 04/07/2022   Septic shock due to undetermined organism ([place]) 03/01/2022   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   Debility 10/25/2021   Hypothermia 10/25/2021    Acute kidney injury superimposed on chronic kidney disease ([place]) 10/25/2021   Anemia of chronic disease 10/23/2021   Altered mental status 05/10/2021   Chronic kidney disease, stage 3a ([place]) 04/20/2021   Malnutrition of moderate degree 03/54/6568   Acute metabolic encephalopathy 12/75/1700   Dementia ([place]) 03/20/2021   Dysphagia 03/20/2021   PCP:  [name], MD Pharmacy:   [place], [place] - [address] [place] [address] Phone: [phone] Fax: [phone]     Social Determinants of Health (SDOH) Interventions    Readmission Risk Interventions   Row Labels 03/28/2021    3:34 PM  Readmission Risk Prevention Plan   Section Header. No data exists in this row.   Transportation Screening   Complete  PCP or Specialist Appt within 3-5 Days   Complete  HRI or [place]   Complete  Social Work Consult for [place] Planning/Counseling   Complete  Palliative Care Screening   Not Applicable  Medication Review [employer] photographer)   Complete

## 2022-04-27 NOTE — Progress Notes (Signed)
       CROSS COVER NOTE  NAME: Jack Rivas MRN: [id number] DOB : [birth year]    Date of Service   04/27/2022   HPI/Events of Note   Notified by bedside RN [phone]) of persistent SBP~ 70-80's.    Patient is noted to have received a total of 3 L of LR in the ED for sepsis protocol. Despite fluid repletion, patient has remained hypotensive secondary to pneumonia and dehydration.  Patient is already on IV antibiotics and IVF 150 cc/ h.  Patient has completed an additional 1 L fluid (0215) due to hypotensive episodes. Thus far, U/O ~ 2.25 L and does appear to be responsive to fluids as his SBP ~80-90's. Albumin may also assist with hypotensive episodes and will be administered, previous Albumin 2.5.   Will follow up labs for renal function and electrolytes.   0600- updated by RN: BP stabilizing, MAP> 65.   Interventions/ Plan   Additional 1L NS bolus Albumin 25 g       Raenette Rover, DNP, [place]

## 2022-04-27 NOTE — Progress Notes (Signed)
OT Cancellation Note  Patient Details Name: Jack Rivas MRN: [id number] DOB: 1945-10-28   Cancelled Treatment:    Reason Eval/Treat Not Completed: OT screened, no needs identified, will sign off. Patient has been evaluated by OT three times in the last 6 months - each time patient predominantly close to total assist for functional tasks. Today patient presents on venti mask, does not respond to stimuli. Plan is for patient to return to facility on [place].  [name] 04/27/2022, 11:08 AM

## 2022-04-28 DIAGNOSIS — F0150 Vascular dementia without behavioral disturbance: Secondary | ICD-10-CM

## 2022-04-28 DIAGNOSIS — A419 Sepsis, unspecified organism: Secondary | ICD-10-CM | POA: Diagnosis not present

## 2022-04-28 DIAGNOSIS — D638 Anemia in other chronic diseases classified elsewhere: Secondary | ICD-10-CM

## 2022-04-28 DIAGNOSIS — G9341 Metabolic encephalopathy: Secondary | ICD-10-CM | POA: Diagnosis not present

## 2022-04-28 DIAGNOSIS — N189 Chronic kidney disease, unspecified: Secondary | ICD-10-CM

## 2022-04-28 DIAGNOSIS — J189 Pneumonia, unspecified organism: Secondary | ICD-10-CM

## 2022-04-28 DIAGNOSIS — N1831 Chronic kidney disease, stage 3a: Secondary | ICD-10-CM

## 2022-04-28 DIAGNOSIS — R1310 Dysphagia, unspecified: Secondary | ICD-10-CM

## 2022-04-28 DIAGNOSIS — J690 Pneumonitis due to inhalation of food and vomit: Secondary | ICD-10-CM

## 2022-04-28 DIAGNOSIS — I4892 Unspecified atrial flutter: Secondary | ICD-10-CM

## 2022-04-28 DIAGNOSIS — N179 Acute kidney failure, unspecified: Secondary | ICD-10-CM | POA: Diagnosis not present

## 2022-04-28 DIAGNOSIS — E440 Moderate protein-calorie malnutrition: Secondary | ICD-10-CM

## 2022-04-28 LAB — CBC WITH DIFFERENTIAL/PLATELET
Abs Immature Granulocytes: 0.01 10*3/uL (ref 0.00–0.07)
Basophils Absolute: 0 10*3/uL (ref 0.0–0.1)
Basophils Relative: 0 %
Eosinophils Absolute: 0.2 10*3/uL (ref 0.0–0.5)
Eosinophils Relative: 3 %
HCT: 23.8 % — ABNORMAL LOW (ref 39.0–52.0)
Hemoglobin: 7.5 g/dL — ABNORMAL LOW (ref 13.0–17.0)
Immature Granulocytes: 0 %
Lymphocytes Relative: 19 %
Lymphs Abs: 0.9 10*3/uL (ref 0.7–4.0)
MCH: 29.2 pg (ref 26.0–34.0)
MCHC: 31.5 g/dL (ref 30.0–36.0)
MCV: 92.6 fL (ref 80.0–100.0)
Monocytes Absolute: 0.3 10*3/uL (ref 0.1–1.0)
Monocytes Relative: 7 %
Neutro Abs: 3.4 10*3/uL (ref 1.7–7.7)
Neutrophils Relative %: 71 %
Platelets: 145 10*3/uL — ABNORMAL LOW (ref 150–400)
RBC: 2.57 MIL/uL — ABNORMAL LOW (ref 4.22–5.81)
RDW: 13.9 % (ref 11.5–15.5)
WBC: 4.9 10*3/uL (ref 4.0–10.5)
nRBC: 0 % (ref 0.0–0.2)

## 2022-04-28 LAB — COMPREHENSIVE METABOLIC PANEL
ALT: 17 U/L (ref 0–44)
AST: 35 U/L (ref 15–41)
Albumin: 2.2 g/dL — ABNORMAL LOW (ref 3.5–5.0)
Alkaline Phosphatase: 47 U/L (ref 38–126)
Anion gap: 9 (ref 5–15)
BUN: 37 mg/dL — ABNORMAL HIGH (ref 8–23)
CO2: 27 mmol/L (ref 22–32)
Calcium: 9.2 mg/dL (ref 8.9–10.3)
Chloride: 107 mmol/L (ref 98–111)
Creatinine, Ser: 2.05 mg/dL — ABNORMAL HIGH (ref 0.61–1.24)
GFR, Estimated: 33 mL/min — ABNORMAL LOW (ref >60)
Glucose, Bld: 72 mg/dL (ref 70–99)
Potassium: 3.8 mmol/L (ref 3.5–5.1)
Sodium: 143 mmol/L (ref 135–145)
Total Bilirubin: 0.7 mg/dL (ref 0.3–1.2)
Total Protein: 5.9 g/dL — ABNORMAL LOW (ref 6.5–8.1)

## 2022-04-28 LAB — URINE CULTURE: Culture: NO GROWTH

## 2022-04-28 LAB — GLUCOSE, CAPILLARY: Glucose-Capillary: 73 mg/dL (ref 70–99)

## 2022-04-28 LAB — MAGNESIUM: Magnesium: 2 mg/dL (ref 1.7–2.4)

## 2022-04-28 MED ORDER — ORAL CARE MOUTH RINSE: 15.0000 mL | Freq: Every day | OROMUCOSAL | 0 refills | Status: AC

## 2022-04-28 MED ORDER — ONDANSETRON HCL 4 MG PO TABS: 4.0000 mg | ORAL_TABLET | Freq: Four times a day (QID) | ORAL | 0 refills | Status: DC | PRN

## 2022-04-28 MED ORDER — UMECLIDINIUM BROMIDE 62.5 MCG/ACT IN AEPB: 1.0000 | INHALATION_SPRAY | Freq: Every day | RESPIRATORY_TRACT | 0 refills | Status: DC

## 2022-04-28 MED ORDER — AMOXICILLIN-POT CLAVULANATE 250-62.5 MG/5ML PO SUSR: 500.0000 mg | Freq: Two times a day (BID) | ORAL | 0 refills | Status: AC

## 2022-04-28 MED ORDER — NICOTINE 14 MG/24HR TD PT24: 14.0000 mg | MEDICATED_PATCH | Freq: Every day | TRANSDERMAL | 0 refills | Status: DC

## 2022-04-28 MED ORDER — RISPERIDONE 0.5 MG PO TABS: 0.5000 mg | ORAL_TABLET | Freq: Every day | ORAL | 0 refills | Status: AC

## 2022-04-28 MED ORDER — ENOXAPARIN SODIUM 40 MG/0.4ML IJ SOSY: 40.0000 mg | PREFILLED_SYRINGE | INTRAMUSCULAR | Status: DC

## 2022-04-28 MED ORDER — VALPROIC ACID 250 MG/5ML PO SOLN: 250.0000 mg | Freq: Every day | ORAL | 0 refills | Status: AC

## 2022-04-28 MED ORDER — HALOPERIDOL 1 MG PO TABS: 1.0000 mg | ORAL_TABLET | Freq: Two times a day (BID) | ORAL | 0 refills | Status: DC

## 2022-04-28 MED ORDER — LEVALBUTEROL HCL 0.63 MG/3ML IN NEBU: 0.6300 mg | INHALATION_SOLUTION | Freq: Four times a day (QID) | RESPIRATORY_TRACT | 12 refills | Status: DC | PRN

## 2022-04-28 MED ORDER — MORPHINE SULFATE (CONCENTRATE) 20 MG/ML PO SOLN: 5.0000 mg | ORAL | 0 refills | Status: DC | PRN

## 2022-04-28 MED ORDER — MUPIROCIN 2 % EX OINT: 1.0000 | TOPICAL_OINTMENT | Freq: Two times a day (BID) | CUTANEOUS | 0 refills | Status: AC

## 2022-04-28 MED ORDER — GUAIFENESIN 100 MG/5ML PO LIQD: 15.0000 mL | Freq: Four times a day (QID) | ORAL | 0 refills | Status: AC

## 2022-04-28 NOTE — Progress Notes (Signed)
Faxed MAR to [facility] fax#[phone] w/confirmation.

## 2022-04-28 NOTE — Plan of Care (Signed)
Patient in NAD, vital signs stable, unable to give effective education as patient is A&Ox0-1 at baseline, report called to receiving facility [facility] to [place] RN

## 2022-04-28 NOTE — Social Work (Signed)
CSW spoke to the [place] she is aware that the Pt will be DC back to [facility]. CSW also spoke to the facility about the Pt returning back with hospice. CSW informed the Provider as well RNCM. PTAR was called RM number is [id number] Orlene Plum, the report Number is [phone] ask for [employer]. Nira Conn the guardian would like to be notified as well a copy of the DC summary.TOC will follow DC to make sure all is well.

## 2022-04-28 NOTE — Progress Notes (Signed)
PROGRESS NOTE    Jack Rivas  [id number] DOB: 10-Aug-1945 DOA: 04/26/2022 PCP: [name], MD    Chief Complaint  Patient presents with   Altered Mental Status    Brief Narrative: Patient 76 year old gentleman history of malnutrition with dysphagia, recurrent aspiration secondary to worsening dementia on hospice recently hospitalized 04/07/2022-04/08/2022 for aspiration pneumonia, failure to thrive.  Patient noted with history of PEG placement 2 years ago and PEG currently not being used for feeds and patient under modified diet under the care of speech therapist.  Patient presenting to the ED from SNF by EMS with altered mental status, hypotension, hypoxia, concern for sepsis felt secondary to probable recurrent aspiration pneumonia.  Patient pancultured, placed on IV fluids, IV antibiotics, supportive care.  Patient noted to be in atrial flutter on telemetry on 04/27/2022.   Assessment & Plan:   Principal Problem:   Sepsis ([place]) Active Problems:   Dementia ([place])   Malnutrition of moderate degree   Acute metabolic encephalopathy   Dysphagia   Chronic kidney disease, stage 3a (HCC)   Anemia of chronic disease   Debility   Acute kidney injury superimposed on chronic kidney disease (HCC)   Aspiration pneumonia (HCC)   CAP (community acquired pneumonia)   Atrial flutter ([place])  #1 sepsis sepsis, likely secondary to pneumonia as/aspiration pneumonia, POA -Patient presented with criteria meeting sepsis with tachycardia, hypoxia, tachypnea, hypotension, chest x-ray concerning for left basilar pneumonia.  Lactic acid elevated at 2.2 on admission and trending down currently at 1.7.   -Noted to be in acute renal failure. -Patient with history of dementia, recent decline with recurrent aspirations, just hospitalized 03/2022 for aspiration pneumonia. -SARS coronavirus 2 PCR done negative, influenza A and B negative, blood cultures pending with no growth to date. -Urinalysis nitrite  negative, leukocytes negative, 0-5 WBCs.  Urine cultures negative. -Urine strep pneumococcus antigen negative.  Urine Legionella antigen negative. -Sputum Gram stain and cultures pending. -MRSA PCR detected. -Patient with some clinical improvement this morning. -Patient noted to be hypotensive the evening of 04/26/2022 which responded to IV albumin. -BP stable. -Patient's DSS agent's, hospice are recommending no artificial feeds at this time with comfort feeds. -Patient noted to be in atrial flutter and subsequently back in normal sinus rhythm.  -Continue empiric IV vancomycin, IV Rocephin, IV azithromycin, Mucinex, PPI, nebs as needed.   -Decrease IV fluid rate.   -If blood cultures remain negative after 3 days could discontinue IV vancomycin.   -Supportive care.     2.  Acute renal failure on CKD stage IIIa -Likely secondary to a prerenal azotemia as patient noted to have presented with hypotension with systolic blood pressures in the 60s. -Creatinine on presentation at 3.78, was 1.20 on day of discharge during last hospitalization 04/08/2022. -Urinalysis nitrite negative, leukocytes negative, negative for protein.   -Urine sodium 41, urine creatinine 33.   -Renal ultrasound negative for hydronephrosis.   -Foley catheter placed with a urine output of 2.740 L over the past 24 hours.   -Renal function trending down creatinine currently at 2.05 from 2.68 from 3.78 on admission.   -Continue IV fluids at 125 cc an hour.  -Strict I's and O's, daily weights.    3.  History of dementia with current aspiration in the setting of terminal dysphagia -Patient currently being followed under hospice care in the outpatient setting. -Patient with recurrent aspiration events, has been declining over several months, recently hospitalized for aspiration pneumonia. -Currently under hospice care in the outpatient setting,  per hospice note patient currently a full code and has a DSS agent. -Patient noted to  have had a PEG tube placed about 2 years ago in [place] and not currently being used.   -Patient has been followed by hospice and case discussed with patient's DSS agent decision made for no artificial feeds, comfort feeds, likely discharge back to SNF with hospice following once medically stable. -Continue home regimen risperdal and Depakote. -IV Haldol as needed. -Hospice following.   4.  Acute metabolic encephalopathy -Per EDP no patient noted to have a baseline of rolling himself around in the wheelchair and interacts verbally. -On admission patient noted to be nonverbal, not following any commands.   -Improving clinically, patient alert, some unintelligible speech however following some commands. -Likely secondary to problem #1. -Patient has been pancultured and results pending.  -Urine cultures negative.  Blood cultures pending. -Continue empiric IV antibiotics, IV fluids, supportive care.  5.  Atrial flutter -Noted on telemetry. -Patient back in sinus rhythm with occasional PACs. -Currently rate controlled. -Likely secondary to problem #1. -Patient likely not a anticoagulation candidate due to history of dementia, recurrent aspiration pneumonias and declining status currently under the care of hospice. -Continue treatment as in problem #1.   6.  Status post percutaneous gastrostomy tube -Patient noted to have a PEG tube placed approximately 2 years ago in [place], however currently not using and patient with modified oral diet being adjusted by speech therapist. -Patient currently under hospice care. -Due to recurrent admissions for recurrent aspiration pneumonia and history of dysphagia with dementia. -Spoke with DSS agent, [name] who is guardianship and decision made for no artificial feeds, PEG tube will be used for medications only.  We will allow comfort feeds.   7.  History of junctional bradycardia -Patient noted to have been evaluated by cardiology recently  and deemed not a candidate for pacing as it was felt this to hasten his decline, physically and mentally.   8.  Anemia of chronic disease -Patient with no overt bleeding. -Hemoglobin was trending down, seems to be stabilizing currently at 7.5 from 7.8 from 9.4 on admission. -Likely dilutional effect from IV fluids. -Anemia panel consistent with anemia of chronic disease with iron of 18, TIBC of 156, ferritin of 1069, vitamin B12 of 1776. --Monitor H&H with IV fluids. -Transfusion threshold hemoglobin < 7.     DVT prophylaxis: Lovenox Code Status: Full Family Communication: No family at bedside. Disposition: Likely back to SNF, memory care unit with hospice following when clinically improved.  Status is: Inpatient Remains inpatient appropriate because: Severity of illness   Consultants:  None  Procedures:  Renal ultrasound 04/26/2022 Chest x-ray 04/26/2022  Antimicrobials:  IV vancomycin 04/26/2022>>>> IV Rocephin 04/26/2022>>>> IV azithromycin 04/26/2022>>>>   Subjective: Patient back in normal sinus rhythm with occasional PACs.  Currently on room air with sats of 95%.  Patient alert following some commands.  Some unintelligible speech.  Blood pressure stable.   Objective: Vitals:   04/28/22 0000 04/28/22 0400 04/28/22 0500 04/28/22 0725  BP: (!) 114/39 124/68    Pulse:      Resp: 20 (!) 21    Temp:    98.2 F (36.8 C)  TempSrc:    Oral  SpO2:      Weight:   86.7 kg   Height:        Intake/Output Summary (Last 24 hours) at 04/28/2022 0902 Last data filed at 04/28/2022 0413 Gross per 24 hour  Intake 3105.43 ml  Output  2740 ml  Net 365.43 ml    Filed Weights   04/26/22 2327 04/27/22 0500 04/28/22 0500  Weight: 83.7 kg 85.1 kg 86.7 kg    Examination:  General exam: Alert.  NAD. Respiratory system: Some coarse breath sounds anterior lung fields.  Normal respiratory effort.  No wheezing.  No crackles.  Currently on room air with sats of 95%.  No use of  accessory muscles of respiration.   Cardiovascular system: Regular rate rhythm no murmurs rubs or gallops.  No JVD.  No lower extremity edema.   Gastrointestinal system: Abdomen is nondistended, soft and nontender. No organomegaly or masses felt. Normal bowel sounds heard.  PEG tube intact. Central nervous system: Alert.  Moving extremities spontaneously.  No focal neurological deficits. Extremities: Symmetric 5 x 5 power. Skin: No rashes, lesions or ulcers Psychiatry: Judgement and insight unable to assess. Mood & affect appropriate.     Data Reviewed: I have personally reviewed following labs and imaging studies  CBC: Recent Labs  Lab 04/26/22 1537 04/27/22 0220 04/28/22 0254  WBC 6.3 4.8 4.9  NEUTROABS 4.4 3.4 3.4  HGB 9.4* 7.8* 7.5*  HCT 29.4* 25.3* 23.8*  MCV 92.5 92.7 92.6  PLT 147* 150 145*     Basic Metabolic Panel: Recent Labs  Lab 04/26/22 1537 04/27/22 0220 04/28/22 0254  NA 140 141 143  K 4.5 3.7 3.8  CL 102 107 107  CO2 26 26 27   GLUCOSE 78 102* 72  BUN 55* 42* 37*  CREATININE 3.78* 2.68* 2.05*  CALCIUM 9.6 8.8* 9.2  MG 1.6* 2.3 2.0  PHOS  --  4.9*  --      GFR: Estimated Creatinine Clearance: 37.6 mL/min (A) (by C-G formula based on SCr of 2.05 mg/dL (H)).  Liver Function Tests: Recent Labs  Lab 04/26/22 1537 04/27/22 0220 04/28/22 0254  AST 50* 44* 35  ALT 18 18 17   ALKPHOS 58 50 47  BILITOT 1.4* 0.6 0.7  PROT 7.0 5.9* 5.9*  ALBUMIN 2.5* 2.1* 2.2*     CBG: Recent Labs  Lab 04/26/22 1540 04/27/22 0831 04/28/22 0804  GLUCAP 66* 80 73      Recent Results (from the past 240 hour(s))  Blood Culture (routine x 2)     Status: None (Preliminary result)   Collection Time: 04/26/22  3:37 PM   Specimen: BLOOD  Result Value Ref Range Status   Specimen Description   Final    BLOOD BLOOD RIGHT HAND Performed at [facility], [place] [address] [place], [place] [address]    Special Requests   Final    BOTTLES DRAWN  AEROBIC AND ANAEROBIC Blood Culture adequate volume Performed at [place] [address] [place], [place] [address]    Culture   Final    NO GROWTH < 24 HOURS Performed at [place] [address], [place], [place] [address]    Report Status PENDING  Incomplete  Blood Culture (routine x 2)     Status: None (Preliminary result)   Collection Time: 04/26/22  4:12 PM   Specimen: BLOOD  Result Value Ref Range Status   Specimen Description   Final    BLOOD BLOOD LEFT FOREARM Performed at [place] [address], [place], [place] [address]    Special Requests   Final    BOTTLES DRAWN AEROBIC ONLY Blood Culture results may not be optimal due to an inadequate volume of blood received in culture bottles Performed  at [facility], [address], [place], [place] [address]    Culture   Final    NO GROWTH < 24 HOURS Performed at [facility], [address], [place], [place] [address]    Report Status PENDING  Incomplete  Resp Panel by RT-PCR (Flu A&B, Covid) Anterior Nasal Swab     Status: None   Collection Time: 04/26/22  4:47 PM   Specimen: Anterior Nasal Swab  Result Value Ref Range Status   SARS Coronavirus 2 by RT PCR NEGATIVE NEGATIVE Final    Comment: (NOTE) SARS-CoV-2 target nucleic acids are NOT DETECTED.  The SARS-CoV-2 RNA is generally detectable in upper respiratory specimens during the acute phase of infection. The lowest concentration of SARS-CoV-2 viral copies this assay can detect is 138 copies/mL. A negative result does not preclude SARS-Cov-2 infection and should not be used as the sole basis for treatment or other patient management decisions. A negative result may occur with  improper specimen collection/handling, submission of specimen other than nasopharyngeal swab, presence of viral mutation(s) within the areas targeted by this assay, and inadequate number of viral copies(<138  copies/mL). A negative result must be combined with clinical observations, patient history, and epidemiological information. The expected result is Negative.  Fact Sheet for Patients:  BloggerCourse.com  Fact Sheet for Healthcare Providers:  SeriousBroker.it  This test is no t yet approved or cleared by the [place] FDA and  has been authorized for detection and/or diagnosis of SARS-CoV-2 by FDA under an Emergency Use Authorization (EUA). This EUA will remain  in effect (meaning this test can be used) for the duration of the COVID-19 declaration under Section 564(b)(1) of the Act, 21 U.S.C.section 360bbb-3(b)(1), unless the authorization is terminated  or revoked sooner.       Influenza A by PCR NEGATIVE NEGATIVE Final   Influenza B by PCR NEGATIVE NEGATIVE Final    Comment: (NOTE) The Xpert Xpress SARS-CoV-2/FLU/RSV plus assay is intended as an aid in the diagnosis of influenza from Nasopharyngeal swab specimens and should not be used as a sole basis for treatment. Nasal washings and aspirates are unacceptable for Xpert Xpress SARS-CoV-2/FLU/RSV testing.  Fact Sheet for Patients: BloggerCourse.com  Fact Sheet for Healthcare Providers: SeriousBroker.it  This test is not yet approved or cleared by the [place] FDA and has been authorized for detection and/or diagnosis of SARS-CoV-2 by FDA under an Emergency Use Authorization (EUA). This EUA will remain in effect (meaning this test can be used) for the duration of the COVID-19 declaration under Section 564(b)(1) of the Act, 21 U.S.C. section 360bbb-3(b)(1), unless the authorization is terminated or revoked.  Performed at [facility], [address] [place], [place] [address]   Urine Culture     Status: None   Collection Time: 04/26/22  6:45 PM   Specimen: In/Out Cath Urine  Result Value Ref  Range Status   Specimen Description   Final    IN/OUT CATH URINE Performed at [facility], [address], [place], [place] [address]    Special Requests   Final    NONE Performed at [facility], [address] [place], [place] [address]    Culture   Final    NO GROWTH Performed at [facility], [address], [place], [place] [address]    Report Status 04/28/2022 FINAL  Final  MRSA Next Gen by PCR, Nasal     Status: Abnormal  Collection Time: 04/26/22  9:26 PM   Specimen: Nasal Mucosa; Nasal Swab  Result Value Ref Range Status   MRSA by PCR Next Gen DETECTED (A) NOT DETECTED Final    Comment: (NOTE) The GeneXpert MRSA Assay (FDA approved for NASAL specimens only), is one component of a comprehensive MRSA colonization surveillance program. It is not intended to diagnose MRSA infection nor to guide or monitor treatment for MRSA infections. Test performance is not FDA approved in patients less than 79 years old. Performed at [facility], [place] [address] [place], [place] [address]          Radiology Studies: US RENAL  Result Date: 04/26/2022 CLINICAL DATA:  Acute renal failure EXAM: RENAL / URINARY TRACT ULTRASOUND COMPLETE COMPARISON:  CT 03/13/2022 FINDINGS: Right Kidney: Renal measurements: 10.7 x 3.8 x 4.9 cm = volume: 105 mL. Normal parenchymal echotexture and thickness. Simple appearing cyst measuring 1.5 cm in diameter. No imaging follow-up is indicated. No hydronephrosis or solid mass. Left Kidney: Renal measurements: 10.1 x 5 x 4.7 cm = volume: 126 mL. Echogenicity within normal limits. No mass or hydronephrosis visualized. Bladder: Bladder is decompressed with a Foley catheter. Other: None. IMPRESSION: Normal ultrasound appearance of the kidneys. No hydronephrosis. Bladder is decompressed with a Foley catheter. Electronically Signed   By: Lucienne Capers M.D.   On: 04/26/2022 19:05   DG Chest Port 1  View  Result Date: 04/26/2022 CLINICAL DATA:  Possible sepsis. EXAM: PORTABLE CHEST 1 VIEW COMPARISON:  04/07/2022 FINDINGS: Persistent right base collapse/consolidation with new airspace disease noted in the left base. Probable layering small bilateral pleural effusions. The cardio pericardial silhouette is enlarged. Telemetry leads overlie the chest. Status post left shoulder replacement. IMPRESSION: Persistent right base collapse/consolidation with new airspace disease in the left base. Probable layering bilateral pleural effusions. Electronically Signed   By: Misty Stanley M.D.   On: 04/26/2022 16:19        Scheduled Meds:  Chlorhexidine Gluconate Cloth  6 each Topical Daily   enoxaparin (LOVENOX) injection  40 mg Subcutaneous Q24H   feeding supplement (PROSource TF20)  60 mL Per Tube Daily   free water  200 mL Per Tube Q4H   guaiFENesin  15 mL Per Tube Q6H   mupirocin ointment  1 Application Nasal BID   nicotine  14 mg Transdermal Daily   mouth rinse  15 mL Mouth Rinse 4 times per day   risperiDONE  0.5 mg Per Tube QHS   sodium chloride flush  3 mL Intravenous Q12H   umeclidinium bromide  1 puff Inhalation Daily   valproic acid  250 mg Per Tube QHS   Continuous Infusions:  azithromycin Stopped (04/27/22 2114)   cefTRIAXone (ROCEPHIN)  IV Stopped (04/27/22 2336)   lactated ringers 125 mL/hr at 04/28/22 0647   vancomycin Stopped (04/28/22 0413)     LOS: 2 days    Time spent: 45 minutes    Irine Seal, MD [facility]   To contact the attending provider between 7A-7P or the covering provider during after hours 7P-7A, please log into the web site [email or web address] and access using universal [place] password for that web site. If you do not have the password, please call the hospital operator.  04/28/2022, 9:02 AM

## 2022-04-28 NOTE — NC FL2 (Signed)
[place] LEVEL OF CARE SCREENING TOOL     IDENTIFICATION  Patient Name: Jack Rivas Birthdate: Dec 29, 1945 Sex: male Admission Date (Current Location): 04/26/2022  [name] and [place] Number:  Herbalist and Address:  [facility],  [place] [address], [place]      Provider Number:    Attending Physician Name and Address:  Eugenie Filler, MD  Relative Name and Phone Number:       Current Level of Care: Hospital Recommended Level of Care: Other (Comment) (LTC) Prior Approval Number:    Date Approved/Denied: 04/28/22 PASRR Number: [id number]  Discharge Plan: Other (Comment) (LTC)    Current Diagnoses: Patient Active Problem List   Diagnosis Date Noted   Atrial flutter ([place]) 04/27/2022   Sepsis (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   Debility 10/25/2021   Hypothermia 10/25/2021   Acute kidney injury superimposed on chronic kidney disease ([place]) 10/25/2021   Anemia of chronic disease 10/23/2021   Altered mental status 05/10/2021   Chronic kidney disease, stage 3a ([place]) 04/20/2021   Malnutrition of moderate degree 96/29/5284   Acute metabolic encephalopathy 13/24/4010   Dementia ([place]) 03/20/2021   Dysphagia 03/20/2021    Orientation RESPIRATION BLADDER Height & Weight      (LTC HOSpice)  O2, Vent Incontinent Weight: 191 lb 2.2 oz (86.7 kg) Height:  6\' 4"  (193 cm)  BEHAVIORAL SYMPTOMS/MOOD NEUROLOGICAL BOWEL NUTRITION STATUS      Incontinent    AMBULATORY STATUS COMMUNICATION OF NEEDS Skin   Total Care Does not communicate Normal                       Personal Care Assistance Level of Assistance  Bathing, Feeding, Dressing Bathing Assistance: Maximum assistance Feeding assistance: Maximum assistance Dressing Assistance: Maximum assistance      Functional Limitations Info  Sight, Hearing, Speech Sight Info: Impaired Hearing Info: Impaired Speech Info: Impaired    SPECIAL CARE FACTORS FREQUENCY                       Contractures Contractures Info: Not present    Additional Factors Info  Code Status Code Status Info: FULL Allergies Info: NA           Current Medications (04/28/2022):  This is the current hospital active medication list Current Facility-Administered Medications  Medication Dose Route Frequency Provider Last Rate Last Admin   acetaminophen (TYLENOL) tablet 650 mg  650 mg Oral Q6H PRN Eugenie Filler, MD       Or   acetaminophen (TYLENOL) suppository 650 mg  650 mg Rectal Q6H PRN Eugenie Filler, MD       azithromycin (ZITHROMAX) 500 mg in sodium chloride 0.9 % 250 mL IVPB  500 mg Intravenous Q24H Eugenie Filler, MD   Stopped at 04/27/22 2114   cefTRIAXone (ROCEPHIN) 2 g in sodium chloride 0.9 % 100 mL IVPB  2 g Intravenous Q24H Eugenie Filler, MD   Stopped at 04/27/22 2336   Chlorhexidine Gluconate Cloth 2 % PADS 6 each  6 each Topical Daily Eugenie Filler, MD   6 each at 04/27/22 0955   enoxaparin (LOVENOX) injection 40 mg  40 mg Subcutaneous Q24H Eugenie Filler, MD       feeding supplement (PROSource [id number]) liquid 60  mL  60 mL Per Tube Daily Eugenie Filler, MD   60 mL at 04/27/22 0956   free water 200 mL  200 mL Per Tube Q4H Eugenie Filler, MD   200 mL at 04/28/22 0901   guaiFENesin (ROBITUSSIN) 100 MG/5ML liquid 15 mL  15 mL Per Tube Q6H Green, Terri L, RPH   15 mL at 04/28/22 0313   haloperidol lactate (HALDOL) injection 0.5 mg  0.5 mg Intravenous Q6H PRN Eugenie Filler, MD   0.5 mg at 04/28/22 0141   hydrALAZINE (APRESOLINE) injection 10 mg  10 mg Intravenous Q6H PRN Eugenie Filler, MD       lactated ringers infusion   Intravenous Continuous Eugenie Filler, MD 125 mL/hr at 04/28/22 0647 New Bag at 04/28/22 0647   levalbuterol (XOPENEX) nebulizer  solution 0.63 mg  0.63 mg Nebulization Q6H PRN Eugenie Filler, MD       magnesium citrate solution 1 Bottle  1 Bottle Oral Once PRN Eugenie Filler, MD       morphine CONCENTRATE 10 MG/0.5ML oral solution 5 mg  5 mg Per Tube Q4H PRN Eugenie Filler, MD       mupirocin ointment (BACTROBAN) 2 % 1 Application  1 Application Nasal BID Eugenie Filler, MD   1 Application at [id number]   nicotine (NICODERM CQ - dosed in mg/24 hours) patch 14 mg  14 mg Transdermal Daily Eugenie Filler, MD   14 mg at 04/27/22 0956   ondansetron (ZOFRAN) tablet 4 mg  4 mg Per Tube Q6H PRN Eugenie Filler, MD       Or   ondansetron [facility]) injection 4 mg  4 mg Intravenous Q6H PRN Eugenie Filler, MD       Oral care mouth rinse  15 mL Mouth Rinse 4 times per day Eugenie Filler, MD   15 mL at 04/28/22 0900   Oral care mouth rinse  15 mL Mouth Rinse PRN Eugenie Filler, MD       polyethylene glycol (MIRALAX / GLYCOLAX) packet 17 g  17 g Per Tube Daily PRN Eugenie Filler, MD       risperiDONE (RISPERDAL) tablet 0.5 mg  0.5 mg Per Tube QHS Eugenie Filler, MD   0.5 mg at 04/27/22 2112   sodium chloride flush (NS) 0.9 % injection 3 mL  3 mL Intravenous Q12H Eugenie Filler, MD   3 mL at 04/27/22 2114   sorbitol 70 % solution 30 mL  30 mL Oral Daily PRN Eugenie Filler, MD       umeclidinium bromide (INCRUSE ELLIPTA) 62.5 MCG/ACT 1 puff  1 puff Inhalation Daily Eugenie Filler, MD       valproic acid (DEPAKENE) 250 MG/5ML solution 250 mg  250 mg Per Tube QHS Eugenie Filler, MD   250 mg at 04/27/22 2113   vancomycin (VANCOCIN) IVPB 1000 mg/200 mL premix  1,000 mg Intravenous Q36H Suzzanne Cloud, [facility]   Stopped at 04/28/22 0413     Discharge Medications: Please see discharge summary for a list of discharge medications.  Relevant Imaging Results:  Relevant Lab Results:   Additional Information [place] Finnegan Gatta [id number]     Pt SSI # [id number]  Salli Real [place],  [place]

## 2022-04-28 NOTE — Discharge Summary (Signed)
Physician Discharge Summary  Jack Rivas ZCH:[id number] DOB: 1945/10/02 DOA: 04/26/2022  PCP: [name], MD  Admit date: 04/26/2022 Discharge date: 04/28/2022  Time spent: 55 minutes  Recommendations for Outpatient Follow-up:  Follow-up with hospice MD.  Patient will need a basic metabolic profile done to follow-up on electrolytes and renal function in approximately a week, patient need a CBC done to follow-up on H&H.   Discharge Diagnoses:  Principal Problem:   Sepsis ([place]) Active Problems:   Dementia ([place])   Malnutrition of moderate degree   Acute metabolic encephalopathy   Dysphagia   Chronic kidney disease, stage 3a (HCC)   Anemia of chronic disease   Debility   Acute kidney injury superimposed on chronic kidney disease ([place])   Aspiration pneumonia ([place])   CAP (community acquired pneumonia)   Atrial flutter ([place])   Discharge Condition: Stable  Diet recommendation: Dysphagia 1 diet with nectar thick liquids/comfort feeds  Filed Weights   04/26/22 2327 04/27/22 0500 04/28/22 0500  Weight: 83.7 kg 85.1 kg 86.7 kg    History of present illness:  Jack Rivas is a 76 y.o. male with medical history significant of malnutrition with dysphagia, recurrent aspiration secondary to worsening dementia on hospice recently hospitalized 04/07/2022-04/08/2022 for aspiration pneumonia, failure to thrive.  Patient with prior history of PEG placement 2 years ago in the context of prolonged mechanical ventilation requiring trach and PEG, patient currently not using PEG tube for feeds and has been on modified diet under the care of speech therapy at times noted in the last few months to have accelerating aspiration pneumonias, worsening functional status and enrolled in hospice. -Patient with history of dementia, poor historian and sent to the ED from [facility].  Unable to obtain any history from patient as patient currently nonverbal but arousable. Per ED physician's note and chart review  patient presenting per EMS from [facility] facility, patient's roommate noted that over the past 3 days patient not doing too well, has become nonverbal.  It is noted at baseline that patient usually rolls himself around in the wheelchair and interacts verbally.  It is noted that patient has had recurrent decline over the past few months with recurrent aspiration pneumonia events, advanced dementia with behavioral disturbance and failure to thrive.  It is noted that patient has a baseline O2 requirements of 4 L of O2 and noted when EMS got a sats of 81%.  Patient placed on a nonrebreather and sats came up to 93%.  Patient also noted to have systolic blood pressures in the 60s and peripheral access unable to be obtained due to agitation.   ED Course: Patient seen in the ED, noted to have a temperature of 101.6, initially tachycardic with heart rates as high as 127, tachypneic with respiratory rate of 25, initial blood pressure on presentation was 68/55 with sats of 81%.  Patient placed on a nonrebreather with 15 L with sats currently at 100%.  Patient also placed on IV fluids with improvement with hypotension.  Comprehensive metabolic profile obtained with a BUN of 55, creatinine of 3.78, albumin of 2.5, AST of 50, bilirubin of 1.4 otherwise within normal limits.  Lactic acid noted elevated at 2.2. CBC with a hemoglobin of 9.4, platelet count of 147 otherwise was within normal limits.  Urinalysis pending.  Chest x-ray done with persistent right base collapse/consolidation with new airspace disease in the left base.  Probable layering bilateral pleural effusions.  Patient placed on aggressive IV fluid resuscitation.  Blood  cultures ordered.  Urinalysis ordered and pending.  Patient given IV vancomycin and IV cefepime in the ED.  Hospitalist were called to admit the patient for further evaluation and management.  Sepsis protocol initiated in the ED.    Hospital Course:  #1 sepsis sepsis, likely  secondary to pneumonia as/aspiration pneumonia, POA -Patient presented with criteria meeting sepsis with tachycardia, hypoxia, tachypnea, hypotension, chest x-ray concerning for left basilar pneumonia.  Lactic acid elevated at 2.2 on admission and trending down currently at 1.7.   -Noted to be in acute renal failure. -Patient with history of dementia, recent decline with recurrent aspirations, just hospitalized 03/2022 for aspiration pneumonia. -SARS coronavirus 2 PCR done negative, influenza A and B negative, blood cultures pending with no growth to date. -Urinalysis nitrite negative, leukocytes negative, 0-5 WBCs.  Urine cultures negative. -Urine strep pneumococcus antigen negative.  Urine Legionella antigen negative. -Sputum Gram stain and cultures pending. -MRSA PCR detected. -Patient with some clinical improvement during the hospitalization. -Patient noted to be hypotensive the evening of 04/26/2022 which responded to IV albumin. -BP stable. -Patient's DSS agent's, hospice are recommending no artificial feeds at this time with comfort feeds. -Patient noted to be in atrial flutter and subsequently back in normal sinus rhythm.  -Patient was placed empiric IV vancomycin, IV Rocephin, IV azithromycin, Mucinex, PPI, nebs as needed during the hospitalization. -Patient also hydrated with IV fluids. -Patient be discharged back to skilled nursing facility with hospice following with 5 more days of Augmentin per PEG tube to complete a course of antibiotic treatment.   2.  Acute renal failure on CKD stage IIIa -Likely secondary to a prerenal azotemia as patient noted to have presented with hypotension with systolic blood pressures in the 60s. -Creatinine on presentation at 3.78, was 1.20 on day of discharge during last hospitalization 04/08/2022. -Urinalysis nitrite negative, leukocytes negative, negative for protein.   -Urine sodium 41, urine creatinine 33.   -Renal ultrasound negative for  hydronephrosis.   -Foley catheter placed with good urine output during the hospitalization.    -Renal function trended down creatinine was at 2.05 on day of discharge from 2.68 from 3.78 on admission.   -Patient hydrated with IV fluids.   -Patient was discharged back to skilled nursing facility with hospice following.    3.  History of dementia with current aspiration in the setting of terminal dysphagia -Patient currently being followed under hospice care in the outpatient setting. -Patient with recurrent aspiration events, has been declining over several months, recently hospitalized for aspiration pneumonia. -Currently under hospice care in the outpatient setting, per hospice note patient currently a full code and has a DSS agent. -Patient noted to have had a PEG tube placed about 2 years ago in [place] and not currently being used.   -Patient has been followed by hospice and case discussed with patient's DSS agent decision made for no artificial feeds, comfort feeds, likely discharge back to SNF with hospice following once medically stable. -Patient was maintained on home regimen risperdal and Depakote. -IV Haldol as needed. -Hospice following. -Patient be discharged back to skilled nursing facility with hospice following. -MOST form filled out.   4.  Acute metabolic encephalopathy -Per EDP no patient noted to have a baseline of rolling himself around in the wheelchair and interacts verbally. -On admission patient noted to be nonverbal, not following any commands.   -Improved clinically, patient alert, some unintelligible speech however following some commands. -Likely secondary to problem #1. -Patient has been pancultured and results  pending.  -Urine cultures negative.  Blood cultures pending. -Patient maintained empirically on IV antibiotics during the hospitalization as well as IV fluids and supportive care.   -Patient was discharged back to SNF with hospice following and 5 more  days of Augmentin to complete a course of antibiotic treatment.    5.  Atrial flutter -Noted on telemetry. -Patient back in sinus rhythm with occasional PACs. -Was rate controlled, by day of discharge. -Likely secondary to problem #1. -Patient likely not a anticoagulation candidate due to history of dementia, recurrent aspiration pneumonias and declining status currently under the care of hospice. -Patient treated as a problem #1. -Patient be discharged back to skilled nursing facility with hospice following.   6.  Status post percutaneous gastrostomy tube -Patient noted to have a PEG tube placed approximately 2 years ago in [place], however currently not using and patient with modified oral diet being adjusted by speech therapist. -Patient currently under hospice care. -Due to recurrent admissions for recurrent aspiration pneumonia and history of dysphagia with dementia. -Spoke with DSS agent, [name] who is guardianship and decision made for no artificial feeds, PEG tube will be used for medications only.   -Allow comfort feeds and patient placed on dysphagia 1 diet with nectar thick liquids.   -Decision made to transfer back to SNF with hospice following.    7.  History of junctional bradycardia -Patient noted to have been evaluated by cardiology recently and deemed not a candidate for pacing as it was felt this to hasten his decline, physically and mentally.   8.  Anemia of chronic disease -Patient with no overt bleeding. -Hemoglobin was trending down, and stabilized at 7.5 from 9.4 on admission.  -Likely dilutional effect from IV fluids. -Anemia panel consistent with anemia of chronic disease with iron of 18, TIBC of 156, ferritin of 1069, vitamin B12 of 1776. -Patient was seen by hospice during the hospitalization and decision made to transfer back to facility with hospice following.    Procedures: Renal ultrasound 04/26/2022 Chest x-ray 04/26/2022     Consultations: None  Discharge Exam: Vitals:   04/28/22 0400 04/28/22 0725  BP: 124/68   Pulse:    Resp: (!) 21   Temp:  98.2 F (36.8 C)  SpO2:      General: NAD.  Alert. Cardiovascular: Regular rate rhythm no murmurs rubs or gallops.  No JVD.  No lower extremity edema. Respiratory: Some coarse rhonchorous breath sounds anterior lung fields.  Discharge Instructions   Discharge Instructions     Diet general   Complete by: As directed    Dysphagia 1 diet with nectar thick liquids.   Increase activity slowly   Complete by: As directed       Allergies as of 04/28/2022   No Known Allergies      Medication List     STOP taking these medications    amoxicillin-clavulanate 875-125 MG tablet Commonly known as: AUGMENTIN Replaced by: amoxicillin-clavulanate 250-62.5 MG/5ML suspension   feeding supplement (JEVITY 1.5 CAL/FIBER) Liqd   feeding supplement (PROSource TF20) liquid   LORazepam 0.5 MG tablet Commonly known as: ATIVAN       TAKE these medications    amoxicillin-clavulanate 250-62.5 MG/5ML suspension Commonly known as: AUGMENTIN Place 10 mLs (500 mg total) into feeding tube 2 (two) times daily for 5 days. Replaces: amoxicillin-clavulanate 875-125 MG tablet   atropine 1 % ophthalmic solution Place 4 drops under the tongue every 4 (four) hours as needed (secretions).   diclofenac Sodium  1 % Gel Commonly known as: Voltaren Apply 2 g topically 4 (four) times daily. What changed:  how much to take when to take this additional instructions   free water Soln Place 200 mLs into feeding tube every 4 (four) hours.   guaiFENesin 100 MG/5ML liquid Commonly known as: ROBITUSSIN Place 15 mLs into feeding tube every 6 (six) hours for 6 days.   haloperidol 1 MG tablet Commonly known as: HALDOL Place 1 tablet (1 mg total) into feeding tube in the morning and at bedtime. What changed: how to take this   levalbuterol 0.63 MG/3ML nebulizer  solution Commonly known as: XOPENEX Take 3 mLs (0.63 mg total) by nebulization every 6 (six) hours as needed for wheezing or shortness of breath.   morphine 20 MG/ML concentrated solution Commonly known as: ROXANOL Place 0.25 mLs (5 mg total) into feeding tube every 4 (four) hours as needed (for pain or shortness of breath). What changed: how to take this   mouth rinse Liqd solution 15 mLs by Mouth Rinse route daily for 7 days. Suction Debris from the mouth. Brush teeth with a suction toothbrush with antiseptic oral rinse. Apply moisturizer in the mouth and on the lips. This is NOT a pharmacy stocked item. Obtain from [facility].   mupirocin ointment 2 % Commonly known as: BACTROBAN Place 1 Application into the nose 2 (two) times daily for 5 days.   nicotine 14 mg/24hr patch Commonly known as: NICODERM CQ - dosed in mg/24 hours Place 1 patch (14 mg total) onto the skin daily.   ondansetron 4 MG tablet Commonly known as: ZOFRAN Place 1 tablet (4 mg total) into feeding tube every 6 (six) hours as needed for nausea.   polyethylene glycol powder 17 GM/SCOOP powder Commonly known as: GLYCOLAX/MIRALAX Place 17 g into feeding tube daily. What changed: how to take this   risperiDONE 0.5 MG tablet Commonly known as: RISPERDAL Place 1 tablet (0.5 mg total) into feeding tube at bedtime. What changed: how to take this   SM Pain & Fever Childrens 160 MG/5ML suspension Generic drug: acetaminophen Place 10.2 mLs (325 mg total) into feeding tube every 6 (six) hours as needed for mild pain, headache or fever. What changed: Another medication with the same name was removed. Continue taking this medication, and follow the directions you see here.   umeclidinium bromide 62.5 MCG/ACT Aepb Commonly known as: INCRUSE ELLIPTA Inhale 1 puff into the lungs daily. Start taking on: April 29, 2022   valproic acid 250 MG/5ML solution Commonly known as: DEPAKENE Place 5 mLs (250 mg total) into  feeding tube at bedtime. What changed: how to take this       No Known Allergies  Follow-up Information     Follow-up with hospice MD. Follow up.                   The results of significant diagnostics from this hospitalization (including imaging, microbiology, ancillary and laboratory) are listed below for reference.    Significant Diagnostic Studies: US RENAL  Result Date: 04/26/2022 CLINICAL DATA:  Acute renal failure EXAM: RENAL / URINARY TRACT ULTRASOUND COMPLETE COMPARISON:  CT 03/13/2022 FINDINGS: Right Kidney: Renal measurements: 10.7 x 3.8 x 4.9 cm = volume: 105 mL. Normal parenchymal echotexture and thickness. Simple appearing cyst measuring 1.5 cm in diameter. No imaging follow-up is indicated. No hydronephrosis or solid mass. Left Kidney: Renal measurements: 10.1 x 5 x 4.7 cm = volume: 126 mL. Echogenicity within normal limits.  No mass or hydronephrosis visualized. Bladder: Bladder is decompressed with a Foley catheter. Other: None. IMPRESSION: Normal ultrasound appearance of the kidneys. No hydronephrosis. Bladder is decompressed with a Foley catheter. Electronically Signed   By: Lucienne Capers M.D.   On: 04/26/2022 19:05   DG Chest Port 1 View  Result Date: 04/26/2022 CLINICAL DATA:  Possible sepsis. EXAM: PORTABLE CHEST 1 VIEW COMPARISON:  04/07/2022 FINDINGS: Persistent right base collapse/consolidation with new airspace disease noted in the left base. Probable layering small bilateral pleural effusions. The cardio pericardial silhouette is enlarged. Telemetry leads overlie the chest. Status post left shoulder replacement. IMPRESSION: Persistent right base collapse/consolidation with new airspace disease in the left base. Probable layering bilateral pleural effusions. Electronically Signed   By: Misty Stanley M.D.   On: 04/26/2022 16:19   DG Chest Port 1 View  Result Date: 04/07/2022 CLINICAL DATA:  Possible sepsis, pneumonia EXAM: PORTABLE CHEST 1 VIEW  COMPARISON:  03/12/2022 FINDINGS: Transverse diameter of heart is in the upper limits of normal. Thoracic aorta is tortuous. There is new large infiltrate in right mid and right lower lung fields. Small linear densities are seen in left parahilar region. There is blunting of right lateral CP angle. There is no pneumothorax. 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 IMPRESSION: There is large new infiltrate in right mid and right lower lung fields suggesting pneumonia/aspiration. Small right pleural effusion. Small linear densities in left parahilar region may suggest subsegmental atelectasis. Electronically Signed   By: Elmer Picker M.D.   On: 04/07/2022 16:53    Microbiology: Recent Results (from the past 240 hour(s))  Blood Culture (routine x 2)     Status: None (Preliminary result)   Collection Time: 04/26/22  3:37 PM   Specimen: BLOOD  Result Value Ref Range Status   Specimen Description   Final    BLOOD BLOOD RIGHT HAND Performed at [facility], [place] [address] [place], [place] [address]    Special Requests   Final    BOTTLES DRAWN AEROBIC AND ANAEROBIC Blood Culture adequate volume Performed at [facility] [address], [place], [place] [address]    Culture   Final    NO GROWTH < 24 HOURS Performed at [facility] [address], [place], [place] [address]    Report Status PENDING  Incomplete  Blood Culture (routine x 2)     Status: None (Preliminary result)   Collection Time: 04/26/22  4:12 PM   Specimen: BLOOD  Result Value Ref Range Status   Specimen Description   Final    BLOOD BLOOD LEFT FOREARM Performed at [facility] [address], [place], [place] [address]    Special Requests   Final    BOTTLES DRAWN AEROBIC ONLY Blood Culture results may not be optimal due to an inadequate volume of blood received in culture bottles Performed at [facility] [address]  [place],  [address]    Culture   Final    NO GROWTH < 24 HOURS Performed at [facility] [address], [place],  [address]    Report Status PENDING  Incomplete  Resp Panel by RT-PCR (Flu A&B, Covid) Anterior Nasal Swab     Status: None   Collection Time: 04/26/22  4:47 PM   Specimen: Anterior Nasal Swab  Result Value Ref Range Status   SARS Coronavirus 2 by RT PCR NEGATIVE NEGATIVE Final    Comment: (NOTE) SARS-CoV-2  target nucleic acids are NOT DETECTED.  The SARS-CoV-2 RNA is generally detectable in upper respiratory specimens during the acute phase of infection. The lowest concentration of SARS-CoV-2 viral copies this assay can detect is 138 copies/mL. A negative result does not preclude SARS-Cov-2 infection and should not be used as the sole basis for treatment or other patient management decisions. A negative result may occur with  improper specimen collection/handling, submission of specimen other than nasopharyngeal swab, presence of viral mutation(s) within the areas targeted by this assay, and inadequate number of viral copies(<138 copies/mL). A negative result must be combined with clinical observations, patient history, and epidemiological information. The expected result is Negative.  Fact Sheet for Patients:  EntrepreneurPulse.com.au  Fact Sheet for Healthcare Providers:  IncredibleEmployment.be  This test is no t yet approved or cleared by the [place] FDA and  has been authorized for detection and/or diagnosis of SARS-CoV-2 by FDA under an Emergency Use Authorization (EUA). This EUA will remain  in effect (meaning this test can be used) for the duration of the COVID-19 declaration under Section 564(b)(1) of the Act, 21 U.S.C.section 360bbb-3(b)(1), unless the authorization is terminated  or revoked sooner.       Influenza A by PCR NEGATIVE NEGATIVE Final   Influenza B by PCR NEGATIVE NEGATIVE Final     Comment: (NOTE) The Xpert Xpress SARS-CoV-2/FLU/RSV plus assay is intended as an aid in the diagnosis of influenza from Nasopharyngeal swab specimens and should not be used as a sole basis for treatment. Nasal washings and aspirates are unacceptable for Xpert Xpress SARS-CoV-2/FLU/RSV testing.  Fact Sheet for Patients: EntrepreneurPulse.com.au  Fact Sheet for Healthcare Providers: IncredibleEmployment.be  This test is not yet approved or cleared by the [place] FDA and has been authorized for detection and/or diagnosis of SARS-CoV-2 by FDA under an Emergency Use Authorization (EUA). This EUA will remain in effect (meaning this test can be used) for the duration of the COVID-19 declaration under Section 564(b)(1) of the Act, 21 U.S.C. section 360bbb-3(b)(1), unless the authorization is terminated or revoked.  Performed at [facility], [place] [address] [place], [place] [address]   Urine Culture     Status: None   Collection Time: 04/26/22  6:45 PM   Specimen: In/Out Cath Urine  Result Value Ref Range Status   Specimen Description   Final    IN/OUT CATH URINE Performed at [facility] [address], [place], [place] [address]    Special Requests   Final    NONE Performed at [facility], [place] [address] [place], [place] [address]    Culture   Final    NO GROWTH Performed at [facility], [place] [address], [place], [place] [address]    Report Status 04/28/2022 FINAL  Final  MRSA Next Gen by PCR, Nasal     Status: Abnormal   Collection Time: 04/26/22  9:26 PM   Specimen: Nasal Mucosa; Nasal Swab  Result Value Ref Range Status   MRSA by PCR Next Gen DETECTED (A) NOT DETECTED Final    Comment: (NOTE) The GeneXpert MRSA Assay (FDA approved for NASAL specimens only), is one component of a comprehensive MRSA colonization surveillance program. It is not intended to diagnose  MRSA infection nor to guide or monitor treatment for MRSA infections. Test performance is not FDA approved in patients less than 44 years old. Performed at [facility], [place] [address] [place], [place] [address]  Labs: Basic Metabolic Panel: Recent Labs  Lab 04/26/22 1537 04/27/22 0220 04/28/22 0254  NA 140 141 143  K 4.5 3.7 3.8  CL 102 107 107  CO2 _0 GLUCOSE 78 102* 72  BUN 55* 42* 37*  CREATININE 3.78* 2.68* 2.05*  CALCIUM 9.6 8.8* 9.2  MG 1.6* 2.3 2.0  PHOS  --  4.9*  --    Liver Function Tests: Recent Labs  Lab 04/26/22 1537 04/27/22 0220 04/28/22 0254  AST 50* 44* 35  ALT _1 ALKPHOS 58 50 47  BILITOT 1.4* 0.6 0.7  PROT 7.0 5.9* 5.9*  ALBUMIN 2.5* 2.1* 2.2*   No results for input(s): "LIPASE", "AMYLASE" in the last 168 hours. No results for input(s): "AMMONIA" in the last 168 hours. CBC: Recent Labs  Lab 04/26/22 1537 04/27/22 0220 04/28/22 0254  WBC 6.3 4.8 4.9  NEUTROABS 4.4 3.4 3.4  HGB 9.4* 7.8* 7.5*  HCT 29.4* 25.3* 23.8*  MCV 92.5 92.7 92.6  PLT 147* 150 145*   Cardiac Enzymes: No results for input(s): "CKTOTAL", "CKMB", "CKMBINDEX", "TROPONINI" in the last 168 hours. BNP: BNP (last 3 results) Recent Labs    05/10/21 1219  BNP 53.6    ProBNP (last 3 results) No results for input(s): "PROBNP" in the last 8760 hours.  CBG: Recent Labs  Lab 04/26/22 1540 04/27/22 0831 04/28/22 0804  GLUCAP 66* 80 73       Signed:  Irine Seal MD.  [facility] 04/28/2022, 9:30 AM

## 2022-05-01 LAB — CULTURE, BLOOD (ROUTINE X 2)
Culture: NO GROWTH
Culture: NO GROWTH
Special Requests: ADEQUATE

## 2022-05-26 LAB — PROINSULIN/INSULIN RATIO
Insulin: 11 u[IU]/mL
Proinsulin: 16.4 pmol/L — ABNORMAL HIGH

## 2022-07-03 ENCOUNTER — Other Ambulatory Visit: Payer: Self-pay

## 2022-07-03 ENCOUNTER — Emergency Department (HOSPITAL_COMMUNITY)

## 2022-07-03 ENCOUNTER — Inpatient Hospital Stay (HOSPITAL_COMMUNITY)
Admission: EM | Admit: 2022-07-03 | Discharge: 2022-07-07 | DRG: 871 | Disposition: A | Discharge status: Hospice | Attending: Internal Medicine | Admitting: Internal Medicine

## 2022-07-03 DIAGNOSIS — L89891 Pressure ulcer of other site, stage 1: Secondary | ICD-10-CM | POA: Diagnosis present

## 2022-07-03 DIAGNOSIS — L89150 Pressure ulcer of sacral region, unstageable: Secondary | ICD-10-CM | POA: Diagnosis present

## 2022-07-03 DIAGNOSIS — L8990 Pressure ulcer of unspecified site, unspecified stage: Secondary | ICD-10-CM | POA: Insufficient documentation

## 2022-07-03 DIAGNOSIS — D631 Anemia in chronic kidney disease: Secondary | ICD-10-CM | POA: Diagnosis present

## 2022-07-03 DIAGNOSIS — J189 Pneumonia, unspecified organism: Secondary | ICD-10-CM | POA: Diagnosis present

## 2022-07-03 DIAGNOSIS — Z6822 Body mass index (BMI) 22.0-22.9, adult: Secondary | ICD-10-CM

## 2022-07-03 DIAGNOSIS — Z1152 Encounter for screening for COVID-19: Secondary | ICD-10-CM

## 2022-07-03 DIAGNOSIS — E860 Dehydration: Secondary | ICD-10-CM | POA: Diagnosis present

## 2022-07-03 DIAGNOSIS — N179 Acute kidney failure, unspecified: Secondary | ICD-10-CM | POA: Diagnosis not present

## 2022-07-03 DIAGNOSIS — N390 Urinary tract infection, site not specified: Secondary | ICD-10-CM | POA: Diagnosis present

## 2022-07-03 DIAGNOSIS — B9620 Unspecified Escherichia coli [E. coli] as the cause of diseases classified elsewhere: Secondary | ICD-10-CM | POA: Diagnosis present

## 2022-07-03 DIAGNOSIS — Z1612 Extended spectrum beta lactamase (ESBL) resistance: Secondary | ICD-10-CM | POA: Diagnosis present

## 2022-07-03 DIAGNOSIS — Z7951 Long term (current) use of inhaled steroids: Secondary | ICD-10-CM

## 2022-07-03 DIAGNOSIS — R1310 Dysphagia, unspecified: Secondary | ICD-10-CM | POA: Diagnosis present

## 2022-07-03 DIAGNOSIS — D649 Anemia, unspecified: Secondary | ICD-10-CM

## 2022-07-03 DIAGNOSIS — A419 Sepsis, unspecified organism: Secondary | ICD-10-CM | POA: Diagnosis not present

## 2022-07-03 DIAGNOSIS — N1832 Chronic kidney disease, stage 3b: Secondary | ICD-10-CM | POA: Diagnosis present

## 2022-07-03 DIAGNOSIS — R627 Adult failure to thrive: Secondary | ICD-10-CM | POA: Diagnosis present

## 2022-07-03 DIAGNOSIS — L98423 Non-pressure chronic ulcer of back with necrosis of muscle: Secondary | ICD-10-CM

## 2022-07-03 DIAGNOSIS — F17210 Nicotine dependence, cigarettes, uncomplicated: Secondary | ICD-10-CM | POA: Diagnosis present

## 2022-07-03 DIAGNOSIS — E43 Unspecified severe protein-calorie malnutrition: Secondary | ICD-10-CM | POA: Diagnosis present

## 2022-07-03 DIAGNOSIS — G9341 Metabolic encephalopathy: Secondary | ICD-10-CM | POA: Diagnosis present

## 2022-07-03 DIAGNOSIS — D638 Anemia in other chronic diseases classified elsewhere: Secondary | ICD-10-CM | POA: Diagnosis present

## 2022-07-03 DIAGNOSIS — Z79899 Other long term (current) drug therapy: Secondary | ICD-10-CM

## 2022-07-03 DIAGNOSIS — F0390 Unspecified dementia without behavioral disturbance: Secondary | ICD-10-CM | POA: Diagnosis present

## 2022-07-03 DIAGNOSIS — R6520 Severe sepsis without septic shock: Secondary | ICD-10-CM | POA: Diagnosis present

## 2022-07-03 DIAGNOSIS — Z66 Do not resuscitate: Secondary | ICD-10-CM | POA: Diagnosis present

## 2022-07-03 DIAGNOSIS — A498 Other bacterial infections of unspecified site: Secondary | ICD-10-CM | POA: Insufficient documentation

## 2022-07-03 DIAGNOSIS — L89611 Pressure ulcer of right heel, stage 1: Secondary | ICD-10-CM | POA: Diagnosis present

## 2022-07-03 DIAGNOSIS — Z515 Encounter for palliative care: Secondary | ICD-10-CM

## 2022-07-03 DIAGNOSIS — Z931 Gastrostomy status: Secondary | ICD-10-CM

## 2022-07-03 DIAGNOSIS — K219 Gastro-esophageal reflux disease without esophagitis: Secondary | ICD-10-CM | POA: Diagnosis present

## 2022-07-03 DIAGNOSIS — I129 Hypertensive chronic kidney disease with stage 1 through stage 4 chronic kidney disease, or unspecified chronic kidney disease: Secondary | ICD-10-CM | POA: Diagnosis present

## 2022-07-03 DIAGNOSIS — L89621 Pressure ulcer of left heel, stage 1: Secondary | ICD-10-CM | POA: Diagnosis present

## 2022-07-03 DIAGNOSIS — D500 Iron deficiency anemia secondary to blood loss (chronic): Secondary | ICD-10-CM | POA: Diagnosis present

## 2022-07-03 LAB — MAGNESIUM: Magnesium: 2.2 mg/dL (ref 1.7–2.4)

## 2022-07-03 LAB — COMPREHENSIVE METABOLIC PANEL
ALT: 46 U/L — ABNORMAL HIGH (ref 0–44)
AST: 63 U/L — ABNORMAL HIGH (ref 15–41)
Albumin: 1.8 g/dL — ABNORMAL LOW (ref 3.5–5.0)
Alkaline Phosphatase: 52 U/L (ref 38–126)
Anion gap: 10 (ref 5–15)
BUN: 59 mg/dL — ABNORMAL HIGH (ref 8–23)
CO2: 25 mmol/L (ref 22–32)
Calcium: 8.4 mg/dL — ABNORMAL LOW (ref 8.9–10.3)
Chloride: 108 mmol/L (ref 98–111)
Creatinine, Ser: 3.46 mg/dL — ABNORMAL HIGH (ref 0.61–1.24)
GFR, Estimated: 18 mL/min — ABNORMAL LOW (ref >60)
Glucose, Bld: 121 mg/dL — ABNORMAL HIGH (ref 70–99)
Potassium: 4.7 mmol/L (ref 3.5–5.1)
Sodium: 143 mmol/L (ref 135–145)
Total Bilirubin: 0.6 mg/dL (ref 0.3–1.2)
Total Protein: 7.9 g/dL (ref 6.5–8.1)

## 2022-07-03 LAB — CBC WITH DIFFERENTIAL/PLATELET
Abs Immature Granulocytes: 0.06 10*3/uL (ref 0.00–0.07)
Basophils Absolute: 0 10*3/uL (ref 0.0–0.1)
Basophils Relative: 0 %
Eosinophils Absolute: 0 10*3/uL (ref 0.0–0.5)
Eosinophils Relative: 0 %
HCT: 21.1 % — ABNORMAL LOW (ref 39.0–52.0)
Hemoglobin: 6.1 g/dL — CL (ref 13.0–17.0)
Immature Granulocytes: 1 %
Lymphocytes Relative: 16 %
Lymphs Abs: 2 10*3/uL (ref 0.7–4.0)
MCH: 27.1 pg (ref 26.0–34.0)
MCHC: 28.9 g/dL — ABNORMAL LOW (ref 30.0–36.0)
MCV: 93.8 fL (ref 80.0–100.0)
Monocytes Absolute: 1.2 10*3/uL — ABNORMAL HIGH (ref 0.1–1.0)
Monocytes Relative: 10 %
Neutro Abs: 8.9 10*3/uL — ABNORMAL HIGH (ref 1.7–7.7)
Neutrophils Relative %: 73 %
Platelets: 290 10*3/uL (ref 150–400)
RBC: 2.25 MIL/uL — ABNORMAL LOW (ref 4.22–5.81)
RDW: 15.9 % — ABNORMAL HIGH (ref 11.5–15.5)
WBC: 12.1 10*3/uL — ABNORMAL HIGH (ref 4.0–10.5)
nRBC: 0 % (ref 0.0–0.2)

## 2022-07-03 LAB — I-STAT CHEM 8, ED
BUN: 62 mg/dL — ABNORMAL HIGH (ref 8–23)
Calcium, Ion: 1.06 mmol/L — ABNORMAL LOW (ref 1.15–1.40)
Chloride: 108 mmol/L (ref 98–111)
Creatinine, Ser: 3.4 mg/dL — ABNORMAL HIGH (ref 0.61–1.24)
Glucose, Bld: 117 mg/dL — ABNORMAL HIGH (ref 70–99)
HCT: 21 % — ABNORMAL LOW (ref 39.0–52.0)
Hemoglobin: 7.1 g/dL — ABNORMAL LOW (ref 13.0–17.0)
Potassium: 5 mmol/L (ref 3.5–5.1)
Sodium: 144 mmol/L (ref 135–145)
TCO2: 27 mmol/L (ref 22–32)

## 2022-07-03 LAB — RESP PANEL BY RT-PCR (RSV, FLU A&B, COVID)  RVPGX2
Influenza A by PCR: NEGATIVE
Influenza B by PCR: NEGATIVE
Resp Syncytial Virus by PCR: NEGATIVE
SARS Coronavirus 2 by RT PCR: NEGATIVE

## 2022-07-03 LAB — PROTIME-INR
INR: 1.4 — ABNORMAL HIGH (ref 0.8–1.2)
Prothrombin Time: 17.3 seconds — ABNORMAL HIGH (ref 11.4–15.2)

## 2022-07-03 LAB — LACTIC ACID, PLASMA: Lactic Acid, Venous: 1.3 mmol/L (ref 0.5–1.9)

## 2022-07-03 MED ORDER — LACTATED RINGERS IV BOLUS (SEPSIS)
1000.0000 mL | Freq: Once | INTRAVENOUS | Status: AC
  Administered 2022-07-04: 1000 mL via INTRAVENOUS

## 2022-07-03 MED ORDER — VANCOMYCIN HCL IN DEXTROSE 1-5 GM/200ML-% IV SOLN
1000.0000 mg | Freq: Once | INTRAVENOUS | Status: AC
  Administered 2022-07-03: 1000 mg via INTRAVENOUS
  Filled 2022-07-03: qty 200

## 2022-07-03 MED ORDER — ACETAMINOPHEN 650 MG RE SUPP
650.0000 mg | Freq: Once | RECTAL | Status: AC
  Administered 2022-07-03: 650 mg via RECTAL
  Filled 2022-07-03: qty 1

## 2022-07-03 MED ORDER — SODIUM CHLORIDE 0.9% IV SOLUTION: Freq: Once | INTRAVENOUS | Status: AC

## 2022-07-03 MED ORDER — LACTATED RINGERS IV BOLUS (SEPSIS)
1000.0000 mL | Freq: Once | INTRAVENOUS | Status: AC
  Administered 2022-07-03: 1000 mL via INTRAVENOUS

## 2022-07-03 MED ORDER — METRONIDAZOLE 500 MG/100ML IV SOLN
500.0000 mg | Freq: Once | INTRAVENOUS | Status: AC
  Administered 2022-07-03: 500 mg via INTRAVENOUS
  Filled 2022-07-03: qty 100

## 2022-07-03 MED ORDER — LACTATED RINGERS IV SOLN: INTRAVENOUS | Status: DC

## 2022-07-03 MED ORDER — SODIUM CHLORIDE 0.9 % IV SOLN
2.0000 g | Freq: Once | INTRAVENOUS | Status: AC
  Administered 2022-07-03: 2 g via INTRAVENOUS
  Filled 2022-07-03: qty 12.5

## 2022-07-03 NOTE — ED Provider Notes (Signed)
[facility]-EMERGENCY DEPT Provider Note   CSN: [id number] Arrival date & time: 07/03/22  2202     History {Add pertinent medical, surgical, social history, OB history to HPI:1} No chief complaint on file.   Jack Rivas is a 76 y.o. male.  HPI Patient presents for fever.  Medical history includes dementia, CKD, atrial flutter, anemia.  He resides at [facility] nursing facility.  Staff at facility informed EMS that he had a new fever today.  He has had recent decreased activity and decreased appetite.  Patient has a G-tube but he reportedly take some food by mouth.  EMS noted tactile fever and hypotension.  Heart rate was in the range of 95.  Patient is unable to provide history.  Per chart review, patient was admitted to the hospital for sepsis 2 months ago.  At time of discharge, he was sent to facility on hospice with a MOST form filled out.  Today, EMS reports that he remains on hospice but is now a full code.    Home Medications Prior to Admission medications   Medication Sig Start Date End Date Taking? Authorizing Provider  acetaminophen (TYLENOL) 160 MG/5ML suspension Place 10.2 mLs (325 mg total) into feeding tube every 6 (six) hours as needed for mild pain, headache or fever. Patient not taking: Reported on 04/26/2022 03/06/22   Maretta Bees, MD  atropine 1 % ophthalmic solution Place 4 drops under the tongue every 4 (four) hours as needed (secretions).    [provider]  diclofenac Sodium (VOLTAREN) 1 % GEL Apply 2 g topically 4 (four) times daily. Patient taking differently: Apply 4 g topically See admin instructions. Apply 4 grams to the legs four times a day 03/06/22   Maretta Bees, MD  haloperidol (HALDOL) 1 MG tablet Place 1 tablet (1 mg total) into feeding tube in the morning and at bedtime. 04/28/22   Rodolph Bong, MD  levalbuterol Pauline Aus) 0.63 MG/3ML nebulizer solution Take 3 mLs (0.63 mg total) by nebulization every 6  (six) hours as needed for wheezing or shortness of breath. 04/28/22   Rodolph Bong, MD  morphine (ROXANOL) 20 MG/ML concentrated solution Place 0.25 mLs (5 mg total) into feeding tube every 4 (four) hours as needed (for pain or shortness of breath). 04/28/22   Rodolph Bong, MD  nicotine (NICODERM CQ - DOSED IN MG/24 HOURS) 14 mg/24hr patch Place 1 patch (14 mg total) onto the skin daily. 04/28/22   Rodolph Bong, MD  ondansetron (ZOFRAN) 4 MG tablet Place 1 tablet (4 mg total) into feeding tube every 6 (six) hours as needed for nausea. 04/28/22   Rodolph Bong, MD  polyethylene glycol powder (GLYCOLAX/MIRALAX) 17 GM/SCOOP powder Place 17 g into feeding tube daily. Patient taking differently: Take 17 g by mouth daily. 03/06/22   Ghimire, Werner Lean, MD  risperiDONE (RISPERDAL) 0.5 MG tablet Place 1 tablet (0.5 mg total) into feeding tube at bedtime. 04/28/22   Rodolph Bong, MD  umeclidinium bromide (INCRUSE ELLIPTA) 62.5 MCG/ACT AEPB Inhale 1 puff into the lungs daily. 04/29/22   Rodolph Bong, MD  valproic acid (DEPAKENE) 250 MG/5ML solution Place 5 mLs (250 mg total) into feeding tube at bedtime. 04/28/22   Rodolph Bong, MD      Allergies    Patient has no known allergies.    Review of Systems   Review of Systems  Unable to perform ROS: Patient nonverbal    Physical Exam  Updated Vital Signs There were no vitals taken for this visit. Physical Exam Vitals and nursing note reviewed.  Constitutional:      General: He is not in acute distress.    Appearance: He is well-developed. He is cachectic. He is ill-appearing. He is not diaphoretic.  HENT:     Head: Normocephalic and atraumatic.     Right Ear: External ear normal.     Left Ear: External ear normal.     Mouth/Throat:     Mouth: Mucous membranes are dry.  Eyes:     Conjunctiva/sclera: Conjunctivae normal.  Cardiovascular:     Rate and Rhythm: Normal rate and regular rhythm.     Heart sounds:  No murmur heard. Pulmonary:     Effort: Pulmonary effort is normal. No respiratory distress.     Breath sounds: Normal breath sounds. No wheezing or rales.  Abdominal:     General: There is no distension.     Palpations: Abdomen is soft.     Tenderness: There is no abdominal tenderness.     Comments: PEG tube in place  Musculoskeletal:        General: No swelling.     Cervical back: Neck supple. No tenderness.     Right lower leg: No edema.     Left lower leg: No edema.     Comments: Soft boots on bilateral feet and ankles; large unstageable sacral ulcer  Skin:    General: Skin is warm and dry.     Coloration: Skin is not jaundiced.  Neurological:     Mental Status: He is alert.     GCS: GCS eye subscore is 4. GCS verbal subscore is 1. GCS motor subscore is 5.     ED Results / Procedures / Treatments   Labs (all labs ordered are listed, but only abnormal results are displayed) Labs Reviewed - No data to display  EKG None  Radiology No results found.  Procedures Procedures  {Document cardiac monitor, telemetry assessment procedure when appropriate:1}  Medications Ordered in ED Medications - No data to display  ED Course/ Medical Decision Making/ A&P                           Medical Decision Making Amount and/or Complexity of Data Reviewed Labs: ordered. Radiology: ordered. ECG/medicine tests: ordered.  Risk OTC drugs. Prescription drug management.   This patient presents to the ED for concern of ***, this involves an extensive number of treatment options, and is a complaint that carries with it a high risk of complications and morbidity.  The differential diagnosis includes ***   Co morbidities that complicate the patient evaluation  ***   Additional history obtained:  Additional history obtained from *** External records from outside source obtained and reviewed including ***   Lab Tests:  I Ordered, and personally interpreted labs.  The  pertinent results include:  ***   Imaging Studies ordered:  I ordered imaging studies including ***  I independently visualized and interpreted imaging which showed *** I agree with the radiologist interpretation   Cardiac Monitoring: / EKG:  The patient was maintained on a cardiac monitor.  I personally viewed and interpreted the cardiac monitored which showed an underlying rhythm of: ***   Consultations Obtained:  I requested consultation with the ***,  and discussed lab and imaging findings as well as pertinent plan - they recommend: ***   Problem List / ED Course / Critical  interventions / Medication management  Patient presenting for fever and decreased activity.  He resides in skilled nursing facility.  Patient is ill-appearing on arrival.  He is not able to provide any history.  Rectal temperature was 103.5 degrees.  Patient was started on sepsis workup and treatment.  On review of chart, patient was last admitted for sepsis 2 months ago.  At that time, his baseline was reportedly verbal and able to wheel himself around in a wheelchair.  Current mental and activity baseline is unknown.  EKG shows what appears to be sinus bradycardia with intermittent PACs resulting in a normal heart rate.  Per chart review, patient was previously evaluated by cardiology and deemed to not be a candidate for pacemaker.  I spoke with patient's DSS legal guardian, Ms. Jack Rivas [phone]).  Ms. Elesa Massed has recently taken this patient on as a case.  She is not clear what his mental or physical baseline is.  She was at [facility] earlier today and did see him resting in the bed.  He did not speak to her at that time.  Mrs. Jack Rivas is unaware of why his family is not involved in his legal guardianship.  She does confirm that he is currently a full code and this is because family was not comfortable with making him a DNR while at his facility.***. I ordered medication including ***  for ***  Reevaluation of  the patient after these medicines showed that the patient {resolved/improved/worsened:23923::"improved"} I have reviewed the patients home medicines and have made adjustments as needed   Social Determinants of Health:  ***   Test / Admission - Considered:  ***   {Document critical care time when appropriate:1} {Document review of labs and clinical decision tools ie heart score, Chads2Vasc2 etc:1}  {Document your independent review of radiology images, and any outside records:1} {Document your discussion with family members, caretakers, and with consultants:1} {Document social determinants of health affecting pt's care:1} {Document your decision making why or why not admission, treatments were needed:1} Final Clinical Impression(s) / ED Diagnoses Final diagnoses:  None    Rx / DC Orders ED Discharge Orders     None

## 2022-07-03 NOTE — ED Triage Notes (Signed)
Pt to ED via EMS from [facility] nursing home. EMS states that pt had a temp of 104 at facility and came down to 99 after tylenol. Pt is nonverbal with a hx of dementia. Pt has had decreased appetite and decreased output. Pt is on hospice but full code.  EMS statess  BP 76/48 HR95 RR 30 O2 97% on 3L CBG 144

## 2022-07-03 NOTE — Sepsis Progress Note (Signed)
Following per sepsis protocol   

## 2022-07-04 ENCOUNTER — Emergency Department (HOSPITAL_COMMUNITY)

## 2022-07-04 DIAGNOSIS — R627 Adult failure to thrive: Secondary | ICD-10-CM | POA: Diagnosis present

## 2022-07-04 DIAGNOSIS — L89611 Pressure ulcer of right heel, stage 1: Secondary | ICD-10-CM | POA: Diagnosis present

## 2022-07-04 DIAGNOSIS — R6520 Severe sepsis without septic shock: Secondary | ICD-10-CM | POA: Diagnosis present

## 2022-07-04 DIAGNOSIS — Z66 Do not resuscitate: Secondary | ICD-10-CM

## 2022-07-04 DIAGNOSIS — G9341 Metabolic encephalopathy: Secondary | ICD-10-CM

## 2022-07-04 DIAGNOSIS — J189 Pneumonia, unspecified organism: Secondary | ICD-10-CM | POA: Diagnosis present

## 2022-07-04 DIAGNOSIS — E860 Dehydration: Secondary | ICD-10-CM | POA: Diagnosis present

## 2022-07-04 DIAGNOSIS — D500 Iron deficiency anemia secondary to blood loss (chronic): Secondary | ICD-10-CM | POA: Diagnosis present

## 2022-07-04 DIAGNOSIS — Z7189 Other specified counseling: Secondary | ICD-10-CM | POA: Diagnosis not present

## 2022-07-04 DIAGNOSIS — F17210 Nicotine dependence, cigarettes, uncomplicated: Secondary | ICD-10-CM | POA: Diagnosis present

## 2022-07-04 DIAGNOSIS — Z931 Gastrostomy status: Secondary | ICD-10-CM | POA: Diagnosis not present

## 2022-07-04 DIAGNOSIS — K219 Gastro-esophageal reflux disease without esophagitis: Secondary | ICD-10-CM | POA: Diagnosis present

## 2022-07-04 DIAGNOSIS — N1832 Chronic kidney disease, stage 3b: Secondary | ICD-10-CM | POA: Diagnosis present

## 2022-07-04 DIAGNOSIS — N179 Acute kidney failure, unspecified: Secondary | ICD-10-CM | POA: Diagnosis not present

## 2022-07-04 DIAGNOSIS — N390 Urinary tract infection, site not specified: Secondary | ICD-10-CM | POA: Diagnosis present

## 2022-07-04 DIAGNOSIS — Z1612 Extended spectrum beta lactamase (ESBL) resistance: Secondary | ICD-10-CM | POA: Diagnosis present

## 2022-07-04 DIAGNOSIS — L89621 Pressure ulcer of left heel, stage 1: Secondary | ICD-10-CM | POA: Diagnosis present

## 2022-07-04 DIAGNOSIS — E43 Unspecified severe protein-calorie malnutrition: Secondary | ICD-10-CM | POA: Diagnosis present

## 2022-07-04 DIAGNOSIS — F0390 Unspecified dementia without behavioral disturbance: Secondary | ICD-10-CM | POA: Diagnosis present

## 2022-07-04 DIAGNOSIS — Z1152 Encounter for screening for COVID-19: Secondary | ICD-10-CM | POA: Diagnosis not present

## 2022-07-04 DIAGNOSIS — I129 Hypertensive chronic kidney disease with stage 1 through stage 4 chronic kidney disease, or unspecified chronic kidney disease: Secondary | ICD-10-CM | POA: Diagnosis present

## 2022-07-04 DIAGNOSIS — Z515 Encounter for palliative care: Secondary | ICD-10-CM

## 2022-07-04 DIAGNOSIS — A419 Sepsis, unspecified organism: Secondary | ICD-10-CM | POA: Diagnosis present

## 2022-07-04 DIAGNOSIS — L89150 Pressure ulcer of sacral region, unstageable: Secondary | ICD-10-CM | POA: Diagnosis present

## 2022-07-04 DIAGNOSIS — D631 Anemia in chronic kidney disease: Secondary | ICD-10-CM | POA: Diagnosis present

## 2022-07-04 LAB — CBC
HCT: 18.9 % — ABNORMAL LOW (ref 39.0–52.0)
Hemoglobin: 5.7 g/dL — CL (ref 13.0–17.0)
MCH: 27.9 pg (ref 26.0–34.0)
MCHC: 30.2 g/dL (ref 30.0–36.0)
MCV: 92.6 fL (ref 80.0–100.0)
Platelets: 246 10*3/uL (ref 150–400)
RBC: 2.04 MIL/uL — ABNORMAL LOW (ref 4.22–5.81)
RDW: 15.6 % — ABNORMAL HIGH (ref 11.5–15.5)
WBC: 10.8 10*3/uL — ABNORMAL HIGH (ref 4.0–10.5)
nRBC: 0 % (ref 0.0–0.2)

## 2022-07-04 LAB — URINALYSIS, ROUTINE W REFLEX MICROSCOPIC
Bilirubin Urine: NEGATIVE
Glucose, UA: NEGATIVE mg/dL
Ketones, ur: 5 mg/dL — AB
Nitrite: NEGATIVE
Protein, ur: 100 mg/dL — AB
Specific Gravity, Urine: 1.011 (ref 1.005–1.030)
WBC, UA: >50 WBC/hpf — ABNORMAL HIGH (ref 0–5)
pH: 6 (ref 5.0–8.0)

## 2022-07-04 LAB — PREPARE RBC (CROSSMATCH)

## 2022-07-04 LAB — MRSA NEXT GEN BY PCR, NASAL: MRSA by PCR Next Gen: DETECTED — AB

## 2022-07-04 LAB — CREATININE, SERUM
Creatinine, Ser: 2.54 mg/dL — ABNORMAL HIGH (ref 0.61–1.24)
GFR, Estimated: 25 mL/min — ABNORMAL LOW (ref >60)

## 2022-07-04 LAB — ABO/RH: ABO/RH(D): A POS

## 2022-07-04 LAB — HEMOGLOBIN AND HEMATOCRIT, BLOOD
HCT: 24 % — ABNORMAL LOW (ref 39.0–52.0)
Hemoglobin: 7.1 g/dL — ABNORMAL LOW (ref 13.0–17.0)

## 2022-07-04 LAB — POC OCCULT BLOOD, ED: Fecal Occult Bld: POSITIVE — AB

## 2022-07-04 LAB — GLUCOSE, CAPILLARY: Glucose-Capillary: 97 mg/dL (ref 70–99)

## 2022-07-04 LAB — LACTIC ACID, PLASMA: Lactic Acid, Venous: 1.3 mmol/L (ref 0.5–1.9)

## 2022-07-04 MED ORDER — ENOXAPARIN SODIUM 30 MG/0.3ML IJ SOSY: 30.0000 mg | PREFILLED_SYRINGE | INTRAMUSCULAR | Status: DC

## 2022-07-04 MED ORDER — MUPIROCIN 2 % EX OINT
1.0000 | TOPICAL_OINTMENT | Freq: Two times a day (BID) | CUTANEOUS | Status: DC
  Administered 2022-07-04 – 2022-07-07 (×6): 1 via NASAL
  Filled 2022-07-04 (×3): qty 22

## 2022-07-04 MED ORDER — ORAL CARE MOUTH RINSE
15.0000 mL | OROMUCOSAL | Status: DC
  Administered 2022-07-04 – 2022-07-07 (×11): 15 mL via OROMUCOSAL

## 2022-07-04 MED ORDER — LACTATED RINGERS IV SOLN: INTRAVENOUS | Status: DC

## 2022-07-04 MED ORDER — PANTOPRAZOLE SODIUM 40 MG IV SOLR
40.0000 mg | Freq: Two times a day (BID) | INTRAVENOUS | Status: DC
  Administered 2022-07-04 – 2022-07-06 (×6): 40 mg via INTRAVENOUS
  Filled 2022-07-04 (×6): qty 10

## 2022-07-04 MED ORDER — VANCOMYCIN HCL 750 MG/150ML IV SOLN
750.0000 mg | INTRAVENOUS | Status: DC
  Administered 2022-07-04 – 2022-07-06 (×2): 750 mg via INTRAVENOUS
  Filled 2022-07-04 (×2): qty 150

## 2022-07-04 MED ORDER — SODIUM CHLORIDE 0.9 % IV SOLN
2.0000 g | INTRAVENOUS | Status: DC
  Administered 2022-07-05: 2 g via INTRAVENOUS
  Filled 2022-07-04: qty 12.5

## 2022-07-04 MED ORDER — ORAL CARE MOUTH RINSE: 15.0000 mL | OROMUCOSAL | Status: DC | PRN

## 2022-07-04 MED ORDER — VANCOMYCIN HCL IN DEXTROSE 1-5 GM/200ML-% IV SOLN: 1000.0000 mg | INTRAVENOUS | Status: DC

## 2022-07-04 MED ORDER — DAKINS (1/4 STRENGTH) 0.125 % EX SOLN
Freq: Every day | CUTANEOUS | Status: DC
  Filled 2022-07-04: qty 473

## 2022-07-04 MED ORDER — CHLORHEXIDINE GLUCONATE CLOTH 2 % EX PADS
6.0000 | MEDICATED_PAD | Freq: Every day | CUTANEOUS | Status: DC
  Administered 2022-07-04 – 2022-07-06 (×2): 6 via TOPICAL

## 2022-07-04 NOTE — Progress Notes (Signed)
Patient discussed at length with care team and he is well known to the palliative care service from previous admissions. 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 He is on hospice at LTC facility. Very poor functional status and no ability to recover his independence. He has a legal guardian w DSS, but family participates in decision making.  It is my professional opinion that CPR when he becomes pulseless and stops breathing would not be successful- it would not prevent his death nor restore his life. I strongly advise DNR, Comfort Measures vs.DNR,Limited Interventions and this would be in line with hospice philosophy of care. Any intervention done to maintain his life in his current state is likely contributing to his suffering. I support DNR and transition to comfort care.  Anderson Malta, DO Palliative Medicine

## 2022-07-04 NOTE — Progress Notes (Signed)
PHARMACY NOTE:  ANTIMICROBIAL RENAL DOSAGE ADJUSTMENT  Current antimicrobial regimen includes a mismatch between antimicrobial dosage and estimated renal function.  As per policy approved by the Pharmacy & Therapeutics and Medical Executive Committees, the antimicrobial dosage will be adjusted accordingly.  Current antimicrobial dosage:  Vancomycin 1000 mg IV q36h  Indication: Sacral decubitus ulcer  Renal Function: Improved  Estimated Creatinine Clearance: 28.8 mL/min (A) (by C-G formula based on SCr of 2.54 mg/dL (H)). []      On intermittent HD, scheduled: []      On CRRT    Antimicrobial dosage has been changed to:  Vancomycin 750 mg IV q24h for estimated AUC of 425.  , PharmD 07/04/22 10:04 AM

## 2022-07-04 NOTE — ED Notes (Addendum)
bladder scanned pt, pt is retaining 526 mL of urine in bladder.

## 2022-07-04 NOTE — H&P (Addendum)
History and Physical  Jack Rivas [id number] DOB: 1946/03/29 DOA: 07/03/2022  Referring physician: Dr. Doren Custard, [place]. PCP: [name], MD  Outpatient Specialists: Palliative care team Patient coming from: SNF.  Chief Complaint: Altered mental status, generalized weakness, fever  HPI: Jack Rivas is a 76 y.o. male with medical history significant for severe malnutrition with dysphagia status post PEG tube placement, recurrent aspiration pneumonia, worsening dementia on hospice, CKD 3B, who presented from SNF due to altered mental status, generalized weakness and subjective fevers at the facility.  EMS was activated.  Upon EMS arrival the patient was noted to be hypotensive, responded well to IV fluid bolus.  In the ED, on physical exam he is ill-appearing, with dry mucous membranes, noted to have an unstageable sacral decubitus ulcer.  Lab studies were significant for hemoglobin of 6.1 for which 2 units PRBCs were ordered to be transfused.  CT chest abdomen and pelvis without contrast showed right lower lobe consolidation concerning for pneumonia.  Cultures were obtained.  The patient was started on broad-spectrum IV antibiotics IV cefepime and IV vancomycin.  Admitted by [facility], hospitalist service.  ED Course: Tmax 103.5.  BP 101/49, pulse 72, respiratory rate 15, O2 saturation 100% on 3 L.  Lab studies remarkable for BUN 62, creatinine of 3.40.  GFR 18.  Baseline creatinine 2.05 with GFR 33.  WBC 12.1.  Hemoglobin 6.1.  Review of Systems: Review of systems as noted in the HPI. All other systems reviewed and are negative.   Past Medical History:  Diagnosis Date   Aspiration pneumonia ([place]) 03/20/2021   Dementia ([place])    HCAP (healthcare-associated pneumonia) 04/20/2021   Hypertension    Hypoglycemia 10/25/2021   Severe sepsis ([place]) 04/20/2021   Uses feeding tube    Past Surgical History:  Procedure Laterality Date   CATARACT EXTRACTION, BILATERAL     KNEE SURGERY      Social  History:  reports that he has been smoking. He has never used smokeless tobacco. He reports that he does not currently use alcohol. He reports that he does not use drugs.   No Known Allergies  Family history: None obtained due to lethargy.  Prior to Admission medications   Medication Sig Start Date End Date Taking? Authorizing Provider  acetaminophen (TYLENOL) 160 MG/5ML suspension Place 10.2 mLs (325 mg total) into feeding tube every 6 (six) hours as needed for mild pain, headache or fever. Patient not taking: Reported on 04/26/2022 03/06/22   Jonetta Osgood, MD  atropine 1 % ophthalmic solution Place 4 drops under the tongue every 4 (four) hours as needed (secretions).    [provider]  diclofenac Sodium (VOLTAREN) 1 % GEL Apply 2 g topically 4 (four) times daily. Patient taking differently: Apply 4 g topically See admin instructions. Apply 4 grams to the legs four times a day 03/06/22   Jonetta Osgood, MD  haloperidol (HALDOL) 1 MG tablet Place 1 tablet (1 mg total) into feeding tube in the morning and at bedtime. 04/28/22   Eugenie Filler, MD  levalbuterol Penne Lash) 0.63 MG/3ML nebulizer solution Take 3 mLs (0.63 mg total) by nebulization every 6 (six) hours as needed for wheezing or shortness of breath. 04/28/22   Eugenie Filler, MD  morphine (ROXANOL) 20 MG/ML concentrated solution Place 0.25 mLs (5 mg total) into feeding tube every 4 (four) hours as needed (for pain or shortness of breath). 04/28/22   Eugenie Filler, MD  nicotine (NICODERM CQ - DOSED IN MG/24  HOURS) 14 mg/24hr patch Place 1 patch (14 mg total) onto the skin daily. 04/28/22   Eugenie Filler, MD  ondansetron (ZOFRAN) 4 MG tablet Place 1 tablet (4 mg total) into feeding tube every 6 (six) hours as needed for nausea. 04/28/22   Eugenie Filler, MD  polyethylene glycol powder (GLYCOLAX/MIRALAX) 17 GM/SCOOP powder Place 17 g into feeding tube daily. Patient taking differently: Take 17 g by  mouth daily. 03/06/22   Ghimire, Henreitta Leber, MD  risperiDONE (RISPERDAL) 0.5 MG tablet Place 1 tablet (0.5 mg total) into feeding tube at bedtime. 04/28/22   Eugenie Filler, MD  umeclidinium bromide (INCRUSE ELLIPTA) 62.5 MCG/ACT AEPB Inhale 1 puff into the lungs daily. 04/29/22   Eugenie Filler, MD  valproic acid (DEPAKENE) 250 MG/5ML solution Place 5 mLs (250 mg total) into feeding tube at bedtime. 04/28/22   Eugenie Filler, MD    Physical Exam: BP (!) 101/49   Pulse 72   Temp (!) 97.5 F (36.4 C)   Resp 15   Ht 6\' 2"  (1.88 m)   Wt 86.7 kg   SpO2 100%   BMI 24.54 kg/m   General: 76 y.o. year-old male well developed well nourished in no acute distress.  Lethargic.   Cardiovascular: Regular rate and rhythm with no rubs or gallops.  No thyromegaly or JVD noted.  No lower extremity edema. 2/4 pulses in all 4 extremities. Respiratory: Mild rales at bases.  Poor inspiratory effort.  No wheezing noted. Abdomen: Soft nontender nondistended with normal bowel sounds x4 quadrants. Muskuloskeletal: No cyanosis, clubbing or edema noted bilaterally Neuro: CN II-XII intact, strength, sensation, reflexes Skin:    Psychiatry: Unable to assess mood or judgment due to lethargy.         Labs on Admission:  Basic Metabolic Panel: Recent Labs  Lab 07/03/22 2315 07/03/22 2331  NA 143 144  K 4.7 5.0  CL 108 108  CO2 25  --   GLUCOSE 121* 117*  BUN 59* 62*  CREATININE 3.46* 3.40*  CALCIUM 8.4*  --   MG 2.2  --    Liver Function Tests: Recent Labs  Lab 07/03/22 2315  AST 63*  ALT 46*  ALKPHOS 52  BILITOT 0.6  PROT 7.9  ALBUMIN 1.8*   No results for input(s): "LIPASE", "AMYLASE" in the last 168 hours. No results for input(s): "AMMONIA" in the last 168 hours. CBC: Recent Labs  Lab 07/03/22 2315 07/03/22 2331  WBC 12.1*  --   NEUTROABS 8.9*  --   HGB 6.1* 7.1*  HCT 21.1* 21.0*  MCV 93.8  --   PLT 290  --    Cardiac Enzymes: No results for input(s): "CKTOTAL",  "CKMB", "CKMBINDEX", "TROPONINI" in the last 168 hours.  BNP (last 3 results) No results for input(s): "BNP" in the last 8760 hours.  ProBNP (last 3 results) No results for input(s): "PROBNP" in the last 8760 hours.  CBG: No results for input(s): "GLUCAP" in the last 168 hours.  Radiological Exams on Admission: CT CHEST ABDOMEN PELVIS WO CONTRAST  Result Date: 07/04/2022 CLINICAL DATA:  Sepsis, fever.  Decreased appetite and output. EXAM: CT CHEST, ABDOMEN AND PELVIS WITHOUT CONTRAST TECHNIQUE: Multidetector CT imaging of the chest, abdomen and pelvis was performed following the standard protocol without IV contrast. RADIATION DOSE REDUCTION: This exam was performed according to the departmental dose-optimization program which includes automated exposure control, adjustment of the mA and/or kV according to patient size and/or use of iterative reconstruction  technique. COMPARISON:  03/13/2022. FINDINGS: CT CHEST FINDINGS Cardiovascular: The heart is normal in size and there is a small pericardial effusion. Coronary artery calcification is noted. 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 The pulmonary trunk is normal in caliber. Mediastinum/Nodes: No mediastinal or axillary lymphadenopathy. Evaluation of the hila is limited due to lack of IV contrast. The thyroid gland, trachea, and esophagus are within normal limits. Lungs/Pleura: Consolidation is present in the right lower lobe. No effusion or pneumothorax. Musculoskeletal: Shoulder arthroplasty changes are noted on the left. Degenerative changes are noted in the thoracic spine and right glenohumeral joint. A coarse calcification is seen in the subclavian region on the left and likely chronic. CT ABDOMEN PELVIS FINDINGS Hepatobiliary: No focal liver abnormality is seen. No biliary ductal dilatation. The gallbladder is not seen. Pancreas: Unremarkable. No pancreatic ductal  dilatation or surrounding inflammatory changes. Spleen: Normal in size without focal abnormality. Adrenals/Urinary Tract: Adrenal glands are unremarkable. Kidneys are normal, without renal calculi, focal lesion, or hydronephrosis. Bladder is unremarkable. Stomach/Bowel: NG tube is noted in the stomach. Appendix appears normal. No evidence of bowel wall thickening, distention, or inflammatory changes. No free air or pneumatosis. Scattered diverticula along the colon without evidence of diverticulitis. A moderate amount of retained stool in the colon. Vascular/Lymphatic: Aortic atherosclerosis. No enlarged abdominal or pelvic lymph nodes. Reproductive: Prostate is unremarkable. Other: No abdominopelvic ascites. Musculoskeletal: Degenerative changes in the lumbar spine and bilateral hips. 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 A stable compression deformity is noted in the superior endplate at L1. No acute fracture. IMPRESSION: 1. Right lower lobe consolidation, concerning for pneumonia. 2. Moderate amount of retained stool in the colon suggesting constipation. 3. Aortic atherosclerosis. 4. Remaining chronic findings as described above. Electronically Signed   By: Brett Fairy M.D.   On: 07/04/2022 01:41   CT Head Wo Contrast  Result Date: 07/03/2022 CLINICAL DATA:  Mental status change EXAM: CT HEAD WITHOUT CONTRAST TECHNIQUE: Contiguous axial images were obtained from the base of the skull through the vertex without intravenous contrast. RADIATION DOSE REDUCTION: This exam was performed according to the departmental dose-optimization program which includes automated exposure control, adjustment of the mA and/or kV according to patient size and/or use of iterative reconstruction technique. COMPARISON:  03/13/2022 FINDINGS: Brain: No evidence of acute infarction, hemorrhage, mass, mass effect, or midline shift. No hydrocephalus or extra-axial fluid  collection. Age related cerebral atrophy. Vascular: No hyperdense vessel. Skull: Normal. Negative for fracture or focal lesion. Sinuses/Orbits: Sequela of chronic left maxillary sinusitis with osseous thickening and mild mucosal thickening. Near complete opacification of the right maxillary sinus. Mucosal thickening in the posterior left ethmoid air cells. Bubbly fluid and air-fluid level in the right sphenoid sinus. Right lamina papyracea defect. Other: The mastoid air cells are well aerated. IMPRESSION: 1. No acute intracranial process. 2. Findings suggestive of acute on chronic sinusitis. Electronically Signed   By: Merilyn Baba M.D.   On: 07/03/2022 23:26   DG Chest Port 1 View  Result Date: 07/03/2022 CLINICAL DATA:  Questionable sepsis - evaluate for abnormality EXAM: PORTABLE CHEST 1 VIEW COMPARISON:  Chest x-ray 04/26/2022 FINDINGS: The heart and mediastinal contours are unchanged. Aortic calcification. Bibasilar atelectasis. No focal consolidation. No pulmonary edema. No pleural effusion. No pneumothorax. No acute osseous abnormality. IMPRESSION: 1. No active disease. 2.  Aortic Atherosclerosis (ICD10-I70.0). Electronically Signed   By: Thomasena Edis  Tessie Fass M.D.   On: 07/03/2022 22:44    EKG: I independently viewed the EKG done and my findings are as followed: Sinus rhythm rate of 63.  Nonspecific ST-T changes.  QTc 378.  Assessment/Plan Present on Admission:  Severe sepsis (HCC)  Active Problems:   Severe sepsis (HCC)  Severe sepsis, possibly secondary to sacral decubitus ulcer, POA WBC 12.1, Tmax 103.5, unstageable sacral decubitus ulcer Started on broad-spectrum IV antibiotics, continue IV cefepime, IV vancomycin Gentle IV fluid hydration. Wound care specialist consulted for local wound care Monitor fever curve and WBC Follow cultures  Acute metabolic encephalopathy likely multifactorial secondary to sepsis, dehydration Treat underlying conditions Reorient as needed Fall and  aspiration precautions.  Chronic blood loss likely from sacral decubitus ulcer Hemoglobin down to 6.1 2 units PRBCs ordered to be transfused Repeat CBC post blood transfusion.  Right lower lobe pneumonia seen on CT scan with concern for aspiration, POA Continue empiric IV antibiotics cefepime and IV vancomycin Aspiration precautions  History of dysphagia status post PEG tube placement Strict n.p.o. Use PEG tube for feedings and medications  Chronic blood loss  GERD Currently on IV PPI twice daily  Dementia Resume home regimen   DVT prophylaxis: Subcu Lovenox daily  Code Status: Full code, per DSS, the guardian.  Family Communication: None at bedside  Disposition Plan: Admitted to stepdown unit  Consults called: None.  Admission status: Inpatient status.   Status is: Inpatient The patient requires at least 2 midnights for further evaluation and treatment of present condition.   Darlin Drop MD [facility] Pager [phone]  If 7PM-7AM, please contact night-coverage [email or web address] Password [email or web address]  07/04/2022, 3:24 AM

## 2022-07-04 NOTE — ED Notes (Addendum)
This RN spoke with Dub Mikes, SW at [facility] and updated her of pt's status. She states that they are pt's caregiver and POA. States the daughter has not went to court yet to have POA at this time. She is updated about pt's blood and verbally states its okay for pt to have this blood.

## 2022-07-04 NOTE — Progress Notes (Signed)
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 Patient arrived at the [facility] ED via EMS on 12.18 from [place] with Altered Mental status weakness, and fevers. EMS found patient to be hypotensive and was given IV fluid bolus. Evaluated in the ED, Hemoglobin was 6.1, given 2 units of blood, CT of chest showed RLL consolidation concerning for pneumonia. Was admitted on 12.19 for severe sepsis possibly secondary to sacral decubitus ulcer and started on IV antibiotics. Per Dr. Anne Fu, [facility] MD, this is a related hospital admission.    Visted Jack Rivas at bedside. No family present during my visit. IV fluids running to maintain blood pressure, patient is awake with eyes tracking but is non-verbal. Patient is currently receiving IV antibiotics. Report exchanged with ED physician and RN. Called DSS Guardian and left a message. When I spoke to the MD, he stated that both daughters were present at bedside and reported that they wanted the patient to be a DNR and comfort. MD tried reaching out to the Guardian as well and left a message. Spoke with palliative NP, who was able to contact guardian. She stated that when she spoke to the daughters earlier today and they not agreeing to DNR. Due to these interventions being futile, his guardian agreed to DNR but to continue other interventions at this time.      Patient is inpatient appropriate due to requiring IV antibiotics for treatment of pneumonia and sepsis secondary to sacral decubitus ulcer, and IV fluids for hypotension.    V/S: 97/55 bp, 97.7 Temp, 57 bpm, 14 RR, 100% on 3L/min  I/O: 4361.7/not recorded   Labs:  Glucose: 121 (H) BUN: 59 (H) Creatinine: 3.46 (H) Calcium: 8.4 (L) Albumin: 1.8 (L) AST: 63 (H) ALT: 46 (H) GFR, Estimated: 18 (L) WBC: 12.1 (H) RBC: 2.25 (L) Hemoglobin:  6.1 (LL) HCT: 21.1 (L) MCHC: 28.9 (L) RDW: 15.9 (H) NEUT#: 8.9 (H) Monocyte #: 1.2 (H) Prothrombin Time: 17.3 (H) INR: 1.4 (H)  Diagnostics:  CT of head  IMPRESSION: 1. No acute intracranial process. 2. Findings suggestive of acute on chronic sinusitis.  Chest x-ray  IMPRESSION: 1. No active disease. 2.  Aortic Atherosclerosis (ICD10-I70.0).  CT of chest/abdomen/pelvis  IMPRESSION: 1. Right lower lobe consolidation, concerning for pneumonia. 2. Moderate amount of retained stool in the colon suggesting constipation. 3. Aortic atherosclerosis. 4. Remaining chronic findings as described above.  IV/PRN: ceFEPIme (MAXIPIME) 2 g in sodium chloride 0.9 % 100 mL IVPB Dose: 2 g Freq:  Once Route: IV x1  lactated ringers bolus 1,000 mL Dose: 1,000 mL Freq:  Once Route: IV x1  lactated ringers bolus 1,000 mL Dose: 1,000 mL Freq:  Once Route: IV x1  lactated ringers bolus 1,000 mL Dose: 1,000 mL Freq:  Once Route: IV x1  lactated ringers infusion Rate: 75 mL/hr Freq: Continuous Route: IV x1    lactated ringers infusion Rate: 150 mL/hr Freq: Continuous Route: IV x1  metroNIDAZOLE (FLAGYL) IVPB 500 mg Dose: 500 mg Freq:  Once Route: IV x1  vancomycin (VANCOCIN) IVPB 1000 mg/200 mL premix Dose: 1,000 mg Freq:  Once Route: IV x1  pantoprazole (PROTONIX) injection 40 mg Dose: 40 mg Freq: Every 12 hours Route: IV x1   0.9 % sodium chloride infusion (Manually program via Guardrails IV Fluids) Freq:  Once Route: IV x1    Problem  list: Assessment/Plan Present on Admission:  Severe sepsis (HCC)   Active Problems:   Severe sepsis (HCC)   Severe sepsis, possibly secondary to sacral decubitus ulcer, POA WBC 12.1, Tmax 103.5, unstageable sacral decubitus ulcer Started on broad-spectrum IV antibiotics, continue IV cefepime, IV vancomycin Gentle IV fluid hydration. Wound care specialist consulted for local wound care Monitor fever curve and WBC Follow cultures    Acute metabolic encephalopathy likely multifactorial secondary to sepsis, dehydration Treat underlying conditions Reorient as needed Fall and aspiration precautions.   Chronic blood loss likely from sacral decubitus ulcer Hemoglobin down to 6.1 2 units PRBCs ordered to be transfused Repeat CBC post blood transfusion.   Right lower lobe pneumonia seen on CT scan with concern for aspiration, POA Continue empiric IV antibiotics cefepime and IV vancomycin Aspiration precautions   History of dysphagia status post PEG tube placement Strict n.p.o. Use PEG tube for feedings and medications   Chronic blood loss   GERD Currently on IV PPI twice daily   Dementia Resume home regimen  GOC: Patient is now a DNR.   D/C planning: Ongoing.    Family: Called patient's legal guardian with DSS, left a message.   IDT: Updated  Should patient need ambulance transfer - Please use [facility]) as they contract this service for our active hospice patients.   Lamount Cranker, RN [facility] Liaison  [phone]

## 2022-07-04 NOTE — Progress Notes (Addendum)
Patient seen and examined in the emergency room.  Nonverbal and critically sick looking.  Admitted by nighttime hospitalist early morning hours, see H&P and assessment plan done by Dr. Margo Aye.  In brief, 76 year old gentleman with history of severe malnutrition with dysphagia status post PEG tube, recurrent aspiration pneumonia, advanced dementia, CKD stage IIIb brought from nursing home with altered mental status, generalized weakness and fever.  Patient was admitted to hospital 2 months ago with similar symptoms and apparently sent to the nursing home with hospice care.  On presentation, temperature 103, blood pressure initially low responded to IV fluid resuscitation, hemoglobin was 6.1.  Lactic acid was 1.3. CT scan shows right lower lobe consolidation.  CT head no acute findings. Obviously infected stage IV decubitus ulcer.  Urinalysis was abnormal.  Admitted with severe sepsis possibly secondary to sacral decubitus ulcer, acute metabolic encephalopathy multifactorial, chronic blood loss anemia.  Severe sepsis, multifactorial Acute metabolic encephalopathy, multifactorial Chronic blood loss anemia Acute kidney injury on chronic kidney disease stage IIIb Advanced dementia, dysphagia status post PEG tube Frail  Failure to thrive in adults.  Plan: Pancultures pending.  Continue broad-spectrum antibiotics.  Continue supportive care with IV fluids.  Resume PEG tube feeding. Called and discussed with [facility] hospice team, they will follow-up.  Looks like there is a lot of parties involved in his care and currently not agreeable for end-of-life. Consult palliative care team to care and coordinate. Patient is very sick looking with extensive untreatable underlying conditions.  Appropriate for end-of-life care.  Futility of care in case of CPR: Patient with advanced dementia, very frail and debilitated, multiple untreatable medical issues.  In case of cardiac arrest or respiratory arrest,  attempting cardiopulmonary resuscitation if successful will be traumatic and will cause more physical and neurological damage. Will discuss with legal guardian to change him to DNR due to futility of care.  If guardian do not agree, will change to DNR status with agreement of another physician.   Total time spent: 35 minutes.  Same-day admit.  No charge visit.   Addendum:  Patient still remains in very poor clinical status.  Blood pressure stabilized but he is unresponsive.  He is on room air.  Maintaining airway. Revisited patient at the bedside.  2 of his daughters at the bedside.  Daughters were tearful with his father's condition.  Since there are patient's children, I gave them update especially patient's critical condition.  I told them our recommendation will be comfort care and hospice and daughters agree with it.  They have been asked me about [facility].  Updated hospice provider. Called DSS provider, unable to reach.  Waiting for callback. Will await till seen by another provider from palliative care team to change the CODE STATUS.  Medical recommendation will be inpatient hospice.

## 2022-07-04 NOTE — Progress Notes (Signed)
Pharmacy Antibiotic Note  Jack Rivas is a 76 y.o. male admitted on 07/03/2022 with  for fever.  Medical history includes dementia, CKD, atrial flutter, anemia.  He resides at [facility] nursing facility .  Pharmacy has been consulted to dose vancomycin and cefepime for sepsis.  1st doses given in ED  Plan: Vancomycin 1gm IV q36h (AUC 480.3, Scr 3.4) Cefepime 2gm IV q24h Follow renal function, cultures and clinical course  Height: 6\' 2"  (188 cm) Weight: 86.7 kg (191 lb 2.2 oz) IBW/kg (Calculated) : 82.2  Temp (24hrs), Avg:101.2 F (38.4 C), Min:98.8 F (37.1 C), Max:103.5 F (39.7 C)  Recent Labs  Lab 07/03/22 2315 07/03/22 2331 07/04/22 0033  WBC 12.1*  --   --   CREATININE 3.46* 3.40*  --   LATICACIDVEN 1.3  --  1.3    Estimated Creatinine Clearance: 21.5 mL/min (A) (by C-G formula based on SCr of 3.4 mg/dL (H)).    No Known Allergies  Antimicrobials this admission: 12/19 vanc >> 12/19 cefepime >> 12/19 flagyl x 1  Dose adjustments this admission:   Microbiology results: 12/18 BCx:  12/18 UCx:   Thank you for allowing pharmacy to be a part of this patient's care.  1/19 RPh 07/04/2022, 1:54 AM

## 2022-07-04 NOTE — Consult Note (Signed)
W[facility] Nurse Consult Note: Reason for Consult: pressure injury Patient from SNF, large pressure injury, CT negative, was on Hospice per notes at SNF, however now full code inpatient. Wound type: Unstageable Pressure Injury Pressure Injury POA: Yes Measurement: see nursing flow sheets Wound bed:80% non viable tissue/20% pink, non granular along the proximal aspect of the wound bed  Drainage (amount, consistency, odor) drainage appears serosanguinous on dressing Periwound: distal aspect of wound has skin necrosis, would anticipate this will be a much larger wound if aggressive care is desired  Dressing procedure/placement/frequency: Cleanse wound with saline, dry Lightly dampen kerlix gauze with 1/4% Dakin's solution and pack wound into deepest portions and undermining at 12 o'clock Top with ABD pad, secure with tape.    Dakin's is a chemical debridement agent that will assist with odor control, can be used short term.   If aggressive care desired, would need general surgery consult. Doubtful in this setting and patients current status this would will heal.   Added low air loss mattress for moisture management and pressure redistribution Added Prevalon boots for offloading heels in high risk patient Attending to add RD if aggressive supplementation for wound healing needed post debridement Suggests GOC meeting  Discussed POC with hospitalist and bedside nurse via [facility] Re consult if needed, will not follow at this time. Thanks  Normand Damron [employer], RN,CWOCN, CNS, CWON-AP [phone])

## 2022-07-04 NOTE — Consult Note (Signed)
Consultation Note Date: 07/04/2022   Patient Name: Jack Rivas  DOB: 09-10-1945  MRN: [id number]  Age / Sex: 76 y.o., male  PCP: [name], MD Referring Physician: Barb Merino, MD  Reason for Consultation: Establishing goals of care  HPI/Patient Profile: 76 y.o. male  with past medical history of dementia, HTN, PEG placement, aspiration pneumonia, CKD stage 3b, unstageable sacral decubitus ulcer, on hospice care admitted on 07/03/2022 with altered mental status, generalized weakness, fever realted to severe sepsis likely secondary to RLL pneumonia vs sacral decubitus ulcer.   Clinical Assessment and Goals of Care: Extensive chart reviewed. Complicated situation. Multiple previous palliative conversations with many of my colleagues over the past year with recommendations for consideration of DNR with poor prognosis due to progressing dementia. Noting that he has been DNR at times previously but any fluctuations in code status requests full code vs DNR. I spoke with hospice liaison who reports that [place] guardian is now involved and does not consent to DNR due to family disagreeing with DNR status. He has remained full code with hospice support.   Dr. Sloan Leiter notes that he spoke with 2 daughters at bedside who agree with DNR and even agree to consideration of transition to [facility]. Daughters are not present for conversation at time of my visit and no contact information for them available - although all contact and decisions should go through [place] legal guardian. After reviewing records and conversations with attending, hospice, and my attending all agree that it would be futile and harmful to perform resuscitative measures on [name]. Dr. Sloan Leiter and Dr. Hilma Favors are supporting DNR at this time with consent from 2 daughters.   I was able to reach and speak with DSS legal guardian Barbarann Ehlers  [phone]). Ms. Carley Hammed informed me that her conversation with daughters earlier today was that they were NOT supportive of DNR. I explained that the attending has since had a different conversation. I informed Ms. Woolard that at this time the medical team will be making [name] DNR status based on his best interest and well-being as resuscitative measures would cause harm and suffering to him and are considered futile care. We will be continuing supportive care interventions at this time. Ms. Carley Hammed verbalizes understanding and requesting documentation to have on file with this recommendation.   All questions/concerns addressed to the best of my ability. Emotional support provided to patient.   Primary Decision Maker LEGAL Art Buff [phone])    SUMMARY OF RECOMMENDATIONS   - DNR per Dr. Sloan Leiter and Dr. Hilma Favors with permission from 2 daughters to Dr. Sloan Leiter (and futility of resuscitative measures) - Continue supportive care at this time - Further [place] with DSS, family, hospice to determine comfort and [facility] eligibility   Code Status/Advance Care Planning: DNR   Symptom Management:  Per attending  Prognosis:  Overall prognosis poor. Days likely if transitioned to full comfort.   Discharge Planning: To Be Determined      Primary Diagnoses: Present on Admission:  Severe  sepsis (HCC)   I have reviewed the medical record, interviewed the patient and family, and examined the patient. The following aspects are pertinent.  Past Medical History:  Diagnosis Date   Aspiration pneumonia (HCC) 03/20/2021   Dementia (HCC)    HCAP (healthcare-associated pneumonia) 04/20/2021   Hypertension    Hypoglycemia 10/25/2021   Severe sepsis (HCC) 04/20/2021   Uses feeding tube    Social History   Socioeconomic History   Marital status: Single    Spouse name: Not on file   Number of children: Not on file   Years of education: Not on file   Highest education  level: Not on file  Occupational History   Not on file  Tobacco Use   Smoking status: Light Smoker   Smokeless tobacco: Never  Vaping Use   Vaping Use: Unknown  Substance and Sexual Activity   Alcohol use: Not Currently   Drug use: Never   Sexual activity: Not Currently  Other Topics Concern   Not on file  Social History Narrative   Not on file   Social Determinants of Health   Financial Resource Strain: Not on file  Food Insecurity: Not on file  Transportation Needs: Not on file  Physical Activity: Not on file  Stress: Not on file  Social Connections: Not on file   No family history on file. Scheduled Meds:  Chlorhexidine Gluconate Cloth  6 each Topical Daily   mouth rinse  15 mL Mouth Rinse 4 times per day   pantoprazole (PROTONIX) IV  40 mg Intravenous Q12H   sodium hypochlorite   Irrigation Daily   Continuous Infusions:  [START ON 07/05/2022] ceFEPime (MAXIPIME) IV     lactated ringers 75 mL/hr at 07/04/22 0346   vancomycin     PRN Meds:.mouth rinse No Known Allergies Review of Systems  Unable to perform ROS: Dementia    Physical Exam Vitals and nursing note reviewed.  Constitutional:      General: He is not in acute distress.    Appearance: He is cachectic. He is ill-appearing.     Comments: Severe muscle wasting  Cardiovascular:     Rate and Rhythm: Normal rate.  Pulmonary:     Comments: Shallow Abdominal:     General: Abdomen is flat.  Neurological:     Mental Status: He is alert.     Comments: Non-verbal; does not follow commands     Vital Signs: BP 98/61   Pulse 62   Temp (!) 96.5 F (35.8 C) (Rectal) Comment: warm blankets applied  Resp 15   Ht 6\' 2"  (1.88 m)   Wt 79.5 kg   SpO2 100%   BMI 22.50 kg/m  Pain Scale: PAINAD       SpO2: SpO2: 100 % O2 Device:SpO2: 100 % O2 Flow Rate: .O2 Flow Rate (L/min): 3 L/min  IO: Intake/output summary:  Intake/Output Summary (Last 24 hours) at 07/04/2022 1438 Last data filed at 07/04/2022  0743 Gross per 24 hour  Intake 4729.67 ml  Output --  Net 4729.67 ml    LBM:   Baseline Weight: Weight: 86.7 kg Most recent weight: Weight: 79.5 kg     Palliative Assessment/Data: 10%     Time In: 1350  Time Total: 75 min  Greater than 50%  of this time was spent counseling and coordinating care related to the above assessment and plan.  Signed by: 07/06/2022, NP Palliative Medicine Team Pager # [phone] (M-F 8a-5p) Team Phone # [phone] (Nights/Weekends)

## 2022-07-05 DIAGNOSIS — R6520 Severe sepsis without septic shock: Secondary | ICD-10-CM | POA: Diagnosis not present

## 2022-07-05 DIAGNOSIS — E43 Unspecified severe protein-calorie malnutrition: Secondary | ICD-10-CM | POA: Insufficient documentation

## 2022-07-05 DIAGNOSIS — A419 Sepsis, unspecified organism: Secondary | ICD-10-CM | POA: Diagnosis not present

## 2022-07-05 LAB — CBC WITH DIFFERENTIAL/PLATELET
Abs Immature Granulocytes: 0.03 10*3/uL (ref 0.00–0.07)
Basophils Absolute: 0 10*3/uL (ref 0.0–0.1)
Basophils Relative: 0 %
Eosinophils Absolute: 0.1 10*3/uL (ref 0.0–0.5)
Eosinophils Relative: 1 %
HCT: 25 % — ABNORMAL LOW (ref 39.0–52.0)
Hemoglobin: 7.4 g/dL — ABNORMAL LOW (ref 13.0–17.0)
Immature Granulocytes: 0 %
Lymphocytes Relative: 12 %
Lymphs Abs: 1 10*3/uL (ref 0.7–4.0)
MCH: 27.9 pg (ref 26.0–34.0)
MCHC: 29.6 g/dL — ABNORMAL LOW (ref 30.0–36.0)
MCV: 94.3 fL (ref 80.0–100.0)
Monocytes Absolute: 0.6 10*3/uL (ref 0.1–1.0)
Monocytes Relative: 7 %
Neutro Abs: 6.7 10*3/uL (ref 1.7–7.7)
Neutrophils Relative %: 80 %
Platelets: 265 10*3/uL (ref 150–400)
RBC: 2.65 MIL/uL — ABNORMAL LOW (ref 4.22–5.81)
RDW: 16.3 % — ABNORMAL HIGH (ref 11.5–15.5)
WBC: 8.5 10*3/uL (ref 4.0–10.5)
nRBC: 0 % (ref 0.0–0.2)

## 2022-07-05 LAB — BPAM RBC
Blood Product Expiration Date: 202401162359
Blood Product Expiration Date: 202401162359
ISSUE DATE / TIME: 202312190210
ISSUE DATE / TIME: 202312190504
Unit Type and Rh: 6200
Unit Type and Rh: 6200

## 2022-07-05 LAB — COMPREHENSIVE METABOLIC PANEL
ALT: 31 U/L (ref 0–44)
AST: 27 U/L (ref 15–41)
Albumin: 1.7 g/dL — ABNORMAL LOW (ref 3.5–5.0)
Alkaline Phosphatase: 42 U/L (ref 38–126)
Anion gap: 9 (ref 5–15)
BUN: 53 mg/dL — ABNORMAL HIGH (ref 8–23)
CO2: 24 mmol/L (ref 22–32)
Calcium: 8 mg/dL — ABNORMAL LOW (ref 8.9–10.3)
Chloride: 112 mmol/L — ABNORMAL HIGH (ref 98–111)
Creatinine, Ser: 2.13 mg/dL — ABNORMAL HIGH (ref 0.61–1.24)
GFR, Estimated: 31 mL/min — ABNORMAL LOW (ref >60)
Glucose, Bld: 85 mg/dL (ref 70–99)
Potassium: 3.9 mmol/L (ref 3.5–5.1)
Sodium: 145 mmol/L (ref 135–145)
Total Bilirubin: 0.7 mg/dL (ref 0.3–1.2)
Total Protein: 6.5 g/dL (ref 6.5–8.1)

## 2022-07-05 LAB — TYPE AND SCREEN
ABO/RH(D): A POS
Antibody Screen: NEGATIVE
Unit division: 0
Unit division: 0

## 2022-07-05 LAB — MAGNESIUM: Magnesium: 2 mg/dL (ref 1.7–2.4)

## 2022-07-05 LAB — PHOSPHORUS: Phosphorus: 3.8 mg/dL (ref 2.5–4.6)

## 2022-07-05 MED ORDER — SODIUM CHLORIDE 0.9 % IV SOLN
2.0000 g | Freq: Two times a day (BID) | INTRAVENOUS | Status: DC
  Administered 2022-07-05 – 2022-07-06 (×3): 2 g via INTRAVENOUS
  Filled 2022-07-05 (×3): qty 12.5

## 2022-07-05 NOTE — Progress Notes (Signed)
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 Patient arrived at the [facility] ED via EMS on 12.18 from [place] with Altered Mental status weakness, and fevers. EMS found patient to be hypotensive and was given IV fluid bolus. Evaluated in the ED, Hemoglobin was 6.1, given 2 units of blood, CT of chest showed RLL consolidation concerning for pneumonia. Was admitted on 12.19 for severe sepsis possibly secondary to sacral decubitus ulcer and started on IV antibiotics. Per Dr. Anne Fu, [facility] MD, this is a related hospital admission.    Visted Jack Rivas at bedside. Patient has been moved to room 1231. No family present during my visit. IV fluids running to maintain, patient is awake with eyes open, not tracking or responding to touch or voice. Patient is currently receiving IV antibiotics. Report exchanged with RN. Called DSS Guardian and left a message. Patient is now a DMR, still doing limited interventions.   Patient is inpatient appropriate due to requiring IV antibiotics for treatment of pneumonia and sepsis secondary to sacral decubitus ulcer, and IV fluids for hypotension.    V/S: 88/46 bp, 97.5 Temp, 65 bpm, 14 RR, 100% on 3L/min  I/O: 1584/1700  Labs:  Chloride: 112 (H) BUN: 53 (H) Creatinine: 2.13 (H) Calcium: 8.0 (L) Albumin: 1.7 (L) GFR, Estimated: 31 (L) RBC: 2.65 (L) Hemoglobin: 7.4 (L) HCT: 25.0 (L) MCHC: 29.6 (L) RDW: 16.3 (H)  Diagnostics:  None today.  IV/PRN: ceFEPIme (MAXIPIME) 2 g in sodium chloride 0.9 % 100 mL IVPB Dose: 2 g Freq: Every 24 hours Route: IV x1   lactated ringers infusion Rate: 75 mL/hr Freq: Continuous Route: IV x5  pantoprazole (PROTONIX) injection 40 mg Dose: 40 mg Freq: Every 12 hours Route: IV x3  Problem list: Assessment & Plan:   Severe sepsis, multifactorial Acute  metabolic encephalopathy, multifactorial Chronic blood loss anemia Acute kidney injury on chronic kidney disease stage IIIb Advanced dementia, dysphagia status post PEG tube Failure to thrive.  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   Blood cultures negative.  Urine culture with more than 1000 colonies of gram-negative rods.  Severity of condition, will continue broad-spectrum antibiotic therapy.  Continue IV fluids. Globally encephalopathic and no meaningful interaction. Patient received 2 units of PRBC transfusion with appropriate response, no active bleeding. Renal functions trending down to his baseline from acute kidney injury. Infected and tracking sacral decubitus ulcer, patient would not tolerate any surgery.   GOC: Patient is now a DNR, limited interventions.   D/C planning: Ongoing.    Family: Called patient's legal guardian with DSS, left a message.   IDT: Updated  Should patient need ambulance transfer - Please use [facility]) as they contract this service for our active hospice patients.   Lamount Cranker, RN [facility] Liaison  [phone]

## 2022-07-05 NOTE — Progress Notes (Signed)
Initial Nutrition Assessment  DOCUMENTATION CODES:   Severe malnutrition in context of chronic illness  INTERVENTION:  - Per MD, plan to hold off on starting tube feeds at this time.   - If plan to start tube feeds at later time, would recommend below TF regimen: Jevity 1.5 at 55 ml/h (1320 ml per day) Prosource TF20 60 ml once daily *Would recommend starting at 41mL/hr and advancing by 69mL Q12H  Provides 2060 kcal, 104 gm protein, 1003 ml free water daily  - Monitor magnesium, potassium, and phosphorus BID for at least 3 days, MD to replete as needed, as pt is at risk for refeeding syndrome given severe malnutrition.  - Recommend 100mg  thiamine x5 days if starting tube feeds to due to refeeding risk. - Monitor weight trends.    NUTRITION DIAGNOSIS:   Severe Malnutrition related to chronic illness as evidenced by severe fat depletion, severe muscle depletion, percent weight loss (8% in 2 months).  GOAL:   Patient will meet greater than or equal to 90% of their needs  MONITOR:   Diet advancement, Labs, Weight trends  REASON FOR ASSESSMENT:   Consult Enteral/tube feeding initiation and management  ASSESSMENT:   76 y.o. male with PMH significant for severe malnutrition with dysphagia s/p PEG tube placement, recurrent aspiration pneumonia, worsening dementia on hospice, CKD 3B, who presented from SNF due to AMS, generalized weakness and subjective fevers.   Patient laying in bed at time of visit. Did not respond to any questions. No family or visitors at bedside.  Per chart review, patient has had a 16# or 8% weight loss in the past 2 months, significant for the time frame.  An RD note from last admission in October reports that patient was not using his PEG and only eating modified diet for comfort feeds. Unable to determine if patient has since used his PEG at SNF.  Per discussion with MD, plan to hold off on starting tube feeds at this time as comfort care is being  discussed. TF recs provided above in event patient does not transition to comfort care. At previous admission patient had been on Jevity 1.5, will utilize this formula for TF recommendations.   Medications reviewed and include: Vancomycin  Labs reviewed:  Creatinine 2.13   NUTRITION - FOCUSED PHYSICAL EXAM:  Flowsheet Row Most Recent Value  Orbital Region Severe depletion  Upper Arm Region Severe depletion  Thoracic and Lumbar Region Severe depletion  Buccal Region Severe depletion  Temple Region Severe depletion  Clavicle Bone Region Severe depletion  Clavicle and Acromion Bone Region Severe depletion  Scapular Bone Region Unable to assess  Dorsal Hand Severe depletion  Patellar Region Severe depletion  Anterior Thigh Region Severe depletion  Posterior Calf Region Severe depletion  Edema (RD Assessment) None  Hair Reviewed  Eyes Reviewed  Mouth Reviewed  Skin Reviewed  Nails Reviewed       Diet Order:   Diet Order             Diet NPO time specified  Diet effective now                   EDUCATION NEEDS:  Not appropriate for education at this time  Skin:  Skin Assessment: Skin Integrity Issues: Skin Integrity Issues:: Unstageable, Stage I Stage I: L heel, R heel, Toes Unstageable: Mid Sacrum  Last BM:  PTA  Height:  Ht Readings from Last 1 Encounters:  07/04/22 6\' 2"  (1.88 m)   Weight:  Wt Readings from Last 1 Encounters:  07/04/22 79.5 kg   BMI:  Body mass index is 22.5 kg/m.  Estimated Nutritional Needs:  Kcal:  2000-2250 kcals Protein:  100-125 grams Fluid:  >/= 2L    Shelle Iron RD, LDN For contact information, refer to [facility].

## 2022-07-05 NOTE — Progress Notes (Signed)
PHARMACY NOTE:  ANTIMICROBIAL RENAL DOSAGE ADJUSTMENT  Current antimicrobial regimen includes a mismatch between antimicrobial dosage and estimated renal function.  As per policy approved by the Pharmacy & Therapeutics and Medical Executive Committees, the antimicrobial dosage will be adjusted accordingly.  Current antimicrobial dosage:   Cefepime 2 g IV q24h   Indication: unknown source  Renal Function:  Estimated Creatinine Clearance: 33.2 mL/min (A) (by C-G formula based on SCr of 2.13 mg/dL (H)). []      On intermittent HD, scheduled: []      On CRRT    Antimicrobial dosage has been changed to:   Cefepime 2 g IV every 12 hours  Additional comments:   Thank you for allowing pharmacy to be a part of this patient's care.  , [facility] 07/05/2022 10:40 AM

## 2022-07-05 NOTE — Progress Notes (Signed)
Noted sacral dsg changed completed. Noted very large , tunneled wound with dark unhealing tissue on its center and un-approximated edges black tissue. Noted no foul odor but uneven craters noted in the depth of the wound. Site cleansed and W-D DRESSING COMPLETED

## 2022-07-05 NOTE — Progress Notes (Signed)
PROGRESS NOTE    Jack Rivas  WUJ:[id number] DOB: 11/16/1945 DOA: 07/03/2022 PCP: Cyndia Skeeters, MD    Brief Narrative:  76 year old gentleman with history of severe malnutrition with dysphagia status post PEG tube, recurrent aspiration pneumonia, advanced dementia, CKD stage IIIb brought from nursing home with altered mental status, generalized weakness and fever.  Patient was admitted to hospital 2 months ago with similar symptoms and apparently sent to the nursing home with hospice care.  On presentation, temperature 103, blood pressure initially low responded to IV fluid resuscitation, hemoglobin was 6.1.  Lactic acid was 1.3. CT scan shows right lower lobe consolidation.  CT head no acute findings. Obviously infected stage IV decubitus ulcer.  Urinalysis was abnormal.  Admitted with severe sepsis possibly secondary to sacral decubitus ulcer, acute metabolic encephalopathy multifactorial, chronic blood loss anemia.   Assessment & Plan:   Severe sepsis, multifactorial Acute metabolic encephalopathy, multifactorial Chronic blood loss anemia Acute kidney injury on chronic kidney disease stage IIIb Advanced dementia, dysphagia status post PEG tube Failure to thrive.  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  Blood cultures negative.  Urine culture with more than 1000 colonies of gram-negative rods.  Severity of condition, will continue broad-spectrum antibiotic therapy.  Continue IV fluids. Globally encephalopathic and no meaningful interaction. Patient received 2 units of PRBC transfusion with appropriate response, no active bleeding. Renal functions trending down to his baseline from acute kidney injury. Infected and tracking sacral decubitus ulcer, patient would not tolerate any surgery.  Goal of care: Given above conditions and futility of care, medically recommended DNR and patient remains DNR. Updated patient's daughter at the bedside 12/19.  Updated  DSS. Palliative care involved. Will continue discussions to coordinate care between family and DSS and recommend comfort care and hospice.      DVT prophylaxis: Place and maintain sequential compression device Start: 07/04/22 1535   Code Status: DNR/DNI Family Communication: none at the bedside.  Expecting families. Disposition Plan: Status is: Inpatient Remains inpatient appropriate because: Critically ill     Consultants:  Palliative team  Procedures:  None  Antimicrobials:  Vancomycin and cefepime 12/18-    Subjective: Patient seen and examined.  Remains unresponsive.  No other overnight events.  On room air.  Looks comfortable without any distress.  Blood pressures are adequate.  Objective: Vitals:   07/05/22 0600 07/05/22 0700 07/05/22 0800 07/05/22 0900  BP: (!) 90/48 (!) 96/51 (!) 104/59 (!) 88/46  Pulse: 68 66 66 65  Resp: Temp:   (!) 97.5 F (36.4 C)   TempSrc:   Axillary   SpO2: 100% 100% 100% 100%  Weight:      Height:        Intake/Output Summary (Last 24 hours) at 07/05/2022 1133 Last data filed at 07/05/2022 0925 Gross per 24 hour  Intake 2275.66 ml  Output 1700 ml  Net 575.66 ml   Filed Weights   07/03/22 2200 07/03/22 2246 07/04/22 1400  Weight: 86.7 kg 86.7 kg 79.5 kg    Examination:  General: Frail and debilitated.  Unresponsive.  Intermittently visual tracking as per nursing staff. Mouth is open with dried crusting. Cardiovascular: S1-S2 normal.  Tachycardic. Respiratory: Poor bilateral air entry.  Not in distress.  On room air. Gastrointestinal: Soft.  PEG tube in place.  Bowel sound present. Ext: Cachectic extremities.  Does not follow commands.  No swelling or edema. Neuro: Does not follow any commands.  No spontaneous movements.  Looks quite  uncomfortable. Skin: Stage IV decubitus ulcer in the sacrum, no active secretions.    Data Reviewed: I have personally reviewed following labs and imaging  studies  CBC: Recent Labs  Lab 07/03/22 2315 07/03/22 2331 07/04/22 0426 07/04/22 1510 07/05/22 0259  WBC 12.1*  --  10.8*  --  8.5  NEUTROABS 8.9*  --   --   --  6.7  HGB 6.1* 7.1* 5.7* 7.1* 7.4*  HCT 21.1* 21.0* 18.9* 24.0* 25.0*  MCV 93.8  --  92.6  --  94.3  PLT 290  --  246  --  265   Basic Metabolic Panel: Recent Labs  Lab 07/03/22 2315 07/03/22 2331 07/04/22 0426 07/05/22 0259  NA 143 144  --  145  K 4.7 5.0  --  3.9  CL 108 108  --  112*  CO2 25  --   --  24  GLUCOSE 121* 117*  --  85  BUN 59* 62*  --  53*  CREATININE 3.46* 3.40* 2.54* 2.13*  CALCIUM 8.4*  --   --  8.0*  MG 2.2  --   --  2.0  PHOS  --   --   --  3.8   GFR: Estimated Creatinine Clearance: 33.2 mL/min (A) (by C-G formula based on SCr of 2.13 mg/dL (H)). Liver Function Tests: Recent Labs  Lab 07/03/22 2315 07/05/22 0259  AST 63* 27  ALT 46* 31  ALKPHOS 52 42  BILITOT 0.6 0.7  PROT 7.9 6.5  ALBUMIN 1.8* 1.7*   No results for input(s): "LIPASE", "AMYLASE" in the last 168 hours. No results for input(s): "AMMONIA" in the last 168 hours. Coagulation Profile: Recent Labs  Lab 07/03/22 2315  INR 1.4*   Cardiac Enzymes: No results for input(s): "CKTOTAL", "CKMB", "CKMBINDEX", "TROPONINI" in the last 168 hours. BNP (last 3 results) No results for input(s): "PROBNP" in the last 8760 hours. HbA1C: No results for input(s): "HGBA1C" in the last 72 hours. CBG: Recent Labs  Lab 07/04/22 1613  GLUCAP 97   Lipid Profile: No results for input(s): "CHOL", "HDL", "LDLCALC", "TRIG", "CHOLHDL", "LDLDIRECT" in the last 72 hours. Thyroid Function Tests: No results for input(s): "TSH", "T4TOTAL", "FREET4", "T3FREE", "THYROIDAB" in the last 72 hours. Anemia Panel: No results for input(s): "VITAMINB12", "FOLATE", "FERRITIN", "TIBC", "IRON", "RETICCTPCT" in the last 72 hours. Sepsis Labs: Recent Labs  Lab 07/03/22 2315 07/04/22 0033  LATICACIDVEN 1.3 1.3    Recent Results (from the past 240  hour(s))  Resp panel by RT-PCR (RSV, Flu A&B, Covid) Anterior Nasal Swab     Status: None   Collection Time: 07/03/22 10:22 PM   Specimen: Anterior Nasal Swab  Result Value Ref Range Status   SARS Coronavirus 2 by RT PCR NEGATIVE NEGATIVE Final    Comment: (NOTE) SARS-CoV-2 target nucleic acids are NOT DETECTED.  The SARS-CoV-2 RNA is generally detectable in upper respiratory specimens during the acute phase of infection. The lowest concentration of SARS-CoV-2 viral copies this assay can detect is 138 copies/mL. A negative result does not preclude SARS-Cov-2 infection and should not be used as the sole basis for treatment or other patient management decisions. A negative result may occur with  improper specimen collection/handling, submission of specimen other than nasopharyngeal swab, presence of viral mutation(s) within the areas targeted by this assay, and inadequate number of viral copies(<138 copies/mL). A negative result must be combined with clinical observations, patient history, and epidemiological information. The expected result is Negative.  Fact Sheet for Patients:  BloggerCourse.comhttps://www.fda.gov/media/152166/download  Fact Sheet for Healthcare Providers:  SeriousBroker.ithttps://www.fda.gov/media/152162/download  This test is no t yet approved or cleared by the [place] FDA and  has been authorized for detection and/or diagnosis of SARS-CoV-2 by FDA under an Emergency Use Authorization (EUA). This EUA will remain  in effect (meaning this test can be used) for the duration of the COVID-19 declaration under Section 564(b)(1) of the Act, 21 U.S.C.section 360bbb-3(b)(1), unless the authorization is terminated  or revoked sooner.       Influenza A by PCR NEGATIVE NEGATIVE Final   Influenza B by PCR NEGATIVE NEGATIVE Final    Comment: (NOTE) The Xpert Xpress SARS-CoV-2/FLU/RSV plus assay is intended as an aid in the diagnosis of influenza from Nasopharyngeal swab specimens and should not  be used as a sole basis for treatment. Nasal washings and aspirates are unacceptable for Xpert Xpress SARS-CoV-2/FLU/RSV testing.  Fact Sheet for Patients: BloggerCourse.comhttps://www.fda.gov/media/152166/download  Fact Sheet for Healthcare Providers: SeriousBroker.ithttps://www.fda.gov/media/152162/download  This test is not yet approved or cleared by the [place] FDA and has been authorized for detection and/or diagnosis of SARS-CoV-2 by FDA under an Emergency Use Authorization (EUA). This EUA will remain in effect (meaning this test can be used) for the duration of the COVID-19 declaration under Section 564(b)(1) of the Act, 21 U.S.C. section 360bbb-3(b)(1), unless the authorization is terminated or revoked.     Resp Syncytial Virus by PCR NEGATIVE NEGATIVE Final    Comment: (NOTE) Fact Sheet for Patients: BloggerCourse.comhttps://www.fda.gov/media/152166/download  Fact Sheet for Healthcare Providers: SeriousBroker.ithttps://www.fda.gov/media/152162/download  This test is not yet approved or cleared by the [place] FDA and has been authorized for detection and/or diagnosis of SARS-CoV-2 by FDA under an Emergency Use Authorization (EUA). This EUA will remain in effect (meaning this test can be used) for the duration of the COVID-19 declaration under Section 564(b)(1) of the Act, 21 U.S.C. section 360bbb-3(b)(1), unless the authorization is terminated or revoked.  Performed at [facility], [address] [place], [place] [address]   Blood Culture (routine x 2)     Status: None (Preliminary result)   Collection Time: 07/03/22 11:15 PM   Specimen: BLOOD  Result Value Ref Range Status   Specimen Description   Final    BLOOD SITE NOT SPECIFIED Performed at [facility], [address] [place], [place] [address]    Special Requests   Final    BOTTLES DRAWN AEROBIC AND ANAEROBIC Blood Culture results may not be optimal due to an inadequate volume of blood received in culture  bottles Performed at [facility], [address] [place], [place] [address]    Culture   Final    NO GROWTH 1 DAY Performed at [facility], [address] [place], [place] [address]    Report Status PENDING  Incomplete  Blood Culture (routine x 2)     Status: None (Preliminary result)   Collection Time: 07/04/22 12:33 AM   Specimen: BLOOD  Result Value Ref Range Status   Specimen Description   Final    BLOOD BLOOD LEFT HAND Performed at [facility], [address] [place], [place] [address]    Special Requests   Final    BOTTLES DRAWN AEROBIC AND ANAEROBIC Blood Culture adequate volume Performed at [facility], [address] [place], [place] [address]    Culture   Final    NO GROWTH 1 DAY Performed at [facility], [address]  [place], [place] [address]    Report Status PENDING  Incomplete  Urine Culture     Status: Abnormal (Preliminary result)   Collection Time: 07/04/22  3:33 AM   Specimen: In/Out Cath Urine  Result Value Ref Range Status   Specimen Description   Final    IN/OUT CATH URINE Performed at [facility], [address], [place], [place] [address]    Special Requests   Final    NONE Performed at [facility], [address], [place], [place] [address]    Culture (A)  Final    >=100,000 COLONIES/mL GRAM NEGATIVE RODS SUSCEPTIBILITIES TO FOLLOW Performed at [facility], [address], [place], [place] [address]    Report Status PENDING  Incomplete  MRSA Next Gen by PCR, Nasal     Status: Abnormal   Collection Time: 07/04/22  2:06 PM   Specimen: Nasal Mucosa; Nasal Swab  Result Value Ref Range Status   MRSA by PCR Next Gen DETECTED (A) NOT DETECTED Final    Comment: CRITICAL RESULT CALLED TO, READ BACK BY AND VERIFIED WITH: BURDICK,J AT 1704 ON 07/04/22 BY LUZOLOP (NOTE) The GeneXpert MRSA Assay (FDA approved for NASAL specimens  only), is one component of a comprehensive MRSA colonization surveillance program. It is not intended to diagnose MRSA infection nor to guide or monitor treatment for MRSA infections. Test performance is not FDA approved in patients less than 27 years old. Performed at [facility], [address], [place], [place] [address]          Radiology Studies: CT CHEST ABDOMEN PELVIS WO CONTRAST  Result Date: 07/04/2022 CLINICAL DATA:  Sepsis, fever.  Decreased appetite and output. EXAM: CT CHEST, ABDOMEN AND PELVIS WITHOUT CONTRAST TECHNIQUE: Multidetector CT imaging of the chest, abdomen and pelvis was performed following the standard protocol without IV contrast. RADIATION DOSE REDUCTION: This exam was performed according to the departmental dose-optimization program which includes automated exposure control, adjustment of the mA and/or kV according to patient size and/or use of iterative reconstruction technique. COMPARISON:  03/13/2022. FINDINGS: CT CHEST FINDINGS Cardiovascular: The heart is normal in size and there is a small pericardial effusion. Coronary artery calcification is noted. 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 The pulmonary trunk is normal in caliber. Mediastinum/Nodes: No mediastinal or axillary lymphadenopathy. Evaluation of the hila is limited due to lack of IV contrast. The thyroid gland, trachea, and esophagus are within normal limits. Lungs/Pleura: Consolidation is present in the right lower lobe. No effusion or pneumothorax. Musculoskeletal: Shoulder arthroplasty changes are noted on the left. Degenerative changes are noted in the thoracic spine and right glenohumeral joint. A coarse calcification is seen in the subclavian region on the left and likely chronic. CT ABDOMEN PELVIS FINDINGS Hepatobiliary: No focal liver abnormality is seen. No biliary ductal dilatation. The  gallbladder is not seen. Pancreas: Unremarkable. No pancreatic ductal dilatation or surrounding inflammatory changes. Spleen: Normal in size without focal abnormality. Adrenals/Urinary Tract: Adrenal glands are unremarkable. Kidneys are normal, without renal calculi, focal lesion, or hydronephrosis. Bladder is unremarkable. Stomach/Bowel: NG tube is noted in the stomach. Appendix appears normal. No evidence of bowel wall thickening, distention, or inflammatory changes. No free air or pneumatosis. Scattered diverticula along the colon without evidence of diverticulitis. A moderate amount of retained stool in the colon. Vascular/Lymphatic: Aortic atherosclerosis. No enlarged abdominal or pelvic lymph nodes. Reproductive: Prostate is unremarkable. Other: No  abdominopelvic ascites. Musculoskeletal: Degenerative changes in the lumbar spine and bilateral hips. 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 A stable compression deformity is noted in the superior endplate at L1. No acute fracture. IMPRESSION: 1. Right lower lobe consolidation, concerning for pneumonia. 2. Moderate amount of retained stool in the colon suggesting constipation. 3. Aortic atherosclerosis. 4. Remaining chronic findings as described above. Electronically Signed   By: Thornell Sartorius M.D.   On: 07/04/2022 01:41   CT Head Wo Contrast  Result Date: 07/03/2022 CLINICAL DATA:  Mental status change EXAM: CT HEAD WITHOUT CONTRAST TECHNIQUE: Contiguous axial images were obtained from the base of the skull through the vertex without intravenous contrast. RADIATION DOSE REDUCTION: This exam was performed according to the departmental dose-optimization program which includes automated exposure control, adjustment of the mA and/or kV according to patient size and/or use of iterative reconstruction technique. COMPARISON:  03/13/2022 FINDINGS: Brain: No evidence of acute infarction, hemorrhage, mass, mass  effect, or midline shift. No hydrocephalus or extra-axial fluid collection. Age related cerebral atrophy. Vascular: No hyperdense vessel. Skull: Normal. Negative for fracture or focal lesion. Sinuses/Orbits: Sequela of chronic left maxillary sinusitis with osseous thickening and mild mucosal thickening. Near complete opacification of the right maxillary sinus. Mucosal thickening in the posterior left ethmoid air cells. Bubbly fluid and air-fluid level in the right sphenoid sinus. Right lamina papyracea defect. Other: The mastoid air cells are well aerated. IMPRESSION: 1. No acute intracranial process. 2. Findings suggestive of acute on chronic sinusitis. Electronically Signed   By: Wiliam Ke M.D.   On: 07/03/2022 23:26   DG Chest Port 1 View  Result Date: 07/03/2022 CLINICAL DATA:  Questionable sepsis - evaluate for abnormality EXAM: PORTABLE CHEST 1 VIEW COMPARISON:  Chest x-ray 04/26/2022 FINDINGS: The heart and mediastinal contours are unchanged. Aortic calcification. Bibasilar atelectasis. No focal consolidation. No pulmonary edema. No pleural effusion. No pneumothorax. No acute osseous abnormality. IMPRESSION: 1. No active disease. 2.  Aortic Atherosclerosis (ICD10-I70.0). Electronically Signed   By: Tish Frederickson M.D.   On: 07/03/2022 22:44        Scheduled Meds:  Chlorhexidine Gluconate Cloth  6 each Topical Daily   mupirocin ointment  1 Application Nasal BID   mouth rinse  15 mL Mouth Rinse 4 times per day   pantoprazole (PROTONIX) IV  40 mg Intravenous Q12H   sodium hypochlorite   Irrigation Daily   Continuous Infusions:  ceFEPime (MAXIPIME) IV     lactated ringers 75 mL/hr at 07/05/22 0925   vancomycin Stopped (07/04/22 2342)     LOS: 1 day    Time spent: 35 minutes    Dorcas Carrow, MD [facility] Pager [phone]

## 2022-07-06 ENCOUNTER — Other Ambulatory Visit: Payer: Self-pay

## 2022-07-06 DIAGNOSIS — R6520 Severe sepsis without septic shock: Secondary | ICD-10-CM | POA: Diagnosis not present

## 2022-07-06 DIAGNOSIS — A419 Sepsis, unspecified organism: Secondary | ICD-10-CM | POA: Diagnosis not present

## 2022-07-06 LAB — URINE CULTURE: Culture: >=100000 — AB

## 2022-07-06 LAB — GLUCOSE, CAPILLARY: Glucose-Capillary: 86 mg/dL (ref 70–99)

## 2022-07-06 MED ORDER — HALOPERIDOL LACTATE 2 MG/ML PO CONC
0.5000 mg | ORAL | Status: DC | PRN
  Filled 2022-07-06: qty 5

## 2022-07-06 MED ORDER — OXYCODONE HCL 20 MG/ML PO CONC
5.0000 mg | ORAL | Status: DC | PRN
  Administered 2022-07-06: 5 mg via SUBLINGUAL
  Filled 2022-07-06: qty 0.5

## 2022-07-06 MED ORDER — ONDANSETRON HCL 4 MG/2ML IJ SOLN: 4.0000 mg | Freq: Four times a day (QID) | INTRAMUSCULAR | Status: DC | PRN

## 2022-07-06 MED ORDER — CHLORHEXIDINE GLUCONATE CLOTH 2 % EX PADS
6.0000 | MEDICATED_PAD | Freq: Every day | CUTANEOUS | Status: DC
  Administered 2022-07-06: 6 via TOPICAL

## 2022-07-06 MED ORDER — LORAZEPAM 2 MG/ML PO CONC
1.0000 mg | ORAL | Status: DC | PRN
  Filled 2022-07-06: qty 0.5

## 2022-07-06 MED ORDER — SODIUM CHLORIDE 0.9 % IV SOLN
1.0000 g | Freq: Two times a day (BID) | INTRAVENOUS | Status: DC
  Administered 2022-07-06: 1 g via INTRAVENOUS
  Filled 2022-07-06: qty 20

## 2022-07-06 MED ORDER — ACETAMINOPHEN 325 MG PO TABS: 650.0000 mg | ORAL_TABLET | Freq: Four times a day (QID) | ORAL | Status: DC | PRN

## 2022-07-06 MED ORDER — HYDROMORPHONE HCL 1 MG/ML IJ SOLN
0.5000 mg | INTRAMUSCULAR | Status: DC | PRN
  Administered 2022-07-07: 0.5 mg via INTRAVENOUS
  Filled 2022-07-06: qty 0.5

## 2022-07-06 MED ORDER — VALPROIC ACID 250 MG/5ML PO SOLN
250.0000 mg | Freq: Every day | ORAL | Status: DC
  Filled 2022-07-06: qty 5

## 2022-07-06 MED ORDER — POLYETHYLENE GLYCOL 3350 17 GM/SCOOP PO POWD
17.0000 g | Freq: Every day | ORAL | Status: DC
  Administered 2022-07-06: 17 g via ORAL
  Filled 2022-07-06: qty 255

## 2022-07-06 MED ORDER — GLYCOPYRROLATE 0.2 MG/ML IJ SOLN: 0.2000 mg | INTRAMUSCULAR | Status: DC | PRN

## 2022-07-06 MED ORDER — GLYCOPYRROLATE 1 MG PO TABS: 1.0000 mg | ORAL_TABLET | ORAL | Status: DC | PRN

## 2022-07-06 MED ORDER — RISPERIDONE 0.25 MG PO TABS
0.5000 mg | ORAL_TABLET | Freq: Every day | ORAL | Status: DC
  Administered 2022-07-06: 0.5 mg
  Filled 2022-07-06: qty 2

## 2022-07-06 MED ORDER — OXYCODONE HCL 20 MG/ML PO CONC: 5.0000 mg | ORAL | Status: DC | PRN

## 2022-07-06 MED ORDER — SODIUM CHLORIDE 0.9% FLUSH
3.0000 mL | Freq: Two times a day (BID) | INTRAVENOUS | Status: DC
  Administered 2022-07-06 – 2022-07-07 (×2): 3 mL via INTRAVENOUS

## 2022-07-06 MED ORDER — LORAZEPAM 2 MG/ML IJ SOLN
1.0000 mg | INTRAMUSCULAR | Status: DC | PRN
  Administered 2022-07-06: 1 mg via INTRAVENOUS
  Filled 2022-07-06: qty 1

## 2022-07-06 MED ORDER — HALOPERIDOL 0.5 MG PO TABS: 0.5000 mg | ORAL_TABLET | ORAL | Status: DC | PRN

## 2022-07-06 MED ORDER — POLYETHYLENE GLYCOL 3350 17 G PO PACK: 17.0000 g | PACK | Freq: Every day | ORAL | Status: DC

## 2022-07-06 MED ORDER — SODIUM CHLORIDE 0.9% FLUSH: 3.0000 mL | INTRAVENOUS | Status: DC | PRN

## 2022-07-06 MED ORDER — HALOPERIDOL LACTATE 5 MG/ML IJ SOLN: 0.5000 mg | INTRAMUSCULAR | Status: DC | PRN

## 2022-07-06 MED ORDER — SODIUM CHLORIDE 0.9 % IV SOLN: 250.0000 mL | INTRAVENOUS | Status: DC | PRN

## 2022-07-06 MED ORDER — ONDANSETRON 4 MG PO TBDP: 4.0000 mg | ORAL_TABLET | Freq: Four times a day (QID) | ORAL | Status: DC | PRN

## 2022-07-06 MED ORDER — ACETAMINOPHEN 650 MG RE SUPP: 650.0000 mg | Freq: Four times a day (QID) | RECTAL | Status: DC | PRN

## 2022-07-06 MED ORDER — LORAZEPAM 1 MG PO TABS: 1.0000 mg | ORAL_TABLET | ORAL | Status: DC | PRN

## 2022-07-06 NOTE — Progress Notes (Signed)
PROGRESS NOTE    Jack Rivas  XHB:[id number] DOB: 1945-09-24 DOA: 07/03/2022 PCP: Cyndia Skeeters, MD    Brief Narrative:  76 year old gentleman with history of severe malnutrition with dysphagia status post PEG tube, recurrent aspiration pneumonia, advanced dementia, CKD stage IIIb brought from nursing home with altered mental status, generalized weakness and fever.  Patient is enrolled into hospice at the nursing home but remained full code. Patient was admitted to hospital 2 months ago with similar symptoms and apparently sent to the nursing home with hospice care.  On presentation, temperature 103, blood pressure initially low responded to IV fluid resuscitation, hemoglobin was 6.1.  Lactic acid was 1.3. CT scan shows right lower lobe consolidation.  CT head no acute findings. Obviously infected stage IV decubitus ulcer.  Urinalysis was abnormal.  Admitted with severe sepsis possibly secondary to sacral decubitus ulcer, acute metabolic encephalopathy multifactorial, chronic blood loss anemia. Blood cultures negative. Urine cultures with ESBL gram-negative more than 100,000 colonies.  Remains in very poor clinical status.   Assessment & Plan:   Severe sepsis, multifactorial.  UTI and right lower lobe pneumonia. Blood cultures negative.  Urine culture with ESBL.  On cefepime and vancomycin.  Changed to meropenem today.  Once discharge planning are clear, will give 1 dose of fosfomycin.  Acute metabolic encephalopathy, multifactorial.  Underlying advanced dementia.  Remains persistently encephalopathic.  Chronic blood loss anemia.  Received 2 units of PRBC transfusion with appropriate response.  Acute kidney injury on chronic kidney disease stage IIIb.  Continues on maintenance IV fluids.  Goal of care discussion below.  Stage IV decubitus ulcer with infection: Local wound care.  Will not tolerate surgical intervention.  Goal of care: Medically recommended DNR by 2 physicians due to  futility of care. Advanced dementia, dysphagia status post PEG tube, currently not used.  Will hold off on starting tube feeding as comfort care discussion going on. Failure to thrive.  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 Call placed to legal guardian, waiting for callback. Called and updated both the patient and daughter, updated about patient's critical situation.  Both of the daughters agree to transfer patient to hospice only to [facility].  They would not like to go him back to nursing home. Hospice provider updated. DSS guardian will coordinate with family and if agreed, can transfer to inpatient hospice.     DVT prophylaxis: Place and maintain sequential compression device Start: 07/04/22 1535   Code Status: DNR/DNI Family Communication: Daughters on the phone. Disposition Plan: Status is: Inpatient Remains inpatient appropriate because: Critically ill     Consultants:  Palliative  Hospice  Procedures:  None  Antimicrobials:  Vancomycin and cefepime 12/18-    Subjective:  Seen and examined.  Looks quiet and calm.  Afebrile.  Does not respond.  Objective: Vitals:   07/06/22 0700 07/06/22 0800 07/06/22 0900 07/06/22 1000  BP: (!) 100/51 (!) 109/48 (!) 120/99 (!) 100/55  Pulse: 64 60 73 68  Resp:    13  Temp:  (!) 97.3 F (36.3 C)    TempSrc:  Axillary    SpO2: 100% 100% 100% 100%  Weight:      Height:        Intake/Output Summary (Last 24 hours) at 07/06/2022 1042 Last data filed at 07/06/2022 0816 Gross per 24 hour  Intake 1246.82 ml  Output 500 ml  Net 746.82 ml   Filed Weights   07/03/22 2200 07/03/22 2246 07/04/22 1400  Weight: 86.7 kg 86.7  kg 79.5 kg    Examination:  General: Frail and debilitated.  He does not respond.  He winces on abdominal exam. Cardiovascular: S1-S2 normal.  Tachycardic. Respiratory: Poor bilateral air entry.  Not in distress.  On room air. Gastrointestinal: Soft.  PEG tube in place.  Bowel sound  present.  No tenderness but winced on deep palpation. Ext: Cachectic extremities.  Does not follow commands.  No swelling or edema. Neuro: Does not follow any commands.  No spontaneous movements.  Looks comfortable. Skin: Stage IV decubitus ulcer in the sacrum, no active secretions.    Data Reviewed: I have personally reviewed following labs and imaging studies  CBC: Recent Labs  Lab 07/03/22 2315 07/03/22 2331 07/04/22 0426 07/04/22 1510 07/05/22 0259  WBC 12.1*  --  10.8*  --  8.5  NEUTROABS 8.9*  --   --   --  6.7  HGB 6.1* 7.1* 5.7* 7.1* 7.4*  HCT 21.1* 21.0* 18.9* 24.0* 25.0*  MCV 93.8  --  92.6  --  94.3  PLT 290  --  246  --  265   Basic Metabolic Panel: Recent Labs  Lab 07/03/22 2315 07/03/22 2331 07/04/22 0426 07/05/22 0259  NA 143 144  --  145  K 4.7 5.0  --  3.9  CL 108 108  --  112*  CO2 25  --   --  24  GLUCOSE 121* 117*  --  85  BUN 59* 62*  --  53*  CREATININE 3.46* 3.40* 2.54* 2.13*  CALCIUM 8.4*  --   --  8.0*  MG 2.2  --   --  2.0  PHOS  --   --   --  3.8   GFR: Estimated Creatinine Clearance: 33.2 mL/min (A) (by C-G formula based on SCr of 2.13 mg/dL (H)). Liver Function Tests: Recent Labs  Lab 07/03/22 2315 07/05/22 0259  AST 63* 27  ALT 46* 31  ALKPHOS 52 42  BILITOT 0.6 0.7  PROT 7.9 6.5  ALBUMIN 1.8* 1.7*   No results for input(s): "LIPASE", "AMYLASE" in the last 168 hours. No results for input(s): "AMMONIA" in the last 168 hours. Coagulation Profile: Recent Labs  Lab 07/03/22 2315  INR 1.4*   Cardiac Enzymes: No results for input(s): "CKTOTAL", "CKMB", "CKMBINDEX", "TROPONINI" in the last 168 hours. BNP (last 3 results) No results for input(s): "PROBNP" in the last 8760 hours. HbA1C: No results for input(s): "HGBA1C" in the last 72 hours. CBG: Recent Labs  Lab 07/04/22 1613  GLUCAP 97   Lipid Profile: No results for input(s): "CHOL", "HDL", "LDLCALC", "TRIG", "CHOLHDL", "LDLDIRECT" in the last 72 hours. Thyroid  Function Tests: No results for input(s): "TSH", "T4TOTAL", "FREET4", "T3FREE", "THYROIDAB" in the last 72 hours. Anemia Panel: No results for input(s): "VITAMINB12", "FOLATE", "FERRITIN", "TIBC", "IRON", "RETICCTPCT" in the last 72 hours. Sepsis Labs: Recent Labs  Lab 07/03/22 2315 07/04/22 0033  LATICACIDVEN 1.3 1.3    Recent Results (from the past 240 hour(s))  Resp panel by RT-PCR (RSV, Flu A&B, Covid) Anterior Nasal Swab     Status: None   Collection Time: 07/03/22 10:22 PM   Specimen: Anterior Nasal Swab  Result Value Ref Range Status   SARS Coronavirus 2 by RT PCR NEGATIVE NEGATIVE Final    Comment: (NOTE) SARS-CoV-2 target nucleic acids are NOT DETECTED.  The SARS-CoV-2 RNA is generally detectable in upper respiratory specimens during the acute phase of infection. The lowest concentration of SARS-CoV-2 viral copies this assay can detect is  138 copies/mL. A negative result does not preclude SARS-Cov-2 infection and should not be used as the sole basis for treatment or other patient management decisions. A negative result may occur with  improper specimen collection/handling, submission of specimen other than nasopharyngeal swab, presence of viral mutation(s) within the areas targeted by this assay, and inadequate number of viral copies(<138 copies/mL). A negative result must be combined with clinical observations, patient history, and epidemiological information. The expected result is Negative.  Fact Sheet for Patients:  BloggerCourse.com  Fact Sheet for Healthcare Providers:  SeriousBroker.it  This test is no t yet approved or cleared by the [place] FDA and  has been authorized for detection and/or diagnosis of SARS-CoV-2 by FDA under an Emergency Use Authorization (EUA). This EUA will remain  in effect (meaning this test can be used) for the duration of the COVID-19 declaration under Section 564(b)(1) of the  Act, 21 U.S.C.section 360bbb-3(b)(1), unless the authorization is terminated  or revoked sooner.       Influenza A by PCR NEGATIVE NEGATIVE Final   Influenza B by PCR NEGATIVE NEGATIVE Final    Comment: (NOTE) The Xpert Xpress SARS-CoV-2/FLU/RSV plus assay is intended as an aid in the diagnosis of influenza from Nasopharyngeal swab specimens and should not be used as a sole basis for treatment. Nasal washings and aspirates are unacceptable for Xpert Xpress SARS-CoV-2/FLU/RSV testing.  Fact Sheet for Patients: BloggerCourse.com  Fact Sheet for Healthcare Providers: SeriousBroker.it  This test is not yet approved or cleared by the [place] FDA and has been authorized for detection and/or diagnosis of SARS-CoV-2 by FDA under an Emergency Use Authorization (EUA). This EUA will remain in effect (meaning this test can be used) for the duration of the COVID-19 declaration under Section 564(b)(1) of the Act, 21 U.S.C. section 360bbb-3(b)(1), unless the authorization is terminated or revoked.     Resp Syncytial Virus by PCR NEGATIVE NEGATIVE Final    Comment: (NOTE) Fact Sheet for Patients: BloggerCourse.com  Fact Sheet for Healthcare Providers: SeriousBroker.it  This test is not yet approved or cleared by the [place] FDA and has been authorized for detection and/or diagnosis of SARS-CoV-2 by FDA under an Emergency Use Authorization (EUA). This EUA will remain in effect (meaning this test can be used) for the duration of the COVID-19 declaration under Section 564(b)(1) of the Act, 21 U.S.C. section 360bbb-3(b)(1), unless the authorization is terminated or revoked.  Performed at [facility], [address] [place], [place] [address]   Blood Culture (routine x 2)     Status: None (Preliminary result)   Collection Time: 07/03/22 11:15 PM   Specimen:  BLOOD  Result Value Ref Range Status   Specimen Description   Final    BLOOD SITE NOT SPECIFIED Performed at [facility], [address], [place], [place] [address]    Special Requests   Final    BOTTLES DRAWN AEROBIC AND ANAEROBIC Blood Culture results may not be optimal due to an inadequate volume of blood received in culture bottles Performed at [facility], [address] [place], [place] [address]    Culture   Final    NO GROWTH 2 DAYS Performed at [facility], [address] [place], [place] [address]    Report Status PENDING  Incomplete  Blood Culture (routine x 2)     Status: None (Preliminary result)   Collection Time: 07/04/22 12:33 AM   Specimen: BLOOD  Result Value Ref  Range Status   Specimen Description   Final    BLOOD BLOOD LEFT HAND Performed at [facility], [address], [place], [place] [address]    Special Requests   Final    BOTTLES DRAWN AEROBIC AND ANAEROBIC Blood Culture adequate volume Performed at [facility], [address] [place], [place] [address]    Culture   Final    NO GROWTH 2 DAYS Performed at [facility], [address], [place], [place] [address]    Report Status PENDING  Incomplete  Urine Culture     Status: Abnormal   Collection Time: 07/04/22  3:33 AM   Specimen: In/Out Cath Urine  Result Value Ref Range Status   Specimen Description   Final    IN/OUT CATH URINE Performed at [facility], [address], [place], [place] [address]    Special Requests   Final    NONE Performed at [facility], [address] [place], [place] [address]    Culture (A)  Final    >=100,000 COLONIES/mL ESCHERICHIA COLI Confirmed Extended Spectrum Beta-Lactamase Producer (ESBL).  In bloodstream infections from ESBL organisms, carbapenems are preferred over piperacillin/tazobactam. They are shown to have a lower risk of  mortality.    Report Status 07/06/2022 FINAL  Final   Organism ID, Bacteria ESCHERICHIA COLI (A)  Final      Susceptibility   Escherichia coli - MIC*    AMPICILLIN >=32 RESISTANT Resistant     CEFAZOLIN >=64 RESISTANT Resistant     CEFEPIME 16 RESISTANT Resistant     CEFTRIAXONE >=64 RESISTANT Resistant     CIPROFLOXACIN >=4 RESISTANT Resistant     GENTAMICIN >=16 RESISTANT Resistant     IMIPENEM <=0.25 SENSITIVE Sensitive     NITROFURANTOIN <=16 SENSITIVE Sensitive     TRIMETH/SULFA >=320 RESISTANT Resistant     AMPICILLIN/SULBACTAM >=32 RESISTANT Resistant     PIP/TAZO >=128 RESISTANT Resistant     * >=100,000 COLONIES/mL ESCHERICHIA COLI  MRSA Next Gen by PCR, Nasal     Status: Abnormal   Collection Time: 07/04/22  2:06 PM   Specimen: Nasal Mucosa; Nasal Swab  Result Value Ref Range Status   MRSA by PCR Next Gen DETECTED (A) NOT DETECTED Final    Comment: CRITICAL RESULT CALLED TO, READ BACK BY AND VERIFIED WITH: BURDICK,J AT 1704 ON 07/04/22 BY LUZOLOP (NOTE) The GeneXpert MRSA Assay (FDA approved for NASAL specimens only), is one component of a comprehensive MRSA colonization surveillance program. It is not intended to diagnose MRSA infection nor to guide or monitor treatment for MRSA infections. Test performance is not FDA approved in patients less than 33 years old. Performed at [facility], [address], [place], [place] [address]          Radiology Studies: No results found.      Scheduled Meds:  Chlorhexidine Gluconate Cloth  6 each Topical Q2200   mupirocin ointment  1 Application Nasal BID   mouth rinse  15 mL Mouth Rinse 4 times per day   pantoprazole (PROTONIX) IV  40 mg Intravenous Q12H   polyethylene glycol powder  17 g Oral Daily   risperiDONE  0.5 mg Per Tube QHS   sodium hypochlorite   Irrigation Daily   valproic acid  250 mg Per Tube QHS   Continuous Infusions:  meropenem (MERREM) IV       LOS: 2 days    Time  spent:  76 minutes    Barb Merino, MD [facility] Pager [phone]

## 2022-07-06 NOTE — Progress Notes (Signed)
Chaplain received a consult to provide support to patient and family.  Family was not at bedside and the patient was not alert.  Chaplain will attempt tomorrow.  Please page if urgent support needs arise over night.  [address] Bcc Pager, [phone]

## 2022-07-06 NOTE — Progress Notes (Signed)
Patient transferred via hospital bed to room 1610 by nurse and CNA. PT was given medications to keep him comfortable and transfer went smoothly.

## 2022-07-06 NOTE — IPAL (Signed)
  Interdisciplinary Goals of Care Family Meeting   Date carried out: 07/06/2022  Location of the meeting: Bedside  Member's involved: Physician, Family Member or next of kin, and Other: hospice liaison and  daughters . DSS guardian on the phone.   Durable Power of Insurance risk surveyor: DSS guardian and family agreeable     Discussion: We discussed goals of care for [employer] .  Multiple discussions about plan of care for Mr. Hankerson today.  Able to coordinate with his legal guardian, discussed and updated patient's daughter on the phone and later made Mr. [name] at the bedside.  Legal guardian along with family decided that patient will be best served with comfort care and hospice. Change to comfort care in the hospital.  Transferred to MedSurg bed. Report to [facility] for inpatient hospice. Stop antibiotics.  Can use PEG tube for medications. Adequate pain medications and anxiety medications for symptom control. Unrestricted visitor policy. RN can pronounce death if happens in the hospital. Anticipate transfer to inpatient hospice tomorrow if bed available.  Code status:   Code Status: DNR   Disposition: In-patient comfort care  Time spent for the meeting: 35 minutes    Dorcas Carrow, MD  07/06/2022, 1:45 PM

## 2022-07-06 NOTE — Progress Notes (Addendum)
[facility]) Hospital Liaison Note    Mr. 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 Patient arrived at the [facility] ED via EMS on 12.18 from [place] with Altered Mental status weakness, and fevers. EMS found patient to be hypotensive and was given IV fluid bolus. Evaluated in the ED, Hemoglobin was 6.1, given 2 units of blood, CT of chest showed RLL consolidation concerning for pneumonia. Was admitted on 12.19 for severe sepsis possibly secondary to sacral decubitus ulcer and started on IV antibiotics. Per Dr. Anne Fu, [facility] MD, this is a related hospital admission.   Visited patient and daughters at bedside. I did get DSS [place] on the phone and all was agreeable to transfer to [facility] for full comfort measures. Daughters are writing letter to email to DSS for confirmation. Patient is alert but unable to converse. Patient will be full comfort measures at this point going further. This liaison is working on getting him to [facility], looks like a bed will be available tomorrow 12/22. BP remains soft.  Patient meets inpatient level of care due to treatment of sepsis with IVF and IV antibiotics.   VS:107/50, 96.9T, 58P, 100NC3L  I&O: 772.4/950  Labs:    Latest Reference Range & Units 07/05/22 02:59  COMPREHENSIVE METABOLIC PANEL  Rpt !  Sodium 135 - 145 mmol/L 145  Potassium 3.5 - 5.1 mmol/L 3.9  Chloride 98 - 111 mmol/L 112 (H)  CO2 22 - 32 mmol/L 24  Glucose 70 - 99 mg/dL 85  BUN 8 - 23 mg/dL 53 (H)  Creatinine 0.93 - 1.24 mg/dL 8.18 (H)  Calcium 8.9 - 10.3 mg/dL 8.0 (L)  Anion gap 5 - 15  9  Phosphorus 2.5 - 4.6 mg/dL 3.8  Magnesium 1.7 - 2.4 mg/dL 2.0  Alkaline Phosphatase 38 - 126 U/L 42  Albumin 3.5 - 5.0 g/dL 1.7 (L)  AST 15 - 41 U/L 27  ALT 0 - 44 U/L 31  Total Protein 6.5 - 8.1 g/dL 6.5  Total Bilirubin 0.3 - 1.2 mg/dL 0.7  GFR, Estimated  >29 mL/min 31 (L)  WBC 4.0 - 10.5 K/uL 8.5  RBC 4.22 - 5.81 MIL/uL 2.65 (L)  Hemoglobin 13.0 - 17.0 g/dL 7.4 (L)  HCT 93.7 - 16.9 % 25.0 (L)  MCV 80.0 - 100.0 fL 94.3  MCH 26.0 - 34.0 pg 27.9  MCHC 30.0 - 36.0 g/dL 67.8 (L)  RDW 93.8 - 10.1 % 16.3 (H)  Platelets 150 - 400 K/uL 265  nRBC 0.0 - 0.2 % 0.0  Neutrophils % 80  Lymphocytes % 12  Monocytes Relative % 7  Eosinophil % 1  Basophil % 0  Immature Granulocytes % 0  NEUT# 1.7 - 7.7 K/uL 6.7  Lymphocyte # 0.7 - 4.0 K/uL 1.0  Monocyte # 0.1 - 1.0 K/uL 0.6  Eosinophils Absolute 0.0 - 0.5 K/uL 0.1  Basophils Absolute 0.0 - 0.1 K/uL 0.0  Abs Immature Granulocytes 0.00 - 0.07 K/uL 0.03  !: Data is abnormal (H): Data is abnormally high (L): Data is abnormally low  Diagnostics: none today  IV/PRN:  ceFEPIme (MAXIPIME) 2 g in sodium chloride 0.9 % 100 mL IVPB Dose: 2 g Freq: Every 12 hours Route: IV  meropenem (MERREM) 1 g in sodium chloride 0.9 % 100 mL IVPB Dose: 1 g Freq: Every 12 hours Route: IV  vancomycin (VANCOREADY) IVPB 750 mg/150 mL Dose: 750 mg  Freq: Every 24 hours Route: IV Last Dose: Stopped (07/06/22 0149   Problem List: Severe sepsis, multifactorial.  UTI and right lower lobe pneumonia. Blood cultures negative.  Urine culture with ESBL.  On cefepime and vancomycin.  Changed to meropenem today.  Once discharge planning are clear, will give 1 dose of fosfomycin.   Acute metabolic encephalopathy, multifactorial.  Underlying advanced dementia.  Remains persistently encephalopathic.   Chronic blood loss anemia.  Received 2 units of PRBC transfusion with appropriate response.   Acute kidney injury on chronic kidney disease stage IIIb.  Continues on maintenance IV fluids.  Goal of care discussion below.   Stage IV decubitus ulcer with infection: Local wound care.  Will not tolerate surgical intervention.   GOC: Clear. DNR DC planning: DSS and daughters agree to [facility] Family: updated at bedside IDT:  Updated  Should patient discharge. Please use [facility] for transport as they are contracted with [facility] active hospice patients.   Yolande Jolly, DNP, [facility] [phone]

## 2022-07-07 DIAGNOSIS — N390 Urinary tract infection, site not specified: Secondary | ICD-10-CM | POA: Insufficient documentation

## 2022-07-07 DIAGNOSIS — A498 Other bacterial infections of unspecified site: Secondary | ICD-10-CM | POA: Insufficient documentation

## 2022-07-07 DIAGNOSIS — L8990 Pressure ulcer of unspecified site, unspecified stage: Secondary | ICD-10-CM | POA: Insufficient documentation

## 2022-07-07 DIAGNOSIS — R6520 Severe sepsis without septic shock: Secondary | ICD-10-CM | POA: Diagnosis not present

## 2022-07-07 DIAGNOSIS — R627 Adult failure to thrive: Secondary | ICD-10-CM | POA: Insufficient documentation

## 2022-07-07 DIAGNOSIS — A419 Sepsis, unspecified organism: Secondary | ICD-10-CM | POA: Diagnosis not present

## 2022-07-07 NOTE — TOC Transition Note (Signed)
Transition of Care [facility]) - CM/SW Discharge Note   Patient Details  Name: Jack Rivas MRN: [id number] Date of Birth: 10-Apr-1946  Transition of Care [facility]) CM/SW Contact:  Beckie Busing, RN Phone Number:[phone]  07/07/2022, 12:08 PM   Clinical Narrative:    [facility] called for transport. D/c packet is at nurses station with DNR form included. No other TOC needs noted. TOC will sign off.          Patient Goals and CMS Choice      Discharge Placement                         Discharge Plan and Services Additional resources added to the After Visit Summary for                                       Social Determinants of Health (SDOH) Interventions SDOH Screenings   Depression (PHQ2-9): Low Risk  (03/15/2022)  Tobacco Use: High Risk (04/26/2022)     Readmission Risk Interventions    04/27/2022    8:29 AM 03/28/2021    3:34 PM  Readmission Risk Prevention Plan  Transportation Screening Complete Complete  PCP or Specialist Appt within 3-5 Days  Complete  HRI or Home Care Consult  Complete  Social Work Consult for Recovery Care Planning/Counseling  Complete  Palliative Care Screening  Not Applicable  Medication Review Oceanographer) Complete Complete  PCP or Specialist appointment within 3-5 days of discharge Complete   HRI or Home Care Consult Complete   SW Recovery Care/Counseling Consult Complete   Palliative Care Screening Complete   Skilled Nursing Facility Complete

## 2022-07-07 NOTE — Discharge Summary (Signed)
Physician Discharge Summary  Jack Rivas XBJ:[id number] DOB: October 30, 1945 DOA: 07/03/2022  PCP: Cyndia Skeeters, MD  Admit date: 07/03/2022 Discharge date: 07/07/2022  Disposition:  Residential hospice CODE STATUS: DNR   Brief/Interim Summary: Jack Rivas is a 76 year old gentleman with history of severe malnutrition with dysphagia status post PEG tube, recurrent aspiration pneumonia, advanced dementia, CKD stage IIIb brought from nursing home with altered mental status, generalized weakness and fever.  Patient is enrolled into hospice at the nursing home but remained full code. Patient was admitted to hospital 2 months ago with similar symptoms and apparently sent to the nursing home with hospice care.  On presentation, temperature 103, blood pressure initially low responded to IV fluid resuscitation, hemoglobin was 6.1.  Lactic acid was 1.3. CT scan shows right lower lobe consolidation.  CT head no acute findings. Obviously infected stage IV decubitus ulcer.  Urinalysis was abnormal.  Admitted with severe sepsis possibly secondary to sacral decubitus ulcer, acute metabolic encephalopathy multifactorial, chronic blood loss anemia. Blood cultures negative. Urine cultures with ESBL gram-negative more than 100,000 colonies.  Remains in very poor clinical status. Patient had very poor clinical progress. He had advanced dementia and failure to thrive. After conversation with hospitalist team, DSS, and care givers, patient was transitioned to comfort care and discharged to residential hospice facility.   Discharge Diagnoses:   Principal Problem:   Severe sepsis (HCC) Active Problems:   Dementia (HCC)   Acute metabolic encephalopathy   Anemia of chronic disease   Acute kidney injury superimposed on chronic kidney disease (HCC)   CAP (community acquired pneumonia)   Protein-calorie malnutrition, severe   Pressure injury of skin   Infection due to ESBL-producing Escherichia coli   Sepsis secondary to  UTI (HCC)   FTT (failure to thrive) in adult    In agreement with assessment of the pressure ulcer as below:  Pressure Injury 07/04/22 Sacrum Left;Mid Unstageable - Full thickness tissue loss in which the base of the injury is covered by slough (yellow, tan, gray, green or brown) and/or eschar (tan, brown or black) in the wound bed. 9cmX11cm with 1cm tunneling (Active)  07/04/22 1412  Location: Sacrum  Location Orientation: Left;Mid  Staging: Unstageable - Full thickness tissue loss in which the base of the injury is covered by slough (yellow, tan, gray, green or brown) and/or eschar (tan, brown or black) in the wound bed.  Wound Description (Comments): 9cmX11cm with 1cm tunneling  Present on Admission: Yes  Dressing Type Foam - Lift dressing to assess site every shift 07/06/22 0000     Pressure Injury 07/04/22 Heel Left Deep Tissue Pressure Injury - Purple or maroon localized area of discolored intact skin or blood-filled blister due to damage of underlying soft tissue from pressure and/or shear. (Active)  07/04/22 1416  Location: Heel  Location Orientation: Left  Staging: Deep Tissue Pressure Injury - Purple or maroon localized area of discolored intact skin or blood-filled blister due to damage of underlying soft tissue from pressure and/or shear.  Wound Description (Comments):   Present on Admission: Yes  Dressing Type Other (Comment) 07/06/22 0000     Pressure Injury 07/04/22 Heel Left;Lateral Stage 1 -  Intact skin with non-blanchable redness of a localized area usually over a bony prominence. (Active)  07/04/22 1417  Location: Heel  Location Orientation: Left;Lateral  Staging: Stage 1 -  Intact skin with non-blanchable redness of a localized area usually over a bony prominence.  Wound Description (Comments):   Present on Admission: Yes  Dressing Type None 07/06/22 0000     Pressure Injury 07/04/22 Toe (Comment  which one) Anterior;Left Stage 1 -  Intact skin with  non-blanchable redness of a localized area usually over a bony prominence. (Active)  07/04/22 1418  Location: Toe (Comment  which one)  Location Orientation: Anterior;Left  Staging: Stage 1 -  Intact skin with non-blanchable redness of a localized area usually over a bony prominence.  Wound Description (Comments):   Present on Admission: Yes  Dressing Type None 07/05/22 1217     Pressure Injury 07/04/22 Toe (Comment  which one) Anterior;Left Stage 1 -  Intact skin with non-blanchable redness of a localized area usually over a bony prominence. (Active)  07/04/22 1420  Location: Toe (Comment  which one)  Location Orientation: Anterior;Left  Staging: Stage 1 -  Intact skin with non-blanchable redness of a localized area usually over a bony prominence.  Wound Description (Comments):   Present on Admission: Yes  Dressing Type Foam - Lift dressing to assess site every shift 07/06/22 0000     Pressure Injury 07/04/22 Foot Right;Posterior Deep Tissue Pressure Injury - Purple or maroon localized area of discolored intact skin or blood-filled blister due to damage of underlying soft tissue from pressure and/or shear. (Active)  07/04/22 1421  Location: Foot  Location Orientation: Right;Posterior  Staging: Deep Tissue Pressure Injury - Purple or maroon localized area of discolored intact skin or blood-filled blister due to damage of underlying soft tissue from pressure and/or shear.  Wound Description (Comments):   Present on Admission: Yes  Dressing Type Foam - Lift dressing to assess site every shift 07/05/22 1217     Pressure Injury 07/04/22 Heel Right;Lateral Stage 1 -  Intact skin with non-blanchable redness of a localized area usually over a bony prominence. (Active)  07/04/22 1422  Location: Heel  Location Orientation: Right;Lateral  Staging: Stage 1 -  Intact skin with non-blanchable redness of a localized area usually over a bony prominence.  Wound Description (Comments):   Present on  Admission: Yes  Dressing Type Foam - Lift dressing to assess site every shift 07/06/22 0000     Pressure Injury 07/04/22 Toe (Comment  which one) Anterior;Right Stage 1 -  Intact skin with non-blanchable redness of a localized area usually over a bony prominence. (Active)  07/04/22 1423  Location: Toe (Comment  which one)  Location Orientation: Anterior;Right  Staging: Stage 1 -  Intact skin with non-blanchable redness of a localized area usually over a bony prominence.  Wound Description (Comments):   Present on Admission: Yes  Dressing Type None 07/05/22 1217     Pressure Injury 07/04/22 Foot Anterior;Right;Lateral Stage 1 -  Intact skin with non-blanchable redness of a localized area usually over a bony prominence. (Active)  07/04/22 1424  Location: Foot  Location Orientation: Anterior;Right;Lateral  Staging: Stage 1 -  Intact skin with non-blanchable redness of a localized area usually over a bony prominence.  Wound Description (Comments):   Present on Admission: Yes  Dressing Type Foam - Lift dressing to assess site every shift 07/06/22 0000     Pressure Injury 07/04/22 Foot Anterior;Right;Lateral;Lower Stage 1 -  Intact skin with non-blanchable redness of a localized area usually over a bony prominence. (Active)  07/04/22 1424  Location: Foot  Location Orientation: Anterior;Right;Lateral;Lower  Staging: Stage 1 -  Intact skin with non-blanchable redness of a localized area usually over a bony prominence.  Wound Description (Comments):   Present on Admission: Yes  Dressing Type None 07/05/22  1217    Nutrition Problem: Severe Malnutrition Etiology: chronic illness  Discharge Instructions  Discharge Instructions     No wound care   Complete by: As directed       Allergies as of 07/07/2022   No Known Allergies      Medication List     STOP taking these medications    acetaminophen 160 MG/5ML elixir Commonly known as: TYLENOL   acetaminophen 500 MG  tablet Commonly known as: TYLENOL   albuterol 1.25 MG/3ML nebulizer solution Commonly known as: ACCUNEB   atropine 1 % ophthalmic solution   diclofenac Sodium 1 % Gel Commonly known as: Voltaren   haloperidol 1 MG tablet Commonly known as: HALDOL   levalbuterol 0.63 MG/3ML nebulizer solution Commonly known as: XOPENEX   morphine 20 MG/ML concentrated solution Commonly known as: ROXANOL   nicotine 14 mg/24hr patch Commonly known as: NICODERM CQ - dosed in mg/24 hours   ondansetron 4 MG tablet Commonly known as: ZOFRAN   polyethylene glycol powder 17 GM/SCOOP powder Commonly known as: GLYCOLAX/MIRALAX   umeclidinium bromide 62.5 MCG/ACT Aepb Commonly known as: INCRUSE ELLIPTA       TAKE these medications    risperiDONE 0.5 MG tablet Commonly known as: RISPERDAL Place 1 tablet (0.5 mg total) into feeding tube at bedtime.   valproic acid 250 MG/5ML solution Commonly known as: DEPAKENE Place 5 mLs (250 mg total) into feeding tube at bedtime.        No Known Allergies    Procedures/Studies: CT CHEST ABDOMEN PELVIS WO CONTRAST  Result Date: 07/04/2022 CLINICAL DATA:  Sepsis, fever.  Decreased appetite and output. EXAM: CT CHEST, ABDOMEN AND PELVIS WITHOUT CONTRAST TECHNIQUE: Multidetector CT imaging of the chest, abdomen and pelvis was performed following the standard protocol without IV contrast. RADIATION DOSE REDUCTION: This exam was performed according to the departmental dose-optimization program which includes automated exposure control, adjustment of the mA and/or kV according to patient size and/or use of iterative reconstruction technique. COMPARISON:  03/13/2022. FINDINGS: CT CHEST FINDINGS Cardiovascular: The heart is normal in size and there is a small pericardial effusion. Coronary artery calcification is noted. 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 The pulmonary trunk is  normal in caliber. Mediastinum/Nodes: No mediastinal or axillary lymphadenopathy. Evaluation of the hila is limited due to lack of IV contrast. The thyroid gland, trachea, and esophagus are within normal limits. Lungs/Pleura: Consolidation is present in the right lower lobe. No effusion or pneumothorax. Musculoskeletal: Shoulder arthroplasty changes are noted on the left. Degenerative changes are noted in the thoracic spine and right glenohumeral joint. A coarse calcification is seen in the subclavian region on the left and likely chronic. CT ABDOMEN PELVIS FINDINGS Hepatobiliary: No focal liver abnormality is seen. No biliary ductal dilatation. The gallbladder is not seen. Pancreas: Unremarkable. No pancreatic ductal dilatation or surrounding inflammatory changes. Spleen: Normal in size without focal abnormality. Adrenals/Urinary Tract: Adrenal glands are unremarkable. Kidneys are normal, without renal calculi, focal lesion, or hydronephrosis. Bladder is unremarkable. Stomach/Bowel: NG tube is noted in the stomach. Appendix appears normal. No evidence of bowel wall thickening, distention, or inflammatory changes. No free air or pneumatosis. Scattered diverticula along the colon without evidence of diverticulitis. A moderate amount of retained stool in the colon. Vascular/Lymphatic: Aortic atherosclerosis. No enlarged abdominal or pelvic lymph nodes. Reproductive: Prostate is unremarkable. Other: No abdominopelvic ascites. Musculoskeletal: Degenerative changes in the lumbar spine and bilateral hips. 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 A stable compression deformity is noted in the superior endplate at L1. No acute fracture. IMPRESSION: 1. Right lower lobe consolidation, concerning for pneumonia. 2. Moderate amount of retained stool in the colon suggesting constipation. 3. Aortic atherosclerosis. 4. Remaining chronic findings as described above.  Electronically Signed   By: Thornell Sartorius M.D.   On: 07/04/2022 01:41   CT Head Wo Contrast  Result Date: 07/03/2022 CLINICAL DATA:  Mental status change EXAM: CT HEAD WITHOUT CONTRAST TECHNIQUE: Contiguous axial images were obtained from the base of the skull through the vertex without intravenous contrast. RADIATION DOSE REDUCTION: This exam was performed according to the departmental dose-optimization program which includes automated exposure control, adjustment of the mA and/or kV according to patient size and/or use of iterative reconstruction technique. COMPARISON:  03/13/2022 FINDINGS: Brain: No evidence of acute infarction, hemorrhage, mass, mass effect, or midline shift. No hydrocephalus or extra-axial fluid collection. Age related cerebral atrophy. Vascular: No hyperdense vessel. Skull: Normal. Negative for fracture or focal lesion. Sinuses/Orbits: Sequela of chronic left maxillary sinusitis with osseous thickening and mild mucosal thickening. Near complete opacification of the right maxillary sinus. Mucosal thickening in the posterior left ethmoid air cells. Bubbly fluid and air-fluid level in the right sphenoid sinus. Right lamina papyracea defect. Other: The mastoid air cells are well aerated. IMPRESSION: 1. No acute intracranial process. 2. Findings suggestive of acute on chronic sinusitis. Electronically Signed   By: Wiliam Ke M.D.   On: 07/03/2022 23:26   DG Chest Port 1 View  Result Date: 07/03/2022 CLINICAL DATA:  Questionable sepsis - evaluate for abnormality EXAM: PORTABLE CHEST 1 VIEW COMPARISON:  Chest x-ray 04/26/2022 FINDINGS: The heart and mediastinal contours are unchanged. Aortic calcification. Bibasilar atelectasis. No focal consolidation. No pulmonary edema. No pleural effusion. No pneumothorax. No acute osseous abnormality. IMPRESSION: 1. No active disease. 2.  Aortic Atherosclerosis (ICD10-I70.0). Electronically Signed   By: Tish Frederickson M.D.   On: 07/03/2022 22:44        Discharge Exam: Vitals:   07/07/22 0510 07/07/22 0559  BP: (!) 107/59   Pulse: 63 64  Resp: 14   Temp: (!) 97.4 F (36.3 C)   SpO2: (!) 74% 97%    General: Pt in no acute distress    The results of significant diagnostics from this hospitalization (including imaging, microbiology, ancillary and laboratory) are listed below for reference.     Microbiology: Recent Results (from the past 240 hour(s))  Resp panel by RT-PCR (RSV, Flu A&B, Covid) Anterior Nasal Swab     Status: None   Collection Time: 07/03/22 10:22 PM   Specimen: Anterior Nasal Swab  Result Value Ref Range Status   SARS Coronavirus 2 by RT PCR NEGATIVE NEGATIVE Final    Comment: (NOTE) SARS-CoV-2 target nucleic acids are NOT DETECTED.  The SARS-CoV-2 RNA is generally detectable in upper respiratory specimens during the acute phase of infection. The lowest concentration of SARS-CoV-2 viral copies this assay can detect is 138 copies/mL. A negative result does not preclude SARS-Cov-2 infection and should not be used as the sole basis for treatment or other patient management decisions. A negative result may occur with  improper specimen collection/handling, submission of specimen other than nasopharyngeal swab, presence of viral mutation(s) within the areas targeted by this assay, and inadequate number of viral copies(<138 copies/mL). A negative result must be combined with clinical observations, patient history, and epidemiological information. The expected result is  Negative.  Fact Sheet for Patients:  BloggerCourse.com  Fact Sheet for Healthcare Providers:  SeriousBroker.it  This test is no t yet approved or cleared by the [place] FDA and  has been authorized for detection and/or diagnosis of SARS-CoV-2 by FDA under an Emergency Use Authorization (EUA). This EUA will remain  in effect (meaning this test can be used) for the duration of  the COVID-19 declaration under Section 564(b)(1) of the Act, 21 U.S.C.section 360bbb-3(b)(1), unless the authorization is terminated  or revoked sooner.       Influenza A by PCR NEGATIVE NEGATIVE Final   Influenza B by PCR NEGATIVE NEGATIVE Final    Comment: (NOTE) The Xpert Xpress SARS-CoV-2/FLU/RSV plus assay is intended as an aid in the diagnosis of influenza from Nasopharyngeal swab specimens and should not be used as a sole basis for treatment. Nasal washings and aspirates are unacceptable for Xpert Xpress SARS-CoV-2/FLU/RSV testing.  Fact Sheet for Patients: BloggerCourse.com  Fact Sheet for Healthcare Providers: SeriousBroker.it  This test is not yet approved or cleared by the [place] FDA and has been authorized for detection and/or diagnosis of SARS-CoV-2 by FDA under an Emergency Use Authorization (EUA). This EUA will remain in effect (meaning this test can be used) for the duration of the COVID-19 declaration under Section 564(b)(1) of the Act, 21 U.S.C. section 360bbb-3(b)(1), unless the authorization is terminated or revoked.     Resp Syncytial Virus by PCR NEGATIVE NEGATIVE Final    Comment: (NOTE) Fact Sheet for Patients: BloggerCourse.com  Fact Sheet for Healthcare Providers: SeriousBroker.it  This test is not yet approved or cleared by the [place] FDA and has been authorized for detection and/or diagnosis of SARS-CoV-2 by FDA under an Emergency Use Authorization (EUA). This EUA will remain in effect (meaning this test can be used) for the duration of the COVID-19 declaration under Section 564(b)(1) of the Act, 21 U.S.C. section 360bbb-3(b)(1), unless the authorization is terminated or revoked.  Performed at [facility], [address] [place], [place] [address]   Blood Culture (routine x 2)     Status: None (Preliminary  result)   Collection Time: 07/03/22 11:15 PM   Specimen: BLOOD  Result Value Ref Range Status   Specimen Description   Final    BLOOD SITE NOT SPECIFIED Performed at [facility], [address] [place], [place] [address]    Special Requests   Final    BOTTLES DRAWN AEROBIC AND ANAEROBIC Blood Culture results may not be optimal due to an inadequate volume of blood received in culture bottles Performed at [facility], [address] [place], [place] [address]    Culture   Final    NO GROWTH 3 DAYS Performed at [facility], [address], [place], [place] [address]    Report Status PENDING  Incomplete  Blood Culture (routine x 2)     Status: None (Preliminary result)   Collection Time: 07/04/22 12:33 AM   Specimen: BLOOD  Result Value Ref Range Status   Specimen Description   Final    BLOOD BLOOD LEFT HAND Performed at [facility], [address], [place], [place] [address]    Special Requests   Final    BOTTLES DRAWN AEROBIC AND ANAEROBIC Blood Culture adequate volume Performed at [facility], [address] [place], [place] [address]    Culture   Final    NO GROWTH 3 DAYS Performed at [facility], [address], [place], [place] [address]    Report Status PENDING  Incomplete  Urine Culture     Status: Abnormal   Collection Time: 07/04/22  3:33 AM   Specimen: In/Out Cath Urine  Result Value Ref Range Status   Specimen Description   Final    IN/OUT CATH URINE Performed at [facility], [address] [place], [place] [address]    Special Requests   Final    NONE Performed at [facility], [address] [place], [place] [address]    Culture (A)  Final    >=100,000 COLONIES/mL ESCHERICHIA COLI Confirmed Extended Spectrum Beta-Lactamase Producer (ESBL).  In bloodstream infections from ESBL organisms, carbapenems are preferred over  piperacillin/tazobactam. They are shown to have a lower risk of mortality.    Report Status 07/06/2022 FINAL  Final   Organism ID, Bacteria ESCHERICHIA COLI (A)  Final      Susceptibility   Escherichia coli - MIC*    AMPICILLIN >=32 RESISTANT Resistant     CEFAZOLIN >=64 RESISTANT Resistant     CEFEPIME 16 RESISTANT Resistant     CEFTRIAXONE >=64 RESISTANT Resistant     CIPROFLOXACIN >=4 RESISTANT Resistant     GENTAMICIN >=16 RESISTANT Resistant     IMIPENEM <=0.25 SENSITIVE Sensitive     NITROFURANTOIN <=16 SENSITIVE Sensitive     TRIMETH/SULFA >=320 RESISTANT Resistant     AMPICILLIN/SULBACTAM >=32 RESISTANT Resistant     PIP/TAZO >=128 RESISTANT Resistant     * >=100,000 COLONIES/mL ESCHERICHIA COLI  MRSA Next Gen by PCR, Nasal     Status: Abnormal   Collection Time: 07/04/22  2:06 PM   Specimen: Nasal Mucosa; Nasal Swab  Result Value Ref Range Status   MRSA by PCR Next Gen DETECTED (A) NOT DETECTED Final    Comment: CRITICAL RESULT CALLED TO, READ BACK BY AND VERIFIED WITH: BURDICK,J AT 1704 ON 07/04/22 BY LUZOLOP (NOTE) The GeneXpert MRSA Assay (FDA approved for NASAL specimens only), is one component of a comprehensive MRSA colonization surveillance program. It is not intended to diagnose MRSA infection nor to guide or monitor treatment for MRSA infections. Test performance is not FDA approved in patients less than 49 years old. Performed at [facility], [address] [place], [place] [address]      Labs: BNP (last 3 results) No results for input(s): "BNP" in the last 8760 hours. Basic Metabolic Panel: Recent Labs  Lab 07/03/22 2315 07/03/22 2331 07/04/22 0426 07/05/22 0259  NA 143 144  --  145  K 4.7 5.0  --  3.9  CL 108 108  --  112*  CO2 25  --   --  24  GLUCOSE 121* 117*  --  85  BUN 59* 62*  --  53*  CREATININE 3.46* 3.40* 2.54* 2.13*  CALCIUM 8.4*  --   --  8.0*  MG 2.2  --   --  2.0  PHOS  --   --   --  3.8   Liver Function  Tests: Recent Labs  Lab 07/03/22 2315 07/05/22 0259  AST 63* 27  ALT 46* 31  ALKPHOS 52 42  BILITOT 0.6 0.7  PROT 7.9 6.5  ALBUMIN 1.8* 1.7*   No results for input(s): "LIPASE", "AMYLASE" in the last 168 hours. No results for input(s): "AMMONIA" in the last 168 hours. CBC: Recent Labs  Lab 07/03/22 2315 07/03/22 2331 07/04/22 0426 07/04/22 1510 07/05/22 0259  WBC  12.1*  --  10.8*  --  8.5  NEUTROABS 8.9*  --   --   --  6.7  HGB 6.1* 7.1* 5.7* 7.1* 7.4*  HCT 21.1* 21.0* 18.9* 24.0* 25.0*  MCV 93.8  --  92.6  --  94.3  PLT 290  --  246  --  265   Cardiac Enzymes: No results for input(s): "CKTOTAL", "CKMB", "CKMBINDEX", "TROPONINI" in the last 168 hours. BNP: Invalid input(s): "POCBNP" CBG: Recent Labs  Lab 07/04/22 1613 07/06/22 1108  GLUCAP 97 86   D-Dimer No results for input(s): "DDIMER" in the last 72 hours. Hgb A1c No results for input(s): "HGBA1C" in the last 72 hours. Lipid Profile No results for input(s): "CHOL", "HDL", "LDLCALC", "TRIG", "CHOLHDL", "LDLDIRECT" in the last 72 hours. Thyroid function studies No results for input(s): "TSH", "T4TOTAL", "T3FREE", "THYROIDAB" in the last 72 hours.  Invalid input(s): "FREET3" Anemia work up No results for input(s): "VITAMINB12", "FOLATE", "FERRITIN", "TIBC", "IRON", "RETICCTPCT" in the last 72 hours. Urinalysis    Component Value Date/Time   COLORURINE YELLOW 07/04/2022 0333   APPEARANCEUR CLOUDY (A) 07/04/2022 0333   LABSPEC 1.011 07/04/2022 0333   PHURINE 6.0 07/04/2022 0333   GLUCOSEU NEGATIVE 07/04/2022 0333   HGBUR LARGE (A) 07/04/2022 0333   BILIRUBINUR NEGATIVE 07/04/2022 0333   KETONESUR 5 (A) 07/04/2022 0333   PROTEINUR 100 (A) 07/04/2022 0333   NITRITE NEGATIVE 07/04/2022 0333   LEUKOCYTESUR LARGE (A) 07/04/2022 0333   Sepsis Labs Recent Labs  Lab 07/03/22 2315 07/04/22 0426 07/05/22 0259  WBC 12.1* 10.8* 8.5   Microbiology Recent Results (from the past 240 hour(s))  Resp panel by  RT-PCR (RSV, Flu A&B, Covid) Anterior Nasal Swab     Status: None   Collection Time: 07/03/22 10:22 PM   Specimen: Anterior Nasal Swab  Result Value Ref Range Status   SARS Coronavirus 2 by RT PCR NEGATIVE NEGATIVE Final    Comment: (NOTE) SARS-CoV-2 target nucleic acids are NOT DETECTED.  The SARS-CoV-2 RNA is generally detectable in upper respiratory specimens during the acute phase of infection. The lowest concentration of SARS-CoV-2 viral copies this assay can detect is 138 copies/mL. A negative result does not preclude SARS-Cov-2 infection and should not be used as the sole basis for treatment or other patient management decisions. A negative result may occur with  improper specimen collection/handling, submission of specimen other than nasopharyngeal swab, presence of viral mutation(s) within the areas targeted by this assay, and inadequate number of viral copies(<138 copies/mL). A negative result must be combined with clinical observations, patient history, and epidemiological information. The expected result is Negative.  Fact Sheet for Patients:  BloggerCourse.comhttps://www.fda.gov/media/152166/download  Fact Sheet for Healthcare Providers:  SeriousBroker.ithttps://www.fda.gov/media/152162/download  This test is no t yet approved or cleared by the [place] FDA and  has been authorized for detection and/or diagnosis of SARS-CoV-2 by FDA under an Emergency Use Authorization (EUA). This EUA will remain  in effect (meaning this test can be used) for the duration of the COVID-19 declaration under Section 564(b)(1) of the Act, 21 U.S.C.section 360bbb-3(b)(1), unless the authorization is terminated  or revoked sooner.       Influenza A by PCR NEGATIVE NEGATIVE Final   Influenza B by PCR NEGATIVE NEGATIVE Final    Comment: (NOTE) The Xpert Xpress SARS-CoV-2/FLU/RSV plus assay is intended as an aid in the diagnosis of influenza from Nasopharyngeal swab specimens and should not be used as a sole basis  for treatment. Nasal washings and aspirates are  unacceptable for Xpert Xpress SARS-CoV-2/FLU/RSV testing.  Fact Sheet for Patients: BloggerCourse.com  Fact Sheet for Healthcare Providers: SeriousBroker.it  This test is not yet approved or cleared by the [place] FDA and has been authorized for detection and/or diagnosis of SARS-CoV-2 by FDA under an Emergency Use Authorization (EUA). This EUA will remain in effect (meaning this test can be used) for the duration of the COVID-19 declaration under Section 564(b)(1) of the Act, 21 U.S.C. section 360bbb-3(b)(1), unless the authorization is terminated or revoked.     Resp Syncytial Virus by PCR NEGATIVE NEGATIVE Final    Comment: (NOTE) Fact Sheet for Patients: BloggerCourse.com  Fact Sheet for Healthcare Providers: SeriousBroker.it  This test is not yet approved or cleared by the [place] FDA and has been authorized for detection and/or diagnosis of SARS-CoV-2 by FDA under an Emergency Use Authorization (EUA). This EUA will remain in effect (meaning this test can be used) for the duration of the COVID-19 declaration under Section 564(b)(1) of the Act, 21 U.S.C. section 360bbb-3(b)(1), unless the authorization is terminated or revoked.  Performed at [facility], [address], [place], [place] [address]   Blood Culture (routine x 2)     Status: None (Preliminary result)   Collection Time: 07/03/22 11:15 PM   Specimen: BLOOD  Result Value Ref Range Status   Specimen Description   Final    BLOOD SITE NOT SPECIFIED Performed at [facility], [address] [place], [place] [address]    Special Requests   Final    BOTTLES DRAWN AEROBIC AND ANAEROBIC Blood Culture results may not be optimal due to an inadequate volume of blood received in culture bottles Performed at [facility], [address] [place], [place] [address]    Culture   Final    NO GROWTH 3 DAYS Performed at [facility], [address], [place], [place] [address]    Report Status PENDING  Incomplete  Blood Culture (routine x 2)     Status: None (Preliminary result)   Collection Time: 07/04/22 12:33 AM   Specimen: BLOOD  Result Value Ref Range Status   Specimen Description   Final    BLOOD BLOOD LEFT HAND Performed at [facility], [address] [place], [place] [address]    Special Requests   Final    BOTTLES DRAWN AEROBIC AND ANAEROBIC Blood Culture adequate volume Performed at [facility], [address], [place], [place] [address]    Culture   Final    NO GROWTH 3 DAYS Performed at [facility], [address], [place], [place] [address]    Report Status PENDING  Incomplete  Urine Culture     Status: Abnormal   Collection Time: 07/04/22  3:33 AM   Specimen: In/Out Cath Urine  Result Value Ref Range Status   Specimen Description   Final    IN/OUT CATH URINE Performed at [facility], [address], [place], [place] [address]    Special Requests   Final    NONE Performed at [facility], [address], [place], [place] [address]    Culture (A)  Final    >=100,000 COLONIES/mL ESCHERICHIA COLI Confirmed Extended Spectrum Beta-Lactamase Producer (ESBL).  In bloodstream infections from ESBL organisms, carbapenems are preferred over piperacillin/tazobactam. They are shown to have a lower risk of mortality.    Report Status 07/06/2022 FINAL  Final   Organism  ID, Bacteria ESCHERICHIA COLI (A)  Final      Susceptibility   Escherichia coli - MIC*    AMPICILLIN >=32 RESISTANT Resistant     CEFAZOLIN >=64 RESISTANT Resistant     CEFEPIME 16 RESISTANT Resistant     CEFTRIAXONE >=64 RESISTANT Resistant     CIPROFLOXACIN >=4 RESISTANT Resistant     GENTAMICIN >=16 RESISTANT  Resistant     IMIPENEM <=0.25 SENSITIVE Sensitive     NITROFURANTOIN <=16 SENSITIVE Sensitive     TRIMETH/SULFA >=320 RESISTANT Resistant     AMPICILLIN/SULBACTAM >=32 RESISTANT Resistant     PIP/TAZO >=128 RESISTANT Resistant     * >=100,000 COLONIES/mL ESCHERICHIA COLI  MRSA Next Gen by PCR, Nasal     Status: Abnormal   Collection Time: 07/04/22  2:06 PM   Specimen: Nasal Mucosa; Nasal Swab  Result Value Ref Range Status   MRSA by PCR Next Gen DETECTED (A) NOT DETECTED Final    Comment: CRITICAL RESULT CALLED TO, READ BACK BY AND VERIFIED WITH: BURDICK,J AT 1704 ON 07/04/22 BY LUZOLOP (NOTE) The GeneXpert MRSA Assay (FDA approved for NASAL specimens only), is one component of a comprehensive MRSA colonization surveillance program. It is not intended to diagnose MRSA infection nor to guide or monitor treatment for MRSA infections. Test performance is not FDA approved in patients less than 32 years old. Performed at [facility], [address] [place], [place] [address]      Patient was seen and examined on the day of discharge and was found to be in stable condition. Time coordinating discharge: 35 minutes including assessment and coordination of care, as well as examination of the patient.   SIGNED:  Noralee Stain, DO [facility] 07/07/2022, 11:03 AM

## 2022-07-07 NOTE — Plan of Care (Signed)
Alert and oriented to self only, follows some commands, incomprehensible speech mostly.  VSS throughout shift.  All meds given on time as ordered.  NAPs pain score zero.  Oral care provided with suctioning.  Male purewick changed.  POC maintained, will continue to monitor.  Problem: Education: Goal: Knowledge of General Education information will improve Description: Including pain rating scale, medication(s)/side effects and non-pharmacologic comfort measures Outcome: Progressing   Problem: Health Behavior/Discharge Planning: Goal: Ability to manage health-related needs will improve Outcome: Progressing   Problem: Clinical Measurements: Goal: Ability to maintain clinical measurements within normal limits will improve Outcome: Progressing Goal: Will remain free from infection Outcome: Progressing Goal: Diagnostic test results will improve Outcome: Progressing Goal: Respiratory complications will improve Outcome: Progressing Goal: Cardiovascular complication will be avoided Outcome: Progressing   Problem: Activity: Goal: Risk for activity intolerance will decrease Outcome: Progressing   Problem: Nutrition: Goal: Adequate nutrition will be maintained Outcome: Progressing   Problem: Coping: Goal: Level of anxiety will decrease Outcome: Progressing   Problem: Elimination: Goal: Will not experience complications related to bowel motility Outcome: Progressing Goal: Will not experience complications related to urinary retention Outcome: Progressing   Problem: Pain Managment: Goal: General experience of comfort will improve Outcome: Progressing   Problem: Safety: Goal: Ability to remain free from injury will improve Outcome: Progressing   Problem: Skin Integrity: Goal: Risk for impaired skin integrity will decrease Outcome: Progressing   Problem: Education: Goal: Knowledge of the prescribed therapeutic regimen will improve Outcome: Progressing   Problem:  Coping: Goal: Ability to identify and develop effective coping behavior will improve Outcome: Progressing   Problem: Clinical Measurements: Goal: Quality of life will improve Outcome: Progressing   Problem: Respiratory: Goal: Verbalizations of increased ease of respirations will increase Outcome: Progressing   Problem: Role Relationship: Goal: Family's ability to cope with current situation will improve Outcome: Progressing Goal: Ability to verbalize concerns, feelings, and thoughts to partner or family member will improve Outcome: Progressing   Problem: Pain Management: Goal: Satisfaction with pain management regimen will improve Outcome: Progressing

## 2022-07-07 NOTE — Progress Notes (Signed)
[facility] [address] [employer] [address]) Hospital Liaison Note   [name] has been approved to be moved to [facility], per Hospice MD.   Bed is available, Family and DSS Guardian are agreeable to transfer today.    Hospital and TOC aware.  RN, please call report to [phone] prior to patient leaving the unit. Please send signed and completed DNR with patient at discharge.   Thank you,  Lamount Cranker RN  [facility] Liaison [phone]

## 2022-07-09 LAB — CULTURE, BLOOD (ROUTINE X 2)
Culture: NO GROWTH
Culture: NO GROWTH
Special Requests: ADEQUATE

## 2022-10-22 IMAGING — RF DG SWALLOWING FUNCTION
12 of 14 series · 12 of 24 positions shown · non-contrast
Comparison: March 25, 2021.

CLINICAL DATA: Dysphagia.

EXAM:
MODIFIED BARIUM SWALLOW
TECHNIQUE: Different consistencies of barium were administered orally to the
patient by the Speech Pathologist. Imaging of the pharynx was
performed in the lateral projection. The radiology NP was present in
the fluoroscopy room for this study, providing personal supervision.
FLUOROSCOPY:
Radiation Exposure Index (if provided by the fluoroscopic device):
13 mGy air kerma

[Series 1: run · 1 of 16 frames shown (1 of 12)]
[frame 9/16]
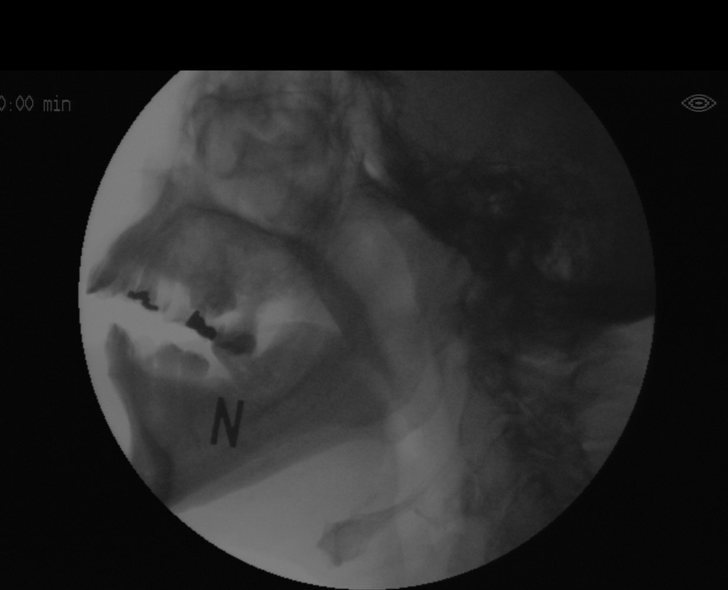

[Series 2: run · 1 of 414 frames shown (2 of 12)]
[frame 352/414]
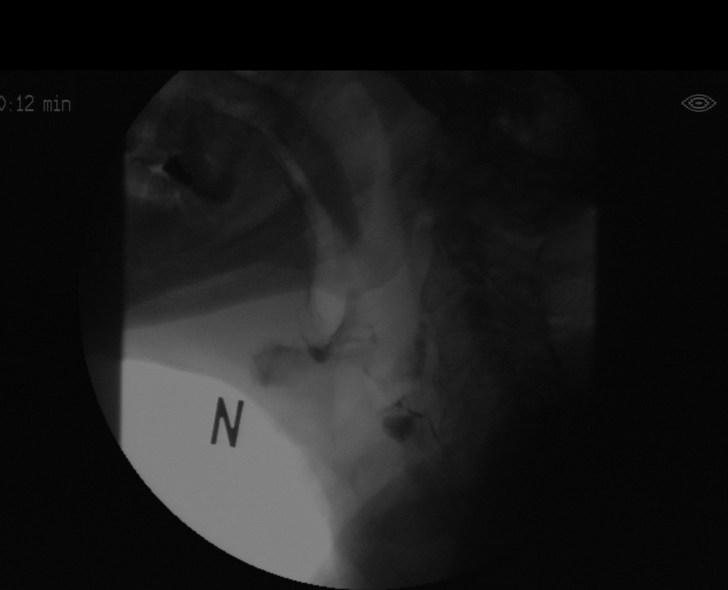

[Series 4: run · 1 of 367 frames shown (3 of 12)]
[frame 56/367]
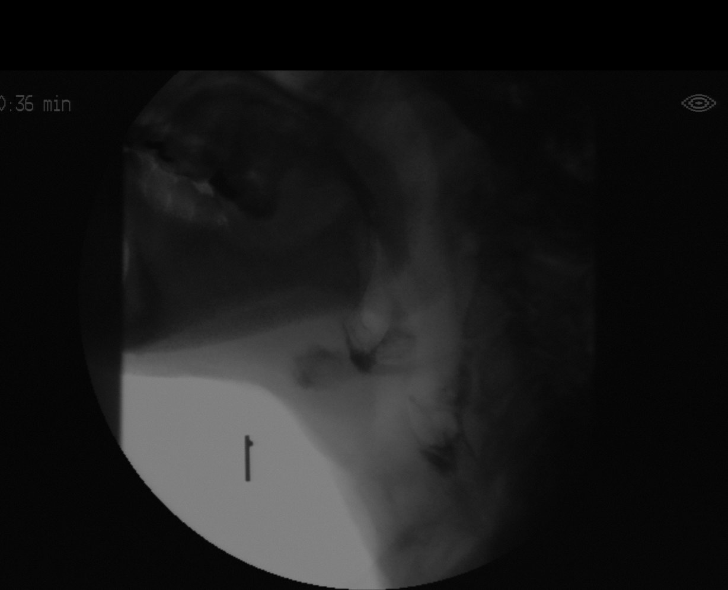

[Series 5: run · 1 of 24 frames shown (4 of 12)]
[frame 4/24]
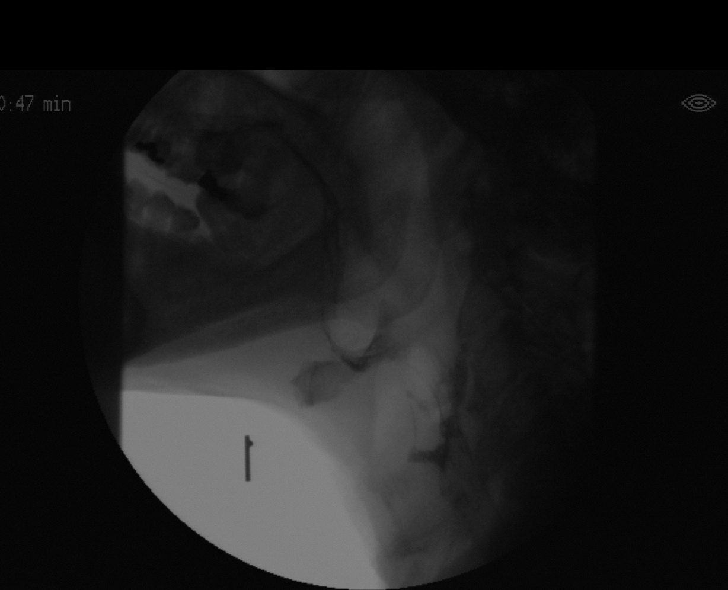

[Series 6: run · 1 of 358 frames shown (5 of 12)]
[frame 263/358]
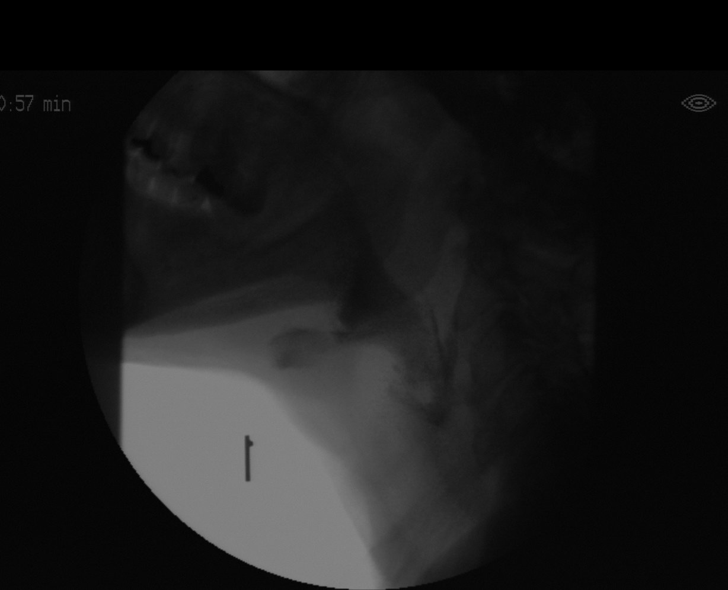

[Series 7: run · 1 of 286 frames shown (6 of 12)]
[frame 144/286]
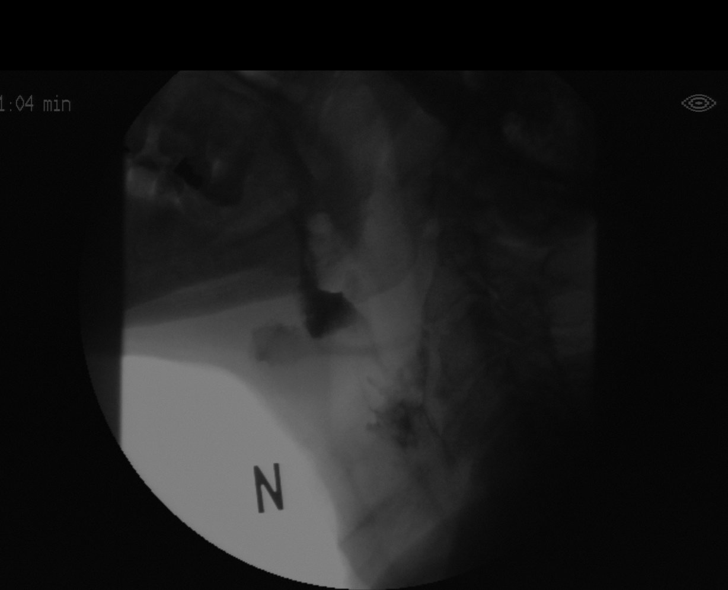

[Series 8: run · 1 of 598 frames shown (7 of 12)]
[frame 524/598]
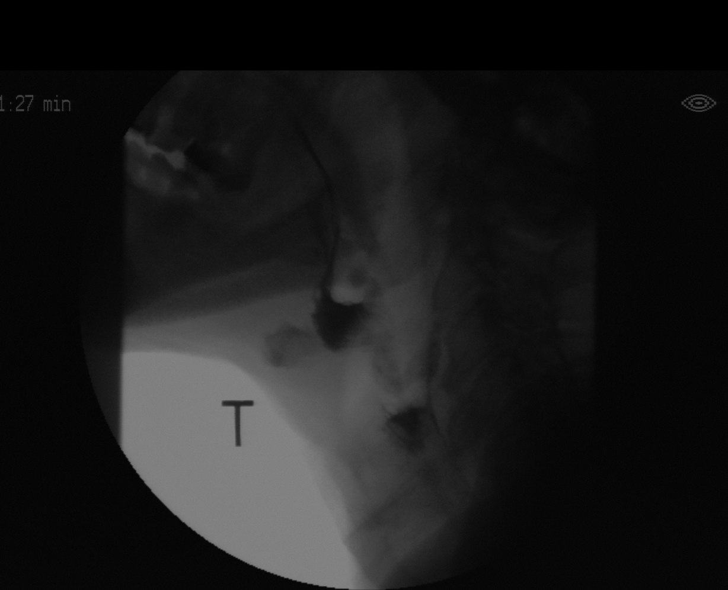

[Series 10: run · 1 of 40 frames shown (8 of 12)]
[frame 7/40]
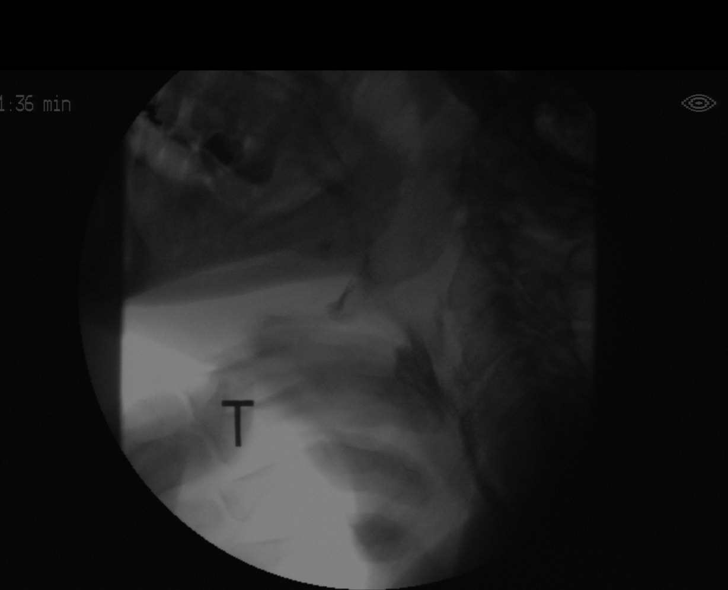

[Series 11: run · 1 of 123 frames shown (9 of 12)]
[frame 19/123]
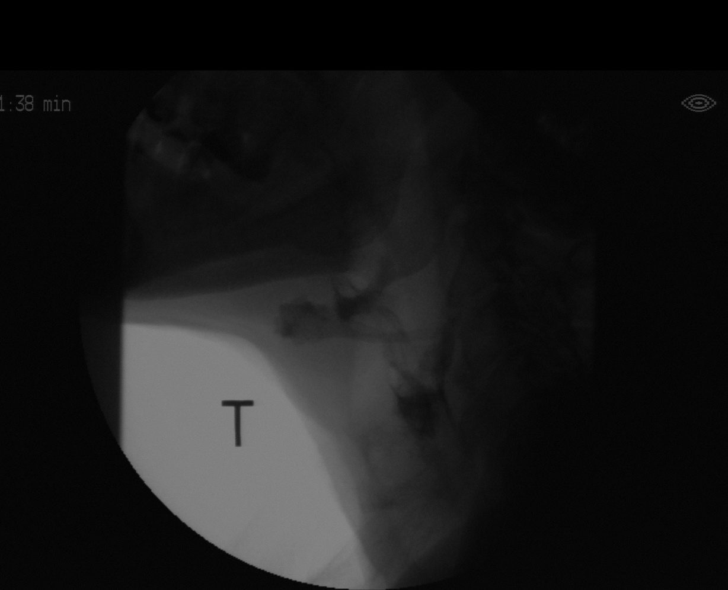

[Series 12: run · 1 of 106 frames shown (10 of 12)]
[frame 16/106]
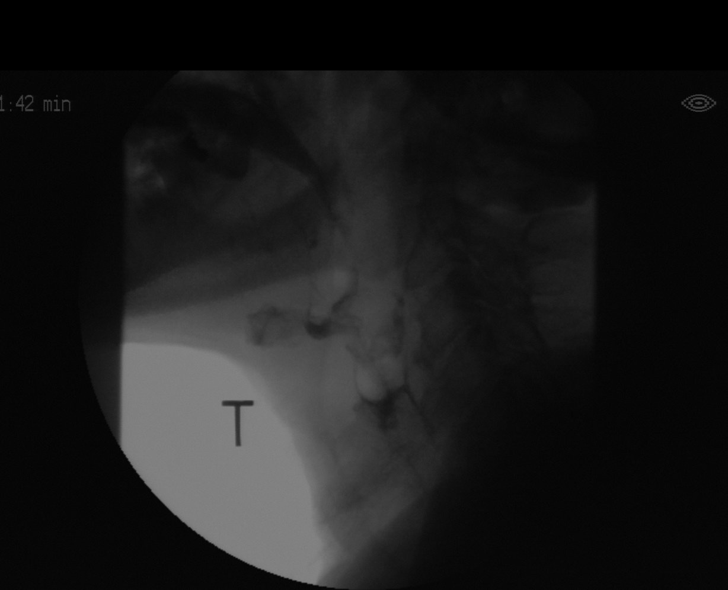

[Series 13: run · 1 of 196 frames shown (11 of 12)]
[frame 99/196]
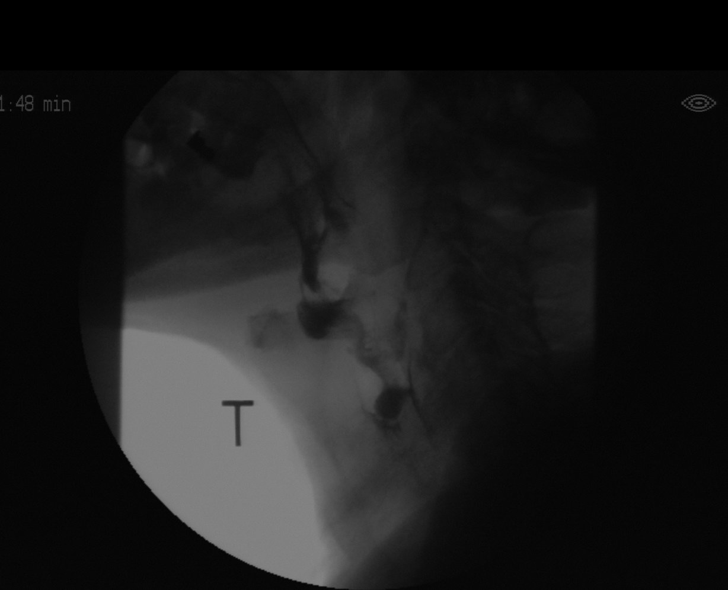

[Series 14: run · 1 of 280 frames shown (12 of 12)]
[frame 239/280]
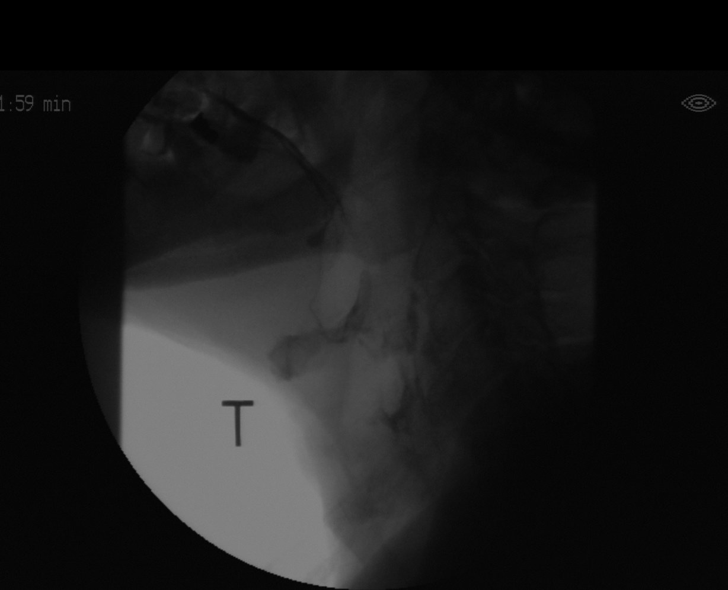

[12 of 24 positions shown; findings below may reference images not displayed]

FINDINGS: Global delayed transit and swallow initiation. Single trace
penetration with thin liquids. No aspiration.
IMPRESSION: No aspiration.

Please refer to the Speech Pathologists report for complete details
and recommendations.
